# Patient Record
Sex: Male | Born: 1985
Health system: Southern US, Community
[De-identification: ages and names within clinical notes are randomized; demographics above are authoritative.]

## PROBLEM LIST (undated history)

## (undated) DIAGNOSIS — R7303 Prediabetes: Secondary | ICD-10-CM

## (undated) DIAGNOSIS — I10 Essential (primary) hypertension: Secondary | ICD-10-CM

## (undated) DIAGNOSIS — F17200 Nicotine dependence, unspecified, uncomplicated: Secondary | ICD-10-CM

## (undated) DIAGNOSIS — G4730 Sleep apnea, unspecified: Secondary | ICD-10-CM

## (undated) DIAGNOSIS — E119 Type 2 diabetes mellitus without complications: Secondary | ICD-10-CM

## (undated) DIAGNOSIS — E785 Hyperlipidemia, unspecified: Secondary | ICD-10-CM

## (undated) DIAGNOSIS — I4891 Unspecified atrial fibrillation: Secondary | ICD-10-CM

## (undated) DIAGNOSIS — J301 Allergic rhinitis due to pollen: Secondary | ICD-10-CM

## (undated) DIAGNOSIS — R7301 Impaired fasting glucose: Secondary | ICD-10-CM

## (undated) DIAGNOSIS — G4733 Obstructive sleep apnea (adult) (pediatric): Secondary | ICD-10-CM

## (undated) DIAGNOSIS — E669 Obesity, unspecified: Secondary | ICD-10-CM

## (undated) DIAGNOSIS — J329 Chronic sinusitis, unspecified: Secondary | ICD-10-CM

## (undated) DIAGNOSIS — F32A Depression, unspecified: Secondary | ICD-10-CM

## (undated) DIAGNOSIS — F329 Major depressive disorder, single episode, unspecified: Secondary | ICD-10-CM

## (undated) DIAGNOSIS — S022XXA Fracture of nasal bones, initial encounter for closed fracture: Secondary | ICD-10-CM

## (undated) DIAGNOSIS — B1920 Unspecified viral hepatitis C without hepatic coma: Secondary | ICD-10-CM

## (undated) DIAGNOSIS — J309 Allergic rhinitis, unspecified: Secondary | ICD-10-CM

## (undated) DIAGNOSIS — R040 Epistaxis: Secondary | ICD-10-CM

## (undated) DIAGNOSIS — R519 Headache, unspecified: Secondary | ICD-10-CM

## (undated) HISTORY — DX: Hyperlipidemia, unspecified: E78.5

## (undated) HISTORY — DX: Type 2 diabetes mellitus without complications: E11.9

## (undated) HISTORY — DX: Obesity, unspecified: E66.9

## (undated) HISTORY — DX: Unspecified atrial fibrillation: I48.91

## (undated) HISTORY — DX: Allergic rhinitis due to pollen: J30.1

## (undated) HISTORY — PX: BREAST SURGERY: SHX581

## (undated) HISTORY — DX: Nicotine dependence, unspecified, uncomplicated: F17.200

## (undated) HISTORY — DX: Essential (primary) hypertension: I10

## (undated) HISTORY — DX: Impaired fasting glucose: R73.01

## (undated) HISTORY — DX: Sleep apnea, unspecified: G47.30

## (undated) HISTORY — DX: Obstructive sleep apnea (adult) (pediatric): G47.33

## (undated) HISTORY — DX: Depression, unspecified: F32.A

## (undated) HISTORY — DX: Chronic sinusitis, unspecified: J32.9

## (undated) HISTORY — PX: HX HAND SURGERY: 2100001299

## (undated) HISTORY — DX: Headache, unspecified: R51.9

## (undated) HISTORY — DX: Allergic rhinitis, unspecified: J30.9

## (undated) HISTORY — DX: Fracture of nasal bones, initial encounter for closed fracture: S02.2XXA

## (undated) HISTORY — DX: Epistaxis: R04.0

---
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
## 2007-05-25 ENCOUNTER — Emergency Department (HOSPITAL_COMMUNITY): Admission: EM | Admit: 2007-05-25 | Discharge: 2007-05-25 | Payer: Self-pay | Admitting: Emergency Medicine

## 2007-08-04 ENCOUNTER — Ambulatory Visit: Payer: Self-pay | Admitting: Family Medicine

## 2007-10-17 ENCOUNTER — Ambulatory Visit: Payer: Self-pay | Admitting: Family Medicine

## 2007-11-03 ENCOUNTER — Ambulatory Visit: Payer: Self-pay | Admitting: Family Medicine

## 2007-12-05 ENCOUNTER — Ambulatory Visit: Payer: Self-pay | Admitting: Family Medicine

## 2008-02-21 ENCOUNTER — Ambulatory Visit: Payer: Self-pay | Admitting: Family Medicine

## 2008-03-19 ENCOUNTER — Ambulatory Visit: Payer: Self-pay | Admitting: Family Medicine

## 2008-05-17 ENCOUNTER — Ambulatory Visit: Payer: Self-pay | Admitting: Family Medicine

## 2008-07-10 ENCOUNTER — Ambulatory Visit: Payer: Self-pay | Admitting: Family Medicine

## 2008-07-15 ENCOUNTER — Ambulatory Visit: Payer: Self-pay | Admitting: Family Medicine

## 2008-08-28 ENCOUNTER — Ambulatory Visit: Payer: Self-pay | Admitting: Family Medicine

## 2008-12-06 ENCOUNTER — Ambulatory Visit: Payer: Self-pay | Admitting: Family Medicine

## 2009-01-30 ENCOUNTER — Ambulatory Visit: Payer: Self-pay | Admitting: Family Medicine

## 2009-04-18 ENCOUNTER — Ambulatory Visit: Payer: Self-pay | Admitting: Family Medicine

## 2009-07-03 ENCOUNTER — Ambulatory Visit: Payer: Self-pay | Admitting: Family Medicine

## 2009-07-09 ENCOUNTER — Ambulatory Visit: Payer: Self-pay | Admitting: Family Medicine

## 2009-11-06 ENCOUNTER — Ambulatory Visit: Payer: Self-pay | Admitting: Family Medicine

## 2009-12-19 ENCOUNTER — Ambulatory Visit: Payer: Self-pay | Admitting: Family Medicine

## 2010-01-23 ENCOUNTER — Ambulatory Visit
Admission: RE | Admit: 2010-01-23 | Discharge: 2010-01-23 | Payer: Self-pay | Source: Home / Self Care | Attending: Family Medicine | Admitting: Family Medicine

## 2010-02-24 ENCOUNTER — Ambulatory Visit (INDEPENDENT_AMBULATORY_CARE_PROVIDER_SITE_OTHER): Payer: 59 | Admitting: Family Medicine

## 2010-02-24 DIAGNOSIS — I10 Essential (primary) hypertension: Secondary | ICD-10-CM

## 2010-02-24 DIAGNOSIS — Z79899 Other long term (current) drug therapy: Secondary | ICD-10-CM

## 2010-03-25 ENCOUNTER — Ambulatory Visit: Payer: 59 | Admitting: Family Medicine

## 2010-03-26 ENCOUNTER — Ambulatory Visit (INDEPENDENT_AMBULATORY_CARE_PROVIDER_SITE_OTHER): Payer: 59 | Admitting: Family Medicine

## 2010-03-26 DIAGNOSIS — E669 Obesity, unspecified: Secondary | ICD-10-CM

## 2010-03-26 DIAGNOSIS — I10 Essential (primary) hypertension: Secondary | ICD-10-CM

## 2010-03-26 DIAGNOSIS — Z79899 Other long term (current) drug therapy: Secondary | ICD-10-CM

## 2010-05-01 ENCOUNTER — Encounter: Payer: Self-pay | Admitting: Family Medicine

## 2010-05-19 ENCOUNTER — Other Ambulatory Visit: Payer: Self-pay

## 2010-05-19 NOTE — Telephone Encounter (Signed)
Called [facility] 20/12.5 #30 with 5 refills

## 2010-05-25 ENCOUNTER — Ambulatory Visit (INDEPENDENT_AMBULATORY_CARE_PROVIDER_SITE_OTHER): Payer: 59 | Admitting: Family Medicine

## 2010-05-25 ENCOUNTER — Encounter: Payer: Self-pay | Admitting: Family Medicine

## 2010-05-25 DIAGNOSIS — E785 Hyperlipidemia, unspecified: Secondary | ICD-10-CM

## 2010-05-25 DIAGNOSIS — E6609 Other obesity due to excess calories: Secondary | ICD-10-CM

## 2010-05-25 DIAGNOSIS — I10 Essential (primary) hypertension: Secondary | ICD-10-CM

## 2010-05-25 DIAGNOSIS — E669 Obesity, unspecified: Secondary | ICD-10-CM

## 2010-05-25 DIAGNOSIS — I152 Hypertension secondary to endocrine disorders: Secondary | ICD-10-CM | POA: Insufficient documentation

## 2010-05-25 HISTORY — DX: Hyperlipidemia, unspecified: E78.5

## 2010-05-25 MED ORDER — FLUTICASONE PROPIONATE 50 MCG/ACT NA SUSP: 2.0000 | Freq: Every day | NASAL | Status: DC

## 2010-05-25 MED ORDER — NEBIVOLOL HCL 20 MG PO TABS: 2.0000 | ORAL_TABLET | ORAL | Status: DC

## 2010-05-25 NOTE — Progress Notes (Signed)
  Subjective:    Patient ID: Jesse Duncan, male    DOB: 1985/07/24, 25 y.o.   MRN: [id number]  HPI he is here for a blood pressure check. He also complains of a one-month history of nasal congestion and runny nose. No sneezing, itchy watery eyes or runny nose. No earache, sore throat, fever or chills. He has been using an over-the-counter nasal spray. He does get this every year but usually has other symptoms with it.    Review of Systems     Objective:   Physical Exam  Constitutional: He appears well-developed and well-nourished.  HENT:  Head: Normocephalic and atraumatic.  Right Ear: External ear normal.  Left Ear: External ear normal.  Nose: Nose normal.  Eyes: Conjunctivae and EOM are normal. Pupils are equal, round, and reactive to light. Left eye exhibits no discharge. No scleral icterus.  Neck: Neck supple. No thyromegaly present.  Cardiovascular: Normal rate, regular rhythm and normal heart sounds.  Exam reveals no gallop and no friction rub.   No murmur heard. Pulmonary/Chest: Effort normal and breath sounds normal.  Lymphadenopathy:    He has no cervical adenopathy.          Assessment & Plan:  Hypertension. Allergic rhinitis. Increase Bystolic to 40 mg. He is to stop using the over-the-counter nasal decongestant and switch to Flonase. Recheck blood pressure here in one month.

## 2010-05-25 NOTE — Patient Instructions (Addendum)
Take 2 of the Bystolic per day and recheck here in one month. Stop using the over-the-counter nasal spray and switch to Flonase.

## 2010-07-06 ENCOUNTER — Ambulatory Visit (INDEPENDENT_AMBULATORY_CARE_PROVIDER_SITE_OTHER): Payer: 59 | Admitting: Family Medicine

## 2010-07-06 ENCOUNTER — Encounter: Payer: Self-pay | Admitting: Family Medicine

## 2010-07-06 VITALS — BP 128/86 | HR 72 | Wt 300.0 lb

## 2010-07-06 DIAGNOSIS — J301 Allergic rhinitis due to pollen: Secondary | ICD-10-CM

## 2010-07-06 DIAGNOSIS — Z209 Contact with and (suspected) exposure to unspecified communicable disease: Secondary | ICD-10-CM

## 2010-07-06 DIAGNOSIS — I10 Essential (primary) hypertension: Secondary | ICD-10-CM

## 2010-07-06 DIAGNOSIS — E785 Hyperlipidemia, unspecified: Secondary | ICD-10-CM

## 2010-07-06 HISTORY — DX: Allergic rhinitis due to pollen: J30.1

## 2010-07-06 MED ORDER — LISINOPRIL-HYDROCHLOROTHIAZIDE 20-12.5 MG PO TABS: 1.0000 | ORAL_TABLET | Freq: Every day | ORAL | Status: DC

## 2010-07-06 MED ORDER — AMLODIPINE BESYLATE 10 MG PO TABS: 10.0000 mg | ORAL_TABLET | Freq: Every day | ORAL | Status: DC

## 2010-07-06 MED ORDER — FLUTICASONE PROPIONATE 50 MCG/ACT NA SUSP: 2.0000 | Freq: Every day | NASAL | Status: DC

## 2010-07-06 MED ORDER — PRAVASTATIN SODIUM 40 MG PO TABS: 40.0000 mg | ORAL_TABLET | Freq: Every day | ORAL | Status: DC

## 2010-07-06 NOTE — Progress Notes (Signed)
  Subjective:    Patient ID: Jesse Duncan, male    DOB: [birth year], 25 y.o.   MRN: [id number]  HPI He is here for a recheck. He continues on medications listed in the chart and does need several of them renewed. He continues to work on his weight but has been relatively unsuccessful with this. He also has concern over STD and states that on one occasion he did not use a condom. He continues on his allergy meds but does not use this regularly.  Review of Systems     Objective:   Physical Exam    alert and in no distress. Blood pressure is recorded.    Assessment & Plan:  Hypertension. Obesity. Allergic rhinitis. Concern for STD. I will check an HIV at his request. He will continue on his present medications. Several of them were called in. He will followup with me in roughly 6 months.

## 2010-07-06 NOTE — Patient Instructions (Signed)
Stay on your medications and continue to work on weight reduction

## 2010-07-07 ENCOUNTER — Telehealth: Payer: Self-pay

## 2010-07-07 LAB — HIV ANTIBODY (ROUTINE TESTING W REFLEX): HIV: NONREACTIVE

## 2010-07-07 NOTE — Telephone Encounter (Signed)
Pt informed labs were neg.

## 2010-07-07 NOTE — Telephone Encounter (Signed)
Left message for pt to call me back 

## 2010-12-07 ENCOUNTER — Encounter: Payer: Self-pay | Admitting: Family Medicine

## 2010-12-07 ENCOUNTER — Ambulatory Visit (INDEPENDENT_AMBULATORY_CARE_PROVIDER_SITE_OTHER): Payer: 59 | Admitting: Family Medicine

## 2010-12-07 DIAGNOSIS — I10 Essential (primary) hypertension: Secondary | ICD-10-CM

## 2010-12-07 DIAGNOSIS — E669 Obesity, unspecified: Secondary | ICD-10-CM

## 2010-12-07 DIAGNOSIS — E6609 Other obesity due to excess calories: Secondary | ICD-10-CM

## 2010-12-07 MED ORDER — NEBIVOLOL HCL 20 MG PO TABS: 2.0000 | ORAL_TABLET | ORAL | Status: DC

## 2010-12-07 NOTE — Progress Notes (Signed)
  Subjective:    Patient ID: Jesse Duncan, male    DOB: 12-20-1985, 26 y.o.   MRN: [id number]  HPI He is here for a recheck. He continues to work full-time and part-time job. He has not made any dietary changes. Continues on medications listed in the chart.   Review of Systems     Objective:   Physical Exam Alert and in no distress. Blood pressure is recorded.       Assessment & Plan:   1. Exogenous obesity   2. Hypertension    renew his medications.Discussed the need for him to make dietary changes as well as increase his physical activity

## 2010-12-07 NOTE — Patient Instructions (Signed)
Keep working on [employer] and exercise. Make adjustments in the white foods

## 2011-08-20 ENCOUNTER — Other Ambulatory Visit: Payer: Self-pay | Admitting: Family Medicine

## 2011-08-20 NOTE — Telephone Encounter (Signed)
PATIENT NEEDS TO SCHEDULE AN APPOINTMENT/ NO MORE REFILLS UNTIL THE OFFICE VISIT.

## 2011-09-07 ENCOUNTER — Telehealth: Payer: Self-pay | Admitting: Internal Medicine

## 2011-09-07 MED ORDER — NEBIVOLOL HCL 20 MG PO TABS: 2.0000 | ORAL_TABLET | ORAL | Status: DC

## 2011-09-07 NOTE — Telephone Encounter (Signed)
SENT MED IN BYSTOLIC

## 2011-10-27 ENCOUNTER — Ambulatory Visit (INDEPENDENT_AMBULATORY_CARE_PROVIDER_SITE_OTHER): Payer: 59 | Admitting: Family Medicine

## 2011-10-27 ENCOUNTER — Other Ambulatory Visit: Payer: Self-pay | Admitting: Family Medicine

## 2011-10-27 ENCOUNTER — Encounter: Payer: Self-pay | Admitting: Family Medicine

## 2011-10-27 VITALS — BP 124/82 | HR 60 | Ht 73.0 in | Wt 315.0 lb

## 2011-10-27 DIAGNOSIS — Z0000 Encounter for general adult medical examination without abnormal findings: Secondary | ICD-10-CM

## 2011-10-27 DIAGNOSIS — E785 Hyperlipidemia, unspecified: Secondary | ICD-10-CM

## 2011-10-27 DIAGNOSIS — J301 Allergic rhinitis due to pollen: Secondary | ICD-10-CM

## 2011-10-27 DIAGNOSIS — E6609 Other obesity due to excess calories: Secondary | ICD-10-CM

## 2011-10-27 DIAGNOSIS — E669 Obesity, unspecified: Secondary | ICD-10-CM

## 2011-10-27 DIAGNOSIS — R5383 Other fatigue: Secondary | ICD-10-CM

## 2011-10-27 DIAGNOSIS — R5381 Other malaise: Secondary | ICD-10-CM

## 2011-10-27 DIAGNOSIS — Z23 Encounter for immunization: Secondary | ICD-10-CM

## 2011-10-27 DIAGNOSIS — R6882 Decreased libido: Secondary | ICD-10-CM

## 2011-10-27 DIAGNOSIS — I10 Essential (primary) hypertension: Secondary | ICD-10-CM

## 2011-10-27 LAB — CBC WITH DIFFERENTIAL/PLATELET
Eosinophils Absolute: 0.2 10*3/uL (ref 0.0–0.7)
Hemoglobin: 14.1 g/dL (ref 13.0–17.0)
Lymphs Abs: 2 10*3/uL (ref 0.7–4.0)
MCH: 26.6 pg (ref 26.0–34.0)
MCV: 80.8 fL (ref 78.0–100.0)
Monocytes Relative: 8 % (ref 3–12)
Neutrophils Relative %: 33 % — ABNORMAL LOW (ref 43–77)
RBC: 5.31 MIL/uL (ref 4.22–5.81)

## 2011-10-27 NOTE — Progress Notes (Signed)
Subjective:    Patient ID: Jesse Duncan, male    DOB: 1985/05/17, 26 y.o.   MRN: [id number]  HPI He is here for complete examination. He continues on medications listed in the chart. He has no difficulty with these medications. His main complaint today is dealing with decreased libido as well as some fatigue. He is interested in getting an appetite suppressant. He is quite physically active but has made no changes in his eating habits. His allergies seem to be under good control and bother him mainly spring and fall. 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 He does have a 82-year-old daughter. This the family and social history was reviewed.   Review of Systems  Constitutional: Positive for fatigue.  HENT: Negative.   Eyes: Negative.   Respiratory: Negative.   Cardiovascular: Negative.   Gastrointestinal: Negative.   Genitourinary: Negative.   Neurological: Negative.   Hematological: Negative.        Objective:   Physical Exam BP 124/82  Pulse 60  Ht 6\' 1"  (1.854 m)  Wt 315 lb (142.883 kg)  BMI 41.56 kg/m2  SpO2 98%  General Appearance:    Alert, cooperative, no distress, appears stated age  Head:    Normocephalic, without obvious abnormality, atraumatic  Eyes:    PERRL, conjunctiva/corneas clear, EOM's intact, fundi    benign  Ears:    Normal TM's and external ear canals  Nose:   Nares normal, mucosa normal, no drainage or sinus   tenderness  Throat:   Lips, mucosa, and tongue normal; teeth and gums normal  Neck:   Supple, no lymphadenopathy;  thyroid:  no   enlargement/tenderness/nodules; no carotid   bruit or JVD  Back:    Spine nontender, no curvature, ROM normal, no CVA     tenderness  Lungs:     Clear to auscultation bilaterally without wheezes, rales or     ronchi; respirations unlabored  Chest Wall:    No tenderness or deformity   Heart:    Regular rate and rhythm, S1 and S2 normal, no murmur, rub   or gallop  Breast  Exam:    No chest wall tenderness, masses or gynecomastia  Abdomen:     Soft, non-tender, nondistended, normoactive bowel sounds,    no masses, no hepatosplenomegaly  Genitalia:    Normal male external genitalia without lesions.  Testicles without masses.  No inguinal hernias.  Rectal:   Deferred due to age <40 and lack of symptoms  Extremities:   No clubbing, cyanosis or edema  Pulses:   2+ and symmetric all extremities  Skin:   Skin color, texture, turgor normal, no rashes or lesions  Lymph nodes:   Cervical, supraclavicular, and axillary nodes normal  Neurologic:   CNII-XII intact, normal strength, sensation and gait; reflexes 2+ and symmetric throughout          Psych:   Normal mood, affect, hygiene and grooming.           Assessment & Plan:   1. Routine general medical examination at a health care facility  Flu vaccine greater than or equal to 3yo preservative free IM  2. Hypertension  CBC with Differential, Comprehensive metabolic panel  3. Hyperlipidemia  Lipid panel  4. Exogenous obesity  Lipid panel  5. Allergic rhinitis due to pollen    6. Fatigue  TSH  7. Libido, decreased  Testosterone  8. Need for prophylactic vaccination and inoculation against  influenza     continue present medication regimen. I discussed lifestyle modification in regard to diet in particular since his activity level seems to be fairly high. Discussed cutting back on carbohydrates specifically white foods. We'll consider giving him an appetite suppressant at a later date but would really like and work heavily on making dietary changes. He did express understanding of this. Also followup on his fatigue and decreased libido.  Flu shot given with discussion of risks and benefits

## 2011-10-27 NOTE — Patient Instructions (Addendum)
Cut back on "white food". Come back down the road if you still need help after you've made some changes

## 2011-10-28 LAB — COMPREHENSIVE METABOLIC PANEL
Albumin: 4.5 g/dL (ref 3.5–5.2)
CO2: 27 mEq/L (ref 19–32)
Calcium: 9.6 mg/dL (ref 8.4–10.5)
Chloride: 104 mEq/L (ref 96–112)
Glucose, Bld: 88 mg/dL (ref 70–99)
Sodium: 139 mEq/L (ref 135–145)
Total Bilirubin: 0.7 mg/dL (ref 0.3–1.2)
Total Protein: 7.7 g/dL (ref 6.0–8.3)

## 2011-10-28 LAB — LIPID PANEL
Cholesterol: 251 mg/dL — ABNORMAL HIGH (ref 0–200)
VLDL: 39 mg/dL (ref 0–40)

## 2011-10-28 LAB — TSH: TSH: 1.514 u[IU]/mL (ref 0.350–4.500)

## 2011-10-28 LAB — FSH/LH
FSH: 3.4 m[IU]/mL (ref 1.4–18.1)
LH: 3.9 m[IU]/mL (ref 1.5–9.3)

## 2011-10-28 NOTE — Progress Notes (Signed)
Quick Note:  Have him set up an appointment so we can discuss the results of his blood work ______

## 2011-11-01 ENCOUNTER — Ambulatory Visit (INDEPENDENT_AMBULATORY_CARE_PROVIDER_SITE_OTHER): Payer: 59 | Admitting: Family Medicine

## 2011-11-01 ENCOUNTER — Other Ambulatory Visit: Payer: Self-pay

## 2011-11-01 ENCOUNTER — Encounter: Payer: Self-pay | Admitting: Family Medicine

## 2011-11-01 DIAGNOSIS — E291 Testicular hypofunction: Secondary | ICD-10-CM

## 2011-11-01 DIAGNOSIS — R7989 Other specified abnormal findings of blood chemistry: Secondary | ICD-10-CM

## 2011-11-01 MED ORDER — LISINOPRIL-HYDROCHLOROTHIAZIDE 20-12.5 MG PO TABS: 1.0000 | ORAL_TABLET | Freq: Every day | ORAL | Status: DC

## 2011-11-01 MED ORDER — AMLODIPINE BESYLATE 10 MG PO TABS: 10.0000 mg | ORAL_TABLET | Freq: Every day | ORAL | Status: DC

## 2011-11-01 MED ORDER — NEBIVOLOL HCL 20 MG PO TABS: 2.0000 | ORAL_TABLET | ORAL | Status: DC

## 2011-11-01 MED ORDER — PRAVASTATIN SODIUM 40 MG PO TABS: 40.0000 mg | ORAL_TABLET | Freq: Every day | ORAL | Status: DC

## 2011-11-01 NOTE — Telephone Encounter (Signed)
Ordered pt med

## 2011-11-01 NOTE — Progress Notes (Signed)
  Subjective:    Patient ID: Jesse Duncan, male    DOB: 1985-11-01, 26 y.o.   MRN: [id number]  HPI He is here for consultation. Recent blood work did show low testosterone however pituitary evaluation was negative. He does have a previous history of low libido as well as energy and stamina.   Review of Systems     Objective:   Physical Exam Alert and in no distress otherwise not examined.       Assessment & Plan:   1. Low testosterone  Testosterone   I discussed the low testosterone in regard to pituitary function as well as his weight. Explained that this easily could all be related to weight and I strongly encouraged him to make diet and exercise changes. I will also check his testosterone level again.

## 2011-11-02 NOTE — Progress Notes (Signed)
Quick Note:  Let him know that his testosterone is again low. Have him return here to discuss testosterone supplementation. ______

## 2011-11-04 NOTE — Progress Notes (Signed)
Quick Note:  Called pt left message word for word on pt # to make appt to discuss labs ______

## 2011-11-09 ENCOUNTER — Ambulatory Visit (INDEPENDENT_AMBULATORY_CARE_PROVIDER_SITE_OTHER): Payer: 59 | Admitting: Family Medicine

## 2011-11-09 DIAGNOSIS — E669 Obesity, unspecified: Secondary | ICD-10-CM

## 2011-11-09 DIAGNOSIS — E291 Testicular hypofunction: Secondary | ICD-10-CM

## 2011-11-09 DIAGNOSIS — E6609 Other obesity due to excess calories: Secondary | ICD-10-CM

## 2011-11-09 MED ORDER — TESTOSTERONE 50 MG/5GM (1%) TD GEL: 5.0000 g | Freq: Every day | TRANSDERMAL | Status: DC

## 2011-11-09 NOTE — Patient Instructions (Signed)
After you have the appointment with the dietitian call me and I will give you a diet pill

## 2011-11-09 NOTE — Progress Notes (Signed)
  Subjective:    Patient ID: Jesse Duncan, male    DOB: Jan 20, 1985, 26 y.o.   MRN: [id number]  HPI He is here for consultation concerning recent testosterone levels. 2 were drawn as well as evaluation of his pituitary status. All this was negative. His main risk factor is his weight. I also discussed this with urology and they agreed that no further workup was necessary.   Review of Systems     Objective:   Physical Exam  Alert and in no distress otherwise not examined      Assessment & Plan:   1. Hypogonadism male  Amb ref to Medical Nutrition Therapy-MNT  2. Exogenous obesity  Amb ref to Medical Nutrition Therapy-MNT   I will place him on Testim. Discussed how to use this. Also discussed the need for him to lose weight. Will refer to dietitian. He would like a diet pill. I said that I would give him one after he has been to the dietitian clearly stating that this is a care and that long-term use of this would not be appropriate. Recheck here one month to

## 2011-11-11 ENCOUNTER — Telehealth: Payer: Self-pay | Admitting: Internal Medicine

## 2011-11-11 MED ORDER — LISINOPRIL-HYDROCHLOROTHIAZIDE 20-12.5 MG PO TABS: 1.0000 | ORAL_TABLET | Freq: Every day | ORAL | Status: DC

## 2011-11-11 MED ORDER — AMLODIPINE BESYLATE 10 MG PO TABS: 10.0000 mg | ORAL_TABLET | Freq: Every day | ORAL | Status: DC

## 2011-11-11 MED ORDER — PRAVASTATIN SODIUM 40 MG PO TABS: 40.0000 mg | ORAL_TABLET | Freq: Every day | ORAL | Status: DC

## 2011-11-11 MED ORDER — TESTOSTERONE 50 MG/5GM (1%) TD GEL: 5.0000 g | Freq: Every day | TRANSDERMAL | Status: DC

## 2011-11-11 MED ORDER — NEBIVOLOL HCL 20 MG PO TABS: 2.0000 | ORAL_TABLET | ORAL | Status: DC

## 2011-11-11 NOTE — Telephone Encounter (Signed)
[facility]

## 2011-11-15 ENCOUNTER — Telehealth: Payer: Self-pay | Admitting: Family Medicine

## 2011-11-18 ENCOUNTER — Telehealth: Payer: Self-pay | Admitting: Family Medicine

## 2011-11-18 MED ORDER — TESTOSTERONE 50 MG/5GM (1%) TD GEL: 5.0000 g | Freq: Every day | TRANSDERMAL | Status: DC

## 2011-11-18 NOTE — Telephone Encounter (Signed)
Call in the Testim. Have him set up an appointment to see me in about one month to get this checked

## 2011-11-18 NOTE — Telephone Encounter (Signed)
There is really only one test them. If the 1% use one tube per day

## 2011-11-18 NOTE — Telephone Encounter (Signed)
Not sure what testim you want me to call out for this pt. Is it testim 50mg /5gm? How does pt use it?

## 2011-11-18 NOTE — Telephone Encounter (Signed)
lm

## 2011-11-18 NOTE — Telephone Encounter (Signed)
[facility]im 150g [facility] and notified pt and pt is coming in December 5th,2013

## 2011-11-26 ENCOUNTER — Encounter: Payer: Self-pay | Admitting: Internal Medicine

## 2011-12-09 ENCOUNTER — Ambulatory Visit: Payer: 59 | Admitting: Family Medicine

## 2011-12-17 ENCOUNTER — Ambulatory Visit (INDEPENDENT_AMBULATORY_CARE_PROVIDER_SITE_OTHER): Payer: 59 | Admitting: Family Medicine

## 2011-12-17 ENCOUNTER — Encounter: Payer: Self-pay | Admitting: Family Medicine

## 2011-12-17 VITALS — BP 136/90 | HR 72 | Wt 313.0 lb

## 2011-12-17 DIAGNOSIS — E291 Testicular hypofunction: Secondary | ICD-10-CM

## 2011-12-17 NOTE — Progress Notes (Signed)
  Subjective:    Patient ID: Jesse Duncan, male    DOB: 1985/01/31, 26 y.o.   MRN: [id number]  HPI He is here for recheck. He does note an improvement in his energy and stamina but has not noted any libido changes. He has noted no problems using the cream.  Review of Systems     Objective:   Physical Exam Alert and in no distress otherwise not examined       Assessment & Plan:   1. Hypogonadism male  Testosterone

## 2011-12-18 LAB — TESTOSTERONE: Testosterone: 389.68 ng/dL (ref 300–890)

## 2011-12-19 ENCOUNTER — Emergency Department
Admission: EM | Admit: 2011-12-19 | Discharge: 2011-12-19 | Disposition: A | Discharge status: Home / Self Care | Payer: Self-pay | Attending: Emergency Medicine | Admitting: Emergency Medicine

## 2011-12-19 ENCOUNTER — Encounter (HOSPITAL_COMMUNITY): Payer: Self-pay

## 2011-12-19 ENCOUNTER — Emergency Department (EMERGENCY_DEPARTMENT_HOSPITAL): Payer: Self-pay

## 2011-12-19 DIAGNOSIS — F17200 Nicotine dependence, unspecified, uncomplicated: Secondary | ICD-10-CM | POA: Insufficient documentation

## 2011-12-19 MED ORDER — TRAMADOL 50 MG TABLET
100.00 mg | ORAL_TABLET | ORAL | Status: DC
Start: 2011-12-19 — End: 2011-12-19

## 2011-12-19 NOTE — Progress Notes (Signed)
Quick Note:  Testosterone is much better . Continue on present dosing ______
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
## 2011-12-19 NOTE — ED Nurses Note (Signed)
Patient discharged.  Information given.  Crutches and script given.  No further questions.

## 2011-12-19 NOTE — Discharge Instructions (Signed)
Tramadol tablets  What is this medicine?  TRAMADOL (TRA ma dole) is a pain reliever. It is used to treat moderate to severe pain in adults.  This medicine may be used for other purposes; ask your health care provider or pharmacist if you have questions.  What should I tell my health care provider before I take this medicine?  They need to know if you have any of these conditions:  -brain tumor  -depression  -drug abuse or addiction  -head injury  -if you frequently drink alcohol containing drinks  -kidney disease or trouble passing urine  -liver disease  -lung disease, asthma, or breathing problems  -seizures or epilepsy  -suicidal thoughts, plans, or attempt; a previous suicide attempt by you or a family member  -an unusual or allergic reaction to tramadol, codeine, other medicines, foods, dyes, or preservatives  -pregnant or trying to get pregnant  -breast-feeding  How should I use this medicine?  Take this medicine by mouth with a full glass of water. Follow the directions on the prescription label. If the medicine upsets your stomach, take it with food or milk. Do not take more medicine than you are told to take.  Talk to your pediatrician regarding the use of this medicine in children. Special care may be needed.  Overdosage: If you think you have taken too much of this medicine contact a poison control center or emergency room at once.  NOTE: This medicine is only for you. Do not share this medicine with others.  What if I miss a dose?  If you miss a dose, take it as soon as you can. If it is almost time for your next dose, take only that dose. Do not take double or extra doses.  What may interact with this medicine?  Do not take this medicine with any of the following medications:  -MAOIs like Carbex, Eldepryl, Marplan, Nardil, and Parnate  This medicine may also interact with the following medications:  -alcohol or medicines that contain alcohol  -antihistamines  -benzodiazepines  -bupropion  -carbamazepine  or oxcarbazepine  -clozapine  -cyclobenzaprine  -digoxin  -furazolidone  -linezolid  -medicines for depression, anxiety, or psychotic disturbances  -medicines for migraine headache like almotriptan, eletriptan, frovatriptan, naratriptan, rizatriptan, sumatriptan, zolmitriptan  -medicines for pain like pentazocine, buprenorphine, butorphanol, meperidine, nalbuphine, and propoxyphene  -medicines for sleep  -muscle relaxants  -naltrexone  -phenobarbital  -phenothiazines like perphenazine, thioridazine, chlorpromazine, mesoridazine, fluphenazine, prochlorperazine, promazine, and trifluoperazine  -procarbazine  -warfarin  This list may not describe all possible interactions. Give your health care provider a list of all the medicines, herbs, non-prescription drugs, or dietary supplements you use. Also tell them if you smoke, drink alcohol, or use illegal drugs. Some items may interact with your medicine.  What should I watch for while using this medicine?  Tell your doctor or health care professional if your pain does not go away, if it gets worse, or if you have new or a different type of pain. You may develop tolerance to the medicine. Tolerance means that you will need a higher dose of the medicine for pain relief. Tolerance is normal and is expected if you take this medicine for a long time.  Do not suddenly stop taking your medicine because you may develop a severe reaction. Your body becomes used to the medicine. This does NOT mean you are addicted. Addiction is a behavior related to getting and using a drug for a non-medical reason. If you have pain, you have  a medical reason to take pain medicine. Your doctor will tell you how much medicine to take. If your doctor wants you to stop the medicine, the dose will be slowly lowered over time to avoid any side effects.  You may get drowsy or dizzy. Do not drive, use machinery, or do anything that needs mental alertness until you know how this medicine affects you. Do  not stand or sit up quickly, especially if you are an older patient. This reduces the risk of dizzy or fainting spells. Alcohol can increase or decrease the effects of this medicine. Avoid alcoholic drinks.  You may have constipation. Try to have a bowel movement at least every 2 to 3 days. If you do not have a bowel movement for 3 days, call your doctor or health care professional.  Your mouth may get dry. Chewing sugarless gum or sucking hard candy, and drinking plenty of water may help. Contact your doctor if the problem does not go away or is severe.  What side effects may I notice from receiving this medicine?  Side effects that you should report to your doctor or health care professional as soon as possible:  -allergic reactions like skin rash, itching or hives, swelling of the face, lips, or tongue  -breathing difficulties, wheezing  -confusion  -itching  -light headedness or fainting spells  -redness, blistering, peeling or loosening of the skin, including inside the mouth  -seizures  Side effects that usually do not require medical attention (report to your doctor or health care professional if they continue or are bothersome):  -constipation  -dizziness  -drowsiness  -headache  -nausea, vomiting  This list may not describe all possible side effects. Call your doctor for medical advice about side effects. You may report side effects to FDA at 1-800-FDA-1088.  Where should I keep my medicine?  Keep out of the reach of children.  Store at room temperature between 15 and 30 degrees C (59 and 86 degrees F). Keep container tightly closed. Throw away any unused medicine after the expiration date.  NOTE: This sheet is a summary. It may not cover all possible information. If you have questions about this medicine, talk to your doctor, pharmacist, or health care provider.   2013, [employer]. (09/03/2009 11:55:44 AM)  Knee Sprain  A knee sprain is a tear in one of the strong, fibrous tissues that connect  the bones (ligaments) in your knee. The severity of the sprain depends on how much of the ligament is torn. The tear can be either partial or complete.  CAUSES   Often, sprains are a result of a fall or injury. The force of the impact causes the fibers of your ligament to stretch too much. This excess tension causes the fibers of your ligament to tear.  SYMPTOMS   You may have some loss of motion in your knee. Other symptoms include:   Bruising.   Tenderness.   Swelling.  DIAGNOSIS   In order to diagnose knee sprain, your caregiver will physically examine your knee to determine how torn the ligament is. Your caregiver may also suggest an X-ray exam of your knee to make sure no bones are broken.  TREATMENT   If your ligament is only partially torn, treatment usually involves keeping the knee in a fixed position (immobilization) or bracing your knee for activities that require movement for several weeks. To do this, your caregiver will apply a bandage, cast, or splint to keep your knee from moving or  support your knee during movement until it heals. For a partially torn ligament, the healing process usually takes 4 to 6 weeks.  If your ligament is completely torn, depending on which ligament it is, you may need surgery to reconnect the ligament to the bone or reconstruct it. After surgery, a cast or splint may be applied and will need to stay on your knee for 4 to 6 weeks while your ligament heals.  HOME CARE INSTRUCTIONS   Keep your injured knee elevated to decrease swelling.   To ease pain and swelling, apply ice to your knee twice a day, for 2 to 3 days:   Put ice in a plastic bag.   Place a towel between your skin and the bag.   Leave the ice on for 15 minutes.   Only take over-the-counter or prescription medicine for pain as directed by your caregiver.   Do not leave your knee unprotected until pain and stiffness go away (usually 4 to 6 weeks).   Do not allow your cast or splint to get wet. If you have  been instructed not to remove it, cover your cast or splint with a plastic bag when you shower or bathe. Do not swim.   Your caregiver may suggest exercises for you to do during your recovery to prevent or limit permanent weakness and stiffness.  SEEK IMMEDIATE MEDICAL CARE IF:   Your cast or splint becomes damaged.   Your pain becomes worse.  MAKE SURE YOU:   Understand these instructions.   Will watch your condition.   Will get help right away if you are not doing well or get worse.  Document Released: 12/21/2004 Document Revised: 03/15/2011 Document Reviewed: 12/05/2010  [facility] Patient Information 2013 [place], [place].

## 2011-12-20 NOTE — Progress Notes (Signed)
Quick Note:  CALLED PT LEFT MESSAGE TESTOSTERONE IS MUCH BETTER ______

## 2011-12-24 ENCOUNTER — Ambulatory Visit (INDEPENDENT_AMBULATORY_CARE_PROVIDER_SITE_OTHER): Payer: Self-pay | Admitting: Emergency Medicine

## 2011-12-24 NOTE — Progress Notes (Signed)
 The patient did not appear for their appointment/or scheduled appointment was cancelled.  This office visit opened in error.

## 2012-01-18 ENCOUNTER — Other Ambulatory Visit: Payer: Self-pay | Admitting: Family Medicine

## 2012-02-09 ENCOUNTER — Encounter: Payer: 59 | Admitting: *Deleted

## 2012-04-10 ENCOUNTER — Emergency Department
Admission: EM | Admit: 2012-04-10 | Discharge: 2012-04-10 | Discharge status: Against Medical Advice | Payer: Self-pay | Attending: Emergency Medicine | Admitting: Emergency Medicine

## 2012-04-10 DIAGNOSIS — Z5320 Procedure and treatment not carried out because of patient's decision for unspecified reasons: Secondary | ICD-10-CM | POA: Insufficient documentation

## 2012-04-10 DIAGNOSIS — H9209 Otalgia, unspecified ear: Secondary | ICD-10-CM | POA: Insufficient documentation

## 2012-05-18 ENCOUNTER — Ambulatory Visit (INDEPENDENT_AMBULATORY_CARE_PROVIDER_SITE_OTHER): Payer: 59 | Admitting: Family Medicine

## 2012-05-18 ENCOUNTER — Encounter: Payer: Self-pay | Admitting: Family Medicine

## 2012-05-18 VITALS — BP 150/102 | HR 72 | Ht 73.0 in | Wt 319.0 lb

## 2012-05-18 DIAGNOSIS — J309 Allergic rhinitis, unspecified: Secondary | ICD-10-CM

## 2012-05-18 DIAGNOSIS — Z2089 Contact with and (suspected) exposure to other communicable diseases: Secondary | ICD-10-CM

## 2012-05-18 DIAGNOSIS — Z202 Contact with and (suspected) exposure to infections with a predominantly sexual mode of transmission: Secondary | ICD-10-CM

## 2012-05-18 MED ORDER — FLUTICASONE PROPIONATE 50 MCG/ACT NA SUSP: 2.0000 | Freq: Every day | NASAL | Status: DC

## 2012-05-18 MED ORDER — AZITHROMYCIN 500 MG PO TABS: 1000.0000 mg | ORAL_TABLET | Freq: Once | ORAL | Status: DC

## 2012-05-18 NOTE — Progress Notes (Signed)
Chief Complaint  Patient presents with  . Advice Only    male partner positive chlamydia, would like to be treated/tested.  Requesting refill on flonase and testim.   Patient had exposure to someone with chlamydia 2 weeks ago.  She apparently was asymptomatic, found on routine screening. He has many questions about this. He currently has 2 partners. He denies any dysuria, penile discharge, or other symptoms or complaints.  Uses condoms intermittently.  No h/o STD's in past, reports having had HIV test about a year ago.  Allergies are flaring, needs refill on Flonase.  Has been using it regularly, having recently started it back.  Also needing to use Allegra some.  HTN--hasn't taken his meds yet today.  Review of chart shows that he is due in June for med check with Dr. Susann Givens, none is scheduled.  Past Medical History  Diagnosis Date  . Hypertension   . Obesity   . Dyslipidemia    No past surgical history on file.  History   Social History  . Marital Status: Single    Spouse Name: N/A    Number of Children: N/A  . Years of Education: N/A   Occupational History  . Not on file.   Social History Main Topics  . Smoking status: Former Smoker -- 1 years    Types: Cigars    Quit date: 01/04/2010  . Smokeless tobacco: Never Used  . Alcohol Use: No  . Drug Use: No  . Sexually Active: Not Currently   Other Topics Concern  . Not on file   Social History Narrative  . No narrative on file   Current Outpatient Prescriptions on File Prior to Visit  Medication Sig Dispense Refill  . amLODipine (NORVASC) 10 MG tablet Take 1 tablet (10 mg total) by mouth daily.  30 tablet  5  . lisinopril-hydrochlorothiazide (PRINZIDE,ZESTORETIC) 20-12.5 MG per tablet Take 1 tablet by mouth daily.  30 tablet  5  . Nebivolol HCl (BYSTOLIC) 20 MG TABS Take 2 tablets (40 mg total) by mouth 1 day or 1 dose.  60 tablet  5  . pravastatin (PRAVACHOL) 40 MG tablet Take 1 tablet (40 mg total) by mouth daily.   30 tablet  5  . TESTIM 50 MG/5GM GEL PLACE DAILY ON SKIN  150 g  0   No current facility-administered medications on file prior to visit.   No Known Allergies  ROS:  Denies headache, chest pain, hematuria, abdominal pain, dysuria, penile lesions, discharge.  +congestion/allergies, stable.  Denies cough, shortness of breath, rashes.  PHYSICAL EXAM: BP 150/102  Pulse 72  Ht 6\' 1"  (1.854 m)  Wt 319 lb (144.697 kg)  BMI 42.1 kg/m2 Obese pleasant male in no distress HEENT: Nasal mucosa--moderately edematous, pale, clear mucus.  Sinuses nontender.  OP clear Neck: no lymphadenopathy Heart: regular rate and rhythm without murmur Lungs: clear GU--deferred/declined by pt.  Denies any sores, lesions, discharge Neuro: alert and oriented.  Normal gait, strength, cranial nerves grossly intact  ASSESSMENT/PLAN:  Exposure to chlamydia - Plan: azithromycin (ZITHROMAX) 500 MG tablet  Exposure to STD - Plan: HIV antibody, GC/Chlamydia Probe Amp, RPR  Allergic rhinitis, cause unspecified - Plan: fluticasone (FLONASE) 50 MCG/ACT nasal spray  Chlamydia exposure--treat presumptively with azithro 1 gram and do STD testing.  All questions were answered.  Regular condom use.  HTN--avoid decongestants.  Check BP periodically elsewhere.  Continue current medications, and make sure you take them the day you see Dr. Susann Givens, and bring list of  BP's checked at pharmacy.  Follow a low sodium diet.  Schedule med check with Dr. Toney Sang f/u HTN, hypogonadism, labs and med refills

## 2012-05-18 NOTE — Patient Instructions (Addendum)
  HTN--avoid decongestants.  Check BP periodically elsewhere.  Continue current medications, and make sure you take them the day you see Dr. Susann Givens, and bring list of BP's checked at pharmacy.  Follow a low sodium diet.  Your are due for a med check with Dr. Susann Givens to follow up on blood pressure, testosterone, etc.  Please schedule  Use condoms regularly to prevent sexually transmitted infections.

## 2012-05-19 LAB — RPR

## 2012-05-22 ENCOUNTER — Encounter: Payer: Self-pay | Admitting: Family Medicine

## 2012-05-25 ENCOUNTER — Ambulatory Visit: Payer: Self-pay | Admitting: Family Medicine

## 2012-07-03 ENCOUNTER — Encounter: Payer: 59 | Admitting: Family Medicine

## 2012-07-14 ENCOUNTER — Ambulatory Visit (INDEPENDENT_AMBULATORY_CARE_PROVIDER_SITE_OTHER): Payer: 59 | Admitting: Family Medicine

## 2012-07-14 VITALS — BP 170/100 | HR 75 | Wt 330.0 lb

## 2012-07-14 DIAGNOSIS — I10 Essential (primary) hypertension: Secondary | ICD-10-CM

## 2012-07-14 DIAGNOSIS — Z79899 Other long term (current) drug therapy: Secondary | ICD-10-CM

## 2012-07-14 DIAGNOSIS — E6609 Other obesity due to excess calories: Secondary | ICD-10-CM

## 2012-07-14 DIAGNOSIS — E785 Hyperlipidemia, unspecified: Secondary | ICD-10-CM

## 2012-07-14 DIAGNOSIS — J309 Allergic rhinitis, unspecified: Secondary | ICD-10-CM

## 2012-07-14 DIAGNOSIS — Z9119 Patient's noncompliance with other medical treatment and regimen: Secondary | ICD-10-CM

## 2012-07-14 MED ORDER — PRAVASTATIN SODIUM 40 MG PO TABS: 40.0000 mg | ORAL_TABLET | Freq: Every day | ORAL | Status: DC

## 2012-07-14 MED ORDER — LISINOPRIL-HYDROCHLOROTHIAZIDE 20-12.5 MG PO TABS: 1.0000 | ORAL_TABLET | Freq: Every day | ORAL | Status: DC

## 2012-07-14 MED ORDER — FLUTICASONE PROPIONATE 50 MCG/ACT NA SUSP: 2.0000 | Freq: Every day | NASAL | Status: DC

## 2012-07-14 MED ORDER — NEBIVOLOL HCL 20 MG PO TABS: 2.0000 | ORAL_TABLET | ORAL | Status: DC

## 2012-07-14 MED ORDER — AMLODIPINE BESYLATE 10 MG PO TABS: 10.0000 mg | ORAL_TABLET | Freq: Every day | ORAL | Status: DC

## 2012-07-14 NOTE — Progress Notes (Signed)
  Subjective:    Patient ID: Jesse Duncan, male    DOB: [birth year], 27 y.o.   MRN: [id number]  HPI He is here for recheck. He admits to not taking medications on any kind of a regular basis. His sleep habits are quite poor. He is working multiple jobs. He does not exercise regularly and usually eats fast foods. He admits to taking care of everyone else instead of himself. He is not taking the testosterone on a regular basis.   Review of Systems     Objective:   Physical Exam alert and in no distress. Tympanic membranes and canals are normal. Throat is clear. Tonsils are normal. Neck is supple without adenopathy or thyromegaly. Cardiac exam shows a regular sinus rhythm without murmurs or gallops. Lungs are clear to auscultation.        Assessment & Plan:  Personal history of noncompliance with medical treatment, presenting hazards to health  Exogenous obesity  Hyperlipidemia - Plan: pravastatin (PRAVACHOL) 40 MG tablet  Hypertension - Plan: amLODipine (NORVASC) 10 MG tablet, lisinopril-hydrochlorothiazide (PRINZIDE,ZESTORETIC) 20-12.5 MG per tablet, Nebivolol HCl (BYSTOLIC) 20 MG TABS  Encounter for long-term (current) use of other medications  Allergic rhinitis, cause unspecified - Plan: fluticasone (FLONASE) 50 MCG/ACT nasal spray  All of his medications were renewed. I had a extensive discussion with him concerning his present lifestyle and the fact that continuing to do this will shorten his life and increased risk of him dying young of stroke, heart failure etc. Strongly encouraged him to take better care of himself in regard to taking his medications on a regular basis. Recommend he return here at he stayed on his medications regularly for one month for followup and blood work.

## 2012-07-14 NOTE — Patient Instructions (Addendum)
You need to take time out for yourself. Take your medications regularly for a one-month solid then come back for a recheck your blood pressure

## 2012-08-24 ENCOUNTER — Ambulatory Visit (INDEPENDENT_AMBULATORY_CARE_PROVIDER_SITE_OTHER): Payer: 59 | Admitting: Family Medicine

## 2012-08-24 ENCOUNTER — Encounter: Payer: Self-pay | Admitting: Family Medicine

## 2012-08-24 VITALS — BP 138/90 | HR 60 | Wt 322.0 lb

## 2012-08-24 DIAGNOSIS — E291 Testicular hypofunction: Secondary | ICD-10-CM

## 2012-08-24 DIAGNOSIS — I10 Essential (primary) hypertension: Secondary | ICD-10-CM

## 2012-08-24 MED ORDER — AMLODIPINE BESYLATE 10 MG PO TABS: 10.0000 mg | ORAL_TABLET | Freq: Every day | ORAL | Status: DC

## 2012-08-24 MED ORDER — TESTOSTERONE 50 MG/5GM (1%) TD GEL: TRANSDERMAL | Status: DC

## 2012-08-24 MED ORDER — LISINOPRIL-HYDROCHLOROTHIAZIDE 20-12.5 MG PO TABS: 1.0000 | ORAL_TABLET | Freq: Every day | ORAL | Status: DC

## 2012-08-24 MED ORDER — NEBIVOLOL HCL 20 MG PO TABS: 2.0000 | ORAL_TABLET | ORAL | Status: DC

## 2012-08-24 NOTE — Progress Notes (Signed)
  Subjective:    Patient ID: Jesse Duncan, male    DOB: January 07, 1985, 27 y.o.   MRN: [id number]  HPI He is here for recheck. He has been taking his medications regularly. He has run out of testosterone and did note an improvement while he was on it. His work schedule will change which will allow him more time to get sleep.   Review of Systems     Objective:   Physical Exam Alert and in no distress. Pressure is recorded      Assessment & Plan:  Hypertension - Plan: amLODipine (NORVASC) 10 MG tablet, lisinopril-hydrochlorothiazide (PRINZIDE,ZESTORETIC) 20-12.5 MG per tablet, Nebivolol HCl (BYSTOLIC) 20 MG TABS  Hypogonadism male - Plan: testosterone (TESTIM) 50 MG/5GM GEL  all his medications were renewed. I will do blood work on him with his next visit in one month to include testosterone lipids CBC and Cmet.

## 2012-10-03 ENCOUNTER — Ambulatory Visit: Payer: 59 | Admitting: Family Medicine

## 2012-10-18 ENCOUNTER — Encounter: Payer: Self-pay | Admitting: Family Medicine

## 2012-10-18 ENCOUNTER — Ambulatory Visit (INDEPENDENT_AMBULATORY_CARE_PROVIDER_SITE_OTHER): Payer: 59 | Admitting: Family Medicine

## 2012-10-18 VITALS — BP 140/90 | HR 72 | Wt 319.0 lb

## 2012-10-18 DIAGNOSIS — E291 Testicular hypofunction: Secondary | ICD-10-CM

## 2012-10-18 DIAGNOSIS — E6609 Other obesity due to excess calories: Secondary | ICD-10-CM

## 2012-10-18 DIAGNOSIS — E669 Obesity, unspecified: Secondary | ICD-10-CM

## 2012-10-18 DIAGNOSIS — I10 Essential (primary) hypertension: Secondary | ICD-10-CM

## 2012-10-18 DIAGNOSIS — Z23 Encounter for immunization: Secondary | ICD-10-CM

## 2012-10-18 DIAGNOSIS — Z125 Encounter for screening for malignant neoplasm of prostate: Secondary | ICD-10-CM

## 2012-10-18 LAB — CBC WITH DIFFERENTIAL/PLATELET
Basophils Absolute: 0 10*3/uL (ref 0.0–0.1)
Basophils Relative: 0 % (ref 0–1)
Hemoglobin: 13.1 g/dL (ref 13.0–17.0)
Lymphocytes Relative: 45 % (ref 12–46)
Lymphs Abs: 2.6 10*3/uL (ref 0.7–4.0)
MCHC: 33.1 g/dL (ref 30.0–36.0)
Monocytes Absolute: 0.7 10*3/uL (ref 0.1–1.0)
Monocytes Relative: 12 % (ref 3–12)
Neutro Abs: 2.2 10*3/uL (ref 1.7–7.7)
Neutrophils Relative %: 39 % — ABNORMAL LOW (ref 43–77)
Platelets: 375 10*3/uL (ref 150–400)
RDW: 13.6 % (ref 11.5–15.5)
WBC: 5.8 10*3/uL (ref 4.0–10.5)

## 2012-10-18 LAB — COMPREHENSIVE METABOLIC PANEL
ALT: 32 U/L (ref 0–53)
AST: 23 U/L (ref 0–37)
Albumin: 4.4 g/dL (ref 3.5–5.2)
Alkaline Phosphatase: 72 U/L (ref 39–117)
BUN: 13 mg/dL (ref 6–23)
Creat: 0.96 mg/dL (ref 0.50–1.35)
Potassium: 4 mEq/L (ref 3.5–5.3)
Sodium: 135 mEq/L (ref 135–145)

## 2012-10-18 LAB — LIPID PANEL
Cholesterol: 284 mg/dL — ABNORMAL HIGH (ref 0–200)
HDL: 40 mg/dL (ref >39)
Total CHOL/HDL Ratio: 7.1 Ratio
Triglycerides: 477 mg/dL — ABNORMAL HIGH (ref <150)

## 2012-10-18 NOTE — Progress Notes (Signed)
  Subjective:    Patient ID: Jesse Duncan, male    DOB: 01-17-1985, 27 y.o.   MRN: [id number]  HPI He is here for a followup. He continues on medications listed in the chart. I reviewed them with him and he does state that he is taking them. He states that he has not noted any improvement in his energy, stamina or libido.   Review of Systems     Objective:   Physical Exam Alert and in no distress otherwise not examined       Assessment & Plan:  Need for prophylactic vaccination and inoculation against influenza - Plan: Flu Vaccine QUAD 36+ mos PF IM (Fluarix)  Hypertension - Plan: CBC with Differential, Comprehensive metabolic panel  Hypogonadism male - Plan: CBC with Differential, Comprehensive metabolic panel, Testosterone  Exogenous obesity - Plan: CBC with Differential, Comprehensive metabolic panel, Lipid panel  Special screening for malignant neoplasm of prostate - Plan: PSA  discussed the relationship between low testosterone and his weight and again encouraged him to make lifestyle changes.

## 2012-10-19 LAB — PSA: PSA: 1.57 ng/mL (ref <=4.00)

## 2012-10-19 LAB — TESTOSTERONE: Testosterone: 278 ng/dL — ABNORMAL LOW (ref 300–890)

## 2012-10-19 NOTE — Progress Notes (Signed)
Quick Note:  CALLED PT HOME / CELL # PT HAS APPOINTMENT AT 10/28 AT 8:15 ______

## 2012-10-31 ENCOUNTER — Ambulatory Visit (INDEPENDENT_AMBULATORY_CARE_PROVIDER_SITE_OTHER): Payer: 59 | Admitting: Family Medicine

## 2012-10-31 DIAGNOSIS — E6609 Other obesity due to excess calories: Secondary | ICD-10-CM

## 2012-10-31 DIAGNOSIS — E785 Hyperlipidemia, unspecified: Secondary | ICD-10-CM

## 2012-10-31 DIAGNOSIS — E669 Obesity, unspecified: Secondary | ICD-10-CM

## 2012-10-31 DIAGNOSIS — I10 Essential (primary) hypertension: Secondary | ICD-10-CM

## 2012-10-31 MED ORDER — ROSUVASTATIN CALCIUM 20 MG PO TABS: 20.0000 mg | ORAL_TABLET | Freq: Every day | ORAL | Status: DC

## 2012-10-31 NOTE — Progress Notes (Signed)
  Subjective:    Patient ID: Jesse Duncan, male    DOB: [birth year], 27 y.o.   MRN: [id number]  HPI He is here for consult concerning recent blood work which did show worsening of his lipid panel. He states that he has been taking Pravachol regularly.   Review of Systems     Objective:   Physical Exam Joneen Boers and in no distress otherwise not examined      Assessment & Plan:  Hypertension  Hyperlipidemia - Plan: rosuvastatin (CRESTOR) 20 MG tablet, Amb ref to Medical Nutrition Therapy-MNT  Exogenous obesity  I discussed his elevated lipid panel in regard to dietary modification and switching to a new medication. Strongly encouraged him to do both. Also discussed the risk of elevated triglycerides as well as elevated cholesterol regard to pancreatitis, cardiovascular disease, stroke etc. area and I will send him to nutritionist. I will give him samples of Crestor and discussed possible side effects. Return here in 2 months.

## 2012-11-08 ENCOUNTER — Other Ambulatory Visit: Payer: Self-pay

## 2012-11-08 ENCOUNTER — Telehealth: Payer: Self-pay | Admitting: Family Medicine

## 2012-11-08 DIAGNOSIS — J309 Allergic rhinitis, unspecified: Secondary | ICD-10-CM

## 2012-11-08 DIAGNOSIS — E785 Hyperlipidemia, unspecified: Secondary | ICD-10-CM

## 2012-11-08 DIAGNOSIS — I10 Essential (primary) hypertension: Secondary | ICD-10-CM

## 2012-11-08 MED ORDER — FLUTICASONE PROPIONATE 50 MCG/ACT NA SUSP: 2.0000 | Freq: Every day | NASAL | Status: DC

## 2012-11-08 MED ORDER — AMLODIPINE BESYLATE 10 MG PO TABS: 10.0000 mg | ORAL_TABLET | Freq: Every day | ORAL | Status: DC

## 2012-11-08 MED ORDER — LISINOPRIL-HYDROCHLOROTHIAZIDE 20-12.5 MG PO TABS: 1.0000 | ORAL_TABLET | Freq: Every day | ORAL | Status: DC

## 2012-11-08 MED ORDER — PRAVASTATIN SODIUM 40 MG PO TABS: 40.0000 mg | ORAL_TABLET | Freq: Every day | ORAL | Status: DC

## 2012-11-08 MED ORDER — NEBIVOLOL HCL 20 MG PO TABS: 2.0000 | ORAL_TABLET | ORAL | Status: DC

## 2012-11-08 NOTE — Telephone Encounter (Signed)
done

## 2012-11-08 NOTE — Telephone Encounter (Signed)
Pt states he needs all medications filled. He was seen on 10/28 and stated all meds where to be refilled but the only one that was is crestor. Please refill pt medications to pharmacy.

## 2012-11-08 NOTE — Telephone Encounter (Signed)
Sent all meds in.

## 2013-01-02 ENCOUNTER — Ambulatory Visit: Payer: 59 | Admitting: Family Medicine

## 2013-01-11 ENCOUNTER — Ambulatory Visit (INDEPENDENT_AMBULATORY_CARE_PROVIDER_SITE_OTHER): Payer: 59 | Admitting: Family Medicine

## 2013-01-11 VITALS — BP 118/84 | HR 107 | Wt 317.0 lb

## 2013-01-11 DIAGNOSIS — E785 Hyperlipidemia, unspecified: Secondary | ICD-10-CM

## 2013-01-11 DIAGNOSIS — E669 Obesity, unspecified: Secondary | ICD-10-CM

## 2013-01-11 DIAGNOSIS — Z79899 Other long term (current) drug therapy: Secondary | ICD-10-CM

## 2013-01-11 DIAGNOSIS — I10 Essential (primary) hypertension: Secondary | ICD-10-CM

## 2013-01-11 DIAGNOSIS — E6609 Other obesity due to excess calories: Secondary | ICD-10-CM

## 2013-01-11 LAB — COMPREHENSIVE METABOLIC PANEL
ALBUMIN: 4.6 g/dL (ref 3.5–5.2)
ALT: 40 U/L (ref 0–53)
AST: 36 U/L (ref 0–37)
Alkaline Phosphatase: 52 U/L (ref 39–117)
BUN: 16 mg/dL (ref 6–23)
CALCIUM: 10.1 mg/dL (ref 8.4–10.5)
CO2: 28 mEq/L (ref 19–32)
Chloride: 99 mEq/L (ref 96–112)
Creat: 1.05 mg/dL (ref 0.50–1.35)
GLUCOSE: 103 mg/dL — AB (ref 70–99)
Potassium: 4.3 mEq/L (ref 3.5–5.3)
Sodium: 135 mEq/L (ref 135–145)
Total Bilirubin: 0.7 mg/dL (ref 0.3–1.2)
Total Protein: 7.9 g/dL (ref 6.0–8.3)

## 2013-01-11 LAB — LIPID PANEL
CHOL/HDL RATIO: 6.7 ratio
CHOLESTEROL: 273 mg/dL — AB (ref 0–200)
HDL: 41 mg/dL (ref >39)
LDL Cholesterol: 184 mg/dL — ABNORMAL HIGH (ref 0–99)
Triglycerides: 241 mg/dL — ABNORMAL HIGH (ref <150)
VLDL: 48 mg/dL — ABNORMAL HIGH (ref 0–40)

## 2013-01-11 NOTE — Progress Notes (Signed)
   Subjective:    Patient ID: Byrd HesselbachChristopher Man, male    DOB: 06/29/1985, 28 y.o.   MRN: [id number]  HPI Is here for recheck. He continues on medications listed in the chart. He has made some dietary changes and is now getting more rest.   Review of Systems     Objective:   Physical Exam Alert and in no distress. Blood pressure and weight are recorded.       Assessment & Plan:  Hyperlipidemia - Plan: Lipid panel  Hypertension - Plan: Comprehensive metabolic panel  Encounter for long-term (current) use of other medications - Plan: Lipid panel, Comprehensive metabolic panel  Exogenous obesity  encouraged him to continue with making dietary changes. Continue present medications.

## 2013-01-12 NOTE — Progress Notes (Signed)
Pt number disconnected

## 2013-01-12 NOTE — Progress Notes (Signed)
Number disconnected.

## 2013-04-15 ENCOUNTER — Encounter (HOSPITAL_COMMUNITY): Payer: Self-pay

## 2013-04-16 ENCOUNTER — Emergency Department
Admission: EM | Admit: 2013-04-16 | Discharge: 2013-04-16 | Discharge status: Against Medical Advice | Payer: Medicaid Other | Attending: Emergency Medicine | Admitting: Emergency Medicine

## 2013-04-16 DIAGNOSIS — M79609 Pain in unspecified limb: Secondary | ICD-10-CM | POA: Insufficient documentation

## 2013-04-16 DIAGNOSIS — Z5320 Procedure and treatment not carried out because of patient's decision for unspecified reasons: Secondary | ICD-10-CM | POA: Insufficient documentation

## 2013-04-16 DIAGNOSIS — Z5321 Procedure and treatment not carried out due to patient leaving prior to being seen by health care provider: Secondary | ICD-10-CM

## 2013-07-12 ENCOUNTER — Encounter: Payer: Self-pay | Admitting: Family Medicine

## 2013-07-12 ENCOUNTER — Ambulatory Visit (INDEPENDENT_AMBULATORY_CARE_PROVIDER_SITE_OTHER): Payer: 59 | Admitting: Family Medicine

## 2013-07-12 VITALS — BP 130/80 | HR 77 | Wt 328.0 lb

## 2013-07-12 DIAGNOSIS — E6609 Other obesity due to excess calories: Secondary | ICD-10-CM

## 2013-07-12 DIAGNOSIS — E785 Hyperlipidemia, unspecified: Secondary | ICD-10-CM

## 2013-07-12 DIAGNOSIS — E291 Testicular hypofunction: Secondary | ICD-10-CM

## 2013-07-12 DIAGNOSIS — I10 Essential (primary) hypertension: Secondary | ICD-10-CM

## 2013-07-12 DIAGNOSIS — E669 Obesity, unspecified: Secondary | ICD-10-CM

## 2013-07-12 DIAGNOSIS — Z91199 Patient's noncompliance with other medical treatment and regimen due to unspecified reason: Secondary | ICD-10-CM

## 2013-07-12 DIAGNOSIS — Z9119 Patient's noncompliance with other medical treatment and regimen: Secondary | ICD-10-CM

## 2013-07-12 MED ORDER — LISINOPRIL-HYDROCHLOROTHIAZIDE 20-12.5 MG PO TABS: 1.0000 | ORAL_TABLET | Freq: Every day | ORAL | Status: DC

## 2013-07-12 MED ORDER — ROSUVASTATIN CALCIUM 20 MG PO TABS: 20.0000 mg | ORAL_TABLET | Freq: Every day | ORAL | Status: DC

## 2013-07-12 MED ORDER — TESTOSTERONE 50 MG/5GM (1%) TD GEL: TRANSDERMAL | Status: DC

## 2013-07-12 MED ORDER — NEBIVOLOL HCL 20 MG PO TABS: 2.0000 | ORAL_TABLET | ORAL | Status: DC

## 2013-07-12 MED ORDER — AMLODIPINE BESYLATE 10 MG PO TABS: 10.0000 mg | ORAL_TABLET | Freq: Every day | ORAL | Status: DC

## 2013-07-12 NOTE — Progress Notes (Signed)
   Subjective:    Patient ID: Jesse Duncan, male    DOB: May 08, 1985, 28 y.o.   MRN: [id number]  HPI He is here for recheck. He admits to missing doses of both his blood pressure and his cholesterol medication. He states he has not taken his cholesterol meds in over 2 weeks. I did not discuss testosterone therapy with him and at this time. He states his eating habits are such that he has only one meal per day.   Review of Systems     Objective:   Physical Exam Alert and in no distress. Blood pressure and weight are recorded.       Assessment & Plan:  Essential hypertension  Hyperlipidemia  Exogenous obesity  Personal history of noncompliance with medical treatment, presenting hazards to health  Since he is not taking his statin regularly have recommended that he take it regularly and return here in 2 months for a recheck. I will also discuss testosterone at that time. I discussed his weight with him in regard to lifestyle. Discussed the need for him to make eating habit changes as he states he is fairly physically active. I will refer him to a nutritionist. I did inform him that if he does not use this information there is no need to send him there. Again stressed the need to do this in regard to his overall health and well-being. I tried to get him to recognize that his health needs to be at least on the same level is the things that interfering with him taking his medications regularly.

## 2013-09-09 ENCOUNTER — Emergency Department (EMERGENCY_DEPARTMENT_HOSPITAL): Payer: Self-pay

## 2013-09-09 ENCOUNTER — Encounter (HOSPITAL_COMMUNITY): Payer: Self-pay | Admitting: Student in an Organized Health Care Education/Training Program

## 2013-09-09 ENCOUNTER — Other Ambulatory Visit (HOSPITAL_COMMUNITY): Payer: Self-pay

## 2013-09-09 ENCOUNTER — Emergency Department
Admission: EM | Admit: 2013-09-09 | Discharge: 2013-09-10 | Disposition: A | Discharge status: Home / Self Care | Payer: Self-pay | Attending: Emergency Medicine | Admitting: Emergency Medicine

## 2013-09-09 DIAGNOSIS — S298XXA Other specified injuries of thorax, initial encounter: Secondary | ICD-10-CM

## 2013-09-09 DIAGNOSIS — S199XXA Unspecified injury of neck, initial encounter: Secondary | ICD-10-CM

## 2013-09-09 DIAGNOSIS — M79609 Pain in unspecified limb: Secondary | ICD-10-CM | POA: Insufficient documentation

## 2013-09-09 DIAGNOSIS — IMO0002 Reserved for concepts with insufficient information to code with codable children: Secondary | ICD-10-CM

## 2013-09-09 DIAGNOSIS — S3981XA Other specified injuries of abdomen, initial encounter: Secondary | ICD-10-CM

## 2013-09-09 DIAGNOSIS — S79929A Unspecified injury of unspecified thigh, initial encounter: Secondary | ICD-10-CM

## 2013-09-09 DIAGNOSIS — R404 Transient alteration of awareness: Secondary | ICD-10-CM | POA: Insufficient documentation

## 2013-09-09 DIAGNOSIS — S59919A Unspecified injury of unspecified forearm, initial encounter: Secondary | ICD-10-CM

## 2013-09-09 DIAGNOSIS — M542 Cervicalgia: Secondary | ICD-10-CM | POA: Insufficient documentation

## 2013-09-09 DIAGNOSIS — S79919A Unspecified injury of unspecified hip, initial encounter: Secondary | ICD-10-CM

## 2013-09-09 DIAGNOSIS — S46909A Unspecified injury of unspecified muscle, fascia and tendon at shoulder and upper arm level, unspecified arm, initial encounter: Secondary | ICD-10-CM

## 2013-09-09 DIAGNOSIS — S0993XA Unspecified injury of face, initial encounter: Secondary | ICD-10-CM

## 2013-09-09 DIAGNOSIS — M25511 Pain in right shoulder: Secondary | ICD-10-CM

## 2013-09-09 DIAGNOSIS — M25519 Pain in unspecified shoulder: Secondary | ICD-10-CM | POA: Insufficient documentation

## 2013-09-09 DIAGNOSIS — S99929A Unspecified injury of unspecified foot, initial encounter: Secondary | ICD-10-CM

## 2013-09-09 DIAGNOSIS — F17200 Nicotine dependence, unspecified, uncomplicated: Secondary | ICD-10-CM | POA: Insufficient documentation

## 2013-09-09 DIAGNOSIS — S6990XA Unspecified injury of unspecified wrist, hand and finger(s), initial encounter: Secondary | ICD-10-CM

## 2013-09-09 DIAGNOSIS — S0990XA Unspecified injury of head, initial encounter: Secondary | ICD-10-CM

## 2013-09-09 DIAGNOSIS — S4980XA Other specified injuries of shoulder and upper arm, unspecified arm, initial encounter: Secondary | ICD-10-CM

## 2013-09-09 DIAGNOSIS — S8990XA Unspecified injury of unspecified lower leg, initial encounter: Secondary | ICD-10-CM

## 2013-09-09 DIAGNOSIS — S59909A Unspecified injury of unspecified elbow, initial encounter: Secondary | ICD-10-CM

## 2013-09-09 DIAGNOSIS — Z043 Encounter for examination and observation following other accident: Secondary | ICD-10-CM | POA: Insufficient documentation

## 2013-09-09 DIAGNOSIS — S99919A Unspecified injury of unspecified ankle, initial encounter: Secondary | ICD-10-CM

## 2013-09-09 LAB — CBC/DIFF
BASOPHILS: 1 %
BASOS ABS: 0.078 THOU/uL (ref 0.000–0.200)
EOS ABS: 0.142 THOU/uL (ref 0.000–0.500)
EOSINOPHIL: 1 %
HCT: 43 % (ref 36.7–47.0)
HGB: 14.8 g/dL (ref 12.5–16.3)
LYMPHOCYTES: 35 %
LYMPHS ABS: 4.043 THOU/uL (ref 1.000–4.800)
MCH: 29.3 pg (ref 27.4–33.0)
MCHC: 34.3 g/dL (ref 32.5–35.8)
MCV: 85.5 fL (ref 78–100)
MONOCYTES: 7 %
MONOS ABS: 0.791 10*3/uL (ref 0.300–1.000)
MPV: 8.1 fL (ref 7.5–11.5)
PLATELET COUNT: 202 THOU/uL (ref 140–450)
PMN ABS: 6.383 THOU/uL (ref 1.500–7.700)
PMN'S: 56 %
PMN'S: 56 %
RBC: 5.04 MIL/uL (ref 4.06–5.63)
RDW: 12.8 % (ref 12.0–15.0)
WBC: 11.4 10*3/uL — ABNORMAL HIGH (ref 3.5–11.0)

## 2013-09-09 LAB — VENOUS BLOOD GAS/LACTATE/CO-OX/LYTES (NA/K/CA/CL/GLUC)
%FIO2: 21 %
BICARBONATE: 25.4 mmol/L (ref 22.0–26.0)
HEMOGLOBIN: 15 g/dL (ref 13.5–18.0)
O2HB: 84.2 % (ref 40.0–70.0)
PCO2: 31 mm Hg — ABNORMAL LOW (ref 41.0–51.0)
SODIUM: 142 mmol/L (ref 136–145)
WHOLE BLOOD K+: 3.6 mmol/L (ref 3.0–5.0)

## 2013-09-09 LAB — PT/INR
INR: 0.96 (ref 0.80–1.20)
PROTHROMBIN TIME: 10.4 s (ref 8.7–13.2)

## 2013-09-09 LAB — PTT (PARTIAL THROMBOPLASTIN TIME): APTT: 28.4 s (ref 25.1–36.5)

## 2013-09-09 LAB — TYPE AND SCREEN
ABO/RH(D): O POS
ANTIBODY SCREEN: NEGATIVE

## 2013-09-09 LAB — VENOUS BLOOD GAS/LACTATE/CO-OX/LYTES (NA/K/CA/CL/GLUC) - ORS ONLY
BASE EXCESS: 1.1 mmol/L (ref 0.0–2.0)
CARBOHYHEMOGLOBIN: 7.6 % (ref 0.0–1.5)
CHLORIDE: 109 mmol/L (ref 96–111)
GLUCOSE: 82 mg/dL (ref 70–105)
IONIZED CALCIUM: 1.08 mmol/L — ABNORMAL LOW (ref 1.30–1.46)
LACTATE: 1.9 mmol/L — ABNORMAL HIGH (ref 0.9–1.7)
MET-HEMOGLOBIN: 1.6 % (ref 0.0–3.0)
O2CT: 17.7 — ABNORMAL HIGH (ref 7.5–17.5)
PH: 7.49 — ABNORMAL HIGH (ref 7.310–7.410)
PO2: 56 mmHg — ABNORMAL HIGH (ref 35–50)

## 2013-09-09 LAB — BUN
BUN/CREAT RATIO: 14 (ref 6–22)
BUN: 13 mg/dL (ref 8–25)

## 2013-09-09 LAB — ETHANOL, SERUM/PLASMA: ETHANOL, SERUM: 174 mg/dL — ABNORMAL HIGH (ref <10)

## 2013-09-09 LAB — CREATININE WITH EGFR: ESTIMATED GLOMERULAR FILTRATION RATE: >59 ml/min/1.73m2 (ref >59)

## 2013-09-09 LAB — CREATININE
CREATININE: 0.92 mg/dL (ref 0.62–1.27)
ESTIMATED GLOMERULAR FILTRATION RATE: >59 mL/min/{1.73_m2} (ref >59)

## 2013-09-09 MED ORDER — MAGNESIUM HYDROXIDE 400 MG/5 ML ORAL SUSPENSION
15.0000 mL | ORAL | Status: DC
Start: 2013-09-10 — End: 2013-09-10

## 2013-09-09 MED ORDER — FENTANYL (PF) 50 MCG/ML INJECTION SOLUTION
INTRAMUSCULAR | Status: DC
Start: 2013-09-09 — End: 2013-09-10
  Filled 2013-09-09: qty 2

## 2013-09-09 MED ORDER — FENTANYL (PF) 50 MCG/ML INJECTION SOLUTION
INTRAMUSCULAR | Status: DC
Start: 2013-09-09 — End: 2013-09-10
  Filled 2013-09-09: qty 4

## 2013-09-09 MED ORDER — FAMOTIDINE 20 MG TABLET
20.0000 mg | ORAL_TABLET | Freq: Two times a day (BID) | ORAL | Status: DC
Start: 2013-09-10 — End: 2013-09-10

## 2013-09-09 MED ORDER — DOCUSATE SODIUM 100 MG CAPSULE
100.0000 mg | ORAL_CAPSULE | Freq: Two times a day (BID) | ORAL | Status: DC
Start: 2013-09-10 — End: 2013-09-10

## 2013-09-09 MED ORDER — SODIUM CHLORIDE 0.9 % INTRAVENOUS SOLUTION
INTRAVENOUS | Status: DC
Start: 2013-09-10 — End: 2013-09-10

## 2013-09-09 MED ORDER — SENNOSIDES 8.6 MG-DOCUSATE SODIUM 50 MG TABLET
1.0000 | ORAL_TABLET | Freq: Two times a day (BID) | ORAL | Status: DC
Start: 2013-09-10 — End: 2013-09-10

## 2013-09-09 MED ORDER — IOPAMIDOL 300 MG IODINE/ML (61 %) INTRAVENOUS SOLUTION
100.00 mL | INTRAVENOUS | Status: AC
Start: 2013-09-09 — End: 2013-09-09
  Administered 2013-09-09: 100 mL via INTRAVENOUS

## 2013-09-09 MED ORDER — LACTATED RINGERS INTRAVENOUS SOLUTION
INTRAVENOUS | Status: DC
Start: 2013-09-10 — End: 2013-09-10

## 2013-09-09 MED ADMIN — herbal drugs lotion: INTRAVENOUS | @ 23:00:00
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
## 2013-09-10 ENCOUNTER — Encounter (HOSPITAL_COMMUNITY): Payer: Self-pay

## 2013-09-10 MED ORDER — HYDROMORPHONE 1 MG/ML INJECTION WRAPPER
0.20 mg | INJECTION | INTRAMUSCULAR | Status: AC
Start: 2013-09-10 — End: 2013-09-10
  Administered 2013-09-10: 0.2 mg via INTRAVENOUS
  Filled 2013-09-10: qty 1

## 2013-09-10 MED ORDER — CYCLOBENZAPRINE 10 MG TABLET
10.00 mg | ORAL_TABLET | Freq: Three times a day (TID) | ORAL | Status: DC
Start: 2013-09-10 — End: 2014-07-29

## 2013-09-10 MED ORDER — NAPROXEN 500 MG TABLET
500.00 mg | ORAL_TABLET | Freq: Two times a day (BID) | ORAL | Status: DC
Start: 2013-09-10 — End: 2014-07-29

## 2013-09-10 MED ADMIN — sodium chloride 0.9 % (flush) injection syringe: @ 01:00:00

## 2013-09-18 ENCOUNTER — Encounter (INDEPENDENT_AMBULATORY_CARE_PROVIDER_SITE_OTHER): Payer: Self-pay

## 2013-09-25 MED FILL — fentaNYL (PF) 50 mcg/mL injection solution: INTRAMUSCULAR | Qty: 2 | Status: AC

## 2013-10-01 ENCOUNTER — Ambulatory Visit: Payer: 59 | Admitting: Family Medicine

## 2013-10-24 ENCOUNTER — Emergency Department
Admission: EM | Admit: 2013-10-24 | Discharge: 2013-10-24 | Discharge status: Against Medical Advice | Payer: Self-pay | Attending: Emergency Medicine | Admitting: Emergency Medicine

## 2013-10-24 DIAGNOSIS — K088 Other specified disorders of teeth and supporting structures: Secondary | ICD-10-CM | POA: Insufficient documentation

## 2013-10-24 DIAGNOSIS — Z5329 Procedure and treatment not carried out because of patient's decision for other reasons: Secondary | ICD-10-CM | POA: Insufficient documentation

## 2013-11-13 ENCOUNTER — Encounter: Payer: Self-pay | Admitting: Medical

## 2013-11-13 ENCOUNTER — Ambulatory Visit (INDEPENDENT_AMBULATORY_CARE_PROVIDER_SITE_OTHER): Payer: Self-pay | Admitting: Medical

## 2013-11-13 VITALS — BP 160/100 | HR 90 | Temp 97.6°F | Resp 16 | Ht 72.0 in | Wt 337.0 lb

## 2013-11-13 DIAGNOSIS — Z008 Encounter for other general examination: Secondary | ICD-10-CM

## 2013-11-13 DIAGNOSIS — E669 Obesity, unspecified: Secondary | ICD-10-CM

## 2013-11-13 DIAGNOSIS — F17200 Nicotine dependence, unspecified, uncomplicated: Secondary | ICD-10-CM

## 2013-11-13 DIAGNOSIS — I10 Essential (primary) hypertension: Secondary | ICD-10-CM

## 2013-11-13 DIAGNOSIS — Z0289 Encounter for other administrative examinations: Secondary | ICD-10-CM

## 2013-11-13 DIAGNOSIS — Z720 Tobacco use: Secondary | ICD-10-CM

## 2013-11-13 DIAGNOSIS — E785 Hyperlipidemia, unspecified: Secondary | ICD-10-CM

## 2013-11-13 NOTE — Progress Notes (Signed)
Commercial Driver Medical Examination   Jesse HesselbachChristopher Gruenberg is a 28 y.o. 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  Medical care team includes:  Dr. Susann GivensLalonde here - primary care  The patient reports no problems.  Review of Systems A comprehensive review of systems was reviewed and noted as below:  Eye: - corrective lenses, -lasik surgery or other eye surgery, -glaucoma, -cataracts, -macular degeneration, -monocular vision, -medication for eye condition, -blurred vision,   Ears: -hearing problems, - hearing aids, -ear pain, -ear drainage, -ear fullness, -tinnitus, -recurrent ear infection, -previous ear surgery, - vertigo, -meniere's disease  Endocrine: -polydipsia, -polyuria, -weight loss, -fainting, -dizziness, - altered or loss of consciousness, -hypoglycemia  Cardiovascular: -heart disease, -CHF, -heart attack, -cardiac stents, -bypass surgery, -other heart surgery, +hypertension, -blood clots, -pacemaker, -medications for heart condition, -chest pain, -SOB, -palpitations, -fainting, -dizziness, -dyspnea  Respiratory: -asthma, -COPD, other lung disease, -smoker, -chest tightness, - wheezing, +snoring, -daytime sleepiness, -sleep apnea or uses CPAP, -narcolepsy, -sleeping disorder  Allergy: -uncontrollable sneezing or allergy symptoms  Musculoskeletal: -missing body parts, -muscle disease, -bone disease, -spine injury, -low back pain, -medication for joints, bones, muscles or pain, -physical limitations, -joint pain, -neck pain, -limitations of neck ROM, -back surgery, orthopedic surgery, -rheumatologic condition, -gout  Neurologic: -neurologic disease, -dementia, -seizures, -parkinsons, -tremor, -memory problems, -weakness, -numbness, -tingling, -medication for neurologic condition, -medications for sleep condition  Gastric: -abdominal pain, -chronic diarrhea or IBS, -uncontrollable  nausea  Kidney/Renal: -hematuria, -dialysis, kidney disease, polycystic kidney disease  Psychiatric: -homicidal thoughts, -suicidal thoughts, -prior suicide attempts, -get into fights/hurting others, -memory or concentration problems, -delusions, -hallucinations, -hospitalization for mental health problem, -depression, -anxiety, -bipolar  Drug use: - none  Past Medical History  Diagnosis Date  . Hypertension   . Obesity   . Dyslipidemia   . Smoker     History reviewed. No pertinent past surgical history.  History   Social History  . Marital Status: Single    Spouse Name: N/A    Number of Children: N/A  . Years of Education: N/A   Occupational History  . Not on file.   Social History Main Topics  . Smoking status: Current Some Day Smoker -- 6 years    Types: Cigars    Last Attempt to Quit: 01/04/2010  . Smokeless tobacco: Never Used  . Alcohol Use: No  . Drug Use: No  . Sexual Activity: Not Currently   Other Topics Concern  . 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       Family History  Problem Relation Age of Onset  . Hypertension Mother   . Stroke Maternal Grandmother   . Cancer Maternal Grandfather   . Heart disease Neg Hx     Current outpatient prescriptions: amLODipine (NORVASC) 10 MG tablet, Take 1 tablet (10 mg total) by mouth daily., Disp: 30 tablet, Rfl: 3;  lisinopril-hydrochlorothiazide (PRINZIDE,ZESTORETIC) 20-12.5 MG per tablet, Take 1 tablet by mouth daily., Disp: 30 tablet, Rfl: 3;  Nebivolol HCl (BYSTOLIC) 20 MG TABS, Take 2 tablets (40 mg total) by mouth 1 day or 1 dose., Disp: 60 tablet, Rfl: 11 Nebivolol HCl (BYSTOLIC) 20 MG TABS, Take 1 tablet (20 mg total) by mouth daily., Disp: 30 tablet, Rfl: 3;  rosuvastatin (CRESTOR) 20 MG tablet, Take 1 tablet (20 mg total) by mouth daily., Disp: 30 tablet, Rfl: 3;  testosterone (TESTIM) 50 MG/5GM (  1%) GEL, PLACE DAILY ON SKIN, Disp: 150 g, Rfl:  5  No Known Allergies   Reviewed their medical, surgical, family, social, medication, and allergy history and updated chart as appropriate.      Objective:   Physical Exam  BP 160/100 mmHg  Pulse 90  Temp(Src) 97.6 F (36.4 C) (Oral)  Resp 16  Ht 6' (1.829 m)  Wt 337 lb (152.862 kg)  BMI 45.70 kg/m2   General appearance: alert, no distress, WD/WN,obese AA male Skin: no worrisome lesions HEENT: normocephalic, conjunctiva/corneas normal, sclerae anicteric, PERRLA, EOMi, nares patent, no discharge or erythema, pharynx normal Oral cavity: MMM, tongue normal, teeth normal Neck: supple, no lymphadenopathy, no thyromegaly, no masses, normal ROM, no bruits Chest: non tender, normal shape and expansion Heart: RRR, normal S1, S2, no murmurs Lungs: CTA bilaterally, no wheezes, rhonchi, or rales Abdomen: +bs, soft, non tender, non distended, no masses, no hepatomegaly, no splenomegaly, no bruits Back: non tender, normal ROM, no scoliosis Musculoskeletal: upper extremities non tender, no obvious deformity, normal ROM throughout, lower extremities non tender, no obvious deformity, normal ROM throughout Extremities: no edema, no cyanosis, no clubbing Pulses: 2+ symmetric, upper and lower extremities, normal cap refill Neurological: alert, oriented x 3, CN2-12 intact, strength normal upper extremities and lower extremities, sensation normal throughout, DTRs 2+ throughout, no cerebellar signs, gait normal Psychiatric: normal affect, behavior normal, pleasant  GU: normal male external genitalia, circumcised, nontender, no masses, no hernia, no lymphadenopathy Rectal: deferred  Vision:  Uncorrected Corrected Horizontal Field of Vision  Right Eye 20/25 n/a 90 degrees  Left Eye  20/25 n/a 90 degrees  Both Eyes  20/25 n/a    Applicant can recognize and distinguish among traffic control signals and devices showing standard red, green, and amber colors.  Monocular Vision?:  No   Hearing: Passed forced whisper test at 10 feet   Assessment:   Encounter Diagnoses  Name Primary?  . Health examination of defined subpopulation Yes  . Essential hypertension   . Obesity   . Hyperlipidemia   . Tobacco use disorder      Plan:   Discussed purpose of DOT examination, identified concerns.    He is givne a conditional 3 month certificate.   He was advise to make significant changes with diet, exercise, weight loss efforts, better compliance at medication, and advised to stop tobacco .  Advised if BP not at goal in February on recheck , will be unable to issue certificate.  If BP at goal at that time, will qualify for 1 year certificate.   Advised to f/u soon with Dr[facility] labs, EKG, and compliance with mediations.  Medical examiners certificate completed and printed. Need follow-up in 3 months for recheck of hypertension.

## 2013-11-14 ENCOUNTER — Encounter: Payer: Self-pay | Admitting: Medical

## 2013-11-14 DIAGNOSIS — Z789 Other specified health status: Secondary | ICD-10-CM | POA: Insufficient documentation

## 2013-11-14 DIAGNOSIS — F17200 Nicotine dependence, unspecified, uncomplicated: Secondary | ICD-10-CM

## 2013-11-14 DIAGNOSIS — I10 Essential (primary) hypertension: Secondary | ICD-10-CM | POA: Insufficient documentation

## 2013-11-14 HISTORY — DX: Nicotine dependence, unspecified, uncomplicated: F17.200

## 2013-11-14 MED ORDER — ROSUVASTATIN CALCIUM 20 MG PO TABS: 20.0000 mg | ORAL_TABLET | Freq: Every day | ORAL | Status: DC

## 2013-11-14 MED ORDER — NEBIVOLOL HCL 20 MG PO TABS: 1.0000 | ORAL_TABLET | Freq: Every day | ORAL | Status: DC

## 2013-11-14 MED ORDER — AMLODIPINE BESYLATE 10 MG PO TABS: 10.0000 mg | ORAL_TABLET | Freq: Every day | ORAL | Status: DC

## 2013-11-14 MED ORDER — LISINOPRIL-HYDROCHLOROTHIAZIDE 20-12.5 MG PO TABS: 1.0000 | ORAL_TABLET | Freq: Every day | ORAL | Status: DC

## 2014-01-28 ENCOUNTER — Encounter: Payer: Self-pay | Admitting: Family Medicine

## 2014-01-28 ENCOUNTER — Ambulatory Visit (INDEPENDENT_AMBULATORY_CARE_PROVIDER_SITE_OTHER): Payer: 59 | Admitting: Family Medicine

## 2014-01-28 VITALS — BP 140/90 | HR 80 | Wt 343.0 lb

## 2014-01-28 DIAGNOSIS — I10 Essential (primary) hypertension: Secondary | ICD-10-CM

## 2014-01-28 DIAGNOSIS — G479 Sleep disorder, unspecified: Secondary | ICD-10-CM

## 2014-01-28 DIAGNOSIS — E291 Testicular hypofunction: Secondary | ICD-10-CM

## 2014-01-28 LAB — TESTOSTERONE: Testosterone: 211 ng/dL — ABNORMAL LOW (ref 300–890)

## 2014-01-28 NOTE — Progress Notes (Signed)
   Subjective:    Patient ID: Jesse Duncan, male    DOB: Mar 12, 1985, 29 y.o.   MRN: [id number]  HPI He is here for a blood pressure recheck. He continues on his blood pressure medications. He has not been on his Testim stating he did not like the smell. He has not let me know about this in the past. He did discuss the possibility of getting injections. He also states that he notes that he will occasionally wake up gasping for breath. He was told that he also has apneic episodes while he sleeps. He works 2 jobs and gets very erratic sleep because of this. He can fall asleep while in the car but does not have trouble in other situations. His neck size is 21   Review of Systems     Objective:   Physical Exam Alert and in no distress otherwise not examined       Assessment & Plan:  Sleep disturbance - Plan: Nocturnal polysomnography  Essential hypertension  Morbid obesity - Plan: Amb ref to Medical Nutrition Therapy-MNT  Hypogonadism male - Plan: Testosterone  I had a long discussion with him concerning his present lifestyle and weight continuing to his hypogonadism, possible sleep apnea, hypertension. Explained that all these issues can be positively affected with weight reduction. He states he is going to start into an exercise program. I will also refer him to nutritionist and set up for sleep study. Recheck testosterone and switch to a different topical.

## 2014-02-11 ENCOUNTER — Telehealth: Payer: Self-pay | Admitting: Family Medicine

## 2014-02-11 NOTE — Telephone Encounter (Signed)
Patient called and said that he had a DOT physical with you and at that time he only got a 3 month card and was suppose to follow up in 3 months. He followed up with Dr. Susann GivensLalonde here 01/28/14. Patient is calling to see about getting additional months on his DOT card. Please, advise what we need to do next.

## 2014-02-11 NOTE — Telephone Encounter (Signed)
We have a dilemma.  He was referred for sleep study and nutritionist for help with weight loss.  His BP was right at the cut off with his recent visit with Dr. Susann GivensLalonde.    If his sleep study shows sleep apnea, this will cause an automatic waiting period until he gets treated.   So we have 2 options, I can either wait until the sleep study results are in to make a determination, or I can give clearance now.  However, if I give clearance now and sleep study results show apnea, then he will have to start CPAP. 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   If he fails to cooperate with these recommendations, then he could lose his ability to maintain DOT in general.    That is our 2 choices.  See what he wants to do.

## 2014-02-12 NOTE — Telephone Encounter (Signed)
Ok, print the last DOT forms and get me new ones to amend

## 2014-02-12 NOTE — Telephone Encounter (Signed)
LMOM TO CB. CLS 

## 2014-02-12 NOTE — Telephone Encounter (Signed)
Patient called and went over [employer]inger PA-c message in detail. Patient states that he would like to get his DOT forms and certification now and he will get set up for his sleep study test because he states he doesn't think he has sleep apnea. If it does turn out that he hasn't then he will just have to take a month off but he feels like he doesn't. He needs his forms by Friday.

## 2014-02-13 NOTE — Telephone Encounter (Signed)
PATIENT IS AWARE OF Jesse HewsSHANE TYSINGER PA MESSAGE IN FULL DETAIL AND UNDERSTANDS. PATIENT WILL COME BY THE OFFICE TOMORROW TO FILL OUT HIS PART ON THE FORMS. WE WILL NEED TO COPY AFTERWARDS AND THEN GIVE HIM HIS COPY

## 2014-02-13 NOTE — Telephone Encounter (Signed)
Forms are on your desk.

## 2014-02-13 NOTE — Telephone Encounter (Signed)
This is to document DOT physical follow-up  Initial date of DOT physical was 11/13/13.   Follow-up was recommended due to hypertension  I reviewed the chart notes from recent visit with Dr. Susann GivensLalonde, where patient was seen in follow-up for hypertension.  I have completed my portions of the updated DOT certificate and forms.  Please have the patient return and complete their portions of the forms, make copies, make sure he gets the originals.  They will need to follow-up on 11/14/14 for next DOT physical.

## 2014-03-04 ENCOUNTER — Ambulatory Visit: Payer: Self-pay | Admitting: Skilled Nursing Facility1

## 2014-07-23 ENCOUNTER — Ambulatory Visit (INDEPENDENT_AMBULATORY_CARE_PROVIDER_SITE_OTHER): Payer: BC Managed Care – PPO | Admitting: Psychiatry

## 2014-07-23 ENCOUNTER — Encounter (FREE_STANDING_LABORATORY_FACILITY)
Admit: 2014-07-23 | Discharge: 2014-07-23 | Disposition: A | Discharge status: Home / Self Care | Payer: Self-pay | Attending: Psychiatry | Admitting: Psychiatry

## 2014-07-23 ENCOUNTER — Encounter (FREE_STANDING_LABORATORY_FACILITY): Payer: BC Managed Care – PPO | Attending: Psychiatry | Admitting: Psychiatry

## 2014-07-23 VITALS — BP 128/82 | HR 92 | Ht 68.43 in | Wt 175.5 lb

## 2014-07-23 DIAGNOSIS — F329 Major depressive disorder, single episode, unspecified: Secondary | ICD-10-CM

## 2014-07-23 DIAGNOSIS — Z5181 Encounter for therapeutic drug level monitoring: Secondary | ICD-10-CM

## 2014-07-23 DIAGNOSIS — F1221 Cannabis dependence, in remission: Secondary | ICD-10-CM

## 2014-07-23 DIAGNOSIS — F3181 Bipolar II disorder: Secondary | ICD-10-CM

## 2014-07-23 DIAGNOSIS — F17200 Nicotine dependence, unspecified, uncomplicated: Secondary | ICD-10-CM

## 2014-07-23 DIAGNOSIS — R454 Irritability and anger: Secondary | ICD-10-CM

## 2014-07-23 DIAGNOSIS — Z720 Tobacco use: Secondary | ICD-10-CM

## 2014-07-23 DIAGNOSIS — F32A Depression, unspecified: Secondary | ICD-10-CM | POA: Insufficient documentation

## 2014-07-23 LAB — CBC WITH DIFF
BASOPHIL #: 0.04 x10ˆ3/uL (ref 0.00–0.20)
BASOPHIL %: 1 %
EOSINOPHIL #: 0.09 x10ˆ3/uL (ref 0.00–0.50)
EOSINOPHIL %: 1 %
HCT: 48.4 % — ABNORMAL HIGH (ref 36.7–47.0)
HGB: 16 g/dL (ref 12.5–16.3)
LYMPHOCYTE #: 3.49 x10ˆ3/uL (ref 1.00–4.80)
LYMPHOCYTE %: 41 %
MCH: 29.2 pg (ref 27.4–33.0)
MCHC: 33.1 g/dL (ref 32.5–35.8)
MCV: 88 fL (ref 78.0–100.0)
MONOCYTE #: 0.8 x10ˆ3/uL (ref 0.30–1.00)
MONOCYTE %: 9 %
MPV: 9.1 fL (ref 7.5–11.5)
NEUTROPHIL #: 4.11 x10ˆ3/uL (ref 1.50–7.70)
NEUTROPHIL %: 48 %
PLATELETS: 217 x10ˆ3/uL (ref 140–450)
RBC: 5.5 x10?6/uL (ref 4.06–5.63)
RBC: 5.5 x10ˆ6/uL (ref 4.06–5.63)
RDW: 13.2 % (ref 12.0–15.0)
WBC: 8.5 10*3/uL (ref 3.5–11.0)
WBC: 8.5 x10ˆ3/uL (ref 3.5–11.0)

## 2014-07-23 LAB — CBC/DIFF - CLIENT CONSOLIDATED
BASOPHIL #: 0.04 x10ˆ3/uL (ref 0.00–0.20)
BASOPHIL %: 1 %
EOSINOPHIL #: 0.09 x10ˆ3/uL (ref 0.00–0.50)
EOSINOPHIL %: 1 %
HCT: 48.4 % — ABNORMAL HIGH (ref 36.7–47.0)
HGB: 16 g/dL (ref 12.5–16.3)
LYMPHOCYTE #: 3.49 10*3/uL (ref 1.00–4.80)
LYMPHOCYTE #: 3.49 x10ˆ3/uL (ref 1.00–4.80)
LYMPHOCYTE %: 41 %
MCH: 29.2 pg (ref 27.4–33.0)
MCHC: 33.1 g/dL (ref 32.5–35.8)
MCV: 88 fL (ref 78.0–100.0)
MONOCYTE #: 0.8 x10ˆ3/uL (ref 0.30–1.00)
MONOCYTE %: 9 %
MPV: 9.1 fL (ref 7.5–11.5)
NEUTROPHIL #: 4.11 x10ˆ3/uL (ref 1.50–7.70)
NEUTROPHIL %: 48 %
PLATELETS: 217 x10?3/uL (ref 140–450)
PLATELETS: 217 x10ˆ3/uL (ref 140–450)
RBC: 5.5 x10ˆ6/uL (ref 4.06–5.63)
RDW: 13.2 % (ref 12.0–15.0)
WBC: 8.5 x10ˆ3/uL
WBC: 8.5 x10ˆ3/uL (ref 3.5–11.0)

## 2014-07-23 LAB — HEPATIC FUNCTION PANEL
ALBUMIN: 5 g/dL (ref 3.5–5.0)
ALKALINE PHOSPHATASE: 87 U/L (ref <=150)
ALT (SGPT): 98 U/L — ABNORMAL HIGH (ref <=55)
AST (SGOT): 65 U/L — ABNORMAL HIGH (ref 8–48)
BILIRUBIN DIRECT: 0.3 mg/dL (ref <=0.3)
BILIRUBIN DIRECT: 0.3 mg/dL (ref <=0.3)
BILIRUBIN TOTAL: 0.7 mg/dL (ref 0.3–1.3)
PROTEIN TOTAL: 8.5 g/dL — ABNORMAL HIGH (ref 6.4–8.3)

## 2014-07-23 LAB — THYROID STIMULATING HORMONE (SENSITIVE TSH): TSH: 1.126 u[IU]/mL (ref 0.350–5.000)

## 2014-07-23 LAB — THYROXINE, FREE (FREE T4): THYROXINE (T4), FREE: 1.07 ng/dL (ref 0.70–1.25)

## 2014-07-23 MED ORDER — DIVALPROEX ER 500 MG TABLET,EXTENDED RELEASE 24 HR
500.0000 mg | ORAL_TABLET | Freq: Every day | ORAL | Status: DC
Start: 2014-07-23 — End: 2017-05-20

## 2014-07-23 NOTE — Progress Notes (Signed)
07/23/14 1400   Drug Screen Results   Amphetamine (AMP) Negative   Barbiturates (BAR) Negative   Bupenorphine (BUP) Negative   Benzodiazepine (BZO) Negative   Cocaine (COC) Negative   Methamphetamine (mAMP) Negative   Methadone (MTD) Negative   Opiates (OPI) Negative   Oxycodone (OXY) Negative   Marijuana (THC) Negative   Temperature within range? yes   Observed no   Tester sutton   Physician estaphan   Lot # [id number]   Expiration Date 05-17   Internal Control Valid yes   Initials cls
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
## 2014-07-24 ENCOUNTER — Telehealth (HOSPITAL_COMMUNITY): Payer: Self-pay | Admitting: Psychiatry

## 2014-07-24 NOTE — Telephone Encounter (Addendum)
-----   Message from Sol PasserAshley Jonas sent at 07/24/2014 12:06 PM EDT -----  >> ASHLEY JONAS 07/24/2014 12:06 PM  Hi Dr Allyn KennerEstaphan   Pt returned your missed call and asked if yo can give him a call back.  Thank You   Dava Najjarshley J     >> Alveta HeimlichVICKIE ELIZABETH STEWART 07/24/2014 11:00 AM  Hello,  Pt is calling you back.  Please Advise.  Thanks!  Vickie S        Called him and left a message    Ardeen JourdainNevine Assunta Pupo, MD  07/24/2014, 16:30

## 2014-07-24 NOTE — Telephone Encounter (Addendum)
-----   Message from [facility] Stewart sent at 07/24/2014 11:00 AM EDT -----  >> Alveta HeimlichVICKIE ELIZABETH STEWART 07/24/2014 11:00 AM  Hello,  Pt is calling you back.  Please Advise.  Thanks!  Vickie S      Called him back and left a message    Ardeen JourdainNevine Seara Hinesley, MD  07/24/2014, 11:07

## 2014-07-24 NOTE — Telephone Encounter (Signed)
Called him, line was disconnected . Tried to call back, and left a message with the phone number to call back  He was informed about his LFT, but the medication discussion was interrupted .    Shaun JourdainNevine Omaya Nieland, MD  07/24/2014, 10:20

## 2014-07-25 ENCOUNTER — Emergency Department (EMERGENCY_DEPARTMENT_HOSPITAL): Payer: BC Managed Care – PPO

## 2014-07-25 ENCOUNTER — Emergency Department
Admission: EM | Admit: 2014-07-25 | Discharge: 2014-07-25 | Disposition: A | Discharge status: Home / Self Care | Payer: BC Managed Care – PPO | Attending: Emergency Medicine | Admitting: Emergency Medicine

## 2014-07-25 ENCOUNTER — Other Ambulatory Visit (HOSPITAL_COMMUNITY): Payer: Self-pay

## 2014-07-25 DIAGNOSIS — R1011 Right upper quadrant pain: Secondary | ICD-10-CM | POA: Insufficient documentation

## 2014-07-25 DIAGNOSIS — F1010 Alcohol abuse, uncomplicated: Secondary | ICD-10-CM | POA: Insufficient documentation

## 2014-07-25 DIAGNOSIS — K759 Inflammatory liver disease, unspecified: Secondary | ICD-10-CM | POA: Insufficient documentation

## 2014-07-25 DIAGNOSIS — F1099 Alcohol use, unspecified with unspecified alcohol-induced disorder: Secondary | ICD-10-CM

## 2014-07-25 DIAGNOSIS — R945 Abnormal results of liver function studies: Secondary | ICD-10-CM

## 2014-07-25 DIAGNOSIS — Z7289 Other problems related to lifestyle: Secondary | ICD-10-CM

## 2014-07-25 DIAGNOSIS — F17210 Nicotine dependence, cigarettes, uncomplicated: Secondary | ICD-10-CM | POA: Insufficient documentation

## 2014-07-25 DIAGNOSIS — R7989 Other specified abnormal findings of blood chemistry: Secondary | ICD-10-CM

## 2014-07-25 DIAGNOSIS — Z789 Other specified health status: Secondary | ICD-10-CM

## 2014-07-25 LAB — BASIC METABOLIC PANEL
ANION GAP: 5 mmol/L (ref 4–13)
BUN/CREA RATIO: 16 (ref 6–22)
BUN: 15 mg/dL (ref 8–25)
CALCIUM: 9.5 mg/dL (ref 8.5–10.4)
CHLORIDE: 104 mmol/L (ref 96–111)
CO2 TOTAL: 27 mmol/L (ref 22–32)
CREATININE: 0.95 mg/dL (ref 0.62–1.27)
ESTIMATED GFR: >59 mL/min/{1.73_m2} (ref >59)
GLUCOSE: 124 mg/dL (ref 65–139)
POTASSIUM: 3.5 mmol/L (ref 3.5–5.1)
SODIUM: 136 mmol/L (ref 136–145)

## 2014-07-25 LAB — CBC WITH DIFF
BASOPHIL #: 0.02 x10ˆ3/uL (ref 0.00–0.20)
BASOPHIL %: 0 %
EOSINOPHIL #: 0.09 x10ˆ3/uL (ref 0.00–0.50)
EOSINOPHIL %: 2 %
HCT: 45.5 % (ref 36.7–47.0)
HGB: 15.3 g/dL (ref 12.5–16.3)
LYMPHOCYTE #: 1.6 x10ˆ3/uL (ref 1.00–4.80)
LYMPHOCYTE %: 33 %
MCH: 29.3 pg (ref 27.4–33.0)
MCHC: 33.6 g/dL (ref 32.5–35.8)
MCV: 87.2 fL (ref 78.0–100.0)
MONOCYTE #: 0.35 x10ˆ3/uL (ref 0.30–1.00)
MONOCYTE %: 7 %
MPV: 8.5 fL (ref 7.5–11.5)
NEUTROPHIL #: 2.74 x10ˆ3/uL (ref 1.50–7.70)
NEUTROPHIL %: 57 %
PLATELETS: 184 x10ˆ3/uL (ref 140–450)
RBC: 5.21 x10ˆ6/uL (ref 4.06–5.63)
RDW: 13 % (ref 12.0–15.0)
WBC: 4.8 x10ˆ3/uL (ref 3.5–11.0)

## 2014-07-25 LAB — HEPATIC FUNCTION PANEL
ALBUMIN: 4 g/dL (ref 3.5–5.0)
ALKALINE PHOSPHATASE: 79 U/L (ref <=150)
ALT (SGPT): 82 U/L — ABNORMAL HIGH (ref <=55)
AST (SGOT): 51 U/L — ABNORMAL HIGH (ref 8–48)
BILIRUBIN DIRECT: 0.3 mg/dL (ref <=0.3)
BILIRUBIN TOTAL: 0.7 mg/dL (ref 0.3–1.3)

## 2014-07-25 LAB — GAMMA GT: GGT: 76 U/L — ABNORMAL HIGH (ref 7–50)

## 2014-07-25 LAB — HEPATITIS B SURFACE ANTIGEN: HBV SURFACE ANTIGEN QUALITATIVE: NEGATIVE

## 2014-07-25 LAB — LIPASE: LIPASE: 23 U/L (ref 10–80)

## 2014-07-25 NOTE — ED Nurses Note (Signed)
Bedside ultrasound completed. SBAR report given to [place], Charity fundraiser. Transfer of patient care at this time.

## 2014-07-25 NOTE — ED Nurses Note (Signed)
Patient given dc papers, iv removed. No further needs at this time.

## 2014-07-25 NOTE — Discharge Instructions (Signed)
Follow up with the [facility] in 2 weeks to recheck liver function tests  Avoid alcohol  If any signs or symptoms of alcohol withdrawal, return to ER (sweating, shaking, vomiting)  If symptoms worsen return to ED

## 2014-07-25 NOTE — Procedures (Addendum)
Encounter Date: 07/25/2014     Emergency Department Procedure:    Gallbladder Ultrasound:    Indication:: RUQ Pain  Negative findings:: No Stones Visualized, No GB Wall Thickening Seen, No Pericholecystic Fluid Seen, No Sonographic Murphy's Sign Noted, CBD Not Dilated, Max CBD Diameter  Max CBD Diameter: 0.3 cm  Summary:: Negative within limits & scope of exam  Attending Physician: Everett Graff        I was physically present for the key portions of obtaining the [place] images, have personally reviewed and interpreted the images and agree with the findings as documented.    Everett Graff, MD

## 2014-07-25 NOTE — ED Provider Notes (Addendum)
[facility] Emergency Department    CC:  Abdominal Pain  HPI:  [name] is a 29 y.o. male with history of IVDU, daily alcohol consumption and daily tylenol use who presents with RUQ pain.  Symptoms started 1 month ago and have worsened in the last 2 days.   Patient called off work yesterday because the pain was too bad.  He decided to come into the ED today because the pain is making it difficult to work.  Associated symptoms include pain that radiates into the back.  No associated nausea, vomiting, fever, change in bowel habits.  No association with food or alcohol.  Patient said the pain is constant then sometimes a sharp cramp on his right side will occur.  He typically drinks 5-6 beers a night and has for at least a year.  He takes 2-3 tylenol per night.  Used to use IV drugs, last time was 2011 (heroin use).  No change in bowel habits.  Last BM was this morning, denies any blood or dark stools.  Denies any history of gastric ulcers.      ROS:  Constitutional: No fever, chills, or weakness   Skin: No rash or diaphoresis  HENT: No headaches or congestion  Eyes: No vision changes   Cardio: No chest pain, palpitations or leg swelling   Respiratory: No cough, wheezing or SOB  GI:  No nausea, vomiting, diarrhea, constipation.  Positive RUQ pain and back pain.  GU:  No dysuria, hematuria, polyuria  MSK: No joint or back pain  Neuro: No loss of sensation, confusion, focal deficits, numbness, tingling  Psychiatric: No mood changes  All other systems reviewed and are negative.    Medications:  Medications Prior to Admission     Prescriptions    cyclobenzaprine (FLEXERIL) 10 mg Oral Tablet    Take 1 Tab (10 mg total) by mouth Three times a day    Patient not taking:  Reported on 07/23/2014    divalproex (DEPAKOTE ER) 500 mg Oral Tablet Sustained Release 24 hr    Take 1 Tab (500 mg total) by mouth Once a day    naproxen (NAPROSYN) 500 mg Oral Tablet    Take 1 Tab (500 mg total) by mouth Twice daily with food    Patient not taking:  Reported on 07/23/2014          Allergies:  Allergies   Allergen Reactions    Penicillins      Allergies reviewed with patient      Past Medical History was reviewed and is negative for gastritis, cholycystecomy, hepatitis    Hx likely alcoholism      History reviewed with patient    Past Surgical History   Procedure Laterality Date    Hx hand surgery             History     Social History    Marital Status: Single     Spouse Name: N/A    Number of Children: N/A    Years of Education: N/A     Occupational History    Not on file.     Social History Main Topics    Smoking status: Current Every Day Smoker -- 1.00 packs/day     Types: Cigarettes    Smokeless tobacco: Never Used    Alcohol Use: Yes    Drug Use: No    Sexual Activity: Not on file     Other Topics Concern    Not on file  Social History Narrative       Family History   Problem Relation Age of Onset    Anesth Problems Neg Hx            Physical Exam:  All nurse's notes reviewed.  ED Triage Vitals   Enc Vitals Group      BP (Non-Invasive) 07/25/14 0953 160/78 mmHg      Heart Rate 07/25/14 0953 97      Respiratory Rate 07/25/14 0953 16      Temperature 07/25/14 0953 37.2 C (99 F)      Temp src --       SpO2-1 07/25/14 0953 98 %      Weight 07/25/14 0953 81.4 kg (179 lb 7.3 oz)      Height 07/25/14 0953 1.753 m (5' 9.02")      Head Cir --       Peak Flow --       Pain Score --       Pain Loc --       Pain Edu? --       Excl. in GC? --      Constitutional: NAD. Sitting comfortably on bed on initial encounter  HENT:   Head: Normocephalic and atraumatic.   Mouth/Throat: Oropharynx is clear and moist.   Eyes: PERRL, EOMI, conjunctivae without discharge bilaterally.  No icterus  Neck: Trachea midline.   Cardiovascular: RRR, No murmurs, rubs or gallops.   Pulmonary/Chest: BS equal bilaterally, good air movement. No respiratory distress. No wheezes, rales or chest tenderness.   Abdominal: BS +. Abdomen soft, no rebound or  guarding.  Minimal pain to palpation of the right upper quadrant.  No CVA tenderness.               Musculoskeletal: No edema, tenderness or deformity.  Skin: warm and dry. No rash, erythema, pallor or cyanosis  Psychiatric: normal mood and affect. Behavior is normal.   Neurological: Alert&Ox3. Grossly intact.     Labs:  Results for orders placed or performed during the hospital encounter of 07/25/14 (from the past 24 hour(s))   CBC/DIFF    Narrative    The following orders were created for panel order CBC/DIFF.  Procedure                               Abnormality         Status                     ---------                               -----------         ------                     CBC WITH ZOXW[[id number]]                                    Final result                 Please view results for these tests on the individual orders.   BASIC METABOLIC PANEL   Result Value Ref Range    SODIUM 136 136-145 mmol/L    POTASSIUM 3.5 3.5-5.1 mmol/L    CHLORIDE 104  96-111 mmol/L    CO2 TOTAL 27 22-32 mmol/L    ANION GAP 5 4-13 mmol/L    CALCIUM 9.5 8.5-10.4 mg/dL    GLUCOSE 161 09-604 mg/dL    BUN 15 5-40 mg/dL    CREATININE 9.81 1.91-4.78 mg/dL    BUN/CREA RATIO 16 2-95    ESTIMATED GFR >59 >59 mL/min/1.60m2   GAMMA GT   Result Value Ref Range    GGT 76 (H) 7-50 U/L   LIPASE   Result Value Ref Range    LIPASE 23 10-80 U/L   HEPATIC FUNCTION PANEL   Result Value Ref Range    ALBUMIN 4.0 3.5-5.0 g/dL    ALKALINE PHOSPHATASE 79 <=150 U/L    ALT (SGPT) 82 (H) <=55 U/L    AST (SGOT) 51 (H) 8-48 U/L    BILIRUBIN TOTAL 0.7 0.3-1.3 mg/dL    BILIRUBIN DIRECT 0.3 <=0.3 mg/dL    PROTEIN TOTAL 7.3 6.2-1.3 g/dL   CBC WITH DIFF   Result Value Ref Range    WBC 4.8 3.5-11.0 x103/uL    RBC 5.21 4.06-5.63 x106/uL    HGB 15.3 12.5-16.3 g/dL    HCT 08.6 57.8-46.9 %    MCV 87.2 78.0-100.0 fL    MCH 29.3 27.4-33.0 pg    MCHC 33.6 32.5-35.8 g/dL    RDW 62.9 52.8-41.3 %    PLATELETS 184 140-450 x103/uL    MPV 8.5 7.5-11.5 fL    NEUTROPHIL % 57  %    LYMPHOCYTE % 33 %    MONOCYTE % 7 %    EOSINOPHIL % 2 %    BASOPHIL % 0 %    NEUTROPHIL # 2.74 1.50-7.70 x103/uL    LYMPHOCYTE # 1.60 1.00-4.80 x103/uL    MONOCYTE # 0.35 0.30-1.00 x103/uL    EOSINOPHIL # 0.09 0.00-0.50 x103/uL    BASOPHIL # 0.02 0.00-0.20 x103/uL   HEPATITIS B CORE ANTIBODY   Result Value Ref Range    HBV CORE TOTAL ANTIBODIES Negative Negative   HEPATITIS B SURFACE ANTIGEN   Result Value Ref Range    HBV SURFACE ANTIGEN QUALITATAIVE Negative Negative       Imaging:    Results for orders placed or performed during the hospital encounter of 07/25/14 (from the past 72 hour(s))   ED [place] LIMITED ABDOMEN     Status: None    Narrative    *Exam not read by Radiology.  *Refer to ED note for result.       The differential diagnosis for this patient includes but is not limited to Cholecystitis, Alcoholic hepatitis, hepatitis b or c (H/O IVDU), acetaminophen toxicity (unlikely). The following orders have been placed.    Orders Placed This Encounter    BEDSIDE  MISC PROCEDURE    ED [place] LIMITED ABDOMEN    CBC/DIFF    BASIC METABOLIC PANEL, NON-FASTING    GAMMA GT    LIPASE    HEPATIC FUNCTION PANEL    CBC WITH DIFF    HEPATITIS C ANTIBODY SCREEN WITH REFLEX TO HCV PCR    HEPATITIS BE ANTIBODY, SERUM    HEPATITIS BE ANTIGEN, SERUM    HEPATITIS B CORE ANTIBODY    HEPATITIS B SURFACE ANTIBODY    HEPATITIS B SURFACE ANTIGEN       [place] Myers, DO 07/25/2014, 12:46    Abnormal Lab results:  Labs Reviewed   GAMMA GT - Abnormal; Notable for the following:     GGT 76 (*)     All other components  within normal limits   HEPATIC FUNCTION PANEL - Abnormal; Notable for the following:     ALT (SGPT) 82 (*)     AST (SGOT) 51 (*)     All other components within normal limits   BASIC METABOLIC PANEL - Normal   LIPASE - Normal   HEPATITIS B CORE ANTIBODY - Normal   HEPATITIS B SURFACE ANTIGEN - Normal   CBC/DIFF    Narrative:     The following orders were created for panel order CBC/DIFF.  Procedure                                Abnormality         Status                     ---------                               -----------         ------                     CBC WITH ZOXW[[id number]]                                    Final result                 Please view results for these tests on the individual orders.   CBC WITH DIFF   HEPATITIS C ANTIBODY SCREEN WITH REFLEX TO HCV PCR   HEPATITIS BE ANTIBODY, SERUM   HEPATITIS BE ANTIGEN, SERUM   HEPATITIS B SURFACE ANTIBODY           Plan: Appropriate labs and imaging ordered. Medical Records reviewed.  RUQ ultrasound.  No stones in the gallbladder or GB wall thickening.  Mild fatty infiltration of the liver seen on exam.    Labs:  Mildly elevated GGT, AST, ALT  Patient will be referred to the family medicine clinic and will follow up in 2 weeks outpatient.  He wants to stop drinking and was told the signs and symptoms of withdrawal and knows to come back to the ER if he experiences any symptoms of withdrawal.  He was advised to try to decrease his tylenol intake as well.      Therapy/Procedures/Course/MDM: Patient was vitally stable throughout visit.  Required no medical therapy for pain control.  Required no medical therapy for nausea control.  Patient had no change in symptoms over course of ED stay.  Laboratory results remarkable for elevated GGT, AST, ALT.  Results were discussed with patient. They were given an opportunity to ask questions.  Consults: none  Impression: Elevated LFTs secondary to alcohol  Disposition:      Following the above history, physical exam, and studies, the patient was deemed stable and suitable for discharge and he will follow up in 2 weeks with family medicine. Prescriptions were written for none.  Medication instructions were discussed with the patient/patient's family. Patient was advised to return to the ED with any new, concerning or worsening symptoms and follow up as directed. The patient verbalized understanding of all instructions and  had no further questions or concerns.       [place] Myers, DO 07/25/2014, 12:46  PGY I    Department  of Anesthesiology

## 2014-07-25 NOTE — ED Nurses Note (Signed)
Patient came to the ED today for chief complaint of RUQ pain. He reports he had labs drawn at [facility] two days ago and was told his liver enzymes were elevated. Denies nausea, vomiting, or any changes in bowel/bladder. PIV access established and labs sent per MD orders.

## 2014-07-26 LAB — HEPATITIS C VIRUS (HCV) RNA DETECTION AND QUANTIFICATION, PCR, PLASMA
HCV QUANTITATIVE PCR: 4529937 [IU]/mL — ABNORMAL HIGH
HCV QUANTITATIVE RNA LOG: 6.66 {Log} — ABNORMAL HIGH

## 2014-07-26 LAB — HEPATITIS BE ANTIBODY, SERUM: HEPATITIS BE ANTIBODY, SERUM: NEGATIVE

## 2014-07-26 LAB — HEPATITIS BE ANTIGEN, SERUM: HEPATITIS BE ANTIGEN: NEGATIVE

## 2014-07-29 ENCOUNTER — Emergency Department
Admission: EM | Admit: 2014-07-29 | Discharge: 2014-07-29 | Disposition: A | Discharge status: Home / Self Care | Payer: BC Managed Care – PPO | Attending: Physician Assistant | Admitting: Physician Assistant

## 2014-07-29 ENCOUNTER — Emergency Department (HOSPITAL_COMMUNITY): Admission: RE | Admit: 2014-07-29 | Payer: BC Managed Care – PPO | Source: Ambulatory Visit

## 2014-07-29 ENCOUNTER — Emergency Department (HOSPITAL_COMMUNITY): Payer: BC Managed Care – PPO

## 2014-07-29 ENCOUNTER — Emergency Department (EMERGENCY_DEPARTMENT_HOSPITAL)
Admission: RE | Admit: 2014-07-29 | Discharge: 2014-07-29 | Disposition: A | Discharge status: Home / Self Care | Payer: BC Managed Care – PPO | Source: Ambulatory Visit

## 2014-07-29 ENCOUNTER — Encounter (HOSPITAL_COMMUNITY): Payer: Self-pay

## 2014-07-29 DIAGNOSIS — M25511 Pain in right shoulder: Secondary | ICD-10-CM

## 2014-07-29 DIAGNOSIS — S42121A Displaced fracture of acromial process, right shoulder, initial encounter for closed fracture: Secondary | ICD-10-CM | POA: Insufficient documentation

## 2014-07-29 DIAGNOSIS — S43101A Unspecified dislocation of right acromioclavicular joint, initial encounter: Secondary | ICD-10-CM

## 2014-07-29 DIAGNOSIS — F17210 Nicotine dependence, cigarettes, uncomplicated: Secondary | ICD-10-CM | POA: Insufficient documentation

## 2014-07-29 DIAGNOSIS — X58XXXA Exposure to other specified factors, initial encounter: Secondary | ICD-10-CM | POA: Insufficient documentation

## 2014-07-29 MED ORDER — HYDROCODONE 5 MG-ACETAMINOPHEN 325 MG TABLET
1.00 | ORAL_TABLET | ORAL | Status: DC | PRN
Start: 2014-07-29 — End: 2017-05-20

## 2014-07-29 NOTE — ED Provider Notes (Addendum)
Department of Emergency Medicine    Attending: Dr. Collene Mares  CC: R shoulder injury  History provided by Patient    HPI:  [name] is a 29 y.o. male presenting to the ED via POV with a R shoulder injury. Pt was throwing 5 gallon buckets of drywall mud into a dumptruck at home yesterday and injured his R shoulder. He states he had immediate pain with associated "cracking". No other injuries, specifically no head injury, no LOC, no neck or back pain. Pt denies hx of surgical intervention on that shoulderNo significant PMHx.     ROS:  Constitutional: No fever or chills  Cardio: No chest pain  Respiratory: No cough or SOB  GI:  No nausea, vomiting  MSK: No back pain + R shoulder pain   Neuro: No numbness or tingling  All other systems reviewed and are negative.    History:  PMH:  History reviewed. No pertinent past medical history.    Previous Medications    DIVALPROEX (DEPAKOTE ER) 500 MG ORAL TABLET SUSTAINED RELEASE 24 HR    Take 1 Tab (500 mg total) by mouth Once a day     PSH:    Past Surgical History   Procedure Laterality Date    Hx hand surgery           Social Hx:    History     Social History    Marital Status: Single     Spouse Name: N/A    Number of Children: N/A    Years of Education: N/A     Occupational History    Not on file.     Social History Main Topics    Smoking status: Current Every Day Smoker -- 1.00 packs/day     Types: Cigarettes    Smokeless tobacco: Never Used    Alcohol Use: No      Comment: denies    Drug Use: No    Sexual Activity: Not on file     Other Topics Concern    Not on file     Social History Narrative     Family Hx:   Family History   Problem Relation Age of Onset    Anesth Problems Neg Hx      Allergies:   Allergies   Allergen Reactions    Penicillins        Above history reviewed with patient, changes are as documented.      PE:  Nursing note and vitals reviewed.    Filed Vitals:    07/29/14 1607   BP: 120/75   Pulse: 95   Temp: 37.1 C (98.8 F)   Resp: 18    Height: 1.753 m (5' 9.02")   Weight: 83.915 kg (185 lb)   SpO2: 99%       Constitutional: Pt is alert and oriented and appears well-developed and well-nourished. No acute distress.   HENT:   Mouth/Throat: Oropharynx is clear and moist.   Eyes: Conjunctivae and extraocular motions are normal.  Neck: Normal range of motion. Neck supple.    Cardiovascular: Normal rate, regular rhythm and intact distal pulses.    Pulmonary/Chest: Effort normal and breath sounds normal. No respiratory distress.   Abdominal: Abdomen is soft, nontender, nondistended.    Musculoskeletal: Limited ROM in R shoulder, most notably in flexion. TTP over superior joint line and AC joint w/o obvious deformity.   Neurological: Pt is alert and oriented. Able to make ok sign, thumbs up, spread fingers and cross  fingers. SILT  Skin: Skin is warm and dry without rash.  Color normal.  No wound or ecchymosis.       Course  MDM      Impression/Plan: 29 y.o. male presenting with R shoulder injury concerning for, but not limited to, rotator cuff tear vs fracture vs AC joint separation. Medical Records reviewed.     Will obtain the following labs/imaging and give pt the following medications to alleviate symptoms:   Orders Placed This Encounter    CANCELED: XR SHOULDER RIGHT    CANCELED: XR CLAVICLE RIGHT    XR SHOULDER RIGHT W AXIAL CLAVICLE    SCHEDULE FOLLOW-UP ORTHOP[facility]EDICS - [facility]    HYDROcodone-acetaminophen (NORCO) 5-325 mg Oral Tablet       Labs Reviewed - No data to display  All labs were reviewed.    Radiographical Imaging:  Results for orders placed or performed during the hospital encounter of 07/29/14 (from the past 72 hour(s))   XR SHOULDER RIGHT W AXIAL CLAVICLE     Status: None (Preliminary result)    Narrative    CLINICAL HISTORY: 29 y.o. male with lifting injury.    TECHNIQUE: XR SHOULDER RIGHT W AXIAL CLAVICLE 5 views performed on Jul 29, 2014  4:35 PM.    COMPARISON: Shoulder radiographs from September 09, 2013.    FINDINGS: There is widening of the acromioclavicular distance compatible   with a grade 2 A.C. joint separation.  A bony fragment is also noted   adjacent to the distal right clavicle which is likely related to an   avulsion fracture.  Glenohumeral joint is preserved.  No additional acute   bony abnormality is seen.      Impression     Grade 2 right A.C. joint separation with avulsion fracture of   the distal right clavicle.           Therapy/Procedures/Course/MDM:    Patient was vitally stable throughout visit.     Imaging showed Grade 2 right A.C joint separation with avulsion fracture of the distal right clavicle.    Results were discussed with patient. They were given an opportunity to ask questions.   Plan for pain control, f/u to orthopedics and sling.   Consults: None    Disposition: Discharged  Following the above history, physical exam, and studies, the patient was deemed stable and suitable for discharge.  Medication instructions were discussed with the patient/patient's family. Patient was advised to return to the ED with any new, concerning or worsening symptoms and follow up as directed. The patient verbalized understanding of all instructions and had no further questions or concerns.     Clinical Impression:   Encounter Diagnosis   Name Primary?    AC separation, type 2, right, initial encounter Yes       Follow Up:   [facility], [facility]    [address]  [place] [address]  [id number]          Prescriptions:   New Prescriptions    HYDROCODONE-ACETAMINOPHEN (NORCO) 5-325 MG ORAL TABLET    Take 1 Tab by mouth Every 4 hours as needed for Pain       I am scribing for, and in the presence of, Huston Foley, [place], for services provided on 07/29/2014.  Teena Dunk, SCRIBE     I personally performed the services described in this documentation, as scribed in my presence, and it is both accurate and complete.  Huston Foley, PA-C  The co-signing  faculty was physically present in the emergency department and available for consultation and did not particpate in the care of this patient.  Huston Foley, PA-C  07/31/2014, 17:40        [facility]            I was physically present and available for consultation from the [facility] that evaluated the patient in the vertical care process, however, I was not consulted to personally evaluate the patient during their evaluation in the Emergency Department during today's visit.     Montey Hora, MD

## 2014-07-29 NOTE — Discharge Instructions (Signed)
Acromioclavicular Injuries  The AC (acromioclavicular) joint is the joint in the shoulder where the collarbone (clavicle) meets the shoulder blade (scapula). The part of the shoulder blade connected to the collarbone is called the acromion. Common problems with and treatments for the AC joint are detailed below.  ARTHRITIS  Arthritis occurs when the joint has been injured and the smooth padding between the joints (cartilage) is lost. This is the wear and tear seen in most joints of the body if they have been overused. This causes the joint to produce pain and swelling which is worse with activity.   AC JOINT SEPARATION  AC joint separation means that the ligaments connecting the acromion of the shoulder blade and collarbone have been damaged, and the two bones no longer line up. AC separations can be anywhere from mild to severe, and are "graded" depending upon which ligaments are torn and how badly they are torn.   Grade I Injury: the least damage is done, and the AC joint still lines up.   Grade II Injury: damage to the ligaments which reinforce the AC joint. In a Grade II injury, these ligaments are stretched but not entirely torn. When stressed, the AC joint becomes painful and unstable.   Grade III Injury: AC and secondary ligaments are completely torn, and the collarbone is no longer attached to the shoulder blade. This results in deformity; a prominence of the end of the clavicle.  AC JOINT FRACTURE  AC joint fracture means that there has been a break in the bones of the AC joint, usually the end of the clavicle.  TREATMENT  TREATMENT OF AC ARTHRITIS   There is currently no way to replace the cartilage damaged by arthritis. The best way to improve the condition is to decrease the activities which aggravate the problem. Application of ice to the joint helps decrease pain and soreness (inflammation). The use of non-steroidal anti-inflammatory medication is helpful.   If less conservative measures do not  work, then cortisone shots (injections) may be used. These are anti-inflammatories; they decrease the soreness in the joint and swelling.   If non-surgical measures fail, surgery may be recommended. The procedure is generally removal of a portion of the end of the clavicle. This is the part of the collarbone closest to your acromion which is stabilized with ligaments to the acromion of the shoulder blade. This surgery may be performed using a tube-like instrument with a light (arthroscope) for looking into a joint. It may also be performed as an open surgery through a small incision by the surgeon. Most patients will have good range of motion within 6 weeks and may return to all activity including sports by 8-12 weeks, barring complications.  TREATMENT OF AN AC SEPARATION   The initial treatment is to decrease pain. This is best accomplished by immobilizing the arm in a sling and placing an ice pack to the shoulder for 20 to 30 minutes every 2 hours as needed. As the pain starts to subside, it is important to begin moving the fingers, wrist, elbow and eventually the shoulder in order to prevent a stiff or "frozen" shoulder. Instruction on when and how much to move the shoulder will be provided by your caregiver. The length of time needed to regain full motion and function depends on the amount or grade of the injury. Recovery from a Grade I AC separation usually takes 10 to 14 days, whereas a Grade III may take 6 to 8 weeks.   Grade   I and II separations usually do not require surgery. Even Grade III injuries usually allow return to full activity with few restrictions. Treatment is also based on the activity demands of the injured shoulder. For example, a high level quarterback with an injured throwing arm will receive more aggressive treatment than someone with a desk job who rarely uses his/her arm for strenuous activities. In some cases, a painful lump may persist which could require a later surgery. Surgery  can be very successful, but the benefits must be weighed against the potential risks.  TREATMENT OF AN AC JOINT FRACTURE  Fracture treatment depends on the type of fracture. Sometimes a splint or sling may be all that is required. Other times surgery may be required for repair. This is more frequently the case when the ligaments supporting the clavicle are completely torn. Your caregiver will help you with these decisions and together you can decide what will be the best treatment.  HOME CARE INSTRUCTIONS    Apply ice to the injury for 15-20 minutes each hour while awake for 2 days. Put the ice in a plastic bag and place a towel between the bag of ice and skin.   If a sling has been applied, wear it constantly for as long as directed by your caregiver, even at night. The sling or splint can be removed for bathing or showering or as directed. Be sure to keep the shoulder in the same place as when the sling is on. Do not lift the arm.   If a figure-of-eight splint has been applied it should be tightened gently by another person every day. Tighten it enough to keep the shoulders held back. Allow enough room to place the index finger between the body and strap. Loosen the splint immediately if there is numbness or tingling in the hands.   Take over-the-counter or prescription medicines for pain, discomfort or fever as directed by your caregiver.   If you or your child has received a follow up appointment, it is very important to keep that appointment in order to avoid long term complications, chronic pain or disability.  SEEK MEDICAL CARE IF:    The pain is not relieved with medications.   There is increased swelling or discoloration that continues to get worse rather than better.   You or your child has been unable to follow up as instructed.   There is progressive numbness and tingling in the arm, forearm or hand.  SEEK IMMEDIATE MEDICAL CARE IF:    The arm is numb, cold or pale.   There is increasing pain  in the hand, forearm or fingers.  MAKE SURE YOU:    Understand these instructions.   Will watch your condition.   Will get help right away if you are not doing well or get worse.     This information is not intended to replace advice given to you by your health care provider. Make sure you discuss any questions you have with your health care provider.     Document Released: 09/30/2004 Document Revised: 03/15/2011 Document Reviewed: 03/25/2008  Elsevier Interactive Patient Education 2016 [employer]

## 2014-07-29 NOTE — ED Nurses Note (Signed)
Pt ambulatory to xray without difficulty from waiting room

## 2014-07-29 NOTE — ED Nurses Note (Signed)
Discharge instructions reviewed and arm sling applied. Patient verbalized discharge instructions and ambulated to the exit.

## 2014-07-30 NOTE — ED Attending Note (Signed)
I saw and examined the patient.  I directly supervised the patient's care.  I discussed the patient's care with the resident/midlevel and I agree with the findings, diagnosis, and plan as documented in the primary note.  Any exceptions, additions, or corrections are noted below.    MDM - re-evaluated pt, reviewed old records, reviewed labs, reviewed imaging, IV therapy was given, reviewed and interpreted ECGs    Hepatitis, likely alcoholic  Encouraged drinking help  Offered admission, does not want admission  Give outpatient referral info    Procedures: bedside US

## 2014-08-01 ENCOUNTER — Encounter (HOSPITAL_COMMUNITY): Payer: Self-pay

## 2014-08-01 ENCOUNTER — Emergency Department (HOSPITAL_COMMUNITY): Payer: BC Managed Care – PPO

## 2014-08-01 ENCOUNTER — Emergency Department
Admission: EM | Admit: 2014-08-01 | Discharge: 2014-08-01 | Disposition: A | Discharge status: Home / Self Care | Payer: BC Managed Care – PPO | Attending: Obstetrics & Gynecology | Admitting: Obstetrics & Gynecology

## 2014-08-01 DIAGNOSIS — X58XXXD Exposure to other specified factors, subsequent encounter: Secondary | ICD-10-CM | POA: Insufficient documentation

## 2014-08-01 DIAGNOSIS — S43101D Unspecified dislocation of right acromioclavicular joint, subsequent encounter: Secondary | ICD-10-CM | POA: Insufficient documentation

## 2014-08-01 DIAGNOSIS — S43102D Unspecified dislocation of left acromioclavicular joint, subsequent encounter: Secondary | ICD-10-CM

## 2014-08-01 DIAGNOSIS — T733XXA Exhaustion due to excessive exertion, initial encounter: Secondary | ICD-10-CM

## 2014-08-01 MED ORDER — HYDROCODONE 5 MG-ACETAMINOPHEN 325 MG TABLET
ORAL_TABLET | ORAL | Status: DC
Start: 2014-08-01 — End: 2014-08-01
  Filled 2014-08-01: qty 1

## 2014-08-01 MED ORDER — HYDROCODONE 5 MG-ACETAMINOPHEN 325 MG TABLET
1.0000 | ORAL_TABLET | ORAL | Status: AC
Start: 2014-08-01 — End: 2014-08-01
  Administered 2014-08-01: 1 via ORAL

## 2014-08-01 MED ORDER — TRAMADOL 50 MG TABLET
50.00 mg | ORAL_TABLET | Freq: Four times a day (QID) | ORAL | Status: DC | PRN
Start: 2014-08-01 — End: 2017-05-20

## 2014-08-01 NOTE — Discharge Instructions (Signed)
Date Time Provider Department Center   08/05/2014 10:30 AM Ernestine Conrad, MD [facility]            Shoulder Separation  A shoulder separation (acromioclavicular separation) is an injury to the connecting tissue (ligament) between the top of your shoulder blade (acromion) and your collarbone (clavicle).  The ligament may be stretched, partially torn, or completely torn.   A stretched ligament may not cause very much pain, and it does not move the collarbone out of place. A stretched ligament looks normal on an X-ray.   An injury that is a bit worse may partially tear a ligament and move the collarbone slightly out of place.   A serious injury completely tears both shoulder ligaments. This moves the collarbone severely out of position and changes the way that the shoulder looks (deformity).  CAUSES  The most common cause of a shoulder separation is falling on or receiving a blow to the top of the shoulder. Falling with an outstretched arm may also cause this injury.  RISK FACTORS  You may be at greater risk of a shoulder separation if:   You are male.   You are younger than age 72.   You play a contact sport, such as football or hockey.  SIGNS AND SYMPTOMS  The most common symptom of a shoulder separation is pain on the top of the shoulder after falling on it or receiving a blow to it. Other signs and symptoms include:   Shoulder deformity.   Swelling of the shoulder.   Decreased ability to move the shoulder.   Bruising on top of the shoulder.  DIAGNOSIS  Your health care provider may suspect a shoulder separation based on your symptoms and the details of a recent injury. A physical exam will be done. During this exam, the health care provider may:   Press on your shoulder.   Test the movement of your shoulder.   Ask you to hold a weight in your hand to see if the separation increases.   Do an X-ray.  TREATMENT   A stretch injury may require only a sling, pain medicine, and cold packs. This  treatment may last for 2-12 weeks. You may also have physical therapy. A physical therapist will teach you to do daily exercises to strengthen your shoulder muscles and prevent stiffness.   A complete tear may require surgery to repair the torn ligament. After surgery, you will also require a sling, pain medicine, and cold packs. Recovery may take longer. You may also need more physical therapy.  HOME CARE INSTRUCTIONS   Take medicines only as directed by your health care provider.   Apply ice to the top of your shoulder:   Put ice in a plastic bag.   Place a towel between your skin and the bag.   Leave the ice on for 20 minutes, 2-3 times a day.   Wear your sling or splint as directed by your health care provider.   You may be able to remove your sling to do your physical therapy exercises.   Ask your health care provider when you can stop wearing the sling.   Do not do any activities that make your pain worse.   Do not lift anything that is heavier than 10 lb (4.5 kg) on the injured side of your body.   Ask your health care provider when you can return to athletic activities.  SEEK MEDICAL CARE IF:   Your pain medicine is  not relieving your pain.   Your pain and stiffness are not improving after 2 weeks.   You are unable to do your physical therapy exercises because of pain or stiffness.     This information is not intended to replace advice given to you by your health care provider. Make sure you discuss any questions you have with your health care provider.     Document Released: 09/30/2004 Document Revised: 01/11/2014 Document Reviewed: 05/23/2013  Elsevier Interactive Patient Education [employer]

## 2014-08-01 NOTE — ED Nurses Note (Signed)
Pt was working outside, pt was moving buckets and watching his daughter, pt was moving a bucket and he felt a pop in the right shoulder.  Pt came here Saturday for evaluation.  Pt is back in today for increase in shoulder pain and pt took his last pain medication last evening.

## 2014-08-01 NOTE — ED Provider Notes (Addendum)
Department of Emergency Medicine    Attending Physician: Dr. Ralene Cork  Resident Physician/Mid-level provider: Rob Hickman, APRN    CC:   Chief Complaint   Patient presents with    Shoulder Injury     recheck of right shoulder inj , ortho has not gotten in touch with him for follow up , needs pain meds        HPI  [name] is a 29 y.o. male who presents to the ED with c/o shoulder pain.  He was seen in the ED 4 days ago with Grade 2 AC separation and right clavicle avulsion fracture from throwing heavy buckets.  Today he reports he ran out of his pain medication and he attempted to return to work.  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  He reports he was unable to schedule appt with Ortho. He denies any new injury.  No other complaints today.      Review of Systems  No fever, chills, n/v/d. +Shoulder pain      History:   PMH:  History reviewed. No pertinent past medical history.  PSH:    Past Surgical History   Procedure Laterality Date    Hx hand surgery       Social Hx:    History   Substance Use Topics    Smoking status: Current Every Day Smoker -- 1.00 packs/day     Types: Cigarettes    Smokeless tobacco: Never Used    Alcohol Use: No      Comment: denies     Family Hx:   Family History   Problem Relation Age of Onset    Anesth Problems Neg Hx      Allergies:   Allergies   Allergen Reactions    Penicillins        Above history reviewed with patient, changes are as documented.    Physical Exam   Nursing notes reviewed.    ED Triage Vitals   Enc Vitals Group      BP (Non-Invasive) 08/01/14 0804 130/94 mmHg      Heart Rate 08/01/14 0804 98      Respiratory Rate 08/01/14 0804 16      Temperature 08/01/14 0804 36.2 C (97.2 F)      Temp src --       SpO2-1 08/01/14 0804 99 %      Weight 08/01/14 0804 81 kg (178 lb 9.2 oz)      Height 08/01/14 0804 1.753 m (5' 9.02")     Constitutional: NAD.   HENT:   Head: Normocephalic and atraumatic.   Mouth/Throat: Oropharynx is clear and moist.    Eyes: EOMI, PERRL   Neck: Trachea midline. Neck supple.  Musculoskeletal: Pain with ROM of right shoulder.  TTP to clavicle and AC joint.  No bruising or swelling noted.    Skin: warm and dry. No rash  Psychiatric: normal mood and affect. Behavior is normal.   Neurological: Alert and oriented. Grossly intact.      Course  MDM      Impression: 29 y.o. male presenting with shoulder pain with known AC joint dislocation and clavicle fracture.     Plan: Medical Records reviewed. Will obtain the following labs/imaging and administer the following medications to alleviate symptoms:   Orders Placed This Encounter    HYDROcodone-acetaminophen (NORCO) 5-325 mg per tablet    HYDROcodone-acetaminophen (NORCO) tablet ---Cabinet Override    traMADol (ULTRAM) 50 mg Oral Tablet  Labs Reviewed - No data to display  All labs were reviewed.    Radiographical Imaging:       Course:   Ibuprofen 800 mg every 8 hours for pain.  Ultram as needed for pain.  Continue to wear sling.  Work slip given until evaluation by Ortho.  He has an appt with Dr Mercy Riding on Monday.    Following the above history, physical exam, and studies, the patient was deemed stable and suitable for discharge. Pt given Ultram. Medication instructions were discussed with the pt. The patient verbalized understanding of all instructions and had no further questions or concerns. Pt advised to follow up with Ortho next week. Pt also advised to return to the ED with any new or worsening symptoms.       Disposition: Discharged    Clinical Impression:   Encounter Diagnosis   Name Primary?    AC separation, type 2, left, subsequent encounter Yes     Follow Up:   [facility]  [address]  [place] [address]  [phone]    As needed, If symptoms worsen    Ernestine Conrad, MD  [address] [place] [address]  [phone]    In 4 days      Prescriptions:   Discharge Medication List as of 08/01/2014  8:26 AM      START  taking these medications    Details   traMADol (ULTRAM) 50 mg Oral Tablet Take 1 Tab (50 mg total) by mouth Every 6 hours as needed, Disp-20 Tab, R-0, Print             Future Appointments  Date Time Provider Department Center   08/05/2014 10:30 AM Ernestine Conrad, MD [facility] [place] [facility]   08/08/2014 2:30 PM Ardeen Jourdain, MD [facility]         The co-signing faculty was physically present in ED and available for consultation and did not participate in the care of this patient.      Rob Hickman, APRN  Rob Hickman, APRN  08/01/2014, 08:16                HPI  Review of Systems  Physical Exam  Course      I was physically present in the Emergency Department and available for consultation.

## 2014-08-01 NOTE — ED Nurses Note (Signed)
Patient discharged home with family.  AVS reviewed with patient/care giver.  A written copy of the AVS and discharge instructions was given to the patient/care giver.  Questions sufficiently answered as needed.  Patient/care giver encouraged to follow up with PCP as indicated.  In the event of an emergency, patient/care giver instructed to call 911 or go to the nearest emergency room. PIV was removed, discharge instructions and prescriptions given to pt.  Pt walked out of ED  Shepard General, RN  08/01/2014, 08:29

## 2014-08-01 NOTE — ED Attending Note (Signed)
I was physically present in the Emergency Department and available for consultation. However, I did not see or examine this patient.

## 2014-08-05 ENCOUNTER — Encounter (HOSPITAL_BASED_OUTPATIENT_CLINIC_OR_DEPARTMENT_OTHER): Payer: Self-pay | Admitting: ORTHOPEDIC, SPORTS MEDICINE

## 2014-08-05 ENCOUNTER — Ambulatory Visit: Payer: BC Managed Care – PPO | Attending: ORTHOPEDIC, SPORTS MEDICINE | Admitting: ORTHOPEDIC, SPORTS MEDICINE

## 2014-08-05 VITALS — BP 148/94 | HR 95 | Temp 98.4°F

## 2014-08-05 DIAGNOSIS — Z880 Allergy status to penicillin: Secondary | ICD-10-CM | POA: Insufficient documentation

## 2014-08-05 DIAGNOSIS — S43109A Unspecified dislocation of unspecified acromioclavicular joint, initial encounter: Secondary | ICD-10-CM | POA: Insufficient documentation

## 2014-08-05 DIAGNOSIS — F17200 Nicotine dependence, unspecified, uncomplicated: Secondary | ICD-10-CM | POA: Insufficient documentation

## 2014-08-05 DIAGNOSIS — S43101A Unspecified dislocation of right acromioclavicular joint, initial encounter: Secondary | ICD-10-CM

## 2014-08-05 MED ORDER — NAPROXEN 500 MG TABLET
500.00 mg | ORAL_TABLET | Freq: Two times a day (BID) | ORAL | Status: DC
Start: 2014-08-05 — End: 2017-05-20

## 2014-08-05 MED ORDER — ACETAMINOPHEN 300 MG-CODEINE 30 MG TABLET
1.00 | ORAL_TABLET | ORAL | Status: DC | PRN
Start: 2014-08-05 — End: 2017-05-20

## 2014-08-05 NOTE — ED Attending Note (Signed)
I have seen and evaluated the patient with the resident/MLP and have directly supervised the patient's care.  We have discussed the patient's care and I agree with the evaluation, plan of care, and disposition planning as documented in the note.  Any clarifications, exceptions, or additional information, if indicated, will be included below.     My findings are as below.     [name] Shaun Novah Nessel, MD

## 2014-08-06 NOTE — H&P (Addendum)
[facility]                              DEPARTMENT OF [place], [place] [address]                                PATIENT NAME: [name], [name]  HOSPITAL UEAVWU:[id number]  DATE OF SERVICE:08/05/2014  DATE OF BIRTH: Mar 24, 1985    HISTORY AND PHYSICAL    CHIEF COMPLAINT:  Right shoulder pain.    HISTORY OF PRESENT ILLNESS:  [name] is a 28 year old male who states that on July 28, 2014, he was throwing some heavy buckets around with his father-in-law when he heard a pop in the midst of throwing a very heavy bucket.  He had immediate pain to his right shoulder and was unable to really use it to any type of extent over his head at that time.  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  The patient did attempt to go back to work the other day; however, he was unable to just use his left arm.  The patient describes it as a sharp, achy-type of pain.  He is really unable to sleep on it at this time.  He denies any numbness and tingling associated with it.  He went to the Emergency Department where they evaluated him and told him to have a grade II AC separation.  He was placed in a sling and scheduled to follow up with Dr. Mercy Riding.  The patient has not undergone any physical therapy and does take over-the-counter pain medication for his symptoms.  No recent fever, chills, night sweats, nausea or vomiting.    PAST MEDICAL HISTORY:  None.    PAST SURGICAL HISTORY:  Hand surgery in the past.    SOCIAL HISTORY:  The patient does smoke a pack a day, does not chew tobacco, does not use any alcohol.    ALLERGIES:  The patient does have an allergy to PENICILLIN, but he does not know the reaction.    FAMILY HISTORY:  None.    MEDICATIONS:  None.    REVIEW OF SYSTEMS:  Again, the patient denies any recent  fevers or chills, night sweats, nausea and vomiting.  All other systems were reviewed and were negative.    PHYSICAL EXAMINATION:    Vitals:  Blood pressure 148/94, pulse 95, temperature 36.9 degrees Celsius.    General:  He is calm, cooperative and alert during physical exam.    HEENT:  Grossly normal, no drainage from nasopharynx or oropharynx.  He has unlabored breathing.  Abdomen:  Nondistended.    Extremities:  Further examination of the right shoulder reveals no obvious deformity on visual examination.  He does have some tenderness to palpation over the [facility] joint.  The patient really is limited as far as his range of motion is concerned and really can only get passively to about 90 degrees of forward flexion, maybe 85 degrees of abduction before pain is elicited and hinders his progression.  The patient is neurovascularly intact to the right  upper extremity and is able to form an okay sign, thumbs-up and abduct his fingers with no issues.  He has 2+ radial and ulnar pulses.    IMAGING:  Reviewed on PACS and does reveal a right shoulder AC separation with a small avulsion off the distal aspect of the clavicle.  This does appear possibly chronic versus subacute.    ASSESSMENT:  Grade II AC separation.    PLAN:  At this point, we are going to go ahead and get the patient set up with some physical therapy to work on range of motion.  We also gave him a work excuse to stay off work regarding his injury until further evaluation.  The patient was also given some naproxen for pain control as well as Tylenol with codeine which he can take as needed.  We would like to see the patient back in approximately 6 weeks to see how he is doing and possibly hopefully release him to full duty at that time if he is doing well.  All questions were answered at this time.      Burley Saver, MD  Resident  [facility]opa[facility]    Maggie Font, MD  Assistant Professor  [facility] Department of Orthopaedics    YQ/[id number]; D:  08/05/2014 16:48:07; T: 08/06/2014 20:05:44           I saw and examined the patient.  I reviewed the resident's note.  I agree with the findings and plan of care as documented in the resident's note.  Any exceptions/additions are edited/noted.    Ernestine Conrad, MD 08/08/2014, 10:18

## 2014-08-08 ENCOUNTER — Encounter (HOSPITAL_COMMUNITY): Payer: BC Managed Care – PPO | Admitting: Psychiatry

## 2014-08-22 ENCOUNTER — Encounter (HOSPITAL_BASED_OUTPATIENT_CLINIC_OR_DEPARTMENT_OTHER): Payer: BC Managed Care – PPO | Admitting: ORTHOPEDIC, SPORTS MEDICINE

## 2014-11-26 ENCOUNTER — Encounter: Payer: 59 | Admitting: Medical

## 2014-12-23 ENCOUNTER — Ambulatory Visit (INDEPENDENT_AMBULATORY_CARE_PROVIDER_SITE_OTHER): Payer: 59 | Admitting: Medical

## 2014-12-23 ENCOUNTER — Encounter: Payer: Self-pay | Admitting: Medical

## 2014-12-23 VITALS — BP 178/130 | HR 75 | Ht 73.5 in | Wt 346.0 lb

## 2014-12-23 DIAGNOSIS — Z87891 Personal history of nicotine dependence: Secondary | ICD-10-CM | POA: Diagnosis not present

## 2014-12-23 DIAGNOSIS — R0681 Apnea, not elsewhere classified: Secondary | ICD-10-CM | POA: Diagnosis not present

## 2014-12-23 DIAGNOSIS — I10 Essential (primary) hypertension: Secondary | ICD-10-CM | POA: Diagnosis not present

## 2014-12-23 DIAGNOSIS — R0683 Snoring: Secondary | ICD-10-CM | POA: Diagnosis not present

## 2014-12-23 DIAGNOSIS — E785 Hyperlipidemia, unspecified: Secondary | ICD-10-CM

## 2014-12-23 DIAGNOSIS — G479 Sleep disorder, unspecified: Secondary | ICD-10-CM

## 2014-12-23 LAB — POCT URINALYSIS DIPSTICK
Bilirubin, UA: NEGATIVE
Blood, UA: NEGATIVE
GLUCOSE UA: NEGATIVE
KETONES UA: NEGATIVE
Leukocytes, UA: NEGATIVE
Nitrite, UA: NEGATIVE
Protein, UA: NEGATIVE
Urobilinogen, UA: NEGATIVE
pH, UA: 6

## 2014-12-23 LAB — COMPREHENSIVE METABOLIC PANEL
ALT: 30 U/L (ref 9–46)
AST: 21 U/L (ref 10–40)
Albumin: 4.2 g/dL (ref 3.6–5.1)
Alkaline Phosphatase: 59 U/L (ref 40–115)
BILIRUBIN TOTAL: 0.5 mg/dL (ref 0.2–1.2)
BUN: 15 mg/dL (ref 7–25)
CALCIUM: 9.3 mg/dL (ref 8.6–10.3)
CHLORIDE: 99 mmol/L (ref 98–110)
CO2: 26 mmol/L (ref 20–31)
Creat: 1.07 mg/dL (ref 0.60–1.35)
Glucose, Bld: 100 mg/dL — ABNORMAL HIGH (ref 65–99)
Potassium: 4.4 mmol/L (ref 3.5–5.3)
Sodium: 136 mmol/L (ref 135–146)
Total Protein: 7.7 g/dL (ref 6.1–8.1)

## 2014-12-23 LAB — CBC
HCT: 42.4 % (ref 39.0–52.0)
Hemoglobin: 13.6 g/dL (ref 13.0–17.0)
MCH: 26.2 pg (ref 26.0–34.0)
MCHC: 32.1 g/dL (ref 30.0–36.0)
MCV: 81.7 fL (ref 78.0–100.0)
MPV: 10 fL (ref 8.6–12.4)
PLATELETS: 371 10*3/uL (ref 150–400)
RBC: 5.19 MIL/uL (ref 4.22–5.81)
RDW: 13 % (ref 11.5–15.5)
WBC: 6.5 10*3/uL (ref 4.0–10.5)

## 2014-12-23 LAB — LIPID PANEL
CHOLESTEROL: 254 mg/dL — AB (ref 125–200)
HDL: 43 mg/dL (ref >=40)
LDL Cholesterol: 157 mg/dL — ABNORMAL HIGH (ref <130)
TRIGLYCERIDES: 268 mg/dL — AB (ref <150)
Total CHOL/HDL Ratio: 5.9 Ratio — ABNORMAL HIGH (ref <=5.0)
VLDL: 54 mg/dL — AB (ref <30)

## 2014-12-23 LAB — HEMOGLOBIN A1C
Hgb A1c MFr Bld: 5.8 % — ABNORMAL HIGH (ref <5.7)
Mean Plasma Glucose: 120 mg/dL — ABNORMAL HIGH (ref <117)

## 2014-12-23 MED ORDER — AMLODIPINE BESYLATE 10 MG PO TABS: 10.0000 mg | ORAL_TABLET | Freq: Every day | ORAL | Status: DC

## 2014-12-23 MED ORDER — LISINOPRIL-HYDROCHLOROTHIAZIDE 20-12.5 MG PO TABS: 1.0000 | ORAL_TABLET | Freq: Every day | ORAL | Status: DC

## 2014-12-23 NOTE — Progress Notes (Signed)
Subjective:   Jesse Duncan is a 29 y.o. male presenting on 12/23/2014 with Hypertension  Here today to f/u.   Needs updated DOT soon though.   He notes due to cost, ran out of BP medication 4 months ago. He quit tobacco 6 mo ago.   He never has been able to get sleep study from last visit.   He feels fine other than not getting much sleep.   Works 2 jobs. Is a Designer, industrial/product from 7am - 5am.   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  No other aggravating or relieving factors.  No other complaint.  Review of Systems ROS as in subjective     Objective: BP 178/130 mmHg  Pulse 75  Ht 6' 1.5" (1.867 m)  Wt 346 lb (156.945 kg)  BMI 45.03 kg/m2  General appearance: alert, no distress, WD/WN, morbidly obese AA male Nek: supple, no lymphadenopathy, no thyromegaly, no masses, no bruits Heart: RRR, normal S1, S2, no murmurs Lungs: CTA bilaterally, no wheezes, rhonchi, or rales Abdomen: +bs, soft, non tender, non distended, no masses, no hepatomegaly, no splenomegaly Pulses: 2+ symmetric, upper and lower extremities, normal cap refill Ext: no edema Neuro: nonfocal, no papilledema      Assessment: Encounter Diagnoses  Name Primary?  . Essential hypertension Yes  . Morbid obesity, unspecified obesity type (HCC)   . Sleep disturbance   . Snoring   . Witnessed apneic spells   . Hyperlipidemia   . Former smoker      Plan: Discussed need for compliance, restarting BP medication, labs today, referral for sleep study.  discussed need to lose weight, to make health a priority and get BP and weight under control.   F/u pending labs and sleep study.  restart medication, advised he use a different pharmacy for cheaper pricing on generics.   Jesse Duncan was seen today for  hypertension.  Diagnoses and all orders for this visit:  Essential hypertension -     lisinopril-hydrochlorothiazide (PRINZIDE,ZESTORETIC) 20-12.5 MG tablet; Take 1 tablet by mouth daily. -     amLODipine (NORVASC) 10 MG tablet; Take 1 tablet (10 mg total) by mouth daily. -     Comprehensive metabolic panel -     TSH -     Lipid panel -     CBC -     Hemoglobin A1c -     POCT urinalysis dipstick -     Nocturnal polysomnography (NPSG); Future  Morbid obesity, unspecified obesity type (HCC) -     Comprehensive metabolic panel -     TSH -     Lipid panel -     CBC -     Hemoglobin A1c -     POCT urinalysis dipstick -     Nocturnal polysomnography (NPSG); Future  Sleep disturbance -     Comprehensive metabolic panel -     TSH -     Lipid panel -     CBC -     Hemoglobin A1c -     POCT urinalysis dipstick -     Nocturnal polysomnography (NPSG); Future  Snoring -     Comprehensive metabolic panel -     TSH -     Lipid panel -     CBC -  Hemoglobin A1c -     POCT urinalysis dipstick -     Nocturnal polysomnography (NPSG); Future  Witnessed apneic spells -     Comprehensive metabolic panel -     TSH -     Lipid panel -     CBC -     Hemoglobin A1c -     POCT urinalysis dipstick -     Nocturnal polysomnography (NPSG); Future  Hyperlipidemia -     Comprehensive metabolic panel -     TSH -     Lipid panel -     CBC -     Hemoglobin A1c -     POCT urinalysis dipstick -     Nocturnal polysomnography (NPSG); Future  Former smoker -     Comprehensive metabolic panel -     TSH -     Lipid panel -     CBC -     Hemoglobin A1c -     POCT urinalysis dipstick -     Nocturnal polysomnography (NPSG); Future    Return pending labs.

## 2014-12-23 NOTE — Addendum Note (Signed)
Addended by: Kieth BrightlyLAWSON, Curlee Bogan M on: 12/23/2014 12:19 PM   Modules accepted: Kipp BroodSmartSet

## 2014-12-24 ENCOUNTER — Other Ambulatory Visit: Payer: Self-pay | Admitting: Medical

## 2014-12-24 LAB — TSH: TSH: 3.417 u[IU]/mL (ref 0.350–4.500)

## 2014-12-24 MED ORDER — PRAVASTATIN SODIUM 20 MG PO TABS
20.0000 mg | ORAL_TABLET | Freq: Every day | ORAL | Status: DC
Start: 2014-12-24 — End: 2015-07-23

## 2014-12-25 ENCOUNTER — Telehealth: Payer: Self-pay

## 2014-12-25 NOTE — Telephone Encounter (Signed)
Called and wanted me to go back over his labs with him he forgot what i said. I went back over with him and said he understands

## 2015-02-12 ENCOUNTER — Telehealth (HOSPITAL_COMMUNITY): Payer: Self-pay | Admitting: LICENSED CLINICAL SOCIAL WORKER

## 2015-07-22 ENCOUNTER — Encounter (HOSPITAL_COMMUNITY): Payer: Self-pay | Admitting: Emergency Medicine

## 2015-07-22 ENCOUNTER — Emergency Department (HOSPITAL_COMMUNITY)
Admission: EM | Admit: 2015-07-22 | Discharge: 2015-07-22 | Disposition: A | Discharge status: Home / Self Care | Payer: 59 | Attending: Emergency Medicine | Admitting: Emergency Medicine

## 2015-07-22 DIAGNOSIS — Z79899 Other long term (current) drug therapy: Secondary | ICD-10-CM | POA: Insufficient documentation

## 2015-07-22 DIAGNOSIS — I4891 Unspecified atrial fibrillation: Secondary | ICD-10-CM | POA: Insufficient documentation

## 2015-07-22 DIAGNOSIS — Z87891 Personal history of nicotine dependence: Secondary | ICD-10-CM | POA: Insufficient documentation

## 2015-07-22 DIAGNOSIS — I10 Essential (primary) hypertension: Secondary | ICD-10-CM | POA: Insufficient documentation

## 2015-07-22 DIAGNOSIS — R002 Palpitations: Secondary | ICD-10-CM

## 2015-07-22 LAB — BASIC METABOLIC PANEL
Anion gap: 6 (ref 5–15)
BUN: 9 mg/dL (ref 6–20)
CALCIUM: 9.5 mg/dL (ref 8.9–10.3)
CO2: 27 mmol/L (ref 22–32)
CREATININE: 1.14 mg/dL (ref 0.61–1.24)
Chloride: 105 mmol/L (ref 101–111)
GFR calc non Af Amer: >60 mL/min (ref >60)
GLUCOSE: 115 mg/dL — AB (ref 65–99)
Potassium: 3.9 mmol/L (ref 3.5–5.1)
Sodium: 138 mmol/L (ref 135–145)

## 2015-07-22 LAB — CBC
HCT: 43.1 % (ref 39.0–52.0)
Hemoglobin: 14.1 g/dL (ref 13.0–17.0)
MCH: 27.3 pg (ref 26.0–34.0)
MCHC: 32.7 g/dL (ref 30.0–36.0)
MCV: 83.5 fL (ref 78.0–100.0)
PLATELETS: 305 10*3/uL (ref 150–400)
RBC: 5.16 MIL/uL (ref 4.22–5.81)
RDW: 12.7 % (ref 11.5–15.5)
WBC: 6.3 10*3/uL (ref 4.0–10.5)

## 2015-07-22 LAB — I-STAT TROPONIN, ED: TROPONIN I, POC: 0.03 ng/mL (ref 0.00–0.08)

## 2015-07-22 MED ORDER — DILTIAZEM HCL 25 MG/5ML IV SOLN: 20.0000 mg | Freq: Once | INTRAVENOUS | Status: DC

## 2015-07-22 MED ORDER — PROPOFOL 10 MG/ML IV BOLUS
INTRAVENOUS | Status: AC
  Filled 2015-07-22: qty 20

## 2015-07-22 MED ORDER — PROPOFOL 10 MG/ML IV BOLUS
0.5000 mg/kg | Freq: Once | INTRAVENOUS | Status: AC
  Administered 2015-07-22: 100 mg via INTRAVENOUS

## 2015-07-22 MED ORDER — FENTANYL CITRATE (PF) 100 MCG/2ML IJ SOLN
INTRAMUSCULAR | Status: AC
  Filled 2015-07-22: qty 2

## 2015-07-22 MED ORDER — FENTANYL CITRATE (PF) 100 MCG/2ML IJ SOLN
50.0000 ug | INTRAMUSCULAR | Status: DC | PRN
  Administered 2015-07-22: 50 ug via INTRAVENOUS

## 2015-07-22 NOTE — ED Notes (Signed)
Pt here from work with c/o afib , pt had some chest pain when his  Rate was high pt received 20 ivp Cardizem pt has no chest pain afib on arrival

## 2015-07-22 NOTE — ED Notes (Signed)
Wasted 50mcg fently in sick Jesse Blancmelissa S RN witnessed

## 2015-07-22 NOTE — ED Provider Notes (Addendum)
CSN: [id number]     Arrival date & time 07/22/15  1017 History   First MD Initiated Contact with Patient 07/22/15 1045     Chief Complaint  Patient presents with  . Atrial Fibrillation     (Consider location/radiation/quality/duration/timing/severity/associated sxs/prior Treatment) HPI Comments: 30 year-old male history of high blood pressure obesity lipids tobacco abuse presents with palpitations that started at 9:00 this morning. No history of similar. Patient has had difficulty with sleeping for some time now sometimes only sleeping 2 hours a night. Patient worked 2 jobs. Patient denies illegal drugs. No family history of sudden death. No known cardiac history.  Patient is a 30 y.o. male presenting with atrial fibrillation. The history is provided by the patient.  Atrial Fibrillation Pertinent negatives include no chest pain, no abdominal pain, no headaches and no shortness of breath.    Past Medical History  Diagnosis Date  . Hypertension   . Obesity   . Dyslipidemia   . Smoker    History reviewed. No pertinent past surgical history. Family History  Problem Relation Age of Onset  . Hypertension Mother   . Stroke Maternal Grandmother   . Cancer Maternal Grandfather   . Heart disease Neg Hx    Social History  Substance Use Topics  . Smoking status: Former Smoker -- 6 years    Types: Cigars    Quit date: 07/05/2014  . Smokeless tobacco: Never Used  . Alcohol Use: No    Review of Systems  Constitutional: Negative for fever and chills.  HENT: Negative for congestion.   Eyes: Negative for visual disturbance.  Respiratory: Negative for shortness of breath.   Cardiovascular: Positive for palpitations. Negative for chest pain.  Gastrointestinal: Negative for vomiting and abdominal pain.  Genitourinary: Negative for dysuria and flank pain.  Musculoskeletal: Negative for back pain, neck pain and neck stiffness.  Skin: Negative for rash.  Neurological: Negative for  light-headedness and headaches.      Allergies  Review of patient's allergies indicates no known allergies.  Home Medications   Prior to Admission medications   Medication Sig Start Date End Date Taking? Authorizing Provider  amLODipine (NORVASC) 10 MG tablet Take 1 tablet (10 mg total) by mouth daily. Patient not taking: Reported on 07/22/2015 12/23/14   Kermit Balo Tysinger, PA-C  lisinopril-hydrochlorothiazide (PRINZIDE,ZESTORETIC) 20-12.5 MG tablet Take 1 tablet by mouth daily. Patient not taking: Reported on 07/22/2015 12/23/14   Kermit Balo Tysinger, PA-C  pravastatin (PRAVACHOL) 20 MG tablet Take 1 tablet (20 mg total) by mouth daily. Patient not taking: Reported on 07/22/2015 12/24/14   Kermit Balo Tysinger, PA-C  testosterone (TESTIM) 50 MG/5GM (1%) GEL PLACE DAILY ON SKIN Patient not taking: Reported on 12/23/2014 07/12/13   Ronnald Nian, MD   BP 186/129 mmHg  Pulse 87  Temp(Src) 97.7 F (36.5 C) (Oral)  Resp 17  Ht 6\' 1"  (1.854 m)  Wt 345 lb (156.491 kg)  BMI 45.53 kg/m2  SpO2 100% Physical Exam  Constitutional: He is oriented to person, place, and time. He appears well-developed and well-nourished.  HENT:  Head: Normocephalic and atraumatic.  Eyes: Conjunctivae are normal. Right eye exhibits no discharge. Left eye exhibits no discharge.  Neck: Normal range of motion. Neck supple. No tracheal deviation present.  Cardiovascular: An irregularly irregular rhythm present. Tachycardia present.   Pulmonary/Chest: Effort normal and breath sounds normal.  Abdominal: Soft. He exhibits no distension. There is no tenderness. There is no guarding.  Musculoskeletal: He exhibits no edema.  Neurological: He is alert and oriented to person, place, and time.  Skin: Skin is warm. No rash noted.  Psychiatric: He has a normal mood and affect.  Nursing note and vitals reviewed.   ED Course  .Cardioversion Date/Time: 07/22/2015 11:59 AM Performed by: Blane Ohara Authorized by: Blane Ohara Consent: Verbal consent obtained. Risks and benefits: risks, benefits and alternatives were discussed Consent given by: patient Patient understanding: patient states understanding of the procedure being performed Patient consent: the patient's understanding of the procedure matches consent given Patient identity confirmed: verbally with patient and arm band Time out: Immediately prior to procedure a "time out" was called to verify the correct patient, procedure, equipment, support staff and site/side marked as required. Patient sedated: yes Sedatives: propofol Analgesia: fentanyl Cardioversion basis: emergent Pre-procedure rhythm: ventricular tachycardia Electrodes: pads Electrodes placed: anterior-posterior Number of attempts: 1 Attempt 1 mode: synchronous Attempt 1 waveform: biphasic Attempt 1 shock (in Joules): 200 Attempt 1 outcome: conversion to normal sinus rhythm Post-procedure rhythm: normal sinus rhythm Complications: no complications Patient tolerance: Patient tolerated the procedure well with no immediate complications   (including critical care time) Procedural sedation Performed by: Enid Skeens Consent: Verbal consent obtained. Risks and benefits: risks, benefits and alternatives were discussed Required items: required blood products, implants, devices, and special equipment available Patient identity confirmed: arm band and provided demographic data Time out: Immediately prior to procedure a "time out" was called to verify the correct patient, procedure, equipment, support staff and site  Sedation type: moderate (conscious) sedation NPO time confirmed, risks discussed  Sedatives: propofol  Physician Time at Bedside: 15 min    Vitals: Vital signs were monitored during sedation. Cardiac Monitor, pulse oximeter Patient tolerance: Patient tolerated the procedure well with no immediate complications. Comments: Pt with uneventful recovered. Returned to  pre-procedural sedation baseline    Labs Review Labs Reviewed  BASIC METABOLIC PANEL - Abnormal; Notable for the following:    Glucose, Bld 115 (*)    All other components within normal limits  CBC  I-STAT TROPOININ, ED    Imaging Review No results found. I have personally reviewed and evaluated these images and lab results as part of my medical decision-making.   EKG Interpretation   Date/Time:  Tuesday July 22 2015 10:26:17 EDT Ventricular Rate:  94 PR Interval:    QRS Duration: 93 QT Interval:  356 QTC Calculation: 446 R Axis:   40 Text Interpretation:  Atrial fibrillation Confirmed by Wynn Alldredge MD, Ivin Booty  [phone]) on 07/22/2015 10:48:24 AM Also confirmed by Jodi Mourning MD, Innocence Schlotzhauer  [phone]), editor Whitney Post, Cala Bradford [phone])  on 07/22/2015 11:17:30 AM     Repeat EKG sinus rhythm heart rate 81, no acute ST changes, normal QT MDM   Final diagnoses:  Atrial fibrillation, unspecified type (HCC)  Heart palpitations  Essential hypertension   Patient presents with new onset atrial fibrillation. Mild palpitations. Currently no chest pain or short of breath. Patient does have a history of uncontrolled blood pressure. Patient received small boluses of Cardizem on route. Pt non compliant with bp meds.    CHADSVASC 1.  Patient will require aspirin and follow-up with cardiology.  Discuss risks and benefits of cardioversion and sedation. Patient understands risks and benefits and wished to proceed with cardioversion. Patient is within 48 hour onset time. Patient is low risk for any complications.  Results and differential diagnosis were discussed with the patient/parent/guardian. Xrays were independently reviewed by myself.  Close follow up outpatient was discussed, comfortable with the plan.  Medications  fentaNYL (SUBLIMAZE) injection 50 mcg (50 mcg Intravenous Given 07/22/15 1238)  diltiazem (CARDIZEM) injection 20 mg (not administered)  propofol (DIPRIVAN) 10 mg/mL bolus/IV push (not  administered)  fentaNYL (SUBLIMAZE) 100 MCG/2ML injection (not administered)  propofol (DIPRIVAN) 10 mg/mL bolus/IV push 78.3 mg (0 mg/kg  156.5 kg Intravenous Stopped 07/22/15 1250)    Filed Vitals:   07/22/15 1130 07/22/15 1151 07/22/15 1230 07/22/15 1245  BP: 182/86 195/128 153/131 186/129  Pulse: 32 92 45 87  Temp:      TempSrc:      Resp: 34 27 23 17   Height:      Weight:      SpO2: 99% 98% 100% 100%    Final diagnoses:  Atrial fibrillation, unspecified type (HCC)  Heart palpitations  Essential hypertension         Blane OharaJoshua Bahar Shelden, MD 07/22/15 1321  Blane OharaJoshua Tiaja Hagan, MD 07/22/15 1322

## 2015-07-22 NOTE — ED Notes (Signed)
Attempted to draw labs. Unsuccessful attempt.

## 2015-07-22 NOTE — ED Notes (Signed)
 100mg  diprivan  Wasted down sink witnessed by Corliss SkainsJessica  E RN

## 2015-07-22 NOTE — Discharge Instructions (Signed)
Take aspirin 325 mg daily and discuss further with heart doctor.  If you develop heart racing, pass out, persistent chest pain or other concerns please return to the ER.  If you were given medicines take as directed.  If you are on coumadin or contraceptives realize their levels and effectiveness is altered by many different medicines.  If you have any reaction (rash, tongues swelling, other) to the medicines stop taking and see a physician.    If your blood pressure was elevated in the ER make sure you follow up for management with a primary doctor or return for chest pain, shortness of breath or stroke symptoms.  Please follow up as directed and return to the ER or see a physician for new or worsening symptoms.  Thank you. Filed Vitals:   07/22/15 1100 07/22/15 1115 07/22/15 1151 07/22/15 1245  BP: 144/87 137/106 195/128 186/129  Pulse: 55 35 92 87  Temp:      TempSrc:      Resp: Height:      Weight:      SpO2: 98% 96% 98% 100%    Atrial Fibrillation Atrial fibrillation is a type of heartbeat that is irregular or fast (rapid). If you have this condition, your heart keeps quivering in a weird (chaotic) way. This condition can make it so your heart cannot pump blood normally. Having this condition gives a person more risk for stroke, heart failure, and other heart problems. There are different types of atrial fibrillation. Talk with your doctor to learn about the type that you have. HOME CARE  Take over-the-counter and prescription medicines only as told by your doctor.  If your doctor prescribed a blood-thinning medicine, take it exactly as told. Taking too much of it can cause bleeding. If you do not take enough of it, you will not have the protection that you need against stroke and other problems.  Do not use any tobacco products. These include cigarettes, chewing tobacco, and e-cigarettes. If you need help quitting, ask your doctor.  If you have apnea (obstructive  sleep apnea), manage it as told by your doctor.  Do not drink alcohol.  Do not drink beverages that have caffeine. These include coffee, soda, and tea.  Maintain a healthy weight. Do not use diet pills unless your doctor says they are safe for you. Diet pills may make heart problems worse.  Follow diet instructions as told by your doctor.  Exercise regularly as told by your doctor.  Keep all follow-up visits as told by your doctor. This is important. GET HELP IF:  You notice a change in the speed, rhythm, or strength of your heartbeat.  You are taking a blood-thinning medicine and you notice more bruising.  You get tired more easily when you move or exercise. GET HELP RIGHT AWAY IF:  You have pain in your chest or your belly (abdomen).  You have sweating or weakness.  You feel sick to your stomach (nauseous).  You notice blood in your throw up (vomit), poop (stool), or pee (urine).  You are short of breath.  You suddenly have swollen feet and ankles.  You feel dizzy.  Your suddenly get weak or numb in your face, arms, or legs, especially if it happens on one side of your body.  You have trouble talking, trouble understanding, or both.  Your face or your eyelid droops on one side. These symptoms may be an emergency. Do not wait to see if the  symptoms will go away. Get medical help right away. Call your local emergency services (911 in the [place] Do not drive yourself to the hospital.   This information is not intended to replace advice given to you by your health care provider. Make sure you discuss any questions you have with your health care provider.   Document Released: 09/30/2007 Document Revised: 09/11/2014 Document Reviewed: 04/17/2014 Elsevier Interactive Patient Education [employer]2016 [employer] Inc.

## 2015-07-23 ENCOUNTER — Encounter: Payer: Self-pay | Admitting: Medical

## 2015-07-23 ENCOUNTER — Ambulatory Visit (INDEPENDENT_AMBULATORY_CARE_PROVIDER_SITE_OTHER): Payer: 59 | Admitting: Medical

## 2015-07-23 VITALS — BP 186/132 | HR 86 | Wt 354.0 lb

## 2015-07-23 DIAGNOSIS — I160 Hypertensive urgency: Secondary | ICD-10-CM

## 2015-07-23 DIAGNOSIS — Z563 Stressful work schedule: Secondary | ICD-10-CM

## 2015-07-23 DIAGNOSIS — R0681 Apnea, not elsewhere classified: Secondary | ICD-10-CM | POA: Diagnosis not present

## 2015-07-23 DIAGNOSIS — I4891 Unspecified atrial fibrillation: Secondary | ICD-10-CM

## 2015-07-23 DIAGNOSIS — E785 Hyperlipidemia, unspecified: Secondary | ICD-10-CM | POA: Diagnosis not present

## 2015-07-23 DIAGNOSIS — Z91199 Patient's noncompliance with other medical treatment and regimen due to unspecified reason: Secondary | ICD-10-CM

## 2015-07-23 DIAGNOSIS — Z9119 Patient's noncompliance with other medical treatment and regimen: Secondary | ICD-10-CM

## 2015-07-23 DIAGNOSIS — R0683 Snoring: Secondary | ICD-10-CM | POA: Diagnosis not present

## 2015-07-23 DIAGNOSIS — Z720 Tobacco use: Secondary | ICD-10-CM

## 2015-07-23 DIAGNOSIS — E291 Testicular hypofunction: Secondary | ICD-10-CM

## 2015-07-23 DIAGNOSIS — F17200 Nicotine dependence, unspecified, uncomplicated: Secondary | ICD-10-CM

## 2015-07-23 MED ORDER — ASPIRIN EC 325 MG PO TBEC: 325.0000 mg | DELAYED_RELEASE_TABLET | Freq: Every day | ORAL | Status: DC

## 2015-07-23 MED ORDER — LOSARTAN POTASSIUM-HCTZ 100-12.5 MG PO TABS: 1.0000 | ORAL_TABLET | Freq: Every day | ORAL | Status: DC

## 2015-07-23 NOTE — Patient Instructions (Signed)
Encounter Diagnoses  Name Primary?  . Hypertensive urgency Yes  . Atrial fibrillation, unspecified type (HCC)   . Smoker   . Witnessed apneic spells   . Snoring   . Morbid obesity, unspecified obesity type (HCC)   . Noncompliance   . Hypogonadism in male   . Stressful work schedule   . Hyperlipidemia    Recommendations:  Today's visit was hopefully a chance to help you realize that some things need to change to get you to a healthier place  Given your current blood pressure, tobacco use, work schedule, and health factors, you are at high risk of stroke and heart attack!  Today is the day to change this!  Begin Losartan HCT blood pressure medication once daily  Begin Aspirin 325mg  daily at bedtime  I sent these to the pharmacy.  Call back if too expensive  Follow up with the Afib clinic as scheduled  STOP SMOKING TODAY!!!  We will refer you for a sleep study  I hope you will take our advice and take the medications, finding a way to adjust your schedule to get exercise, to work with your family to live within a budget, to consider less work hours, making sleep a priority, making health eating plan a priority  We are here to help you, but only you can make the changes that need to be made  I want to see you back in 3-4 weeks

## 2015-07-23 NOTE — Progress Notes (Signed)
Subjective: Chief Complaint  Patient presents with  . Follow-up    hospital f/up. chest pain, heart beating fast, and left arm went numb. was told he has A-Fib.    Here with wife. His history is significant for uncontrolled hypertension, noncompliance with medication or treatment in general, obesity, hyperlipidemia, tobacco use.    This is an ED f/u visit.  Last night while at work started getting palpitations, SOB, had elevated BP, and EMS was called.  Went to the ED, diagnosed with Afib, and since symptom onset was <48 hours, cardioversion was done in the ED getting him back to normal rhythm.   He was advised to f/u with PCP within 2 days, Afib clinic on Monday in 6 days, and has cariology appt as well.  He has no hx/o afib.  He is a smoker, sometimes drinks alcohol but denies street drug use.      His wife notes that he works up to 20 hours daily, sleeps very little, is a workaholic, and doesn't take care of himself. He reports always needing more money to afford thinks, fun things, nice things, travel.  He works 2 jobs, enjoys his work. 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  Wife notes this is not new, and has been working this type of schedule (3rd shift) for a while.   He is not exercising.   She notes witnessed apneic events, snoring.    While sedated for the cardioversion, she notes that the doctors in that short period of time commented on sleep apnea, hypoxia in the 80% range.    He notes that he never followed up or started back on BP medication in 12/2014 as he wanted to get his BP under control on his own.    Today he denies chest pain, SOB, dyspnea, fatigue.     Past Medical History  Diagnosis Date  . Hypertension   . Obesity   . Dyslipidemia   . Smoker    Family History  Problem Relation Age of Onset  . Hypertension Mother   . Stroke Maternal Grandmother   . Cancer Maternal Grandfather   . Heart disease Neg Hx     ROS as in subjective   Objective; BP 186/132 mmHg  Pulse 86   Wt 354 lb (160.573 kg)  General appearance: alert, no distress, WD/WN, obese AA male Oral cavity: MMM, no lesions Neck: supple, no lymphadenopathy, no thyromegaly, no masses, no bruits Heart: RRR, normal S1, S2, no murmurs Lungs: CTA bilaterally, no wheezes, rhonchi, or rales Abdomen: +bs, soft, non tender, non distended, no masses, no hepatomegaly, no splenomegaly Pulses: 1+ symmetric, upper and lower extremities, normal cap refill No obvious edema Neuro: CN2-12 intact, nonfocal exam, DTRs 2+, no papilledema    Assessment: Encounter Diagnoses  Name Primary?  . Hypertensive urgency Yes  . Atrial fibrillation, unspecified type (HCC)   . Smoker   . Witnessed apneic spells   . Snoring   . Morbid obesity, unspecified obesity type (HCC)   . Noncompliance   . Hypogonadism in male   . Stressful work schedule   . Hyperlipidemia      Plan: I reviewed the labs and ED report from yesterday's ED visit.   I had an honest and frank talk with him about his current health, that he is high risk for stroke/MI, that he has to decide if he is going to do something about his present state of health. i encouraged him to work on this for his child's sake,  and to be around to enjoyr the things he wants to do.  I called Rudi Coco, NP at the Afib clinic to discuss his case.   He has f/u there in the next few days.  He is not in Afib today.   He is agreeable to starting back on BP medication, Aspirin, and I strongly advised he decide to quit tobacco today!  Advised he and wife take the next 2 days to discuss their budge, his work schedule, their priorities, and helping him come up with a better plan for exercise, balance in life in terms of work, home, personal responsibility.     referral for sleep study.  Recommendations:  Today's visit was hopefully a chance to help you realize that some things need to change to get you to a healthier place  Given your current blood pressure, tobacco use,  work schedule, and health factors, you are at high risk of stroke and heart attack!  Today is the day to change this!  Begin Losartan HCT blood pressure medication once daily  Begin Aspirin  daily at bedtime  I sent these to the pharmacy.  Call back if too expensive  Follow up with the Afib clinic as scheduled  STOP SMOKING TODAY!!!  We will refer you for a sleep study  I hope you will take our advice and take the medications, finding a way to adjust your schedule to get exercise, to work with your family to live within a budget, to consider less work hours, making sleep a priority, making health eating plan a priority  We are here to help you, but only you can make the changes that need to be made  I want to see you back in 3-4 weeks  Council was seen today for follow-up.  Diagnoses and all orders for this visit:  Hypertensive urgency  Atrial fibrillation, unspecified type (HCC)  Smoker  Witnessed apneic spells -     Split night study  Snoring -     Split night study  Morbid obesity, unspecified obesity type (HCC) -     Split night study  Noncompliance  Hypogonadism in male  Stressful work schedule  Hyperlipidemia  Other orders -     losartan-hydrochlorothiazide (HYZAAR) 100-12.5 MG tablet; Take 1 tablet by mouth daily. -     aspirin EC 325 MG tablet; Take 1 tablet (325 mg total) by mouth daily.   F/u 3-4 wk here, f/u with Afib clinic Monday as scheduled.

## 2015-07-28 ENCOUNTER — Telehealth (HOSPITAL_COMMUNITY): Payer: Self-pay | Admitting: Nurse Practitioner

## 2015-07-28 ENCOUNTER — Encounter (HOSPITAL_COMMUNITY): Payer: Self-pay | Admitting: Nurse Practitioner

## 2015-07-28 ENCOUNTER — Ambulatory Visit (HOSPITAL_COMMUNITY)
Admit: 2015-07-28 | Discharge: 2015-07-28 | Disposition: A | Discharge status: Home / Self Care | Payer: 59 | Source: Ambulatory Visit | Attending: Nurse Practitioner | Admitting: Nurse Practitioner

## 2015-07-28 VITALS — BP 182/118 | HR 90 | Ht 73.0 in | Wt 359.2 lb

## 2015-07-28 DIAGNOSIS — Z7901 Long term (current) use of anticoagulants: Secondary | ICD-10-CM | POA: Insufficient documentation

## 2015-07-28 DIAGNOSIS — Z79899 Other long term (current) drug therapy: Secondary | ICD-10-CM | POA: Insufficient documentation

## 2015-07-28 DIAGNOSIS — I10 Essential (primary) hypertension: Secondary | ICD-10-CM | POA: Diagnosis not present

## 2015-07-28 DIAGNOSIS — Z6841 Body Mass Index (BMI) 40.0 and over, adult: Secondary | ICD-10-CM | POA: Diagnosis not present

## 2015-07-28 DIAGNOSIS — I480 Paroxysmal atrial fibrillation: Secondary | ICD-10-CM | POA: Diagnosis present

## 2015-07-28 DIAGNOSIS — Z87891 Personal history of nicotine dependence: Secondary | ICD-10-CM | POA: Diagnosis not present

## 2015-07-28 DIAGNOSIS — E785 Hyperlipidemia, unspecified: Secondary | ICD-10-CM | POA: Diagnosis not present

## 2015-07-28 DIAGNOSIS — R0683 Snoring: Secondary | ICD-10-CM | POA: Insufficient documentation

## 2015-07-28 DIAGNOSIS — Z7982 Long term (current) use of aspirin: Secondary | ICD-10-CM | POA: Diagnosis not present

## 2015-07-28 DIAGNOSIS — Z8249 Family history of ischemic heart disease and other diseases of the circulatory system: Secondary | ICD-10-CM | POA: Diagnosis not present

## 2015-07-28 MED ORDER — RIVAROXABAN 20 MG PO TABS: 20.0000 mg | ORAL_TABLET | Freq: Every day | ORAL | 0 refills | Status: DC

## 2015-07-28 MED ORDER — DILTIAZEM HCL ER COATED BEADS 120 MG PO CP24: 120.0000 mg | ORAL_CAPSULE | Freq: Every day | ORAL | 6 refills | Status: DC

## 2015-07-28 NOTE — Progress Notes (Signed)
Patient ID: Linux Eicher, male   DOB: 06-30-1985, 30 y.o.   MRN: [id number]     Primary Care Physician: Carollee Herter, MD Referring Physician: ER F/U   Malcomb Roelfs is a 30 y.o. male with a h/o morbid obesity, poorly controlled hypertension,  h/o of non compliance, probable sleep apnea, tobacco use, f/u from ER with new onset of afib. Pt was cardioverted with a chadsvasc score of 1 for HTN, d/c on ASA. Mention in the ER notes that he snored and desaturated with anesthesia with DCCV.  He did f/u with PCP last week and was started on antihypertensives, however BP still not well controlled but he just took BP pill hour ago.  He does work third shift and a first shift job. He states that he has had this schedule for years. He sleeps poorly and his wife confirms that he does snore.l His weight at graduation from high school was around 240 lbs. He does drink energy drinks, positive tobacco abuse. He has a chadsvasc score of 1 and should be on blood thinner x 30 days post cardioversion with a chadsvasc score of 1. No reoccurrence of afib.  Today, he denies symptoms of palpitations, chest pain, shortness of breath, orthopnea, PND, lower extremity edema, dizziness, presyncope, syncope, or neurologic sequela. The patient is tolerating medications without difficulties and is otherwise without complaint today.   Past Medical History:  Diagnosis Date  . Dyslipidemia   . Hypertension   . Obesity   . Smoker    No past surgical history on file.  Current Outpatient Prescriptions  Medication Sig Dispense Refill  . aspirin EC 325 MG tablet Take 1 tablet (325 mg total) by mouth daily. 90 tablet 3  . losartan-hydrochlorothiazide (HYZAAR) 100-12.5 MG tablet Take 1 tablet by mouth daily. 90 tablet 1  . diltiazem (CARDIZEM CD) 120 MG 24 hr capsule Take 1 capsule (120 mg total) by mouth daily. 30 capsule 6  . rivaroxaban (XARELTO) 20 MG TABS tablet Take 1 tablet (20 mg total) by mouth daily with  supper. 30 tablet 0   No current facility-administered medications for this encounter.     No Known Allergies  Social History   Social History  . Marital status: Single    Spouse name: N/A  . Number of children: N/A  . Years of education: N/A   Occupational History  . Not on file.   Social History Main Topics  . Smoking status: Former Smoker    Years: 6.00    Types: Cigars    Quit date: 07/14/2015  . Smokeless tobacco: Never Used  . Alcohol use No  . Drug use: No  . Sexual activity: Not Currently   Other Topics Concern  . 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       Family History  Problem Relation Age of Onset  . Hypertension Mother   . Stroke Maternal Grandmother   . Cancer Maternal Grandfather   . Heart disease Neg Hx     ROS- All systems are reviewed and negative except as per the HPI above  Physical Exam: Vitals:   07/28/15 0918  BP: (!) 182/118  Pulse: 90  Weight: (!) 359 lb 3.2 oz (162.9 kg)  Height: 6\' 1"  (1.854 m)    GEN- The patient is well appearing, alert and oriented x 3 today.   Head- normocephalic, atraumatic Eyes-  Sclera clear, conjunctiva pink Ears- hearing intact  Oropharynx- clear Neck- supple, no JVP Lymph- no cervical lymphadenopathy Lungs- Clear to ausculation bilaterally, normal work of breathing Heart- Regular rate and rhythm, no murmurs, rubs or gallops, PMI not laterally displaced GI- soft, NT, ND, + BS Extremities- no clubbing, cyanosis, or edema MS- no significant deformity or atrophy Skin- no rash or lesion Psych- euthymic mood, full affect Neuro- strength and sensation are intact  EKG-NSR at 90, pr int 134 ms, qrs int 96 ms, qtc 464 ms Epic records reviewed including labs, ER notes  Assessment and Plan: 1. PAF Will start on Cardizem 120 mg qd for additional BP management and to help maintain SR Will need to take 30 days of anticoagulant s/p cardioversion per  protocol Will stop asa and will start Xarelto 20 mg a day Explained to pt there are not any studies with use of xarelto with his weight that it will be effective, but he may not even get therapeutic with coumadin in just 30 days time and because of this and the difficulty with him working two jobs and getting INR's drawn, he will start xarelto. Free 30 day coupon given. Bleeding precautions discussed Denies bleeding tendency Restart asa after xarelto finished. Echo ordered No energy drinks  2. Risk factor modification Weight management discussed with pt and wife and weight class info  given to pt and strongly encouraged to attend Sleep study pending  Encouraged pt to sleep on side for now to diminish snoring Smoking cessation encouraged  Regular exercise encouraged  3. HTN Continue losartan /hctz 100/12.5 I have added cardizem 120 mg a day Decrease salt in the diet  F/u in the afib clinic in 2 weeks

## 2015-07-28 NOTE — Patient Instructions (Signed)
Your physician has recommended you make the following change in your medication:  1)Cardizem 120mg once a day . 

## 2015-08-01 NOTE — Telephone Encounter (Signed)
error 

## 2015-08-04 ENCOUNTER — Ambulatory Visit (HOSPITAL_COMMUNITY)
Admission: RE | Admit: 2015-08-04 | Discharge: 2015-08-04 | Disposition: A | Discharge status: Home / Self Care | Payer: 59 | Source: Ambulatory Visit | Attending: Nurse Practitioner | Admitting: Nurse Practitioner

## 2015-08-04 DIAGNOSIS — E669 Obesity, unspecified: Secondary | ICD-10-CM | POA: Diagnosis not present

## 2015-08-04 DIAGNOSIS — I480 Paroxysmal atrial fibrillation: Secondary | ICD-10-CM | POA: Diagnosis not present

## 2015-08-04 DIAGNOSIS — E785 Hyperlipidemia, unspecified: Secondary | ICD-10-CM | POA: Insufficient documentation

## 2015-08-04 DIAGNOSIS — Z720 Tobacco use: Secondary | ICD-10-CM | POA: Insufficient documentation

## 2015-08-04 DIAGNOSIS — I119 Hypertensive heart disease without heart failure: Secondary | ICD-10-CM | POA: Diagnosis not present

## 2015-08-04 DIAGNOSIS — Z6841 Body Mass Index (BMI) 40.0 and over, adult: Secondary | ICD-10-CM | POA: Diagnosis not present

## 2015-08-04 DIAGNOSIS — I4891 Unspecified atrial fibrillation: Secondary | ICD-10-CM | POA: Diagnosis present

## 2015-08-04 NOTE — Progress Notes (Signed)
  Echocardiogram 2D Echocardiogram has been performed.  Cathie Beams 08/04/2015, 4:18 PM

## 2015-08-15 ENCOUNTER — Ambulatory Visit (INDEPENDENT_AMBULATORY_CARE_PROVIDER_SITE_OTHER): Payer: 59 | Admitting: Medical

## 2015-08-15 ENCOUNTER — Encounter (HOSPITAL_COMMUNITY): Payer: Self-pay | Admitting: Nurse Practitioner

## 2015-08-15 ENCOUNTER — Encounter: Payer: Self-pay | Admitting: Medical

## 2015-08-15 ENCOUNTER — Ambulatory Visit (HOSPITAL_COMMUNITY)
Admission: RE | Admit: 2015-08-15 | Discharge: 2015-08-15 | Disposition: A | Discharge status: Home / Self Care | Payer: 59 | Source: Ambulatory Visit | Attending: Nurse Practitioner | Admitting: Nurse Practitioner

## 2015-08-15 VITALS — BP 198/128 | HR 77 | Ht 73.0 in | Wt 357.6 lb

## 2015-08-15 VITALS — BP 182/110 | HR 93 | Wt 365.0 lb

## 2015-08-15 DIAGNOSIS — I4891 Unspecified atrial fibrillation: Secondary | ICD-10-CM

## 2015-08-15 DIAGNOSIS — E785 Hyperlipidemia, unspecified: Secondary | ICD-10-CM | POA: Insufficient documentation

## 2015-08-15 DIAGNOSIS — R0683 Snoring: Secondary | ICD-10-CM | POA: Diagnosis not present

## 2015-08-15 DIAGNOSIS — Z87891 Personal history of nicotine dependence: Secondary | ICD-10-CM | POA: Diagnosis not present

## 2015-08-15 DIAGNOSIS — Z6841 Body Mass Index (BMI) 40.0 and over, adult: Secondary | ICD-10-CM | POA: Insufficient documentation

## 2015-08-15 DIAGNOSIS — I10 Essential (primary) hypertension: Secondary | ICD-10-CM

## 2015-08-15 DIAGNOSIS — I480 Paroxysmal atrial fibrillation: Secondary | ICD-10-CM

## 2015-08-15 DIAGNOSIS — R400 Somnolence: Secondary | ICD-10-CM

## 2015-08-15 DIAGNOSIS — Z79899 Other long term (current) drug therapy: Secondary | ICD-10-CM | POA: Insufficient documentation

## 2015-08-15 DIAGNOSIS — Z8249 Family history of ischemic heart disease and other diseases of the circulatory system: Secondary | ICD-10-CM | POA: Insufficient documentation

## 2015-08-15 DIAGNOSIS — R7301 Impaired fasting glucose: Secondary | ICD-10-CM | POA: Diagnosis not present

## 2015-08-15 DIAGNOSIS — G4710 Hypersomnia, unspecified: Secondary | ICD-10-CM | POA: Diagnosis not present

## 2015-08-15 DIAGNOSIS — E291 Testicular hypofunction: Secondary | ICD-10-CM | POA: Diagnosis not present

## 2015-08-15 HISTORY — DX: Unspecified atrial fibrillation: I48.91

## 2015-08-15 HISTORY — DX: Impaired fasting glucose: R73.01

## 2015-08-15 MED ORDER — LOSARTAN POTASSIUM-HCTZ 100-25 MG PO TABS: 1.0000 | ORAL_TABLET | Freq: Every day | ORAL | 3 refills | Status: DC

## 2015-08-15 MED ORDER — DILTIAZEM HCL ER COATED BEADS 240 MG PO CP24: 240.0000 mg | ORAL_CAPSULE | Freq: Every day | ORAL | 6 refills | Status: DC

## 2015-08-15 MED ORDER — LIRAGLUTIDE -WEIGHT MANAGEMENT 18 MG/3ML ~~LOC~~ SOPN: 3.0000 mg | PEN_INJECTOR | Freq: Every day | SUBCUTANEOUS | 3 refills | Status: DC

## 2015-08-15 NOTE — Progress Notes (Signed)
Subjective: Chief Complaint  Patient presents with  . Follow-up    3 week follow up bp is still running high. no other problems or concerns   Here with wife. His history is significant for uncontrolled hypertension, noncompliance with medication or treatment in general, obesity, hyperlipidemia, tobacco use.  At his recent visit we started him on BP medication and he has f/u with Afib clinic then and today.  His Cardizem dose was increased today by Afib clinic.     Since last visit here for uncontrolled hypertension, he has cut back on work hours, is eating healthier, exercising some.   He is compliant with medication.  He requests lab check for low testosterone since it has been low prior and he has symptoms including fatigue, decreased sex drive, decreased energy.  He was on testim prior but didn't like the odor or method of treatment.   He is here for BP f/u.  He has sleep study scheduled.     He requests something to help with weight loss.  His wife takes phentermine currently.    From last visit, he was here for emergency dept f/u.   Last visit from ED he had started getting palpitations, SOB, had elevated BP, and EMS was called.  Went to the ED, diagnosed with Afib, and since symptom onset was <48 hours, cardioversion was done in the ED getting him back to normal rhythm.   he has no hx/o afib.  He is a smoker, sometimes drinks alcohol but denies street drug use.    Last visit his wife noted that he was working up to 20 hours daily, sleeps very little, is a workaholic, and doesn't take care of himself. He reports always needing more money to afford thinks, fun things, nice things, travel.  He works 2 jobs, enjoys his work. 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  Wife notes this is not new, and has been working this type of schedule (3rd shift) for a while.   She notes witnessed apneic events, snoring.    While sedated for the cardioversion, she notes that the doctors in that short period of time commented on  sleep apnea, hypoxia in the 80% range.     Past Medical History:  Diagnosis Date  . Dyslipidemia   . Hypertension   . Obesity   . Smoker    Family History  Problem Relation Age of Onset  . Hypertension Mother   . Stroke Maternal Grandmother   . Cancer Maternal Grandfather   . Heart disease Neg Hx     ROS as in subjective   Objective; BP (!) 182/110   Pulse 93   Wt (!) 365 lb (165.6 kg)   BMI 48.16 kg/m    BP Readings from Last 3 Encounters:  08/15/15 (!) 182/110  08/15/15 (!) 198/128  07/28/15 (!) 182/118    General appearance: alert, no distress, WD/WN, obese AA male Oral cavity: MMM, no lesions Neck: supple, no lymphadenopathy, no thyromegaly, no masses, no bruits Heart: RRR, normal S1, S2, no murmurs Lungs: CTA bilaterally, no wheezes, rhonchi, or rales Abdomen: +bs, soft, non tender, non distended, no masses, no hepatomegaly, no splenomegaly Pulses: 1+ symmetric, upper and lower extremities, normal cap refill No obvious edema Neuro: CN2-12 intact, nonfocal exam, DTRs 2+, no papilledema     Assessment: Encounter Diagnoses  Name Primary?  . Essential hypertension Yes  . Hypogonadism male   . Morbid obesity, unspecified obesity type (HCC)   . Atrial fibrillation, unspecified type (HCC)   .  Impaired fasting blood sugar   . Daytime somnolence      Plan: I reviewed today's Afib clinic notes.  His Cardizem was increased to 240mg  today.  I increased his Losartan HCT to 100/25mg  daily.  Will likely need to add beta blocker going forward. He was on bystolic in the past.   I reviewed recent labs in chart record.  Begin trial of saxenda for weigh loss, c/t exercise, healthy diet, limiting salt.   Go for sleep study as planned.   C/t efforts to stop tobacco and lose weight.    Jesse DeerChristopher was seen today for follow-up.  Diagnoses and all orders for this visit:  Essential hypertension -     Testosterone  Hypogonadism male -     Testosterone  Morbid  obesity, unspecified obesity type (HCC) -     Testosterone  Atrial fibrillation, unspecified type (HCC)  Impaired fasting blood sugar  Daytime somnolence  Other orders -     losartan-hydrochlorothiazide (HYZAAR) 100-25 MG tablet; Take 1 tablet by mouth daily. -     Liraglutide -Weight Management (SAXENDA) 18 MG/3ML SOPN; Inject 3 mg into the skin daily.   F/u 4-6 wk

## 2015-08-15 NOTE — Progress Notes (Signed)
Patient ID: Jesse Duncan, male   DOB: May 12, 1985, 30 y.o.   MRN: [id number]     Primary Care Physician: Carollee HerterLALONDE,JOHN CHARLES, MD Referring Physician: ER F/U   Jesse HesselbachChristopher Nie is a 30 y.o. male with a h/o morbid obesity, poorly controlled hypertension,  h/o of non compliance, probable sleep apnea, tobacco use, f/u from ER with new onset of afib. Pt was cardioverted with a chadsvasc score of 1 for HTN, d/c on ASA. Mention in the ER notes that he snored and desaturated with anesthesia with DCCV.  He did f/u with PCP last week and was started on antihypertensives, however BP still not well controlled but he just took BP pill hour ago.  He does work third shift and a first shift job. He states that he has had this schedule for years. He sleeps poorly and his wife confirms that he does snore.l His weight at graduation from high school was around 240 lbs. He does drink energy drinks, positive tobacco abuse. He has a chadsvasc score of 1 and should be on blood thinner x 30 days post cardioversion with a chadsvasc score of 1. No reoccurrence of afib.  F/u 8/11- he states no awareness of afib, doing well on 30 day required blood thinner s/p cardioversion. BP still poorly controlled and does f/u with PCP this afternoon. BP rechecked 180/120.  Today, he denies symptoms of palpitations, chest pain, shortness of breath, orthopnea, PND, lower extremity edema, dizziness, presyncope, syncope, or neurologic sequela. The patient is tolerating medications without difficulties and is otherwise without complaint today.   Past Medical History:  Diagnosis Date  . Dyslipidemia   . Hypertension   . Obesity   . Smoker    No past surgical history on file.  Current Outpatient Prescriptions  Medication Sig Dispense Refill  . diltiazem (CARDIZEM CD) 240 MG 24 hr capsule Take 1 capsule (240 mg total) by mouth daily. 30 capsule 6  . losartan-hydrochlorothiazide (HYZAAR) 100-12.5 MG tablet Take 1 tablet by mouth  daily. 90 tablet 1  . rivaroxaban (XARELTO) 20 MG TABS tablet Take 1 tablet (20 mg total) by mouth daily with supper. 30 tablet 0  . aspirin EC 325 MG tablet Take 1 tablet (325 mg total) by mouth daily. (Patient not taking: Reported on 08/15/2015) 90 tablet 3   No current facility-administered medications for this encounter.     No Known Allergies  Social History   Social History  . Marital status: Single    Spouse name: N/A  . Number of children: N/A  . Years of education: N/A   Occupational History  . Not on file.   Social History Main Topics  . Smoking status: Former Smoker    Years: 6.00    Types: Cigars    Quit date: 07/14/2015  . Smokeless tobacco: Never Used  . Alcohol use No  . Drug use: No  . Sexual activity: Not Currently   Other Topics Concern  . 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       Family History  Problem Relation Age of Onset  . Hypertension Mother   . Stroke Maternal Grandmother   . Cancer Maternal Grandfather   . Heart disease Neg Hx     ROS- All systems are reviewed and negative except as per the HPI above  Physical Exam: Vitals:   08/15/15 1113  BP: (!) 198/128  Pulse: 77  Weight: (!) 357 lb  9.6 oz (162.2 kg)  Height:  (1.854 m)  BP rechecked 180/120  GEN- The patient is well appearing, alert and oriented x 3 today.   Head- normocephalic, atraumatic Eyes-  Sclera clear, conjunctiva pink Ears- hearing intact Oropharynx- clear Neck- supple, no JVP Lymph- no cervical lymphadenopathy Lungs- Clear to ausculation bilaterally, normal work of breathing Heart- Regular rate and rhythm, no murmurs, rubs or gallops, PMI not laterally displaced GI- soft, NT, ND, + BS Extremities- no clubbing, cyanosis, or edema MS- no significant deformity or atrophy Skin- no rash or lesion Psych- euthymic mood, full affect Neuro- strength and sensation are intact  EKG-NSR at 77, pr int 146  ms, qrs int 100 ms, qtc 454 ms Epic records reviewed including labs, ER notes Echo-Mildly dilated LV with mild LV hypertrophy. EF 40%, diffuse   hypokinesis. Prominent apical trabeculation in some views raise   concern for LV noncompaction. Normal RV size and systolic   function.   Assessment and Plan: 1. PAF Maintaining SR Continue  Cardizem  Will need to take 30 days of anticoagulant s/p cardioversion per protocol Will stop asa and will start Xarelto 20 mg a day  Explained to pt there are not any studies with use of xarelto with his weight that it will be effective, but he may not even get therapeutic with coumadin in just 30 days time and because of this and the difficulty with him working two jobs and getting INR's drawn, he will start xarelto. Free 30 day coupon given. No Bleeding issues Denies bleeding tendency Restart asa after xarelto finished. Echo reviewed  No energy drinks  2. Risk factor modification Weight management discussed with pt and wife and weight class info  given to pt and strongly encouraged to attend Sleep study pending  Encouraged pt to sleep on side for now to diminish snoring Smoking cessation encouraged  Regular exercise encouraged  3. HTN Continue losartan /hctz 100/12.5, may benefit from increase to 25 mg a day, sees PCP this pm for further management Increase cardizem to 240 mg a day Decrease salt in the diet  Have requested appointment to establish with cardiology afib clinic as needed

## 2015-08-15 NOTE — Patient Instructions (Signed)
Your physician has recommended you make the following change in your medication:  1)Increase cardizem to  once a day (take 2 -  tablets until finished with current prescription)   Scheduler will be in touch with you regarding general cardiology appointment

## 2015-08-16 LAB — TESTOSTERONE: Testosterone: 169 ng/dL — ABNORMAL LOW (ref 250–827)

## 2015-08-18 ENCOUNTER — Telehealth: Payer: Self-pay

## 2015-08-18 DIAGNOSIS — R0681 Apnea, not elsewhere classified: Secondary | ICD-10-CM

## 2015-08-18 DIAGNOSIS — R0683 Snoring: Secondary | ICD-10-CM

## 2015-08-18 NOTE — Telephone Encounter (Signed)
Referral for home sleep study since in office was not covered

## 2015-08-20 ENCOUNTER — Telehealth: Payer: Self-pay | Admitting: Medical

## 2015-08-27 NOTE — Telephone Encounter (Signed)
Saxenda denied due to plan exclusion.  Called and there are no covered alternatives.  Do you want to switch to lowest cost out of pocket weight loss medicine Phentermine or we can try appeal but unlikely will be approved due to plan exclusion?

## 2015-08-31 NOTE — Telephone Encounter (Signed)
No phentermine is not an option due to safety concerns.   It may be helpful to begin Wellbutrin which is used for cravings if he wants to try another option but this would be off label use.  Unfortunately his insurer doesn't cover weight loss medications.

## 2015-09-01 NOTE — Telephone Encounter (Signed)
Left message for pt

## 2015-09-01 NOTE — Telephone Encounter (Signed)
See msg

## 2015-09-15 ENCOUNTER — Other Ambulatory Visit: Payer: Self-pay | Admitting: Medical

## 2015-09-15 MED ORDER — BUPROPION HCL ER (XL) 150 MG PO TB24: 150.0000 mg | ORAL_TABLET | Freq: Every day | ORAL | 1 refills | Status: DC

## 2015-09-15 NOTE — Telephone Encounter (Signed)
Pt notified of med changes. appt scheduled for 10/20/15 @ 9:00AM.   Pt has sleep study appt on 09/23/15./RLB

## 2015-09-15 NOTE — Telephone Encounter (Signed)
I sent Wellbutrin.  F/u in 1 mo on medication, Wellbutrin.  Make sure we have set him up for home sleep study.

## 2015-09-15 NOTE — Progress Notes (Signed)
rx sent

## 2015-09-15 NOTE — Telephone Encounter (Signed)
Called pt & informed ins doesn't pay for weight loss meds and he does want to start the Wellbutrin.  Please send to pharmacy.  Also he has a weight loss follow up appt in a week, do you want him to still come in then or wait til he's been on the medication for a couple weeks?

## 2015-09-19 ENCOUNTER — Encounter: Payer: Self-pay | Admitting: Interventional Cardiology

## 2015-09-22 ENCOUNTER — Ambulatory Visit: Payer: 59 | Admitting: Medical

## 2015-09-23 ENCOUNTER — Ambulatory Visit (HOSPITAL_BASED_OUTPATIENT_CLINIC_OR_DEPARTMENT_OTHER): Payer: 59 | Attending: Medical | Admitting: Internal Medicine

## 2015-09-23 ENCOUNTER — Ambulatory Visit (HOSPITAL_BASED_OUTPATIENT_CLINIC_OR_DEPARTMENT_OTHER): Payer: 59

## 2015-09-23 DIAGNOSIS — G4733 Obstructive sleep apnea (adult) (pediatric): Secondary | ICD-10-CM | POA: Insufficient documentation

## 2015-09-23 DIAGNOSIS — R0902 Hypoxemia: Secondary | ICD-10-CM | POA: Insufficient documentation

## 2015-09-23 DIAGNOSIS — R0681 Apnea, not elsewhere classified: Secondary | ICD-10-CM

## 2015-09-23 DIAGNOSIS — R0683 Snoring: Secondary | ICD-10-CM | POA: Diagnosis present

## 2015-09-23 DIAGNOSIS — Z6841 Body Mass Index (BMI) 40.0 and over, adult: Secondary | ICD-10-CM | POA: Insufficient documentation

## 2015-09-24 ENCOUNTER — Other Ambulatory Visit: Payer: Self-pay

## 2015-09-24 DIAGNOSIS — G4733 Obstructive sleep apnea (adult) (pediatric): Secondary | ICD-10-CM

## 2015-09-24 NOTE — Progress Notes (Signed)
Needs order tomorrow for CPAP tytration. [facility] will schedule.

## 2015-09-25 ENCOUNTER — Ambulatory Visit (HOSPITAL_BASED_OUTPATIENT_CLINIC_OR_DEPARTMENT_OTHER): Payer: 59 | Attending: Medical | Admitting: Internal Medicine

## 2015-09-25 DIAGNOSIS — G4730 Sleep apnea, unspecified: Secondary | ICD-10-CM | POA: Diagnosis present

## 2015-09-25 DIAGNOSIS — R0683 Snoring: Secondary | ICD-10-CM | POA: Insufficient documentation

## 2015-09-25 DIAGNOSIS — G4733 Obstructive sleep apnea (adult) (pediatric): Secondary | ICD-10-CM | POA: Diagnosis not present

## 2015-09-27 DIAGNOSIS — R0681 Apnea, not elsewhere classified: Secondary | ICD-10-CM | POA: Diagnosis not present

## 2015-09-27 DIAGNOSIS — R0683 Snoring: Secondary | ICD-10-CM

## 2015-09-27 NOTE — Procedures (Signed)
  Patient Name: Jesse Duncan, Jesse Duncan Study Date: 09/25/2015 Gender: Male D.O.B: [birth year] Age (years): 30 Referring Provider: Crosby Oysteravid Duncan Height (inches): 72 Interpreting Physician: Jesse Duncan, Jesse Duncan Weight (lbs): 350 RPSGT: Jesse Duncan, Jesse Duncan BMI: 47 MRN: [id number] Neck Size: 22.00 CLINICAL INFORMATION The patient is referred for a CPAP titration to treat sleep apnea.   Date of NPSG, Split Night or HST: 09/23/15   unattended Home Sleep Test 09/23/15  AHI 90.7/ hr, desaturation to 54% SLEEP STUDY TECHNIQUE As per the AASM Manual for the Scoring of Sleep and Associated Events v2.3 (April 2016) with a hypopnea requiring 4% desaturations. The channels recorded and monitored were frontal, central and occipital EEG, electrooculogram (EOG), submentalis EMG (chin), nasal and oral airflow, thoracic and abdominal wall motion, anterior tibialis EMG, snore microphone, electrocardiogram, and pulse oximetry. Continuous positive airway pressure (CPAP) was initiated at the beginning of the study and titrated to treat sleep-disordered breathing.  MEDICATIONS Medications taken by the patient : charted for review Medications administered by patient during sleep study : No sleep medicine administered.  TECHNICIAN COMMENTS Comments added by technician: Patient had difficulty initiating sleep.  Comments added by scorer: N/A  RESPIRATORY PARAMETERS Optimal PAP Pressure (cm): 16 AHI at Optimal Pressure (/hr): 0.7 Overall Minimal O2 (%): 71.00 Supine % at Optimal Pressure (%): 100 Minimal O2 at Optimal Pressure (%): 90.0    SLEEP ARCHITECTURE The study was initiated at 10:08:25 PM and ended at 4:50:48 AM. Sleep onset time was 26.9 minutes and the sleep efficiency was 88.0%. The total sleep time was 354.0 minutes. The patient spent 5.51% of the night in stage N1 sleep, 64.27% in stage N2 sleep, 0.00% in stage N3 and 30.23% in REM.Stage REM latency was 41.5 minutes Wake after sleep onset was 21.5.  Alpha intrusion was absent. Supine sleep was 72.88%.  CARDIAC DATA The 2 lead EKG demonstrated sinus rhythm. The mean heart rate was 71.97 beats per minute. Other EKG findings include: None.  LEG MOVEMENT DATA The total Periodic Limb Movements of Sleep (PLMS) were 0. The PLMS index was 0.00. A PLMS index of <15 is considered normal in adults.  IMPRESSIONS - The optimal PAP pressure was 16 cm of water. - Central sleep apnea was not noted during this titration (CAI = 1.0/h). - Severe oxygen desaturations were observed during this titration (min O2 = 71.00%). - The patient snored with Moderate snoring volume during this titration study. - No cardiac abnormalities were observed during this study. - Clinically significant periodic limb movements were not noted during this study. Arousals associated with PLMs were rare.  DIAGNOSIS - Obstructive Sleep Apnea (327.23 [G47.33 ICD-10])  RECOMMENDATIONS - Trial of CPAP therapy on 16 cm H2O with a Large size Fisher&Paykel Full Face Mask Simplus mask and heated humidification. - Avoid alcohol, sedatives and other CNS depressants that may worsen sleep apnea and disrupt normal sleep architecture. - Sleep hygiene should be reviewed to assess factors that may improve sleep quality. - Weight management and regular exercise should be initiated or continued.  [Electronically signed] 09/27/2015 04:03 PM  Jesse Duhamellinton Maisey Deandrade Duncan, Jesse Duncan Diplomate, American Board of Sleep Medicine   NPI: [id number]  Waymon BudgeYOUNG,Quinley Nesler D Diplomate, American Board of Sleep Medicine  ELECTRONICALLY SIGNED ON:  09/27/2015, 4:00 PM [facility] PH: [phone]   FX: [phone] ACCREDITED BY THE AMERICAN ACADEMY OF SLEEP MEDICINE

## 2015-09-27 NOTE — Procedures (Signed)
    Patient Name: Jesse Duncan, Senan Study Date: 09/23/2015 Gender: Male D.O.B: 1985/06/08 Age (years): 30 Referring Provider: Crosby Oysteravid Tysinger Height (inches): 72 Interpreting Physician: Jetty Duhamellinton Young MD, ABSM Weight (lbs): 350 RPSGT: [place], [place] BMI: 47 MRN: [id number] Neck Size: 22.00 CLINICAL INFORMATION Sleep Study Type: unattended HST   Indication for sleep study: OSA, Snoring   Epworth Sleepiness Score: 20 SLEEP STUDY TECHNIQUE A multi-channel overnight portable sleep study was performed. The channels recorded were: nasal airflow, thoracic respiratory movement, and oxygen saturation with a pulse oximetry. Snoring was also monitored.  MEDICATIONS Patient self administered medications include: none reported during sleep study  SLEEP ARCHITECTURE Patient was studied for 468.3 minutes. The sleep efficiency was 93.3 % and the patient was supine for 65.2%. The arousal index was 0.0 per hour.  RESPIRATORY PARAMETERS The overall AHI was 90.7 per hour, with a central apnea index of 0.0 per hour. The oxygen nadir was 54% during sleep.  CARDIAC DATA Mean heart rate during sleep was 67.2 bpm.  IMPRESSIONS - Severe obstructive sleep apnea occurred during this study (AHI = 90.7/h). - No significant central sleep apnea occurred during this study (CAI = 0.0/h). - Severe oxygen desaturation was noted during this study (Min O2 = 54%). - Patient snored .  DIAGNOSIS - Obstructive Sleep Apnea (327.23 [G47.33 ICD-10]) - Nocturnal Hypoxemia (327.26 [G47.36 ICD-10])  RECOMMENDATIONS - CPAP titration - Positional therapy avoiding supine position during sleep. - Avoid alcohol, sedatives and other CNS depressants that may worsen sleep apnea and disrupt normal sleep architecture. - Sleep hygiene should be reviewed to assess factors that may improve sleep quality. - Weight management and regular exercise should be initiated or continued.  [Electronically signed] 09/27/2015  03:52 PM  Jetty Duhamellinton Young MD, ABSM Diplomate, American Board of Sleep Medicine   NPI: [id number]  Waymon BudgeYOUNG,CLINTON D Diplomate, American Board of Sleep Medicine  ELECTRONICALLY SIGNED ON:  09/27/2015, 3:53 PM [facility] PH: [phone]   FX: [phone] ACCREDITED BY THE AMERICAN ACADEMY OF SLEEP MEDICINE

## 2015-09-28 ENCOUNTER — Telehealth: Payer: Self-pay | Admitting: Medical

## 2015-09-28 NOTE — Telephone Encounter (Signed)
Set up home health for CPAP supplies ASAP.  Get him in to discuss sleep study results.

## 2015-09-29 NOTE — Telephone Encounter (Signed)
Pt has appointment 10/17 /17 and I have faxed order for cpap and supplies

## 2015-09-29 NOTE — Progress Notes (Signed)
Jesse HewsShane sees this pt he has an appointment to come in and discuss

## 2015-09-29 NOTE — Progress Notes (Signed)
Get him back in about a month.

## 2015-10-02 ENCOUNTER — Encounter (INDEPENDENT_AMBULATORY_CARE_PROVIDER_SITE_OTHER): Payer: Self-pay

## 2015-10-02 ENCOUNTER — Ambulatory Visit (INDEPENDENT_AMBULATORY_CARE_PROVIDER_SITE_OTHER): Payer: 59 | Admitting: Interventional Cardiology

## 2015-10-02 ENCOUNTER — Encounter: Payer: Self-pay | Admitting: Interventional Cardiology

## 2015-10-02 VITALS — BP 155/98 | HR 92 | Ht 73.0 in | Wt 362.8 lb

## 2015-10-02 DIAGNOSIS — I10 Essential (primary) hypertension: Secondary | ICD-10-CM

## 2015-10-02 DIAGNOSIS — I480 Paroxysmal atrial fibrillation: Secondary | ICD-10-CM | POA: Diagnosis not present

## 2015-10-02 DIAGNOSIS — I5042 Chronic combined systolic (congestive) and diastolic (congestive) heart failure: Secondary | ICD-10-CM

## 2015-10-02 NOTE — Patient Instructions (Signed)
Medication Instructions:  Please call our office back and let us know what medications and dosages you are on.   Labwork: None  Testing/Procedures: None  Follow-Up: Your physician wants you to follow-up in: 6 months with Dr. Katrinka BlazingSmith. You will receive a reminder letter in the mail two months in advance. If you don't receive a letter, please call our office to schedule the follow-up appointment.   Any Other Special Instructions Will Be Listed Below (If Applicable).     If you need a refill on your cardiac medications before your next appointment, please call your pharmacy.

## 2015-10-02 NOTE — Progress Notes (Signed)
Cardiology Office Note    Date:  10/02/2015   ID:  Jesse Duncan, DOB 09-Jun-1985, MRN [id number]  PCP:  Carollee Herter, MD  Cardiologist: Lesleigh Noe, MD   Chief Complaint  Patient presents with  . Atrial Fibrillation    History of Present Illness:  Jesse Duncan is a 30 y.o. male obstructive sleep apnea, paroxysmal atrial fibrillation requiring electrical cardioversion, hypertension, obesity, chronic combined systolic and diastolic heart failure.  Perhaps one brief episode of atrial fibrillation after cardioversion. He doesn't know what medications he takes. His medication list states that he is on Xarelto, losartan HCT, and diltiazem. He cannot verify this. He denies chest pain. No orthopnea PND. Recently diagnosed with sleep apnea. No therapy is been started yet.  When he developed atrial fibrillation he states that he had been awake for greater than 24 hours, he works 2 jobs, and had been using large volumes of caffeinated beverage.  Past Medical History:  Diagnosis Date  . Dyslipidemia   . Hypertension   . Obesity   . Smoker     No past surgical history on file.  Current Medications: Outpatient Medications Prior to Visit  Medication Sig Dispense Refill  . buPROPion (WELLBUTRIN XL) 150 MG 24 hr tablet Take 1 tablet (150 mg total) by mouth daily. 30 tablet 1  . diltiazem (CARDIZEM CD) 240 MG 24 hr capsule Take 1 capsule (240 mg total) by mouth daily. 30 capsule 6  . losartan-hydrochlorothiazide (HYZAAR) 100-25 MG tablet Take 1 tablet by mouth daily. 90 tablet 3  . rivaroxaban (XARELTO) 20 MG TABS tablet Take 1 tablet (20 mg total) by mouth daily with supper. 30 tablet 0  . aspirin EC 325 MG tablet Take 1 tablet (325 mg total) by mouth daily. (Patient not taking: Reported on 10/02/2015) 90 tablet 3  . Liraglutide -Weight Management (SAXENDA) 18 MG/3ML SOPN Inject 3 mg into the skin daily. (Patient not taking: Reported on 10/02/2015) 3 mL 3   No  facility-administered medications prior to visit.      Allergies:   Review of patient's allergies indicates no known allergies.   Social History   Social History  . Marital status: Single    Spouse name: N/A  . Number of children: N/A  . Years of education: N/A   Social History Main Topics  . Smoking status: Former Smoker    Years: 6.00    Types: Cigars    Quit date: 07/14/2015  . Smokeless tobacco: Never Used  . Alcohol use No  . Drug use: No  . Sexual activity: Not Currently   Other Topics Concern  . 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        Family History:  The patient's family history includes Cancer in his maternal grandfather; Healthy in his father; Hypertension in his mother; Stroke in his maternal grandmother.   ROS:   Please see the history of present illness.    Snoring, waking up at night short of breath.  All other systems reviewed and are negative.   PHYSICAL EXAM:   VS:  BP (!) 155/98   Pulse 92   Ht 6\' 1"  (1.854 m)   Wt (!) 362 lb 12.8 oz (164.6 kg)   BMI 47.87 kg/m    GEN: Well nourished, well developed, in no acute distress . Morbidly obese HEENT: normal  Neck: no JVD, carotid bruits, or masses Cardiac: RRR; no murmurs, rubs,  or gallops,no edema  Respiratory:  clear to auscultation bilaterally, normal work of breathing GI: soft, nontender, nondistended, + BS MS: no deformity or atrophy  Skin: warm and dry, no rash Neuro:  Alert and Oriented x 3, Strength and sensation are intact Psych: euthymic mood, full affect  Wt Readings from Last 3 Encounters:  10/02/15 (!) 362 lb 12.8 oz (164.6 kg)  09/25/15 (!) 350 lb (158.8 kg)  09/23/15 (!) 350 lb (158.8 kg)      Studies/Labs Reviewed:   EKG:  EKG  Not performed. Normal appearing after cardioversion in July revealing normal sinus rhythm, left axis deviation, and probable left anterior hemiblock. Suspicion of left ventricular hypertrophy  noted.  Recent Labs: 12/23/2014: ALT 30; TSH 3.417 07/22/2015: BUN 9; Creatinine, Ser 1.14; Hemoglobin 14.1; Platelets 305; Potassium 3.9; Sodium 138   Lipid Panel    Component Value Date/Time   CHOL 254 (H) 12/23/2014 0001   TRIG 268 (H) 12/23/2014 0001   HDL 43 12/23/2014 0001   CHOLHDL 5.9 (H) 12/23/2014 0001   VLDL 54 (H) 12/23/2014 0001   LDLCALC 157 (H) 12/23/2014 0001    Additional studies/ records that were reviewed today include:  Echocardiogram  08/04/15 Study Conclusions   - Left ventricle: The cavity size was mildly dilated. Wall   thickness was increased in a pattern of mild LVH. The estimated   ejection fraction was 40%. Diffuse hypokinesis. Prominent apical   trabeculations in some views raise concern for LV noncompaction.   Doppler parameters are consistent with abnormal left ventricular   relaxation (grade 1 diastolic dysfunction). - Aortic valve: There was no stenosis. - Mitral valve: There was no significant regurgitation. - Right ventricle: Poorly visualized. The cavity size was normal.   Systolic function was normal. - Pulmonary arteries: No complete TR doppler jet so unable to   estimate PA systolic pressure. - Inferior vena cava: The vessel was normal in size. The   respirophasic diameter changes were in the normal range (>= 50%),   consistent with normal central venous pressure.   Impressions:   - Mildly dilated LV with mild LV hypertrophy. EF 40%, diffuse   hypokinesis. Prominent apical trabeculation in some views raise   concern for LV noncompaction. Normal RV size and systolic   function.      ASSESSMENT:    1. Paroxysmal atrial fibrillation (HCC)   2. Essential hypertension   3. Morbid obesity due to excess calories (HCC)   4. Chronic combined systolic and diastolic heart failure (HCC)      PLAN:  In order of problems listed above:  1. No significant recurrences since cardioversion. He doesn't know what medication he is taking. See  the med list above. 2. On a moderate control. Plan to optimize therapy after we know what medications he is taking. I will like to add a beta blocker and perhaps get rid of diltiazem and the fact that LVEF is 40%. 3. Encourage decreased caloric intake and weight loss. 4. Needs optimize therapy for LVEF of 40%. We'll stay with losartan and perhaps switch out diltiazem 2 car vague low or metoprolol.    Medication Adjustments/Labs and Tests Ordered: Current medicines are reviewed at length with the patient today.  Concerns regarding medicines are outlined above.  Medication changes, Labs and Tests ordered today are listed in the Patient Instructions below. There are no Patient Instructions on file for this visit.   Signed, Lesleigh NoeHenry W Uniqua Kihn III, MD  10/02/2015 4:02 PM    [facility] [place], [place], [place]  [address] Phone: [phone]; Fax: [phone]

## 2015-10-20 ENCOUNTER — Telehealth: Payer: Self-pay | Admitting: Medical

## 2015-10-20 ENCOUNTER — Ambulatory Visit (INDEPENDENT_AMBULATORY_CARE_PROVIDER_SITE_OTHER): Payer: 59 | Admitting: Medical

## 2015-10-20 ENCOUNTER — Encounter: Payer: Self-pay | Admitting: Medical

## 2015-10-20 VITALS — BP 146/94 | HR 85 | Temp 98.2°F | Ht 73.0 in | Wt 366.8 lb

## 2015-10-20 DIAGNOSIS — I10 Essential (primary) hypertension: Secondary | ICD-10-CM

## 2015-10-20 DIAGNOSIS — I480 Paroxysmal atrial fibrillation: Secondary | ICD-10-CM | POA: Diagnosis not present

## 2015-10-20 DIAGNOSIS — G4733 Obstructive sleep apnea (adult) (pediatric): Secondary | ICD-10-CM | POA: Diagnosis not present

## 2015-10-20 HISTORY — DX: Obstructive sleep apnea (adult) (pediatric): G47.33

## 2015-10-20 MED ORDER — NALTREXONE-BUPROPION HCL ER 8-90 MG PO TB12: 2.0000 | ORAL_TABLET | Freq: Two times a day (BID) | ORAL | 1 refills | Status: DC

## 2015-10-20 MED ORDER — RIVAROXABAN 20 MG PO TABS: 20.0000 mg | ORAL_TABLET | Freq: Every day | ORAL | 5 refills | Status: DC

## 2015-10-20 NOTE — Telephone Encounter (Signed)
From yesterday's visit:  1) if Contrave is covered by insurance and affordable, then begin this for weight loss and stop plain Wellbutrin since Contrave contains generic Wellbutrin already 2) if he hasn't heard back from Home Health regarding CPAP today, let us know 3) continue Xarelto for the next 6 months unless cardiology decides otherwise in the meantime 4) try and start CPAP as soon as possible 5) continue efforts at weight loss, healthy diet.

## 2015-10-20 NOTE — Progress Notes (Signed)
Subjective: Chief Complaint  Patient presents with  . Weight Loss    follow up   Here for f/u from last visit where we referred for sleep study, we prescribed trial of Saxenda, and advised on weight loss recommendations.  Since last visit he wasn't able to get Saxenda as it wasn't covered by insurance and was very expensive.  He hasn't heard from home health about a CPAP.     He is working on eating healthy, trying to make changes to lose weight.  He has seen cardiology since last visit but not sure if he is suppose to continue on Xarelto.   He is not taking it currently.   Past Medical History:  Diagnosis Date  . Dyslipidemia   . Hypertension   . Obesity   . Smoker    Family History  Problem Relation Age of Onset  . Hypertension Mother   . Healthy Father   . Stroke Maternal Grandmother   . Cancer Maternal Grandfather   . Heart disease Neg Hx    Current Outpatient Prescriptions on File Prior to Visit  Medication Sig Dispense Refill  . buPROPion (WELLBUTRIN XL) 150 MG 24 hr tablet Take 1 tablet (150 mg total) by mouth daily. 30 tablet 1  . diltiazem (CARDIZEM CD) 240 MG 24 hr capsule Take 1 capsule (240 mg total) by mouth daily. 30 capsule 6  . losartan-hydrochlorothiazide (HYZAAR) 100-25 MG tablet Take 1 tablet by mouth daily. 90 tablet 3  . rivaroxaban (XARELTO) 20 MG TABS tablet Take 1 tablet (20 mg total) by mouth daily with supper. (Patient not taking: Reported on 10/20/2015) 30 tablet 0   No current facility-administered medications on file prior to visit.     ROS as in subjective   Objective: BP (!) 146/94 (BP Location: Right Arm, Patient Position: Sitting, Cuff Size: Large)   Pulse 85   Temp 98.2 F (36.8 C) (Oral)   Ht 6\' 1"  (1.854 m)   Wt (!) 366 lb 12.8 oz (166.4 kg)   SpO2 97%   BMI 48.39 kg/m    Wt Readings from Last 3 Encounters:  10/20/15 (!) 366 lb 12.8 oz (166.4 kg)  10/02/15 (!) 362 lb 12.8 oz (164.6 kg)  09/25/15 (!) 350 lb (158.8 kg)   BP  Readings from Last 3 Encounters:  10/20/15 (!) 146/94  10/02/15 (!) 155/98  08/15/15 (!) 198/128   General appearance: alert, no distress, WD/WN, obese AA male Oral cavity: MMM, no lesions, no small airway Neck: supple, no lymphadenopathy, no thyromegaly, no masses, no bruits Heart: RRR, normal S1, S2, no murmurs Lungs: CTA bilaterally, no wheezes, rhonchi, or rales Pulses: 1+ symmetric, upper and lower extremities, normal cap refill No obvious edema Neuro: CN2-12 intact, non focal exam, DTRs 2+, no papilledema     Assessment: Encounter Diagnoses  Name Primary?  . OSA (obstructive sleep apnea) Yes  . Essential hypertension   . Paroxysmal atrial fibrillation (HCC)   . Morbid obesity (HCC)      Plan: OSA - discussed his recent sleep study findings, severe OSA with oxygen nadir as low as 57%.  We will call home health and ascertain why he hasn't gotten a call about CPAP.   He needs to start CPAP ASAP.  HTN - c/t current medications for now, start CPAP ASAP, work on lifestyle changes  Afib - controlled on medication, recent cardioversion, reviewed recent cardiology notes  Obesity - will look into Contrave coverage on insurance vs seeing if there was a  prior [place] for Saxenda. C/t efforts at weight loss.   Cristal DeerChristopher was seen today for weight loss.  Diagnoses and all orders for this visit:  OSA (obstructive sleep apnea)  Essential hypertension  Paroxysmal atrial fibrillation (HCC)  Morbid obesity (HCC)   F/u pending call back

## 2015-10-21 ENCOUNTER — Other Ambulatory Visit: Payer: Self-pay

## 2015-10-21 NOTE — Telephone Encounter (Signed)
Spoke with pt- he states home health called. He was made aware of recommendations. He verbalized understanding. Trixie Rude/RLB

## 2015-11-30 ENCOUNTER — Other Ambulatory Visit: Payer: Self-pay | Admitting: Medical

## 2015-12-01 ENCOUNTER — Other Ambulatory Visit: Payer: Self-pay | Admitting: Medical

## 2015-12-01 ENCOUNTER — Telehealth: Payer: Self-pay | Admitting: Medical

## 2015-12-01 MED ORDER — BUPROPION HCL ER (XL) 150 MG PO TB24: 150.0000 mg | ORAL_TABLET | Freq: Every day | ORAL | 1 refills | Status: DC

## 2015-12-01 NOTE — Telephone Encounter (Signed)
After last visit he was suppose to let me know if contrave wasn't covered.  In this case I'll refill Wellbutrin.    He should be using CPAP, should be taking Xarelto and Cardizem!  Lets have him recheck office visit to discuss further.  When is his next cardiology consult

## 2015-12-01 NOTE — Telephone Encounter (Signed)
Spoke with pt- he reports that his insurance would not cover the Contrave. He is only taking Losartan-HCTZ, Wellbutrin. He has 12 pills of Wellbutrin left. He is not taking the Contrave.  He is also not taking the Cardizem or Xarelto listed on med list.  Advised pt that I would send this to you and then we would call him back with further advise. Trixie Rude/RLB

## 2015-12-01 NOTE — Telephone Encounter (Signed)
Last visit I wrote script for Contrave.  I assume he is taking this and started CPAP.  Please have him f/u in the near future assuming he has been doing both contrave and CPAP for at least 4-5 weeks.     I received a Wellbutrin refill request but am declining this as [facility]

## 2015-12-01 NOTE — Telephone Encounter (Signed)
Can this  Patient have this

## 2015-12-02 NOTE — Telephone Encounter (Signed)
Pt called and  To let know that he should be using his c-pap , explain to him about the meds. He said that  Needed to follow up with you about  New sleep study to see if you need to change anything , and he doesn't have an appt with cardo. Made him an appt for next mon 12/08/15.

## 2015-12-04 ENCOUNTER — Telehealth: Payer: Self-pay | Admitting: Medical

## 2015-12-04 NOTE — Telephone Encounter (Signed)
I received a compliance report for CPAP.   It says he is using the PAP 77% of the days in the month, but only about 43% of the time is he using for most of the night. Call and see how he is doing?  Is he taking his BP medication?  Does he have any recent pressure readings?  Is he sleeping better or able to see impairment on CPAP?  If having trouble with mask or not able to use the machine all night during sleep, he needs to contact the home health company to get it adjusted or work with the issue he is having.  Lets plan f/u visit in 3-4 wk.

## 2015-12-04 NOTE — Telephone Encounter (Signed)
Pt has no B/P readings he states he is taking his meds he does see a difference using the cpap and informed any issues he has with the machine to contact home health he verbalized understanding and has appointment this Monday

## 2015-12-08 ENCOUNTER — Encounter: Payer: Self-pay | Admitting: Medical

## 2015-12-08 ENCOUNTER — Telehealth: Payer: Self-pay | Admitting: Medical

## 2015-12-08 ENCOUNTER — Ambulatory Visit (INDEPENDENT_AMBULATORY_CARE_PROVIDER_SITE_OTHER): Payer: 59 | Admitting: Medical

## 2015-12-08 VITALS — BP 150/92 | HR 74 | Wt 364.8 lb

## 2015-12-08 DIAGNOSIS — I480 Paroxysmal atrial fibrillation: Secondary | ICD-10-CM | POA: Diagnosis not present

## 2015-12-08 DIAGNOSIS — G4733 Obstructive sleep apnea (adult) (pediatric): Secondary | ICD-10-CM | POA: Diagnosis not present

## 2015-12-08 DIAGNOSIS — I10 Essential (primary) hypertension: Secondary | ICD-10-CM

## 2015-12-08 MED ORDER — LOSARTAN POTASSIUM-HCTZ 100-25 MG PO TABS: 1.0000 | ORAL_TABLET | Freq: Every day | ORAL | 3 refills | Status: DC

## 2015-12-08 MED ORDER — BUPROPION HCL ER (XL) 150 MG PO TB24: 150.0000 mg | ORAL_TABLET | Freq: Every day | ORAL | 1 refills | Status: DC

## 2015-12-08 MED ORDER — RIVAROXABAN 20 MG PO TABS: 20.0000 mg | ORAL_TABLET | Freq: Every day | ORAL | 3 refills | Status: DC

## 2015-12-08 MED ORDER — DILTIAZEM HCL ER COATED BEADS 240 MG PO CP24: 240.0000 mg | ORAL_CAPSULE | Freq: Every day | ORAL | 3 refills | Status: DC

## 2015-12-08 NOTE — Telephone Encounter (Signed)
Please call pharmacy to verify he got his scripts filled today for Cardizem, Xarelto, Wellbutrin and Losartan HCT.   He came into saying the only refills he had on file was Losartan HCT and Wellbutrin.  My records showed active refills on all of them.  There was also a discrepancy I am concerned about.  I had written for Losartan HCT on 08/15/15 for #90 and 3 refills.  His pill bottle showed #30 and 1 refill.  What is there remark on this?

## 2015-12-08 NOTE — Progress Notes (Signed)
Subjective: Chief Complaint  Patient presents with  . med check    med check    Here for f/u on medications.   Of note, he reports only taking Losartan HCT and Wellbutrin.  He notes no other medications were available at pharmacy.   He has a hx/o HTN, paroxysmal afib, chronic combined systolic and diastolic heart failure, obesity, OSA, recently started CPAP.  Compliance report from last week reviewed.   His last cardiology visit was 10/02/15.   He does report feeling less fatigued using CPAP.  He does recognized he is taking the CPAP mask off during the night at times.  otherwise no new c/o.   Is exercising, trying to eat healthier than the last month.  No other aggravating or relieving factors. No other complaint.  Past Medical History:  Diagnosis Date  . Dyslipidemia   . Hypertension   . Obesity   . Smoker    No current outpatient prescriptions on file prior to visit.   No current facility-administered medications on file prior to visit.    ROS as in subjective  Objective BP (!) 150/92   Pulse 74   Wt (!) 364 lb 12.8 oz (165.5 kg)   SpO2 98%   BMI 48.13 kg/m   Wt Readings from Last 3 Encounters:  12/08/15 (!) 364 lb 12.8 oz (165.5 kg)  10/20/15 (!) 366 lb 12.8 oz (166.4 kg)  10/02/15 (!) 362 lb 12.8 oz (164.6 kg)   BP Readings from Last 3 Encounters:  12/08/15 (!) 150/92  10/20/15 (!) 146/94  10/02/15 (!) 155/98   General appearance: alert, no distress, WD/WN,  Neck: supple, no lymphadenopathy, no thyromegaly, no masses Heart: RRR, normal S1, S2, no murmurs Lungs: CTA bilaterally, no wheezes, rhonchi, or rales Ext: no edema Pulses: 2+ symmetric, upper and lower extremities, normal cap refill  Assessment: Encounter Diagnoses  Name Primary?  . Essential hypertension Yes  . Paroxysmal atrial fibrillation (HCC)   . OSA (obstructive sleep apnea)   . Morbid obesity (HCC)     Plan: After reviewing his medications, I prescribed Losartan HCT 08/15/15 for #90 and 3  refills.  However his pill bottle shows #30 and 1 refill.   His other medications show refills but he notes no refills were available, and states this is why he is not taking the Xarelto or Cardizem.     I reiterated the need to get back on Cardizem, xarelto, discussed diagnosis of Afrib and heart disease and possible complications.    C/t CPAP, restart Xarelto and Cardizem.  discussed risks/benefits of medication.  Asked him to check BPs at pharmacy and call report on BP in 62mo.    Cristal DeerChristopher was seen today for med check.  Diagnoses and all orders for this visit:  Essential hypertension  Paroxysmal atrial fibrillation (HCC)  OSA (obstructive sleep apnea)  Morbid obesity (HCC)  Other orders -     losartan-hydrochlorothiazide (HYZAAR) 100-25 MG tablet; Take 1 tablet by mouth daily. -     diltiazem (CARDIZEM CD) 240 MG 24 hr capsule; Take 1 capsule (240 mg total) by mouth daily. -     rivaroxaban (XARELTO) 20 MG TABS tablet; Take 1 tablet (20 mg total) by mouth daily with supper. -     buPROPion (WELLBUTRIN XL) 150 MG 24 hr tablet; Take 1 tablet (150 mg total) by mouth daily.

## 2015-12-08 NOTE — Telephone Encounter (Signed)
Called pharmacy & verified all 4 meds were filled & ready for pt to pick up and called pt and let him know all 4 were ready for pick up

## 2016-02-10 ENCOUNTER — Ambulatory Visit: Payer: 59 | Admitting: Medical

## 2016-02-12 ENCOUNTER — Encounter: Payer: Self-pay | Admitting: Medical

## 2016-05-25 ENCOUNTER — Encounter: Payer: Self-pay | Admitting: Family Medicine

## 2016-05-25 ENCOUNTER — Ambulatory Visit (INDEPENDENT_AMBULATORY_CARE_PROVIDER_SITE_OTHER): Payer: 59 | Admitting: Family Medicine

## 2016-05-25 VITALS — BP 140/90 | HR 68 | Temp 98.5°F | Wt 352.0 lb

## 2016-05-25 DIAGNOSIS — K529 Noninfective gastroenteritis and colitis, unspecified: Secondary | ICD-10-CM

## 2016-05-25 NOTE — Progress Notes (Signed)
   Subjective:    Patient ID: Jesse Duncan, male    DOB: Apr 25, 1985, 31 y.o.   MRN: [id number]  HPI He complains of a one-day history of nausea, vomiting, diarrhea and some abdominal pain but no fever, chills. He did eat seafood prior to this but no one else has been sick.   Review of Systems     Objective:   Physical Exam Alert and in no distress. Tympanic membranes and canals are normal. Pharyngeal area is normal. Neck is supple without adenopathy or thyromegaly. Cardiac exam shows a regular sinus rhythm without murmurs or gallops. Lungs are clear to auscultation. Abdominal exam shows normal bowel sounds without masses or tenderness        Assessment & Plan:  Acute gastroenteritis Recommend supportive care including liquids which she seems be able to keep down. Recommend Imodium if the diarrhea becomes an issue. Return here if further trouble.

## 2016-05-25 NOTE — Patient Instructions (Signed)
Use Imodium for the diarrhea to keep it under control and keep yourself hydrated. Start adding foods back when you feel up to it

## 2016-08-26 ENCOUNTER — Ambulatory Visit (INDEPENDENT_AMBULATORY_CARE_PROVIDER_SITE_OTHER): Payer: 59 | Admitting: Family Medicine

## 2016-08-26 ENCOUNTER — Encounter: Payer: Self-pay | Admitting: Family Medicine

## 2016-08-26 VITALS — BP 178/110 | HR 90 | Wt 325.2 lb

## 2016-08-26 DIAGNOSIS — G4733 Obstructive sleep apnea (adult) (pediatric): Secondary | ICD-10-CM

## 2016-08-26 DIAGNOSIS — E119 Type 2 diabetes mellitus without complications: Secondary | ICD-10-CM | POA: Diagnosis not present

## 2016-08-26 DIAGNOSIS — I10 Essential (primary) hypertension: Secondary | ICD-10-CM

## 2016-08-26 LAB — CBC WITH DIFFERENTIAL/PLATELET
Basophils Absolute: 43 cells/uL (ref 0–200)
Basophils Relative: 1 %
EOS PCT: 3 %
Eosinophils Absolute: 129 cells/uL (ref 15–500)
HCT: 43.6 % (ref 38.5–50.0)
HEMOGLOBIN: 14.4 g/dL (ref 13.2–17.1)
LYMPHS ABS: 2107 {cells}/uL (ref 850–3900)
Lymphocytes Relative: 49 %
MCH: 26.7 pg — ABNORMAL LOW (ref 27.0–33.0)
MCHC: 33 g/dL (ref 32.0–36.0)
MCV: 80.7 fL (ref 80.0–100.0)
MONO ABS: 344 {cells}/uL (ref 200–950)
MPV: 10 fL (ref 7.5–12.5)
Monocytes Relative: 8 %
NEUTROS ABS: 1677 {cells}/uL (ref 1500–7800)
NEUTROS PCT: 39 %
Platelets: 370 10*3/uL (ref 140–400)
RBC: 5.4 MIL/uL (ref 4.20–5.80)
RDW: 12.7 % (ref 11.0–15.0)
WBC: 4.3 10*3/uL (ref 4.0–10.5)

## 2016-08-26 LAB — GLUCOSE, POCT (MANUAL RESULT ENTRY): POC Glucose: 308 mg/dl — AB (ref 70–99)

## 2016-08-26 LAB — POCT GLYCOSYLATED HEMOGLOBIN (HGB A1C): Hemoglobin A1C: 11.8

## 2016-08-26 MED ORDER — DILTIAZEM HCL ER COATED BEADS 240 MG PO CP24: 240.0000 mg | ORAL_CAPSULE | Freq: Every day | ORAL | 3 refills | Status: DC

## 2016-08-26 MED ORDER — ONETOUCH DELICA LANCETS FINE MISC: 2 refills | Status: DC

## 2016-08-26 MED ORDER — LOSARTAN POTASSIUM-HCTZ 100-25 MG PO TABS: 1.0000 | ORAL_TABLET | Freq: Every day | ORAL | 3 refills | Status: DC

## 2016-08-26 MED ORDER — GLUCOSE BLOOD VI STRP: ORAL_STRIP | 2 refills | Status: DC

## 2016-08-26 MED ORDER — METFORMIN HCL 1000 MG PO TABS: 1000.0000 mg | ORAL_TABLET | Freq: Two times a day (BID) | ORAL | 3 refills | Status: DC

## 2016-08-26 NOTE — Addendum Note (Signed)
Addended by: Ronnald Nian on: 08/26/2016 04:44 PM   Modules accepted: Orders

## 2016-08-26 NOTE — Progress Notes (Signed)
   Subjective:    Patient ID: Jesse Duncan, male    DOB: 03/03/1985, 31 y.o.   MRN: [id number]  HPI He is here initially because he ran out of his blood pressure medications and needs a refill on them. He then mentioned a several week history of blurred vision, polyuria, polydipsia but no real weight change. He also has an underlying history of OSA and does state that he can definitely tell a difference between using the CPAP versus not but has not had a readout on this.   Review of Systems     Objective:   Physical Exam Alert and in no distress. Blood pressure is recorded. Blood sugar was then done which showed slightly over 300 with an A1c of 11.8.       Assessment & Plan:  New onset type 2 diabetes mellitus (HCC) - Plan: losartan-hydrochlorothiazide (HYZAAR) 100-25 MG tablet, diltiazem (CARDIZEM CD) 240 MG 24 hr capsule, Amb Referral to Nutrition and Diabetic E, metFORMIN (GLUCOPHAGE) 1000 MG tablet  Essential hypertension - Plan: losartan-hydrochlorothiazide (HYZAAR) 100-25 MG tablet, diltiazem (CARDIZEM CD) 240 MG 24 hr capsule  OSA (obstructive sleep apnea)  Morbid obesity (HCC) Blood pressure medications will be renewed. He is to get a CPAP readout concerning his OSA per documentation as we do know that it is helping. The remainder of the encounter was spent discussing his new onset diabetes. I will start him on insulin to get his blood sugars under control, give him metformin and see him in one week. We'll consider adding another medication to his regimen depending upon how he does on this. Discussed diabetes in regard to diet, exercise, foot care, eye care, medications, smoking. He was instructed on checking glucometer either before meal or 2 hours after meal. Is also instructed on how to give insulin injections. Over 45 minutes, greater than 50% spent in counseling and coordination of care.

## 2016-08-26 NOTE — Patient Instructions (Addendum)
Call [facility] and get a readout and make sure sent to me Go to the American diabetes Association website or [email or web address].org

## 2016-08-27 LAB — COMPREHENSIVE METABOLIC PANEL
ALBUMIN: 4.6 g/dL (ref 3.6–5.1)
ALT: 60 U/L — ABNORMAL HIGH (ref 9–46)
AST: 38 U/L (ref 10–40)
Alkaline Phosphatase: 79 U/L (ref 40–115)
BILIRUBIN TOTAL: 0.6 mg/dL (ref 0.2–1.2)
BUN: 13 mg/dL (ref 7–25)
CO2: 17 mmol/L — AB (ref 20–32)
CREATININE: 0.94 mg/dL (ref 0.60–1.35)
Calcium: 10.3 mg/dL (ref 8.6–10.3)
Chloride: 97 mmol/L — ABNORMAL LOW (ref 98–110)
GLUCOSE: 294 mg/dL — AB (ref 65–99)
Potassium: 4.7 mmol/L (ref 3.5–5.3)
SODIUM: 134 mmol/L — AB (ref 135–146)
Total Protein: 7.8 g/dL (ref 6.1–8.1)

## 2016-08-27 LAB — LIPID PANEL
Cholesterol: 354 mg/dL — ABNORMAL HIGH (ref <200)
HDL: 40 mg/dL — ABNORMAL LOW (ref >40)
Total CHOL/HDL Ratio: 8.9 Ratio — ABNORMAL HIGH (ref <5.0)
Triglycerides: 852 mg/dL — ABNORMAL HIGH (ref <150)

## 2016-09-02 ENCOUNTER — Ambulatory Visit (INDEPENDENT_AMBULATORY_CARE_PROVIDER_SITE_OTHER): Payer: 59 | Admitting: Family Medicine

## 2016-09-02 VITALS — BP 124/90 | HR 78 | Wt 322.0 lb

## 2016-09-02 DIAGNOSIS — I480 Paroxysmal atrial fibrillation: Secondary | ICD-10-CM | POA: Diagnosis not present

## 2016-09-02 DIAGNOSIS — E119 Type 2 diabetes mellitus without complications: Secondary | ICD-10-CM | POA: Diagnosis not present

## 2016-09-02 DIAGNOSIS — I10 Essential (primary) hypertension: Secondary | ICD-10-CM

## 2016-09-02 MED ORDER — RIVAROXABAN 20 MG PO TABS: 20.0000 mg | ORAL_TABLET | Freq: Every day | ORAL | 3 refills | Status: DC

## 2016-09-02 NOTE — Progress Notes (Signed)
   Subjective:    Patient ID: Jesse Duncan, male    DOB: [birth year], 31 y.o.   MRN: [id number]  HPI He is here for recheck. He has been giving himself insulin but only taking 1 metformin per day stating that he is not eating regular meals yet. He does state that he is less thirsty and urinating less often. He has had difficulty using the glucometer correctly. He would also like a refill on his Xarelto. He has a previous history of PAF but has not followed up with cardiologist. I reviewed that record and he should've been seen 6 months ago.   Review of Systems     Objective:   Physical Exam Alert and in no distress. Blood sugar today is 288.       Assessment & Plan:  Essential hypertension  Morbid obesity (HCC)  New onset type 2 diabetes mellitus (HCC)  Paroxysmal atrial fibrillation (HCC) - Plan: rivaroxaban (XARELTO) 20 MG TABS tablet, Ambulatory referral to Cardiology Discussed taking metformin twice per day and continuing on his Xarelto. He was instructed on proper use of the glucometer. He does continue to use his CPAP and seems be doing well with this. He will return here in 2 weeks for follow-up and answer further questions. Discussed the fact that after he is stable, we will see him 3 or 4 times per year.

## 2016-09-20 ENCOUNTER — Telehealth: Payer: Self-pay | Admitting: Internal Medicine

## 2016-09-20 ENCOUNTER — Ambulatory Visit: Payer: 59 | Admitting: Family Medicine

## 2016-09-20 MED ORDER — INSULIN GLARGINE 300 UNIT/ML ~~LOC~~ SOPN: 10.0000 [IU] | PEN_INJECTOR | Freq: Every day | SUBCUTANEOUS | 0 refills | Status: DC

## 2016-09-20 NOTE — Telephone Encounter (Signed)
Pt needs a refill on his toujeo insulin. He is doing 10 units. Pt was advised that he could come and pick up a sample

## 2016-09-22 ENCOUNTER — Ambulatory Visit: Payer: 59 | Admitting: Registered"

## 2016-11-02 ENCOUNTER — Ambulatory Visit: Payer: 59 | Admitting: Family Medicine

## 2016-11-03 ENCOUNTER — Ambulatory Visit: Payer: Self-pay | Admitting: Interventional Cardiology

## 2016-11-30 ENCOUNTER — Telehealth: Payer: Self-pay | Admitting: Family Medicine

## 2016-11-30 NOTE — Telephone Encounter (Signed)
  Patient wants to know if he can get another sample of insulin injection . Started new job and doesn't have insurance until 01/04/17 Had to reschedule diabetes follow up for 01/06/17 due to not having insurance until 2019

## 2016-12-02 ENCOUNTER — Ambulatory Visit: Payer: Self-pay | Admitting: Family Medicine

## 2017-01-06 ENCOUNTER — Ambulatory Visit: Payer: Self-pay | Admitting: Family Medicine

## 2017-01-14 ENCOUNTER — Encounter (INDEPENDENT_AMBULATORY_CARE_PROVIDER_SITE_OTHER): Payer: Self-pay

## 2017-01-14 ENCOUNTER — Encounter: Payer: Self-pay | Admitting: Interventional Cardiology

## 2017-01-14 ENCOUNTER — Ambulatory Visit (INDEPENDENT_AMBULATORY_CARE_PROVIDER_SITE_OTHER): Payer: 59 | Admitting: Interventional Cardiology

## 2017-01-14 VITALS — BP 176/104 | HR 80 | Ht 73.0 in | Wt 337.8 lb

## 2017-01-14 DIAGNOSIS — E119 Type 2 diabetes mellitus without complications: Secondary | ICD-10-CM

## 2017-01-14 DIAGNOSIS — E782 Mixed hyperlipidemia: Secondary | ICD-10-CM

## 2017-01-14 DIAGNOSIS — I10 Essential (primary) hypertension: Secondary | ICD-10-CM

## 2017-01-14 DIAGNOSIS — I480 Paroxysmal atrial fibrillation: Secondary | ICD-10-CM | POA: Diagnosis not present

## 2017-01-14 DIAGNOSIS — G4733 Obstructive sleep apnea (adult) (pediatric): Secondary | ICD-10-CM | POA: Diagnosis not present

## 2017-01-14 MED ORDER — ASPIRIN EC 81 MG PO TBEC: 81.0000 mg | DELAYED_RELEASE_TABLET | Freq: Every day | ORAL | 3 refills | Status: DC

## 2017-01-14 MED ORDER — LOSARTAN POTASSIUM-HCTZ 100-25 MG PO TABS: 1.0000 | ORAL_TABLET | Freq: Every day | ORAL | 3 refills | Status: DC

## 2017-01-14 MED ORDER — DILTIAZEM HCL ER COATED BEADS 240 MG PO CP24: 240.0000 mg | ORAL_CAPSULE | Freq: Every day | ORAL | 3 refills | Status: DC

## 2017-01-14 NOTE — Patient Instructions (Signed)
Medication Instructions:  1) DISCONTINUE Xarelto 2) START Aspirin 81mg  once daily  Labwork: None  Testing/Procedures: None  Follow-Up: Your physician recommends that you schedule a follow-up appointment in: 1 month with a PA or NP with an EKG.  Your physician wants you to follow-up in: 1 years with Dr. Katrinka BlazingSmith.  You will receive a reminder letter in the mail two months in advance. If you don't receive a letter, please call our office to schedule the follow-up appointment.    Any Other Special Instructions Will Be Listed Below (If Applicable).     If you need a refill on your cardiac medications before your next appointment, please call your pharmacy.

## 2017-01-14 NOTE — Progress Notes (Signed)
Cardiology Office Note    Date:  01/14/2017   ID:  Jesse Duncan, DOB [birth year], MRN [id number]  PCP:  Ronnald Nian, MD  Cardiologist: Lesleigh Noe, MD   Chief Complaint  Patient presents with  . Atrial Fibrillation  . Hypertension    History of Present Illness:  Jesse Duncan is a 32 y.o. male obstructive sleep apnea, paroxysmal atrial fibrillation requiring electrical cardioversion, hypertension, obesity, chronic combined systolic and diastolic heart failure.   Jesse Duncan has run out of his medications is here today for follow-up.  Last OV with me was September 2018.  Has a history of paroxysmal atrial fibrillation.  He is on diltiazem and Xarelto as A. fib therapies.  He has had no symptoms to suggest recurrent A. fib.  He is also run out of his Hyzaar.  The primary care physician is Dr. Sharlot Gowda.   Past Medical History:  Diagnosis Date  . Allergic rhinitis due to pollen 07/06/2010  . Atrial fibrillation (HCC) 08/15/2015  . Dyslipidemia   . Hyperlipidemia 05/25/2010  . Hypertension   . Impaired fasting blood sugar 08/15/2015  . Obesity   . OSA (obstructive sleep apnea) 10/20/2015  . Smoker   . Tobacco use disorder 11/14/2013    History reviewed. No pertinent surgical history.  Current Medications: Outpatient Medications Prior to Visit  Medication Sig Dispense Refill  . buPROPion (WELLBUTRIN XL) 150 MG 24 hr tablet Take 1 tablet (150 mg total) by mouth daily. 90 tablet 1  . glucose blood test strip Test twice a day. Pt uses one touch verio flex 100 each 2  . Insulin Glargine (TOUJEO SOLOSTAR) 300 UNIT/ML SOPN Inject 10 Units into the skin daily. 3 mL 0  . metFORMIN (GLUCOPHAGE) 1000 MG tablet Take 1 tablet (1,000 mg total) by mouth 2 (two) times daily with a meal. 180 tablet 3  . ONETOUCH DELICA LANCETS FINE MISC Test twice a day. Pt uses one touch verio flex 100 each 2  . diltiazem (CARDIZEM CD) 240 MG 24 hr capsule Take 1 capsule (240 mg total) by  mouth daily. 90 capsule 3  . losartan-hydrochlorothiazide (HYZAAR) 100-25 MG tablet Take 1 tablet by mouth daily. 90 tablet 3  . rivaroxaban (XARELTO) 20 MG TABS tablet Take 1 tablet (20 mg total) by mouth daily with supper. 90 tablet 3   No facility-administered medications prior to visit.      Allergies:   Patient has no known allergies.   Social History   Socioeconomic History  . Marital status: Single    Spouse name: None  . Number of children: None  . Years of education: None  . Highest education level: None  Social Needs  . Financial resource strain: None  . Food insecurity - worry: None  . Food insecurity - inability: None  . Transportation needs - medical: None  . Transportation needs - non-medical: None  Occupational History  . None  Tobacco Use  . Smoking status: Light Tobacco Smoker    Years: 6.00    Types: Cigars, Cigarettes    Last attempt to quit: 07/14/2015    Years since quitting: 1.5  . Smokeless tobacco: Never Used  Substance and Sexual Activity  . Alcohol use: Yes    Alcohol/week: 0.6 oz    Types: 1 Standard drinks or equivalent per week    Comment: social   . Drug use: No  . Sexual activity: Not Currently  Other Topics Concern  . 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        Family History:  The patient's family history includes Cancer in his maternal grandfather; Healthy in his father; Hypertension in his mother; Stroke in his maternal grandmother.   ROS:   Please see the history of present illness.    Has run out of most of his medications.  No blood pressure or A. fib medication. All other systems reviewed and are negative.   PHYSICAL EXAM:   VS:  BP (!) 176/104   Pulse 80   Ht 6\' 1"  (1.854 m)   Wt (!) 337 lb 12.8 oz (153.2 kg)   BMI 44.57 kg/m    GEN: Well nourished, well developed, in no acute distress.  Morbidly obese. HEENT: normal  Neck: no JVD, carotid bruits, or masses Cardiac:  RRR; no murmurs, rubs, or gallops,no edema  Respiratory:  clear to auscultation bilaterally, normal work of breathing GI: soft, nontender, nondistended, + BS MS: no deformity or atrophy  Skin: warm and dry, no rash Neuro:  Alert and Oriented x 3, Strength and sensation are intact Psych: euthymic mood relatively short PR, nonspecific T wave abnormality.  Full affect  Wt Readings from Last 3 Encounters:  01/14/17 (!) 337 lb 12.8 oz (153.2 kg)  09/02/16 (!) 322 lb (146.1 kg)  08/26/16 (!) 325 lb 3.2 oz (147.5 kg)      Studies/Labs Reviewed:   EKG:  EKG normal sinus rhythm  Recent Labs: 08/26/2016: ALT 60; BUN 13; Creat 0.94; Hemoglobin 14.4; Platelets 370; Potassium 4.7; Sodium 134   Lipid Panel    Component Value Date/Time   CHOL 354 (H) 08/26/2016 1546   TRIG 852 (H) 08/26/2016 1546   HDL 40 (L) 08/26/2016 1546   CHOLHDL 8.9 (H) 08/26/2016 1546   VLDL NOT CALC 08/26/2016 1546   LDLCALC NOT CALC 08/26/2016 1546    Additional studies/ records that were reviewed today include:  None    ASSESSMENT:    1. Paroxysmal atrial fibrillation (HCC)   2. Essential hypertension   3. New onset type 2 diabetes mellitus (HCC)   4. Obstructive sleep apnea   5. Morbid obesity (HCC)   6. Mixed hyperlipidemia      PLAN:  In order of problems listed above:  1. No clinical recurrence of atrial fibrillation.  Now on CPAP for sleep apnea.  Xarelto has been discontinued.  Aspirin 81 mg/day substituted. 2. Resume Hyzaar and diltiazem.  Return in 1 month for blood pressure recheck. 3. Not addressed 4. CPAP use is mandatory 5. Encouraged decrease caloric intake, exercise, to facilitate weight loss.   Clinical follow-up in 1 year.  Continue antihypertensive therapy.  Continue baby aspirin per day.  If recurrent atrial fibrillation would need lifelong anticoagulation therapy.    Medication Adjustments/Labs and Tests Ordered: Current medicines are reviewed at length with the patient  today.  Concerns regarding medicines are outlined above.  Medication changes, Labs and Tests ordered today are listed in the Patient Instructions below. Patient Instructions  Medication Instructions:  1) DISCONTINUE Xarelto 2) START Aspirin 81mg  once daily  Labwork: None  Testing/Procedures: None  Follow-Up: Your physician recommends that you schedule a follow-up appointment in: 1 month with a PA or NP with an EKG.  Your physician wants you to follow-up in: 1 years with Dr. [name].  You will receive a reminder letter in the mail two months in advance. If you don't receive a letter, please call our office to schedule  the follow-up appointment.    Any Other Special Instructions Will Be Listed Below (If Applicable).     If you need a refill on your cardiac medications before your next appointment, please call your pharmacy.      Signed, Lesleigh NoeHenry W Levis Nazir III, MD  01/14/2017 4:52 PM    [facility] [address], [place], [place]  [address] Phone: [phone]; Fax: [phone]

## 2017-01-19 ENCOUNTER — Other Ambulatory Visit: Payer: Self-pay | Admitting: *Deleted

## 2017-01-19 MED ORDER — INSULIN GLARGINE 300 UNIT/ML ~~LOC~~ SOPN: 10.0000 [IU] | PEN_INJECTOR | Freq: Every day | SUBCUTANEOUS | 0 refills | Status: DC

## 2017-01-20 ENCOUNTER — Ambulatory Visit (INDEPENDENT_AMBULATORY_CARE_PROVIDER_SITE_OTHER): Payer: 59 | Admitting: Family Medicine

## 2017-01-20 ENCOUNTER — Encounter: Payer: Self-pay | Admitting: Family Medicine

## 2017-01-20 VITALS — BP 164/110 | HR 83 | Wt 337.0 lb

## 2017-01-20 DIAGNOSIS — I10 Essential (primary) hypertension: Secondary | ICD-10-CM | POA: Diagnosis not present

## 2017-01-20 DIAGNOSIS — E119 Type 2 diabetes mellitus without complications: Secondary | ICD-10-CM

## 2017-01-20 MED ORDER — DILTIAZEM HCL ER COATED BEADS 240 MG PO CP24: 240.0000 mg | ORAL_CAPSULE | Freq: Every day | ORAL | 3 refills | Status: DC

## 2017-01-20 MED ORDER — LOSARTAN POTASSIUM-HCTZ 100-25 MG PO TABS: 1.0000 | ORAL_TABLET | Freq: Every day | ORAL | 3 refills | Status: DC

## 2017-01-20 NOTE — Progress Notes (Signed)
   Subjective:    Patient ID: Jesse Duncan, male    DOB: [birth year], 32 y.o.   MRN: [id number]  HPI He is here for consult concerning his blood pressure.  He ran out of his medications 2 weeks ago.  He also was supposed to go to the diabetes and education program but his insurance changed and he has not called them back.  Alert and in no distress.  Blood pressure is recorded.   Review of Systems     Objective:   Physical Exam Phone number given for diabetes and nutrition.      Assessment & Plan:  Essential hypertension - Plan: diltiazem (CARDIZEM CD) 240 MG 24 hr capsule, losartan-hydrochlorothiazide (HYZAAR) 100-25 MG tablet  Morbid obesity (HCC)  Type 2 diabetes mellitus without complication, without long-term current use of insulin (HCC) His medications were renewed.  He is to return here in 1 month for diabetes recheck as well as his blood pressure.

## 2017-02-03 ENCOUNTER — Encounter: Payer: Self-pay | Admitting: Dietician

## 2017-02-03 ENCOUNTER — Encounter: Payer: 59 | Attending: Family Medicine | Admitting: Dietician

## 2017-02-03 DIAGNOSIS — Z713 Dietary counseling and surveillance: Secondary | ICD-10-CM | POA: Insufficient documentation

## 2017-02-03 DIAGNOSIS — E119 Type 2 diabetes mellitus without complications: Secondary | ICD-10-CM | POA: Diagnosis not present

## 2017-02-03 NOTE — Progress Notes (Signed)
Patient was seen on 02/03/17 for the first of a series of three diabetes self-management courses at the [facility].  Patient Education Plan per assessed needs and concerns is to attend three course education program for Diabetes Self Management Education.  The following learning objectives were met by the patient during this class:  Describe diabetes  State some common risk factors for diabetes  Defines the role of glucose and insulin  Identifies type of diabetes and pathophysiology  Describe the relationship between diabetes and cardiovascular risk  State the members of the Healthcare Team  States the rationale for glucose monitoring  State when to test glucose  State their individual Target Range  State the importance of logging glucose readings  Describe how to interpret glucose readings  Identifies A1C target  Explain the correlation between A1c and eAG values  State symptoms and treatment of high blood glucose  State symptoms and treatment of low blood glucose  Explain proper technique for glucose testing  Identifies proper sharps disposal  Handouts given during class include:  Living Well with Diabetes book  Carb Counting and Meal Planning book  Meal Plan Card  Meal planning worksheet  Low Sodium Flavoring Tips  Types of Fats  The diabetes portion plate  K8M to eAG Conversion Chart  Diabetes Recommended Care Schedule  Support Group  Diabetes Success Plan  Core Class Satisfaction Survey   Follow-Up Plan:  Attend core 2

## 2017-02-10 ENCOUNTER — Encounter: Payer: 59 | Attending: Family Medicine | Admitting: Dietician

## 2017-02-10 DIAGNOSIS — E119 Type 2 diabetes mellitus without complications: Secondary | ICD-10-CM | POA: Diagnosis not present

## 2017-02-10 DIAGNOSIS — Z713 Dietary counseling and surveillance: Secondary | ICD-10-CM | POA: Insufficient documentation

## 2017-02-10 NOTE — Progress Notes (Signed)
Patient was seen on 02/10/17 for the second of a series of three diabetes self-management courses at the [facility]. The following learning objectives were met by the patient during this class:   Describe the role of different macronutrients on glucose  Explain how carbohydrates affect blood glucose  State what foods contain the most carbohydrates  Demonstrate carbohydrate counting  Demonstrate how to read Nutrition Facts food label  Describe effects of various fats on heart health  Describe the importance of good nutrition for health and healthy eating strategies  Describe techniques for managing your shopping, cooking and meal planning  List strategies to follow meal plan when dining out  Describe the effects of alcohol on glucose and how to use it safely  Goals:  Follow Diabetes Meal Plan as instructed  Aim to spread carbs evenly throughout the day  Aim for 3 meals per day and snacks as needed Include lean protein foods to meals/snacks  Monitor glucose levels as instructed by your doctor   Follow-Up Plan:  Attend Core 3  Work towards following your personal food plan.   

## 2017-02-17 ENCOUNTER — Encounter: Payer: 59 | Admitting: Dietician

## 2017-02-17 DIAGNOSIS — Z713 Dietary counseling and surveillance: Secondary | ICD-10-CM | POA: Diagnosis not present

## 2017-02-17 DIAGNOSIS — E119 Type 2 diabetes mellitus without complications: Secondary | ICD-10-CM

## 2017-02-17 NOTE — Progress Notes (Signed)
Patient was seen on 02/17/17 for the third of a series of three diabetes self-management courses at the [facility].   Jesse Duncan. State the amount of activity recommended for healthy living . Describe activities suitable for individual needs . Identify ways to regularly incorporate activity into daily life . Identify barriers to activity and ways to over come these barriers  Identify diabetes medications being personally used and their primary action for lowering glucose and possible side effects . Describe role of stress on blood glucose and develop strategies to address psychosocial issues . Identify diabetes complications and ways to prevent them  Explain how to manage diabetes during illness . Evaluate success in meeting personal goal . Establish 2-3 goals that they will plan to diligently work on  Goals:   I will count my carb choices at most meals and snacks  I will be active 30 minutes or more 4 times a week  I will take my diabetes medications as scheduled  I will eat less unhealthy fats by eating smaller portions and more veggies  I will look at patterns in my record book at least 3 days a month  To help manage stress I will  exercise at least 4 times a week  Your patient has identified these potential barriers to change:  Motivation  Your patient has identified their diabetes self-care support plan as  Family Support    Plan:  Attend Support Group as desired

## 2017-02-18 ENCOUNTER — Ambulatory Visit (INDEPENDENT_AMBULATORY_CARE_PROVIDER_SITE_OTHER): Payer: 59 | Admitting: Cardiology

## 2017-02-18 ENCOUNTER — Encounter: Payer: Self-pay | Admitting: Cardiology

## 2017-02-18 VITALS — BP 160/94 | HR 69 | Ht 73.0 in | Wt 348.0 lb

## 2017-02-18 DIAGNOSIS — G4733 Obstructive sleep apnea (adult) (pediatric): Secondary | ICD-10-CM | POA: Diagnosis not present

## 2017-02-18 DIAGNOSIS — E782 Mixed hyperlipidemia: Secondary | ICD-10-CM

## 2017-02-18 DIAGNOSIS — I480 Paroxysmal atrial fibrillation: Secondary | ICD-10-CM | POA: Diagnosis not present

## 2017-02-18 DIAGNOSIS — I10 Essential (primary) hypertension: Secondary | ICD-10-CM | POA: Diagnosis not present

## 2017-02-18 NOTE — Progress Notes (Signed)
Cardiology Office Note:    Date:  02/18/2017   ID:  Jesse Duncan, DOB Feb 27, 1985, MRN [id number]  PCP:  Ronnald Nian, MD  Cardiologist:  Lesleigh Noe, MD  Referring MD: Ronnald Nian, MD   Chief Complaint  Patient presents with  . Follow-up    Blood pressure    History of Present Illness:    Jesse Duncan is a 32 y.o. male with a past medical history significant for obstructive sleep apnea on CPAP, PAF requiring electrical cardioversion, HTN, DM on insulin, obesity and chronic combined systolic and diastolic heart failure.  He was last seen in the office by Dr. [name] on 01/14/17 at which time he had run out of his medications. His BP was elevated. Jesse Duncan has had no clinical recurrence of atrial fibrillation. He is treated with diltiazem. Anticoagulation with Xarelto has been stopped and ehis taking aspirin 81 mg.   He had follow up with his PCP for uncontrolled BP on 01/20/17 and had been out of his meds for about 2 weeks. His losartan HCTZ and diltiazem were renewed. His BP is elevated today. He has a follow up with his PCP next week.   Jesse Duncan is here today for follow up of blood pressure and check EKG. He says that he lost his medications on a plane 2 weeks ago and has not been taking his meds. He denies any chest discomfort, dyspnea on exertion, palpitations or fast heartbeats, lightheadedness, edema. He is not smoking cigarettes anymore but is smoking a Hookah pipe once or twice a week. He is no longer having frequent urination and thirst that he had when first diagnosed with diabetes. He has just completed a 3 week diabetes education course.    Past Medical History:  Diagnosis Date  . Allergic rhinitis due to pollen 07/06/2010  . Atrial fibrillation (HCC) 08/15/2015  . Diabetes mellitus without complication (HCC)   . Dyslipidemia   . Hyperlipidemia 05/25/2010  . Hypertension   . Impaired fasting blood sugar 08/15/2015  . Obesity   . OSA (obstructive  sleep apnea) 10/20/2015  . Sleep apnea   . Smoker   . Tobacco use disorder 11/14/2013    History reviewed. No pertinent surgical history.  Current Medications: Current Meds  Medication Sig  . aspirin EC 81 MG tablet Take 1 tablet (81 mg total) by mouth daily.  Marland Kitchen buPROPion (WELLBUTRIN XL) 150 MG 24 hr tablet Take 1 tablet (150 mg total) by mouth daily.  Marland Kitchen diltiazem (CARDIZEM CD) 240 MG 24 hr capsule Take 1 capsule (240 mg total) by mouth daily.  Marland Kitchen glucose blood test strip Test twice a day. Pt uses one touch verio flex  . Insulin Glargine (TOUJEO SOLOSTAR) 300 UNIT/ML SOPN Inject 10 Units into the skin daily.  Marland Kitchen losartan-hydrochlorothiazide (HYZAAR) 100-25 MG tablet Take 1 tablet by mouth daily.  . metFORMIN (GLUCOPHAGE) 1000 MG tablet Take 1 tablet (1,000 mg total) by mouth 2 (two) times daily with a meal.  . ONETOUCH DELICA LANCETS FINE MISC Test twice a day. Pt uses one touch verio flex     Allergies:   Patient has no known allergies.   Social History   Socioeconomic History  . Marital status: Single    Spouse name: None  . Number of children: None  . Years of education: None  . Highest education level: None  Social Needs  . Financial resource strain: None  . Food insecurity - worry: None  . Food insecurity -  inability: None  . Transportation needs - medical: None  . Transportation needs - non-medical: None  Occupational History  . None  Tobacco Use  . Smoking status: Light Tobacco Smoker    Years: 6.00    Types: Cigars, E-cigarettes    Last attempt to quit: 07/14/2015    Years since quitting: 1.6  . Smokeless tobacco: Never Used  . Tobacco comment: sometimes smokes from a hooka pipe  Substance and Sexual Activity  . Alcohol use: Yes    Alcohol/week: 0.6 oz    Types: 1 Standard drinks or equivalent per week    Comment: social   . Drug use: No  . Sexual activity: Not Currently  Other Topics Concern  . 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        Family History: The patient's family history includes Cancer in his maternal grandfather; Healthy in his father; Hypertension in his mother; Stroke in his maternal grandmother. There is no history of Heart disease. ROS:   Please see the history of present illness.     All other systems reviewed and are negative.  EKGs/Labs/Other Studies Reviewed:    The following studies were reviewed today:  Echocardiogram 08/04/2015 Study Conclusions - Left ventricle: The cavity size was mildly dilated. Wall   thickness was increased in a pattern of mild LVH. The estimated   ejection fraction was 40%. Diffuse hypokinesis. Prominent apical   trabeculations in some views raise concern for LV noncompaction.   Doppler parameters are consistent with abnormal left ventricular   relaxation (grade 1 diastolic dysfunction). - Aortic valve: There was no stenosis. - Mitral valve: There was no significant regurgitation. - Right ventricle: Poorly visualized. The cavity size was normal.   Systolic function was normal. - Pulmonary arteries: No complete TR doppler jet so unable to   estimate PA systolic pressure. - Inferior vena cava: The vessel was normal in size. The   respirophasic diameter changes were in the normal range (>= 50%),   consistent with normal central venous pressure.  Impressions: - Mildly dilated LV with mild LV hypertrophy. EF 40%, diffuse   hypokinesis. Prominent apical trabeculation in some views raise   concern for LV noncompaction. Normal RV size and systolic   function.  EKG:  EKG is ordered today.  The ekg ordered today demonstrates NSR at 69 bpm.   Recent Labs: 08/26/2016: ALT 60; BUN 13; Creat 0.94; Hemoglobin 14.4; Platelets 370; Potassium 4.7; Sodium 134   Recent Lipid Panel    Component Value Date/Time   CHOL 354 (H) 08/26/2016 1546   TRIG 852 (H) 08/26/2016 1546   HDL 40 (L) 08/26/2016 1546   CHOLHDL 8.9 (H) 08/26/2016 1546   VLDL  NOT CALC 08/26/2016 1546   LDLCALC NOT CALC 08/26/2016 1546    Physical Exam:    VS:  BP (!) 160/94   Pulse 69   Ht 6\' 1"  (1.854 m)   Wt (!) 348 lb (157.9 kg)   SpO2 95%   BMI 45.91 kg/m     Wt Readings from Last 3 Encounters:  02/18/17 (!) 348 lb (157.9 kg)  01/20/17 (!) 337 lb (152.9 kg)  01/14/17 (!) 337 lb 12.8 oz (153.2 kg)     Physical Exam  Constitutional: He is oriented to person, place, and time.  Obese male  HENT:  Head: Normocephalic and atraumatic.  Neck: Normal range of motion. Neck supple. No JVD present.  Cardiovascular:  Normal rate, regular rhythm, normal heart sounds and intact distal pulses. Exam reveals no gallop and no friction rub.  No murmur heard. Pulmonary/Chest: Effort normal and breath sounds normal. No respiratory distress. He has no wheezes. He has no rales.  Abdominal: Soft. Bowel sounds are normal.  Musculoskeletal: Normal range of motion. He exhibits no edema.  Neurological: He is alert and oriented to person, place, and time.  Skin: Skin is warm and dry.  Psychiatric: He has a normal mood and affect. His behavior is normal. Thought content normal.    ASSESSMENT:    1. Essential hypertension   2. Paroxysmal atrial fibrillation (HCC)   3. OSA (obstructive sleep apnea)   4. Morbid obesity (HCC)   5. Mixed hyperlipidemia    PLAN:    In order of problems listed above:  Hypertension: Managed with diltiazem and Hyzaar 100-25mg  daily. Pt was out of his meds for several weeks and BP at last appt was 176/104. Meds were resumed but he left them on a plane and has not been medicated for about 2 weeks. BP still elevated. I discussed the importance of controlling BP to reduce his cardiovascular risk with pt. I advised him to go to his pharmacy to see if they would replace his meds. He has a follow up with his PCP next week. We also discussed lifestyle changes including diet, exercise, and wt loss.   Hx of paroxysmal atrial fibrillation: No clinical  evidence of recurrence. Continues on diltiazem (when he has it). No longer on Xarelto, on aspirin 81 mg. EKG shows sinus rhythm. No clinical features of recurrent afib.   OSA: On CPAP. It is very important for pt to use CPAP to prevent recurrence of arrhythmia.   Obesity: Body mass index is 45.91 kg/m. The patient has gained 23 pounds since diagnosed with DM and starting insulin in August. We discussed diet, portion control and exercise. He is interested in Lap Band surgery. I advised him to discuss with his PCP next week and to try to work with a nutritionist first, try the above lifestyle changes and then consider surgery if unsuccessful at wt loss.   Type 2 DM on insulin: Hgb A1c was 11.8 on 08/26/16 when first diagnosed with diabetes. Management per PCP. Pt has just completed a 3 week diabetes education program.   Hyperlipidemia: Triglycerides 852 in 08/2016. Total chol 354. Pt is at risk for pancreatitis. I discussed this with the patient advised to start fenofibrate. The patient wants to recheck his lipid panel since his blood sugar has been treated. He states that he has been reducing his intake of sweets. He will consider starting medication if needed based on his labs.    Medication Adjustments/Labs and Tests Ordered: Current medicines are reviewed at length with the patient today.  Concerns regarding medicines are outlined above. Labs and tests ordered and medication changes are outlined in the patient instructions below:  Patient Instructions  Medication Instructions:  None ordered  Labwork: WITHIN 1 WEEK OR 2:  FASTING LIPID  Testing/Procedures: None ordered  Follow-Up: Your physician recommends that you schedule a follow-up appointment in: 2-3 MONTHS WITH DR. Katrinka BlazingSMITH   Any Other Special Instructions Will Be Listed Below (If Applicable).    If you need a refill on your cardiac medications before your next appointment, please call your pharmacy.      Signed, Berton BonJanine  Lajoya Dombek, NP  02/18/2017 4:33 PM    [facility]

## 2017-02-18 NOTE — Patient Instructions (Addendum)
Medication Instructions:  None ordered  Labwork: WITHIN 1 WEEK OR 2:  FASTING LIPID  Testing/Procedures: None ordered  Follow-Up: Your physician recommends that you schedule a follow-up appointment in: 2-3 MONTHS WITH DR. Katrinka BlazingSMITH   Any Other Special Instructions Will Be Listed Below (If Applicable).    If you need a refill on your cardiac medications before your next appointment, please call your pharmacy.

## 2017-02-21 NOTE — Addendum Note (Signed)
Addended by: Lajoyce CornersBOONE, Kamel Haven R on: 02/21/2017 11:48 AM   Modules accepted: Orders

## 2017-02-23 NOTE — Addendum Note (Signed)
Addended by: Daleen BoURRIE, Nayleah Gamel I on: 02/23/2017 01:11 PM   Modules accepted: Orders

## 2017-02-28 ENCOUNTER — Encounter: Payer: Self-pay | Admitting: Family Medicine

## 2017-02-28 ENCOUNTER — Ambulatory Visit (INDEPENDENT_AMBULATORY_CARE_PROVIDER_SITE_OTHER): Payer: 59 | Admitting: Family Medicine

## 2017-02-28 VITALS — BP 160/120 | HR 75 | Wt 347.0 lb

## 2017-02-28 DIAGNOSIS — I10 Essential (primary) hypertension: Secondary | ICD-10-CM | POA: Diagnosis not present

## 2017-02-28 DIAGNOSIS — Z789 Other specified health status: Secondary | ICD-10-CM

## 2017-02-28 DIAGNOSIS — E119 Type 2 diabetes mellitus without complications: Secondary | ICD-10-CM

## 2017-02-28 MED ORDER — LOSARTAN POTASSIUM-HCTZ 100-25 MG PO TABS: 1.0000 | ORAL_TABLET | Freq: Every day | ORAL | 3 refills | Status: DC

## 2017-02-28 MED ORDER — METFORMIN HCL 1000 MG PO TABS: 1000.0000 mg | ORAL_TABLET | Freq: Two times a day (BID) | ORAL | 1 refills | Status: DC

## 2017-02-28 MED ORDER — DILTIAZEM HCL ER COATED BEADS 240 MG PO CP24: 240.0000 mg | ORAL_CAPSULE | Freq: Every day | ORAL | 3 refills | Status: DC

## 2017-02-28 NOTE — Progress Notes (Signed)
   Subjective:    Patient ID: Jesse Duncan, male    DOB: Sep 27, 1985, 32 y.o.   MRN: [id number]  HPI He is here for a recheck.  He has been out of his blood pressure medications for the last several weeks.  He apparently left him on the plane.  He also has not been using insulin for at least the last 2 weeks and states that his blood sugars have been in the 120 260 range.  He has been to the diabetes education classes and he also states that he recently quit smoking.   Review of Systems     Objective:   Physical Exam  Alert and in no distress otherwise not examined        Assessment & Plan:  Current non-smoker  Essential hypertension - Plan: diltiazem (CARDIZEM CD) 240 MG 24 hr capsule, losartan-hydrochlorothiazide (HYZAAR) 100-25 MG tablet  New onset type 2 diabetes mellitus (HCC) - Plan: metFORMIN (GLUCOPHAGE) 1000 MG tablet After discussion with him, I will continue him on the Metformin alone since it seems to be doing a good job.  He has been to the education classes and does plan on making further diet and exercise changes.  Place him back on his blood pressure medications.  Plan to recheck all these in 3 months. Congratulated him on his recently quitting smoking.

## 2017-03-04 ENCOUNTER — Other Ambulatory Visit: Payer: 59 | Admitting: *Deleted

## 2017-03-04 DIAGNOSIS — G4733 Obstructive sleep apnea (adult) (pediatric): Secondary | ICD-10-CM

## 2017-03-04 DIAGNOSIS — I480 Paroxysmal atrial fibrillation: Secondary | ICD-10-CM

## 2017-03-04 DIAGNOSIS — E785 Hyperlipidemia, unspecified: Secondary | ICD-10-CM

## 2017-03-04 DIAGNOSIS — I10 Essential (primary) hypertension: Secondary | ICD-10-CM

## 2017-03-05 LAB — LIPID PANEL
CHOL/HDL RATIO: 6.2 ratio — AB (ref 0.0–5.0)
Cholesterol, Total: 265 mg/dL — ABNORMAL HIGH (ref 100–199)
HDL: 43 mg/dL (ref >39)
LDL Calculated: 190 mg/dL — ABNORMAL HIGH (ref 0–99)
Triglycerides: 158 mg/dL — ABNORMAL HIGH (ref 0–149)
VLDL CHOLESTEROL CAL: 32 mg/dL (ref 5–40)

## 2017-03-07 ENCOUNTER — Telehealth: Payer: Self-pay

## 2017-03-07 MED ORDER — ROSUVASTATIN CALCIUM 40 MG PO TABS: 40.0000 mg | ORAL_TABLET | Freq: Every day | ORAL | 3 refills | Status: DC

## 2017-03-07 NOTE — Progress Notes (Signed)
Spoke with patient about his lab results and gave him the recommendations for medication start (Crestor) and lab work repeat in 3 months.. Patient verbalized understanding.Jesse Duncan..Jesse Duncan

## 2017-03-07 NOTE — Addendum Note (Signed)
Addended by: Sigurd SosAPP, Skylynne Schlechter on: 03/07/2017 11:06 AM   Modules accepted: Orders

## 2017-03-07 NOTE — Telephone Encounter (Signed)
Spoke with patient and informed.

## 2017-03-20 ENCOUNTER — Telehealth: Payer: Self-pay | Admitting: Family Medicine

## 2017-03-20 DIAGNOSIS — I10 Essential (primary) hypertension: Secondary | ICD-10-CM

## 2017-03-20 NOTE — Telephone Encounter (Signed)
P.A. DILTIAZEM

## 2017-03-26 NOTE — Telephone Encounter (Signed)
P.A. Denied, states is plan exclusion.  Will call plan and see what is covered

## 2017-03-30 NOTE — Telephone Encounter (Signed)
Diltiazem is plan exclusion.  Called plan and asked if all strengths are plan exclusions and was informed yes.  And they were unable to tell me any covered alternatives.  What would you like to switch pt to instead of Diltiazem and will see if it is covered?

## 2017-03-31 MED ORDER — DILTIAZEM HCL ER COATED BEADS 240 MG PO CP24: 240.0000 mg | ORAL_CAPSULE | Freq: Every day | ORAL | 3 refills | Status: DC

## 2017-04-04 ENCOUNTER — Encounter: Payer: Self-pay | Admitting: Family Medicine

## 2017-04-04 ENCOUNTER — Telehealth: Payer: Self-pay | Admitting: Family Medicine

## 2017-04-04 NOTE — Telephone Encounter (Signed)
Called and brand Cartia XT not available at [facility] or [facility] & generic plan exclusion I called member services and was advised could do appeal so Appeal letter for Diltiazem typed and faxed to [phone] Provider Appeals dept

## 2017-04-08 NOTE — Telephone Encounter (Signed)
Recv'd denial for appeal for Diltiazem t#  [phone] called case # [id number] and discussed case and problem is pt has plan that only covers preventive medicines and visits.  No sick visits are covered and no medications besides preventative like birth control are covered.  Called pt and left a message

## 2017-04-13 ENCOUNTER — Telehealth: Payer: Self-pay | Admitting: Family Medicine

## 2017-04-13 NOTE — Telephone Encounter (Signed)
Called & informed of insurance issue and he will check with job and see about changing insurnace to policy that covers more than preventative.  [facility] & [facility] fill the Diltiazem XT for cash price of $23.55.  Pt informed

## 2017-05-18 ENCOUNTER — Ambulatory Visit: Payer: 59 | Admitting: Interventional Cardiology

## 2017-05-18 DIAGNOSIS — R0989 Other specified symptoms and signs involving the circulatory and respiratory systems: Secondary | ICD-10-CM

## 2017-05-18 NOTE — Progress Notes (Deleted)
Cardiology Office Note    Date:  05/18/2017   ID:  Jesse Duncan, DOB 08/04/1985, MRN [id number]  PCP:  Ronnald Nian, MD  Cardiologist: Lesleigh Noe, MD   No chief complaint on file.   History of Present Illness:  Jesse Duncan is a 32 y.o. male with obstructive sleep apnea, paroxysmal atrial fibrillation requiring electrical cardioversion, hypertension, obesity, chronic combined systolic and diastolic heart failure.  Has poor compliance with the medical regimen for treating hypertension, heart failure, and sleep apnea.     Past Medical History:  Diagnosis Date  . Allergic rhinitis due to pollen 07/06/2010  . Atrial fibrillation (HCC) 08/15/2015  . Diabetes mellitus without complication (HCC)   . Dyslipidemia   . Hyperlipidemia 05/25/2010  . Hypertension   . Impaired fasting blood sugar 08/15/2015  . Obesity   . OSA (obstructive sleep apnea) 10/20/2015  . Sleep apnea   . Smoker   . Tobacco use disorder 11/14/2013    No past surgical history on file.  Current Medications: Outpatient Medications Prior to Visit  Medication Sig Dispense Refill  . aspirin EC 81 MG tablet Take 1 tablet (81 mg total) by mouth daily. 90 tablet 3  . buPROPion (WELLBUTRIN XL) 150 MG 24 hr tablet Take 1 tablet (150 mg total) by mouth daily. 90 tablet 1  . diltiazem (CARTIA XT) 240 MG 24 hr capsule Take 1 capsule (240 mg total) by mouth daily. 90 capsule 3  . glucose blood test strip Test twice a day. Pt uses one touch verio flex 100 each 2  . losartan-hydrochlorothiazide (HYZAAR) 100-25 MG tablet Take 1 tablet by mouth daily. 90 tablet 3  . metFORMIN (GLUCOPHAGE) 1000 MG tablet Take 1 tablet (1,000 mg total) by mouth 2 (two) times daily with a meal. 180 tablet 1  . ONETOUCH DELICA LANCETS FINE MISC Test twice a day. Pt uses one touch verio flex 100 each 2  . rosuvastatin (CRESTOR) 40 MG tablet Take 1 tablet (40 mg total) by mouth daily. 90 tablet 3   No facility-administered  medications prior to visit.      Allergies:   Patient has no known allergies.   Social History   Socioeconomic History  . Marital status: Single    Spouse name: Not on file  . Number of children: Not on file  . Years of education: Not on file  . Highest education level: Not on file  Occupational History  . Not on file  Social Needs  . Financial resource strain: Not on file  . Food insecurity:    Worry: Not on file    Inability: Not on file  . Transportation needs:    Medical: Not on file    Non-medical: Not on file  Tobacco Use  . Smoking status: Light Tobacco Smoker    Years: 6.00    Types: Cigars, E-cigarettes    Last attempt to quit: 07/14/2015    Years since quitting: 1.8  . Smokeless tobacco: Never Used  . Tobacco comment: sometimes smokes from a hooka pipe  Substance and Sexual Activity  . Alcohol use: Yes    Alcohol/week: 0.6 oz    Types: 1 Standard drinks or equivalent per week    Comment: social   . Drug use: No  . Sexual activity: Not Currently  Lifestyle  . Physical activity:    Days per week: Not on file    Minutes per session: Not on file  . Stress: Not on file  Relationships  . Social connections:    Talks on phone: Not on file    Gets together: Not on file    Attends religious service: Not on file    Active member of club or organization: Not on file    Attends meetings of clubs or organizations: Not on file    Relationship status: Not on file  Other Topics Concern  . 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        Family History:  The patient's ***family history includes Cancer in his maternal grandfather; Healthy in his father; Hypertension in his mother; Stroke in his maternal grandmother.   ROS:   Please see the history of present illness.    ***  All other systems reviewed and are negative.   PHYSICAL EXAM:   VS:  There were no vitals taken for this visit.   GEN: Well nourished,  well developed, in no acute distress  HEENT: normal  Neck: no JVD, carotid bruits, or masses Cardiac: ***RRR; no murmurs, rubs, or gallops,no edema  Respiratory:  clear to auscultation bilaterally, normal work of breathing GI: soft, nontender, nondistended, + BS MS: no deformity or atrophy  Skin: warm and dry, no rash Neuro:  Alert and Oriented x 3, Strength and sensation are intact Psych: euthymic mood, full affect  Wt Readings from Last 3 Encounters:  02/28/17 (!) 347 lb (157.4 kg)  02/18/17 (!) 348 lb (157.9 kg)  01/20/17 (!) 337 lb (152.9 kg)      Studies/Labs Reviewed:   EKG:  EKG  ***  Recent Labs: 08/26/2016: ALT 60; BUN 13; Creat 0.94; Hemoglobin 14.4; Platelets 370; Potassium 4.7; Sodium 134   Lipid Panel    Component Value Date/Time   CHOL 265 (H) 03/04/2017 1548   TRIG 158 (H) 03/04/2017 1548   HDL 43 03/04/2017 1548   CHOLHDL 6.2 (H) 03/04/2017 1548   CHOLHDL 8.9 (H) 08/26/2016 1546   VLDL NOT CALC 08/26/2016 1546   LDLCALC 190 (H) 03/04/2017 1548    Additional studies/ records that were reviewed today include:  ***    ASSESSMENT:    1. Essential hypertension   2. Paroxysmal atrial fibrillation (HCC)   3. Mixed hyperlipidemia   4. OSA (obstructive sleep apnea)   5. Morbid obesity (HCC)      PLAN:  In order of problems listed above:  1. ***    Medication Adjustments/Labs and Tests Ordered: Current medicines are reviewed at length with the patient today.  Concerns regarding medicines are outlined above.  Medication changes, Labs and Tests ordered today are listed in the Patient Instructions below. There are no Patient Instructions on file for this visit.   Signed, Lesleigh Noe, MD  05/18/2017 1:05 PM    [facility] [address] [place], [place], [place]  [address] Phone: [phone]; Fax: [phone]

## 2017-05-19 ENCOUNTER — Encounter: Payer: Self-pay | Admitting: Interventional Cardiology

## 2017-05-20 ENCOUNTER — Emergency Department (HOSPITAL_COMMUNITY): Payer: Self-pay

## 2017-05-20 ENCOUNTER — Encounter (HOSPITAL_COMMUNITY): Payer: Self-pay

## 2017-05-20 ENCOUNTER — Emergency Department
Admission: EM | Admit: 2017-05-20 | Discharge: 2017-05-20 | Disposition: A | Discharge status: Home / Self Care | Payer: Self-pay | Attending: Emergency Medicine | Admitting: Emergency Medicine

## 2017-05-20 DIAGNOSIS — Z880 Allergy status to penicillin: Secondary | ICD-10-CM | POA: Insufficient documentation

## 2017-05-20 DIAGNOSIS — R000 Tachycardia, unspecified: Secondary | ICD-10-CM

## 2017-05-20 DIAGNOSIS — Z9189 Other specified personal risk factors, not elsewhere classified: Secondary | ICD-10-CM

## 2017-05-20 DIAGNOSIS — Z1159 Encounter for screening for other viral diseases: Secondary | ICD-10-CM

## 2017-05-20 DIAGNOSIS — R202 Paresthesia of skin: Secondary | ICD-10-CM

## 2017-05-20 DIAGNOSIS — R45851 Suicidal ideations: Secondary | ICD-10-CM | POA: Insufficient documentation

## 2017-05-20 DIAGNOSIS — F1510 Other stimulant abuse, uncomplicated: Secondary | ICD-10-CM | POA: Insufficient documentation

## 2017-05-20 DIAGNOSIS — F17210 Nicotine dependence, cigarettes, uncomplicated: Secondary | ICD-10-CM | POA: Insufficient documentation

## 2017-05-20 LAB — URINALYSIS, MACROSCOPIC
BILIRUBIN: NEGATIVE mg/dL
BILIRUBIN: NEGATIVE mg/dL
BLOOD: NEGATIVE mg/dL
COLOR: NORMAL
COLOR: NORMAL
GLUCOSE: NEGATIVE mg/dL
KETONES: 20 mg/dL — AB
LEUKOCYTES: NEGATIVE WBCs/uL
NITRITE: NEGATIVE
PH: 5 (ref 5.0–8.0)
PROTEIN: 30 mg/dL — AB
SPECIFIC GRAVITY: >1.03 — ABNORMAL HIGH (ref 1.005–1.030)
UROBILINOGEN: 2 mg/dL — AB

## 2017-05-20 LAB — DRUG SCREEN, NO CONFIRMATION, URINE
AMPHETAMINES URINE: POSITIVE — AB
BARBITURATES URINE: NEGATIVE
BENZODIAZEPINES URINE: NEGATIVE
BUPRENORPHINE URINE: NEGATIVE
CANNABINOIDS URINE: POSITIVE — AB
CANNABINOIDS URINE: POSITIVE — AB
COCAINE METABOLITES URINE: POSITIVE — AB
CREATININE RANDOM URINE: 327 mg/dL
ECSTASY/MDMA URINE: NEGATIVE
METHADONE URINE: NEGATIVE
OPIATES URINE (LOW CUTOFF): NEGATIVE
OXIDANT-ADULTERATION: NEGATIVE
OXYCODONE URINE: NEGATIVE
PH-ADULTERATION: 5.4 (ref 4.5–9.0)
SPECIFIC GRAVITY-ADULTERATION: 1.022 g/mL (ref 1.005–1.030)

## 2017-05-20 LAB — CBC WITH DIFF
BASOPHIL #: <0.1 x10ˆ3/uL (ref <=0.20)
BASOPHIL %: 0 %
EOSINOPHIL #: <0.1 x10ˆ3/uL (ref <=0.50)
EOSINOPHIL %: 1 %
HCT: 44.7 % (ref 38.9–52.0)
HGB: 15.2 g/dL (ref 13.4–17.5)
IMMATURE GRANULOCYTE #: <0.1 x10ˆ3/uL (ref <0.10)
IMMATURE GRANULOCYTE %: 0 % (ref 0–1)
LYMPHOCYTE #: 1.94 10*3/uL (ref 1.00–4.80)
LYMPHOCYTE #: 1.94 x10ˆ3/uL (ref 1.00–4.80)
LYMPHOCYTE %: 20 %
MCH: 28.7 pg (ref 26.0–32.0)
MCHC: 34 g/dL (ref 31.0–35.5)
MCHC: 34 g/dL (ref 31.0–35.5)
MCV: 84.3 fL (ref 78.0–100.0)
MONOCYTE #: 0.94 x10ˆ3/uL (ref 0.20–1.10)
MONOCYTE %: 10 %
MPV: 9.5 fL (ref 8.7–12.5)
NEUTROPHIL #: 6.92 x10ˆ3/uL (ref 1.50–7.70)
NEUTROPHIL %: 69 %
PLATELETS: 298 x10ˆ3/uL (ref 150–400)
RBC: 5.3 10*6/uL (ref 4.50–6.10)
RDW-CV: 12.9 % (ref 11.5–15.5)
WBC: 9.9 x10ˆ3/uL (ref 3.7–11.0)

## 2017-05-20 LAB — ECG 12-LEAD
Atrial Rate: 105 {beats}/min
Calculated P Axis: 73 degrees
Calculated R Axis: 60 degrees
Calculated T Axis: 61 degrees
PR Interval: 124 ms
QRS Duration: 86 ms
QT Interval: 352 ms
QTC Calculation: 465 ms
Ventricular rate: 105 {beats}/min

## 2017-05-20 LAB — URINALYSIS, MICROSCOPIC
HYALINE CASTS: 2 /lpf (ref <4.0)
RBCS: 1 /hpf (ref <6.0)
WBCS: 2 /hpf (ref <4.0)

## 2017-05-20 LAB — BASIC METABOLIC PANEL
ANION GAP: 13 mmol/L (ref 4–13)
BUN/CREA RATIO: 22 (ref 6–22)
BUN: 22 mg/dL (ref 8–25)
CALCIUM: 10.3 mg/dL — ABNORMAL HIGH (ref 8.5–10.2)
CHLORIDE: 103 mmol/L (ref 96–111)
CO2 TOTAL: 22 mmol/L (ref 22–32)
CREATININE: 0.98 mg/dL (ref 0.62–1.27)
ESTIMATED GFR: >59 mL/min/1.73mˆ2 (ref >59)
GLUCOSE: 95 mg/dL (ref 65–139)
POTASSIUM: 3.4 mmol/L — ABNORMAL LOW (ref 3.5–5.1)
SODIUM: 138 mmol/L (ref 136–145)

## 2017-05-20 LAB — TROPONIN-I (FOR ED ONLY): TROPONIN I: <7 ng/L (ref 0–30)

## 2017-05-20 LAB — ETHANOL, SERUM: ETHANOL: NOT DETECTED

## 2017-05-20 LAB — THYROID STIMULATING HORMONE WITH FREE T4 REFLEX: TSH: 0.647 u[IU]/mL (ref 0.350–5.000)

## 2017-05-20 LAB — ETHANOL, SERUM/PLASMA: ETHANOL: <10 mg/dL (ref <10)

## 2017-05-20 MED ORDER — SODIUM CHLORIDE 0.9 % IV BOLUS
1000.0000 mL | INJECTION | Status: AC
Start: 2017-05-20 — End: 2017-05-20
  Administered 2017-05-20: 1000 mL via INTRAVENOUS
  Administered 2017-05-20: 0 mL via INTRAVENOUS

## 2017-05-20 MED ORDER — HYDROXYZINE PAMOATE 25 MG CAPSULE
100.00 mg | ORAL_CAPSULE | ORAL | Status: AC
Start: 2017-05-20 — End: 2017-05-20
  Administered 2017-05-20: 100 mg via ORAL
  Filled 2017-05-20: qty 4

## 2017-05-20 MED ORDER — HYDROXYZINE HCL 50 MG/ML INTRAMUSCULAR SOLUTION
100.00 mg | INTRAMUSCULAR | Status: DC
Start: 2017-05-20 — End: 2017-05-20
  Filled 2017-05-20: qty 2

## 2017-05-20 NOTE — ED Attending Note (Signed)
I was physically present and directly supervised this patient's care. Patient was seen and examined. The midlevel's/resident's history and exam were reviewed. Key elements in addition to and/or correction of that documentation are as follows:    Triage:  Med G Evaluation (pt arrives with complaint "of worms crawling out of his ears for past 3 days"   pt denies SI /HI )    Filed Vitals:    05/20/17 0657   BP: (!) 160/100   Pulse: (!) 136   Resp: 20   Temp: 36.4 C (97.5 F)   SpO2: 97%         [name] is a 32 y.o. male p/w concern for lice.  Patient is concerned that he has a cutaneous infection of some type.  Did take over-the-counter lice treatment then shaved his head and beard.  Patient also noticed some swelling at the site of 1 of his EKG rings which he has had for 7 years.  Patient took out the gauge there was a small amount of drainage he then saw some abnormality cutaneous tissue versus worm like structures.  Is concerned about that as well.  Patient uses cocaine on the weekends.  Works as a Chief Operating Officer down a job has no auditory visual hallucinations previous to this no suicidal homicidal ideation.  No other complaints    Pertinent Exam  Agree with resident exam    ED Course  Labs Reviewed   CBC/DIFF    Narrative:     The following orders were created for panel order CBC/DIFF.  Procedure                               Abnormality         Status                     ---------                               -----------         ------                     CBC WITH HYQM[[id number]]                                    Final result                 Please view results for these tests on the individual orders.   CBC WITH DIFF   BASIC METABOLIC PANEL   ETHANOL, SERUM   THYROID STIMULATING HORMONE WITH FREE T4 REFLEX   TROPONIN-I (FOR ED ONLY)   DRUG SCREEN, NO CONFIRMATION, URINE   URINALYSIS, MACROSCOPIC AND MICROSCOPIC W/CULTURE REFLEX    Narrative:     The following orders were created for panel order  URINALYSIS, MACROSCOPIC AND MICROSCOPIC W/CULTURE REFLEX.  Procedure                               Abnormality         Status                     ---------                               -----------         ------  URINALYSIS, MACROSCOPIC[[id number]]                          In process                 URINALYSIS, MICROSCOPIC[[id number]]                          In process                   Please view results for these tests on the individual orders.   URINALYSIS, MACROSCOPIC   URINALYSIS, MICROSCOPIC     No orders to display       Results reviewed  Records Reviewed  Sinus tachycardia rate 105  Labs and imaging reviewed    Dispo:  Course Update:    Patient meth positive.  No signs of infection.  Discussed with patient.  Notes this was likely accidental ingestion.  Does not want detox      Disposition: Discharged    Follow up:   [facility] Emergency Department  [address]  [place] [address]  [phone]  Schedule an appointment as soon as possible for a visit   As needed    PCP    In 2 days  As needed      Clinical Impression:     Encounter Diagnosis   Name Primary?   . Suicidal ideation Yes       Future Appointments scheduled in [place]:   No future appointments.    Denton Lank, MD 05/20/2017, 10:26  Arcola Jansky, MD  Attending Physician  Emergency Medicine

## 2017-05-20 NOTE — ED Nurses Note (Signed)
PIV removed. Pt given d/c paperwork. No questions at this time

## 2017-05-20 NOTE — ED Provider Notes (Signed)
[facility] Emergency Department    CC:  Formication  HPI:  [name] is a 32 y.o. male who presents with concerns that he saw worms coming out of his ear lobes this morning.  Two days ago he was concerned he had lice and he shaved his entire head last night.  He removed his earrings and saw what appeared to be worms.  He is extremely concerned about this.  He denies any other physical symptoms besides itching of the head.    ROS:  Constitutional: No fever, chills, or weakness  Skin:  Per HPI  HENT: No complaints  Cardio: No chest pain, palpitations or leg swelling   Respiratory: No cough  SOB  GI:  No nausea, vomiting, diarrhea, constipation  GU:  No dysuria, hematuria, polyuria  MSK: No joint or back pain  Neuro: No loss of sensation, confusion, focal deficits, numbness, tingling  Psychiatric: No mood changes  All other systems reviewed and are negative.    Medications:  Medications Prior to Admission     None          Allergies:  Allergies   Allergen Reactions   . Penicillins      Allergies reviewed with patient    History reviewed. No pertinent past medical history.      History reviewed with patient    Past Surgical History:   Procedure Laterality Date   . HX HAND SURGERY             Social History     Socioeconomic History   . Marital status: Single     Spouse name: Not on file   . Number of children: Not on file   . Years of education: Not on file   . Highest education level: Not on file   Occupational History   . Not on file   Social Needs   . Financial resource strain: Not on file   . Food insecurity:     Worry: Not on file     Inability: Not on file   . Transportation needs:     Medical: Not on file     Non-medical: Not on file   Tobacco Use   . Smoking status: Current Every Day Smoker     Packs/day: 1.00     Types: Cigarettes   . Smokeless tobacco: Never Used   Substance and Sexual Activity   . Alcohol use: No     Comment: denies   . Drug use: Yes     Types: Cocaine   . Sexual activity: Not  on file   Lifestyle   . Physical activity:     Days per week: Not on file     Minutes per session: Not on file   . Stress: Not on file   Relationships   . Social connections:     Talks on phone: Not on file     Gets together: Not on file     Attends religious service: Not on file     Active member of club or organization: Not on file     Attends meetings of clubs or organizations: Not on file     Relationship status: Not on file   . Intimate partner violence:     Fear of current or ex partner: Not on file     Emotionally abused: Not on file     Physically abused: Not on file     Forced sexual activity: Not on file  Other Topics Concern   . Abuse/Domestic Violence Not Asked   . Caffeine Concern Not Asked   . Calcium intake adequate Not Asked   . Computer Use Not Asked   . Drives Not Asked   . Exercise Concern Not Asked   . Helmet Use Not Asked   . Seat Belt Not Asked   . Special Diet Not Asked   . Sunscreen used Not Asked   . Uses Cane Not Asked   . Uses walker Not Asked   . Uses wheelchair Not Asked   . Right hand dominant Not Asked   . Left hand dominant Not Asked   . Ambidextrous Not Asked   . Shift Work Not Asked   . Unusual Sleep-Wake Schedule Not Asked   Social History Narrative   . Not on file       Family Medical History:     None              Physical Exam:  All nurse's notes reviewed.  ED Triage Vitals [05/20/17 0657]   Enc Vitals Group      BP (Non-Invasive) (!) 160/100      Heart Rate (!) 136      Respiratory Rate 20      Temperature 36.4 C (97.5 F)      Temp src       SpO2 97 %      Weight 76.2 kg (167 lb 15.9 oz)      Height 1.753 m ( )      Head Circumference       Peak Flow       Pain Score       Pain Loc       Pain Edu?       Excl. in GC?        Constitutional: NAD.   HENT:   Head: Normocephalic and atraumatic.   Mouth/Throat: Oropharynx is clear and moist.   Eyes: PERRL, EOMI, conjunctivae without discharge bilaterally  Ears:  Recently remove large diameter earrings, no obvious bleeding or  erythema.  No visualized parasites.  Neck: Trachea midline.   Cardiovascular: RRR, No murmurs, rubs or gallops.   Pulmonary/Chest: BS equal bilaterally, good air movement. No respiratory distress. No wheezes, rales or chest tenderness.   Abdominal: BS +. Abdomen soft, no tenderness, rebound or guarding.               Musculoskeletal: No edema, tenderness or deformity.  Skin: warm and dry. No rash, erythema, pallor or cyanosis  Psychiatric: normal mood and affect. Behavior is normal.   Neurological: Alert&Ox3. Grossly intact.     Labs:  Labs Reviewed   BASIC METABOLIC PANEL - Abnormal; Notable for the following components:       Result Value    POTASSIUM 3.4 (*)     CALCIUM 10.3 (*)     All other components within normal limits   DRUG SCREEN, NO CONFIRMATION, URINE - Abnormal; Notable for the following components:    CANNABINOIDS URINE Positive (*)     COCAINE METABOLITES URINE Positive (*)     AMPHETAMINES URINE Positive (*)     All other components within normal limits   URINALYSIS, MACROSCOPIC - Abnormal; Notable for the following components:    SPECIFIC GRAVITY >1.030 (*)     PROTEIN 30  (*)     UROBILINOGEN 2.0  (*)     KETONES 20  (*)     All other components within  normal limits   URINALYSIS, MICROSCOPIC - Abnormal; Notable for the following components:    MUCOUS Heavy (*)     All other components within normal limits   THYROID STIMULATING HORMONE WITH FREE T4 REFLEX - Normal   TROPONIN-I (FOR ED ONLY) - Normal   CBC/DIFF    Narrative:     The following orders were created for panel order CBC/DIFF.  Procedure                               Abnormality         Status                     ---------                               -----------         ------                     CBC WITH GEXB[[id number]]                                    Final result                 Please view results for these tests on the individual orders.   ETHANOL, SERUM   URINALYSIS, MACROSCOPIC AND MICROSCOPIC W/CULTURE REFLEX    Narrative:      The following orders were created for panel order URINALYSIS, MACROSCOPIC AND MICROSCOPIC W/CULTURE REFLEX.  Procedure                               Abnormality         Status                     ---------                               -----------         ------                     URINALYSIS, MACROSCOPIC[[id number]]      Abnormal            Final result               URINALYSIS, MICROSCOPIC[[id number]]      Abnormal            Final result                 Please view results for these tests on the individual orders.   CBC WITH DIFF         Imaging:  None Indicated        Therapy/Procedures/Course/MDM:  Patient presented with concerns that he had lice and or worms coming out of his ear lobes.  Patient initially denied methamphetamine use but then acknowledge that he did not know if his cocaine came from its usual source.  I explained to him in great length that I felt his symptoms were likely formication and that he did not have any active parasitic infection.  Eventually he agreed.  He did not wish for any help  with substance abuse.  Consults:  None  Impression:  Formication and methamphetamine abuse  Disposition:      Following the above history, physical exam, and studies, the patient was deemed stable and suitable for discharge and he will follow up with PCP. Patient was advised to return to the ED with any new, concerning or worsening symptoms and follow up as directed. The patient verbalized understanding of all instructions and had no further questions or concerns.         This note was partially generated using MModal Fluency Direct system, and there may be some incorrect words, spellings, and punctuation that were not noted in checking the note before saving.  Edmonia Lynch, MD 05/23/2017, 05:08  [facility] Emergency [facility]        I saw and examined the patient.  I directly supervised the patient's care.  I discussed the patient's care with the resident/midlevel and I agree with the findings,  diagnosis, and plan as documented in the primary note.  Any exceptions, additions, or corrections are noted below.    Denton Lank, MD 05/26/2017, 12:53  Lorin Picket Delsa Bern, MD  Attending Physician  Emergency Medicine

## 2017-05-20 NOTE — Nurses Notes (Addendum)
Pt arrived to ED with complaints of worms crawling out of where his ear gauges were. Symptoms began a few days ago so he tried lice shampoo, shaved his head and beard. Pt denies drug use other than cocaine and some alcohol on the weekends. Pt denies SI/HI

## 2017-05-20 NOTE — Discharge Instructions (Signed)
Follow up with PCP, return with any additional symptoms.

## 2017-06-10 ENCOUNTER — Other Ambulatory Visit: Payer: 59

## 2017-06-24 ENCOUNTER — Ambulatory Visit: Payer: 59 | Admitting: Family Medicine

## 2017-06-24 ENCOUNTER — Telehealth: Payer: Self-pay | Admitting: Internal Medicine

## 2017-06-24 DIAGNOSIS — I10 Essential (primary) hypertension: Secondary | ICD-10-CM

## 2017-06-24 DIAGNOSIS — E119 Type 2 diabetes mellitus without complications: Secondary | ICD-10-CM

## 2017-06-24 MED ORDER — DILTIAZEM HCL ER COATED BEADS 240 MG PO CP24: 240.0000 mg | ORAL_CAPSULE | Freq: Every day | ORAL | 0 refills | Status: DC

## 2017-06-24 MED ORDER — LOSARTAN POTASSIUM-HCTZ 100-25 MG PO TABS: 1.0000 | ORAL_TABLET | Freq: Every day | ORAL | 0 refills | Status: DC

## 2017-06-24 MED ORDER — METFORMIN HCL 1000 MG PO TABS: 1000.0000 mg | ORAL_TABLET | Freq: Two times a day (BID) | ORAL | 0 refills | Status: DC

## 2017-06-24 NOTE — Telephone Encounter (Signed)
Pt called and states he is out of town and could not make his DM appt. He rescheduled for Friday July 26. He can only come in on fridays. He asked for a refill on meds until his appt

## 2017-07-29 ENCOUNTER — Ambulatory Visit: Payer: 59 | Admitting: Family Medicine

## 2017-08-04 ENCOUNTER — Encounter: Payer: Self-pay | Admitting: Family Medicine

## 2017-12-23 DIAGNOSIS — J111 Influenza due to unidentified influenza virus with other respiratory manifestations: Secondary | ICD-10-CM | POA: Diagnosis not present

## 2018-02-08 ENCOUNTER — Telehealth: Payer: Self-pay | Admitting: Family Medicine

## 2018-02-08 NOTE — Telephone Encounter (Signed)
Dismissal letter in guarantor snapshot  °

## 2018-03-27 ENCOUNTER — Inpatient Hospital Stay (HOSPITAL_COMMUNITY)
Admission: EM | Admit: 2018-03-27 | Discharge: 2018-03-27 | Disposition: A | Discharge status: Home / Self Care | Payer: Medicaid Other | Source: Other Acute Inpatient Hospital

## 2018-03-27 DIAGNOSIS — S60221A Contusion of right hand, initial encounter: Secondary | ICD-10-CM

## 2018-03-27 DIAGNOSIS — X58XXXA Exposure to other specified factors, initial encounter: Secondary | ICD-10-CM

## 2018-03-27 DIAGNOSIS — R600 Localized edema: Secondary | ICD-10-CM

## 2018-03-27 DIAGNOSIS — H9202 Otalgia, left ear: Secondary | ICD-10-CM

## 2018-05-18 ENCOUNTER — Inpatient Hospital Stay (HOSPITAL_COMMUNITY)
Admission: EM | Admit: 2018-05-18 | Discharge: 2018-05-18 | Disposition: A | Discharge status: Home / Self Care | Payer: Self-pay | Source: Other Acute Inpatient Hospital

## 2018-05-18 ENCOUNTER — Encounter (INDEPENDENT_AMBULATORY_CARE_PROVIDER_SITE_OTHER): Payer: Self-pay | Admitting: Family

## 2018-05-18 DIAGNOSIS — K047 Periapical abscess without sinus: Secondary | ICD-10-CM

## 2018-05-19 ENCOUNTER — Encounter (INDEPENDENT_AMBULATORY_CARE_PROVIDER_SITE_OTHER): Payer: Self-pay | Admitting: Physician Assistant

## 2018-05-19 ENCOUNTER — Other Ambulatory Visit: Payer: Self-pay

## 2018-05-19 ENCOUNTER — Ambulatory Visit (INDEPENDENT_AMBULATORY_CARE_PROVIDER_SITE_OTHER): Payer: Self-pay | Admitting: Physician Assistant

## 2018-05-19 VITALS — BP 138/80 | HR 101 | Temp 97.9°F | Resp 16 | Ht 69.0 in | Wt 187.0 lb

## 2018-05-19 DIAGNOSIS — K1379 Other lesions of oral mucosa: Secondary | ICD-10-CM

## 2018-05-19 DIAGNOSIS — K0381 Cracked tooth: Secondary | ICD-10-CM

## 2018-05-19 DIAGNOSIS — K122 Cellulitis and abscess of mouth: Secondary | ICD-10-CM

## 2018-05-19 DIAGNOSIS — K089 Disorder of teeth and supporting structures, unspecified: Secondary | ICD-10-CM

## 2018-05-19 MED ORDER — ACETAMINOPHEN 300 MG-CODEINE 30 MG TABLET
1.0000 | ORAL_TABLET | ORAL | 0 refills | Status: AC | PRN
Start: 2018-05-19 — End: 2018-05-26

## 2018-05-19 NOTE — Progress Notes (Signed)
[facility]  [address] [place] [address]       Name: [name] MRN:  [id number]   Date: 05/19/2018 Age: 33 y.o.      Chief Complaint   Patient presents with   . Toothache      Subjective:    [name] is an 32 y.o. male who presents for a new patient acute visit with c/o tooth pain.     Pt has not yet presented to our office to establish care. Pt was not previously following with a  PCP. Pt has Hx of depression for which he was seeing [facility]. Pt states he is not currentkly medicated at this time. No other medical history reported. Pt does have family Hx of lung cancer in his mother and unknown gynecological cancer in his sister. Pt reports father has HTN. No other know family Hx to report.     Pt notes he is here today for severe oral pain. Pt notes Sx started 3 days ago. Pt notes he had a broken right upper tooth for about 2 years that was not bothering him until a few days ago. Pt notes he couldn't take the pain yesterday so he went to [facility] ED. Pt was given ibuprofen 600mg  and clindamycin. Pt states he started the clindamyicn yesterday. Pt was prescribed 150mg  capsules to take 2 capsules q 6 hours for 10 days. Pt notes he is scheduled to see the dentist, Dr. Maxcine Ham, on Wed. Pt states pain is 10/10 in roof of mouth. Denies discharge or drainage from gums. Pt last took ibuprofen less tham 30 minutes before being seen which is not helping with his pain.  Patient states pain is worsening today and roof of mouth is more swollen.  Patient notes it is painful to eat but is not having any trouble breathing or swallowing. Denies fevers, NVDC.       Allergies   Allergen Reactions   . Penicillins      clindamycin (CLEOCIN) 150 mg Oral Capsule, Take 150 mg by mouth Four times a day  Ibuprofen (MOTRIN) 600 mg Oral Tablet, Take 600 mg by mouth Four times a day as needed for Pain    No facility-administered medications prior to visit.     Past Medical  History:   Diagnosis Date   . Depression         Past Surgical History:   Procedure Laterality Date   . HX HAND SURGERY         Social History     Tobacco Use   . Smoking status: Current Every Day Smoker     Packs/day: 1.00     Types: Cigarettes   . Smokeless tobacco: Never Used   Substance Use Topics   . Alcohol use: No     Comment: denies   . Drug use: Yes     Types: Cocaine        ROS:  See HPI above  Constitutional: Denies fevers, chills or fatigue.   Eyes: Denies vision changes.  ENT:  Admits oral pain, poor dentition, cracked tooth, swelling. Denies nasal congestion, sore throat, ear pain   Respiratory: Denies Cough, SOB, or wheezing.  Cardiovascular: Denies chest pain, palpitations or edema.   Gastrointestinal: Denies any abd pain, N/V/D/C  Genitourinary: Denies dysuria or hematuria  Musculoskeletal: Denies joint pain or weakness.  Neurologic: Denies numbness, tingling or gait changes.   Psych: Denies memory loss, confusion, sleep pattern changes.  Integumentary : Denies rashes         Objective:  Physical Exam:  BP 138/80 (Site: Left, Patient Position: Sitting, Cuff Size: Adult)   Pulse (!) 101   Temp 36.6 C (97.9 F) (Tympanic)   Resp 16   Ht 1.753 m (5\' 9" )   Wt 84.8 kg (187 lb)   SpO2 96%   BMI 27.62 kg/m       General: Patient is alert and oriented and in no distress.  Patient appears to be in acute pain.  Head: normocephalic, atraumatic  ENT: Oral musoca is moist. There is moderate swelling noted on right hard palate approximately dime sized in diameter. Area is extremely tender and patient will not tolerate palpation of area. Dentition is poor throughout. There is a right upper cracked tooth noted close to the swollen palate. There is no active discharge from the swollen region. The pharynx is free of erythema or swelling.  No cervical lymphadenopathy noted. Patient often hold his right face and grimaces in pain during exam.   Eye: EOM intact, normal conjunctiva, PERRLA  Abdomen: Soft and non  tender on palpation,  +BS in all four quadrants, no organomegaly  Respiratory: Lung fields CTAB, equal chest rise and expansion   Heart: Regular rate and rhythm, no edema noted  Neurologic: Alert and oriented.   Muscluoskeltal: Gait is grossly normal. Pt ambulates without the aid of assistive devices.   Psychiatric: Patient is cooperative and in no acute distress.      Assessment and Plan:  Orders Placed This Encounter   . acetaminophen-codeine (TYLENOL #3) 300-30 mg Oral Tablet       ICD-10-CM    1. Abscess of oral tissue K12.2      Patient has an abscess on right roof of mouth likely due to nearby cracked tooth. Pt educated if sx have worsened since yesterday and do not begin to improve today he needs to go back to ED for possible surgical drainage and IV antibiotics. Patinet is to continue with oral clindamycin as prescribed by ER.  Short script of tylenol #3 given for acute pain as PDMP was negative. Pt educated no OTC tylenol can be taken with this. Pt states understanding. Keep follow up with dentist as scheduled for tooth repair.    2. Acute oral pain K13.79    As above   3. Cracked tooth K03.81    As above   4. Poor dentition K08.9    As above       Tylenol with codiene sent to pharmacy for acute pain. Pt PDMP returned negative results. Pt educated no OTC tylenol while taking this medication. Pt isntructed to continue clindamycin as prescribed by ED. Pt educaetd if no Sx improvement he needs to return back to the ED tonight. Pt educated he may require surgical drainage and possible IV ATB for Tx if not improving or declining. Encourage rest and good fluid intake. Educated if Sx persist or worsen to call office or report to ER. Pt states understanding of discussed plan. Pt does not wish to return to ED at this time but states he will monitor Sx at home today and if no improvement or if Sx worsen pt agrees to proceed to the ER immediately tonight.  Pt to schedule appointment at his convenience to establish  chronic care with our office.      Patient seen by: Lorenda Ishiharahantel Lach, PA-C

## 2018-05-19 NOTE — Nursing Note (Signed)
Tooth broke off; years ago  Part of tooth embedded in roof of mouth  Swollen and painful roof of mouth     Seen at [facility] ER yesterday  TX:  Ibuprofen   cyclamycin (he believes it is 150mg )    Dentist appointment next Wednesday

## 2018-05-24 ENCOUNTER — Telehealth (INDEPENDENT_AMBULATORY_CARE_PROVIDER_SITE_OTHER): Payer: Self-pay | Admitting: Physician Assistant

## 2018-05-24 NOTE — Telephone Encounter (Signed)
Patient called in and reports he was in the office to see Chantel on 05/19/18 and during the visit her computer shut off. He states they were discussing his medications that he has been without from [facility]. He states that she was going to call [facility] to get these medication for him. Please advise.     Medications are Vistaril, Seroquel, and Cogentin.     Patient # [phone].

## 2018-05-24 NOTE — Telephone Encounter (Signed)
I believe patient misunderstood as I had only stepped out to send his controlled substance, we did not discuss any other medication to be received from [place]. Pt reported he used to see [facility] for depression but stated he is not currentlty medicated at this time. Patient is new to our office and was instructed to schedule and appointment to establish chronic care with [place] as he was just being seen for an acute issue. Please schedule him if he has not yet scheduled this appointment. Generally we would recommend he continues to follow with psych for medication management. Does pt need a referral to go back to see psych?

## 2018-05-24 NOTE — Telephone Encounter (Signed)
I spoke with patient's father, richard. He tells me he plans to drive to [facility] today to discuss these medications with them and handle the refills. He will follow up with Tanya in office on Friday as scheduled.

## 2018-05-25 ENCOUNTER — Emergency Department (HOSPITAL_COMMUNITY): Payer: Medicaid Other

## 2018-05-25 ENCOUNTER — Other Ambulatory Visit: Payer: Self-pay

## 2018-05-25 ENCOUNTER — Emergency Department
Admission: EM | Admit: 2018-05-25 | Discharge: 2018-05-25 | Disposition: A | Discharge status: Home / Self Care | Payer: 59 | Attending: Emergency Medicine | Admitting: Emergency Medicine

## 2018-05-25 DIAGNOSIS — F17210 Nicotine dependence, cigarettes, uncomplicated: Secondary | ICD-10-CM | POA: Insufficient documentation

## 2018-05-25 DIAGNOSIS — T50901A Poisoning by unspecified drugs, medicaments and biological substances, accidental (unintentional), initial encounter: Secondary | ICD-10-CM | POA: Insufficient documentation

## 2018-05-25 DIAGNOSIS — Z792 Long term (current) use of antibiotics: Secondary | ICD-10-CM | POA: Insufficient documentation

## 2018-05-25 DIAGNOSIS — Z880 Allergy status to penicillin: Secondary | ICD-10-CM | POA: Insufficient documentation

## 2018-05-25 MED ORDER — ONDANSETRON HCL (PF) 4 MG/2 ML INJECTION SOLUTION
4.00 mg | INTRAMUSCULAR | Status: AC
Start: 2018-05-25 — End: 2018-05-25

## 2018-05-25 MED ORDER — NALOXONE 4 MG/ACTUATION NASAL SPRAY
1.00 | NASAL | 0 refills | Status: AC | PRN
Start: 2018-05-25 — End: ?

## 2018-05-25 MED ORDER — ONDANSETRON HCL (PF) 4 MG/2 ML INJECTION SOLUTION
INTRAMUSCULAR | Status: AC
Start: 2018-05-25 — End: 2018-05-25
  Administered 2018-05-25: 4 mg via INTRAVENOUS
  Filled 2018-05-25: qty 2

## 2018-05-25 NOTE — ED Nurses Note (Signed)
Vomitingx1. 4mg  Zofran given.

## 2018-05-25 NOTE — ED Nurses Note (Signed)
Patient discharged home with self.  AVS reviewed with patient/care giver.  A written copy of the AVS and discharge instructions was given to the patient/care giver.  Questions sufficiently answered as needed.  Patient/care giver encouraged to follow up with PCP as indicated.  In the event of an emergency, patient/care giver instructed to call 911 or go to the nearest emergency room.     IV removed.

## 2018-05-25 NOTE — ED Attending Note (Signed)
ED ATTENDING NOTE:    I was physically present and directly supervised this patients care. Patient seen and examined with the resident/MLP, Dr. Andrey Campanile, and history and exam reviewed. Key elements in addition to and/or correction of that documentation are as follows:    HPI:  [name] is a 33 y.o. male who presents after overdose.  Patient took and unknown amount of unknown substance which he got from "a guy."  He does not remember much of the incident.  EMS was called and administered 6 mg of Narcan.  Patient responded appropriately.    On arrival patient feels jittery but otherwise denies any complaints.    Further historical details can be found in the resident/MLP note.    PERTINENT PHYSICAL EXAM:  ED Triage Vitals [05/25/18 0332]   BP (Non-Invasive) (!) 160/106   Heart Rate (!) 107   Respiratory Rate 20   Temperature 36.5 C (97.7 F)   SpO2 99 %   Weight 83.9 kg (185 lb)   Height          I have seen and physically examined the patient.  I agree with the physical exam as documented in Dr. [name] note.    LABS:  None    IMAGING:  None    ECG:  None      ED COURSE:  Patient remained stable while in the ED.      Patient presented after overdose.  Patient was awake alert on arrival.  He was observed in the ED.  Patient did not have any return of symptoms.  Substance abuse treatment was offered but patient declined.  Patient was discharged home.    DISPO: Discharged    CLINICAL IMPRESSION:   Encounter Diagnosis   Name Primary?   Marland Kitchen Accidental drug overdose, initial encounter Yes       Chad Cordial, MD  05/25/2018, 05:39

## 2018-05-25 NOTE — Care Management Notes (Signed)
[facility]  Care Management Note    Patient Name: [name]  Date of Birth: 07/02/1985  Sex: male  Date/Time of Admission: 05/25/2018  3:30 AM  Room/Bed: OTF/OTF  Payor: DEPT OF PUBLIC WELFARE ([place]) / Plan: [place] MEDICAID / Product Type: Medicaid Out of State /    LOS: 0 days   Primary Care Providers:  Pcp, No (General)    Admitting Diagnosis:  No admission diagnoses are documented for this encounter.    Assessment:   MSW was consulted by the ED concierge and informed that patient was discharged last night and has been unable to find transportation. Per concierge patient would like a bus pass to bring him downtown. MSW reviewed patient's chart and provided patient a bus pass. Will follow.       The patient will continue to be evaluated for developing discharge needs.     Case Manager: Lavonia Dana, [place]  Phone: [phone]

## 2018-05-25 NOTE — ED Provider Notes (Signed)
Emergency Department  Provider Note    Name: [name]  Age and Gender: 33 y.o. male  Date of Birth: Aug 17, 1985  Date of Service: 05/25/2018   MRN: [id number]  PCP: No Pcp  Attending: Dara Hoyer    CC:  Chief Complaint   Patient presents with   . Drug Overdose     Snorted unknown substance. Found unresponsive. EMS administered 6mg  Narcan IN. CAOx4 upon arrival,       Clinical Impression:     Encounter Diagnosis   Name Primary?   Marland Kitchen Accidental drug overdose, initial encounter Yes        MDM/Course:  [name] is a 33 y.o. male who presented with drug overdose.    ED Course:  -patient arrived with EMS was noted to be vitally stable awake and alert and answering questions appropriately.  -patient was monitored in the emergency department for a time had no significant respiratory depression.  On reassessment patient has not required any further Narcan and appears well  -discussed with patient and offered him substance abuse treatment/referral he declines at this time.  -discussed with patient warning signs for worsening respiratory depression advised that someone should monitor him tonight.  Advised him to seek help for his substance abuse treatment if he so desires  -patient was also given a prescription for Narcan and was instructed verbally on its use  -Patient was agreeable and comfortable with the plan of discharge, all questions were answered and the patient was discharged from the Emergency Department in stable condition     Medications given:  Medications   ondansetron (ZOFRAN) 2 mg/mL injection (4 mg Intravenous Given 05/25/18 0410)       Following the below history, physical exam, and studies, the patient was deemed stable and suitable for discharge. The patient was advised to return to the ED for any new or worsening symptoms. Discharge medications, and follow-up instructions were discussed with the patient in detail, who verbalizes understanding. The patient is in agreement and is comfortable  with the plan of care.       Disposition: Discharged    New Prescriptions for this Visit:  New Prescriptions    NALOXONE (NARCAN) 4 MG/ACTUATION NASAL SPRAY, NON-AEROSOL    1 Spray by INTRANASAL route Every 2 minutes as needed       Follow up:    [facility] - Emergency Department  [address] [facility] [address]  [place] [address]  [phone]  Go to   As needed, If symptoms worsen    --------------------------------------------------------------------------------------------------------------------------------    Arrival: The patient arrived by ambulance and is alone.  History Obtained by: provider and/or reviewed via medical record  History Limitations: none    HPI:  [name] is a 33 y.o. White male presenting with presumed overdose.  Patient snorted an unknown substance earlier today.  He was found unresponsive and EMS administered a total of 6 mg of Narcan intranasally.  EMS notes incremental improvement with the doses of Narcan.  Currently patient states he still feels a little odd but denies any pain any where he is having no chest pain shortness of breath abdominal pain or other symptoms.  Patient denies any other ingestions as far as he is aware.  Patient does have a history of previous drug use..        ROS:  Constitutional: No fever, chills or weakness , +feels odd  Skin: No rash or diaphoresis  HENT: No headaches  Cardio: No  chest pain  Respiratory: No cough, wheezing or SOB  GI:  No nausea, vomiting, abdominal pain   MSK: No muscle aches, joint or back pain  Neuro:  +LOC, no numbness, tingling, or focal weakness  Psychiatric:  +substance abuse  All other systems reviewed and are negative.      Below pertinent information reviewed with patient and/or via MEDICAL RECORD NUMBER Past Medical History:   Diagnosis Date   . Depression        Medications Prior to Admission     Prescriptions    acetaminophen-codeine (TYLENOL #3) 300-30 mg Oral Tablet    Take 1 Tab by mouth Every 4  hours as needed for Other (PRN moderate to severe oral pain) for up to 7 days    clindamycin (CLEOCIN) 150 mg Oral Capsule    Take 150 mg by mouth Four times a day    Ibuprofen (MOTRIN) 600 mg Oral Tablet    Take 600 mg by mouth Four times a day as needed for Pain          Allergies   Allergen Reactions   . Penicillins        Past Surgical History:   Procedure Laterality Date   . HX HAND SURGERY         Family Medical History:     Problem Relation (Age of Onset)    Cancer Mother, Sister    Hypertension (High Blood Pressure) Father          Social History     Socioeconomic History   . Marital status: Single     Spouse name: Not on file   . Number of children: Not on file   . Years of education: Not on file   . Highest education level: Not on file   Occupational History   . Not on file   Social Needs   . Financial resource strain: Not on file   . Food insecurity     Worry: Not on file     Inability: Not on file   . Transportation needs     Medical: Not on file     Non-medical: Not on file   Tobacco Use   . Smoking status: Current Every Day Smoker     Packs/day: 1.00     Types: Cigarettes   . Smokeless tobacco: Never Used   Substance and Sexual Activity   . Alcohol use: No     Comment: denies   . Drug use: Yes     Types: Cocaine   . Sexual activity: Not on file   Lifestyle   . Physical activity     Days per week: Not on file     Minutes per session: Not on file   . Stress: Not on file   Relationships   . Social Wellsite geologist on phone: Not on file     Gets together: Not on file     Attends religious service: Not on file     Active member of club or organization: Not on file     Attends meetings of clubs or organizations: Not on file     Relationship status: Not on file   . Intimate partner violence     Fear of current or ex partner: Not on file     Emotionally abused: Not on file     Physically abused: Not on file     Forced sexual activity: Not on file   Other  Topics Concern   . Abuse/Domestic Violence Not  Asked   . Caffeine Concern Not Asked   . Calcium intake adequate Not Asked   . Computer Use Not Asked   . Drives Not Asked   . Exercise Concern Not Asked   . Helmet Use Not Asked   . Seat Belt Not Asked   . Special Diet Not Asked   . Sunscreen used Not Asked   . Uses Cane Not Asked   . Uses walker Not Asked   . Uses wheelchair Not Asked   . Right hand dominant Not Asked   . Left hand dominant Not Asked   . Ambidextrous Not Asked   . Shift Work Not Asked   . Unusual Sleep-Wake Schedule Not Asked   Social History Narrative   . Not on file         Objective:  ED Triage Vitals [05/25/18 0332]   BP (Non-Invasive) (!) 160/106   Heart Rate (!) 107   Respiratory Rate 20   Temperature 36.5 C (97.7 F)   SpO2 99 %   Weight 83.9 kg (185 lb)   Height           Nursing notes and vital signs reviewed.    Constitutional: Pleasant 33 y.o. male appears his stated age in fair health, in no acute distress, normal color, no cyanosis.   HENT:   Head: Normocephalic and atraumatic.   Mouth/Throat: Oropharynx is clear and moist.   Eyes: EOMI, PERRL   Neck: Trachea midline. Neck supple.  Cardiovascular: RRR, No murmurs, rubs or gallops. Intact distal pulses.  Pulmonary/Chest: BS equal bilaterally. No respiratory distress. No wheezes, rales or chest tenderness.   Abdominal: BS +. Abdomen soft, no tenderness, rebound or guarding.      Musculoskeletal: No edema, tenderness or deformity.  Skin: warm and dry. No rash, erythema, pallor or cyanosis  Psychiatric: normal mood and affect. Behavior is normal.   Neurological: Patient keenly alert and responsive, CN II-XII grossly intact, moving all extremities equally and fully    Labs:   Labs Reviewed - No data to display    Imaging:  No orders to display       EKG: not indicated    Patient seen by and discussed with attending physician, Dr. Dara HoyerBalcik.    Parts of this patients chart were completed in a retrospective fashion due to simultaneous direct patient care activities in the Emergency Department.    This note was partially generated using MModal Fluency Direct system.      Ned GraceBrandon Chee Dimon, MD   05/25/2018 , 05:44  [facility]  [place]  PGY-3 Emergency Medicine

## 2018-05-25 NOTE — ED Nurses Note (Signed)
Patient presents to ED via EMS for "overdose". Patient reports he was given unknown type of medication from a friend and he then snorted them. Patient found unresponsive by girlfriend. EMS arrived and administered 6mg  Narcan. Upon arrival to ED pt CAOx4.

## 2018-05-26 ENCOUNTER — Ambulatory Visit (INDEPENDENT_AMBULATORY_CARE_PROVIDER_SITE_OTHER): Payer: Self-pay | Admitting: Family

## 2018-06-05 ENCOUNTER — Encounter (INDEPENDENT_AMBULATORY_CARE_PROVIDER_SITE_OTHER): Payer: Self-pay | Admitting: Family

## 2018-06-07 ENCOUNTER — Ambulatory Visit (INDEPENDENT_AMBULATORY_CARE_PROVIDER_SITE_OTHER): Payer: Self-pay | Admitting: Family

## 2018-06-08 ENCOUNTER — Encounter (INDEPENDENT_AMBULATORY_CARE_PROVIDER_SITE_OTHER): Payer: Self-pay | Admitting: Family Medicine

## 2018-08-07 ENCOUNTER — Emergency Department: Admission: EM | Admit: 2018-08-07 | Discharge: 2018-08-07 | Discharge status: Against Medical Advice | Payer: 59

## 2018-08-07 ENCOUNTER — Other Ambulatory Visit: Payer: Self-pay

## 2018-08-07 DIAGNOSIS — Z5321 Procedure and treatment not carried out due to patient leaving prior to being seen by health care provider: Secondary | ICD-10-CM | POA: Insufficient documentation

## 2018-08-10 ENCOUNTER — Other Ambulatory Visit: Payer: Self-pay

## 2018-08-10 DIAGNOSIS — Z20822 Contact with and (suspected) exposure to covid-19: Secondary | ICD-10-CM

## 2018-08-11 LAB — SPECIMEN STATUS REPORT

## 2018-08-11 LAB — NOVEL CORONAVIRUS, NAA: SARS-CoV-2, NAA: NOT DETECTED

## 2018-08-28 ENCOUNTER — Other Ambulatory Visit: Payer: Self-pay

## 2018-08-28 DIAGNOSIS — Z20822 Contact with and (suspected) exposure to covid-19: Secondary | ICD-10-CM

## 2018-08-29 LAB — NOVEL CORONAVIRUS, NAA: SARS-CoV-2, NAA: NOT DETECTED

## 2018-10-19 ENCOUNTER — Other Ambulatory Visit: Payer: Self-pay

## 2018-10-19 ENCOUNTER — Emergency Department (HOSPITAL_COMMUNITY): Payer: 59

## 2018-10-19 ENCOUNTER — Emergency Department
Admission: EM | Admit: 2018-10-19 | Discharge: 2018-10-20 | Disposition: A | Discharge status: Home / Self Care | Payer: 59 | Attending: Emergency Medicine | Admitting: Emergency Medicine

## 2018-10-19 ENCOUNTER — Emergency Department (EMERGENCY_DEPARTMENT_HOSPITAL): Payer: 59

## 2018-10-19 DIAGNOSIS — F17210 Nicotine dependence, cigarettes, uncomplicated: Secondary | ICD-10-CM | POA: Insufficient documentation

## 2018-10-19 DIAGNOSIS — Z800 Family history of malignant neoplasm of digestive organs: Secondary | ICD-10-CM | POA: Insufficient documentation

## 2018-10-19 DIAGNOSIS — R1031 Right lower quadrant pain: Secondary | ICD-10-CM

## 2018-10-19 DIAGNOSIS — Z1159 Encounter for screening for other viral diseases: Secondary | ICD-10-CM

## 2018-10-19 DIAGNOSIS — Z8659 Personal history of other mental and behavioral disorders: Secondary | ICD-10-CM

## 2018-10-19 DIAGNOSIS — Z881 Allergy status to other antibiotic agents status: Secondary | ICD-10-CM

## 2018-10-19 DIAGNOSIS — K625 Hemorrhage of anus and rectum: Secondary | ICD-10-CM | POA: Insufficient documentation

## 2018-10-19 DIAGNOSIS — Z9189 Other specified personal risk factors, not elsewhere classified: Secondary | ICD-10-CM

## 2018-10-19 LAB — CBC WITH DIFF
BASOPHIL #: <0.1 10*3/uL (ref <=0.20)
BASOPHIL %: 1 %
EOSINOPHIL #: 0.18 10*3/uL (ref <=0.50)
EOSINOPHIL %: 3 %
HCT: 46.7 % (ref 38.9–52.0)
HGB: 15.7 g/dL (ref 13.4–17.5)
IMMATURE GRANULOCYTE #: <0.1 10*3/uL (ref <0.10)
IMMATURE GRANULOCYTE %: 1 % (ref 0–1)
LYMPHOCYTE #: 2.28 10*3/uL (ref 1.00–4.80)
LYMPHOCYTE %: 32 %
MCH: 29 pg (ref 26.0–32.0)
MCHC: 33.6 g/dL (ref 31.0–35.5)
MCV: 86.3 fL (ref 78.0–100.0)
MONOCYTE #: 0.78 10*3/uL (ref 0.20–1.10)
MONOCYTE %: 11 %
MPV: 10.2 fL (ref 8.7–12.5)
NEUTROPHIL #: 3.74 10*3/uL (ref 1.50–7.70)
NEUTROPHIL %: 52 %
PLATELETS: 310 10*3/uL (ref 150–400)
RBC: 5.41 10*6/uL (ref 4.50–6.10)
RDW-CV: 12.7 % (ref 11.5–15.5)
WBC: 7.1 10*3/uL (ref 3.7–11.0)

## 2018-10-19 LAB — BASIC METABOLIC PANEL
ANION GAP: 14 mmol/L — ABNORMAL HIGH (ref 4–13)
BUN/CREA RATIO: 18 (ref 6–22)
BUN: 16 mg/dL (ref 8–25)
CALCIUM: 9.4 mg/dL (ref 8.5–10.2)
CHLORIDE: 104 mmol/L (ref 96–111)
CO2 TOTAL: 23 mmol/L (ref 22–32)
CREATININE: 0.88 mg/dL (ref 0.62–1.27)
ESTIMATED GFR: >60 mL/min/{1.73_m2} (ref >60)
GLUCOSE: 91 mg/dL (ref 65–139)
POTASSIUM: 4.1 mmol/L (ref 3.5–5.1)
SODIUM: 141 mmol/L (ref 136–145)

## 2018-10-19 LAB — TYPE AND SCREEN
ABO/RH(D): O POS
ANTIBODY SCREEN: NEGATIVE

## 2018-10-19 LAB — HIV1/HIV2 SCREEN, COMBINED ANTIGEN AND ANTIBODY: HIV SCREEN, COMBINED ANTIGEN & ANTIBODY: NEGATIVE

## 2018-10-19 LAB — HEPATIC FUNCTION PANEL
ALBUMIN: 4 g/dL (ref 3.5–5.0)
ALKALINE PHOSPHATASE: 118 U/L — ABNORMAL HIGH (ref 45–115)
ALT (SGPT): 54 U/L (ref <55)
AST (SGOT): 39 U/L (ref 8–48)
BILIRUBIN DIRECT: 0.1 mg/dL (ref <0.3)
BILIRUBIN TOTAL: 0.2 mg/dL — ABNORMAL LOW (ref 0.3–1.3)
PROTEIN TOTAL: 7.5 g/dL (ref 6.4–8.3)

## 2018-10-19 LAB — C-REACTIVE PROTEIN(CRP),INFLAMMATION: CRP INFLAMMATION: 2 mg/L (ref <=8.0)

## 2018-10-19 LAB — PT/INR
INR: 0.97 (ref 0.80–1.20)
PROTHROMBIN TIME: 11.1 seconds (ref 9.1–13.9)

## 2018-10-19 LAB — LIPASE: LIPASE: 61 U/L (ref 10–80)

## 2018-10-19 LAB — HEPATITIS C ANTIBODY SCREEN WITH REFLEX TO HCV PCR: HCV ANTIBODY QUALITATIVE: REACTIVE — AB

## 2018-10-19 MED ORDER — KETOROLAC 15 MG/ML INJECTION SOLUTION
15.00 mg | INTRAMUSCULAR | Status: AC
Start: 2018-10-20 — End: 2018-10-19
  Administered 2018-10-19: 15 mg via INTRAVENOUS
  Filled 2018-10-19: qty 1

## 2018-10-19 MED ORDER — SODIUM CHLORIDE 0.9 % IV BOLUS
1000.00 mL | INJECTION | Status: AC
Start: 2018-10-19 — End: 2018-10-19
  Administered 2018-10-19: 0 mL via INTRAVENOUS
  Administered 2018-10-19: 1000 mL via INTRAVENOUS

## 2018-10-19 MED ORDER — SODIUM CHLORIDE 0.9 % (FLUSH) INJECTION SYRINGE
2.00 mL | INJECTION | Freq: Three times a day (TID) | INTRAMUSCULAR | Status: DC
Start: 2018-10-19 — End: 2018-10-20
  Administered 2018-10-19: 22:00:00

## 2018-10-19 MED ORDER — IOPAMIDOL 300 MG IODINE/ML (61 %) INTRAVENOUS SOLUTION
100.0000 mL | INTRAVENOUS | Status: AC
Start: 2018-10-19 — End: 2018-10-19
  Administered 2018-10-19: 23:00:00 100 mL via INTRAVENOUS

## 2018-10-19 MED ORDER — SODIUM CHLORIDE 0.9 % (FLUSH) INJECTION SYRINGE
2.00 mL | INJECTION | INTRAMUSCULAR | Status: DC | PRN
Start: 2018-10-19 — End: 2018-10-20

## 2018-10-19 MED ORDER — ACETAMINOPHEN 325 MG TABLET
650.00 mg | ORAL_TABLET | ORAL | Status: AC
Start: 2018-10-19 — End: 2018-10-19
  Administered 2018-10-19: 650 mg via ORAL
  Filled 2018-10-19: qty 2

## 2018-10-19 NOTE — ED Nurses Note (Signed)
Pt to ED with c/o RLQ pain that radiates into his rt groin, nausea and bloody stool.  Pt says he has had this problem for approx a month and a half but the last 3 days it has gotten worse.  Say the past 3 days he has passed "straight blood" in his stool with clots.  Denies vomiting.  Simona Huh, RN  10/19/2018, 22:13

## 2018-10-19 NOTE — ED Attending Handoff Note (Signed)
ED  ATTENDING TRANSFER OF CARE NOTE:    10/19/2018    [name]  [id number]    Care of patient assumed from Dr. Kathe Becton.  Patient History and physical exam and plan of care were discussed with prior attending physician at time of checkout.    Briefly, [name] is a 33 y.o. male who presented with abdominal pain.    CT negative.    Awaiting UA.  If negative, d/c.    ED COURSE (under my care):  DISPO: Discharged    Shaun Him, MD 10/19/2018, 23:38
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
## 2018-10-19 NOTE — ED Attending Note (Signed)
I was physically present and directly supervised this patients care. Patient seen and examined with the resident, Dr. Art Buff, and history and exam reviewed. Key elements in addition to and/or correction of that documentation are as follows:    Patient is a 33 y.o. male  presenting to the ED with chief complaint of abdominal pain. Patient with 2 months of episodic RLQ pain. Patient with no difficulty eating. No N/V/D. Patient with acute worsening over the past 3 days with BRBPR. No fever or hx of surgery.      ROS: Otherwise negative, if not commented on in the HPI.       PMH, PSH, medications, allergies, SH, and FH per resident note. Important aspects of these fields pertaining to today's visit taken into consideration during history/physical and MDM.    Filed Vitals:    10/19/18 2044   BP: (!) 179/116   Pulse: (!) 110   Resp: 20   Temp: 36.5 C (97.7 F)   SpO2: 97%        Physical Exam: PER RESIDENT   Vitals reviewed.    Workup:   Orders Placed This Encounter   . CT ABDOMEN PELVIS W IV CONTRAST   . CBC/DIFF   . BASIC METABOLIC PANEL, NON-FASTING   . LIPASE   . HEPATIC FUNCTION PANEL   . URINALYSIS, MACROSCOPIC AND MICROSCOPIC W/CULTURE REFLEX   . HEPATITIS C ANTIBODY SCREEN WITH REFLEX TO HCV PCR   . HIV1/HIV2 SCREEN, COMBINED ANTIGEN AND ANTIBODY   . CBC WITH DIFF   . URINALYSIS, MACROSCOPIC   . URINALYSIS, MICROSCOPIC   . PT/INR   . C-REACTIVE PROTEIN(CRP),INFLAMMATION   . CANCELED: HEPATITIS C VIRUS (HCV) RNA DETECTION AND QUANTIFICATION, PCR, PLASMA   . Refer to [facility] Gastroenterology   . Refer to [facility] Infectious Disease   . TYPE AND SCREEN   . CANCELED: INSERT & MAINTAIN PERIPHERAL IV ACCESS   . NS bolus infusion 1,000 mL   . acetaminophen (TYLENOL) tablet   . iopamidol (ISOVUE-300) 61% infusion   . ketorolac (TORADOL) 15 mg/mL injection       Course: Patient evaluated for abdominal pain    EKG: none  Imaging: reviewed    Labs: reviewed    MDM:   During the patient's stay in the emergency department, the  above listed imaging and/or labs were performed to assist with medical decision making and were reviewed by myself when available for review. Upon review of the ancillary tests listed above, in conjunction with the clinical history and physical exam, it seems that the patient with abdominal pain. Patient with intermittent pain. No guarding or rebound on exam. Patient with H/H Stable despite BRBPR. Patient with CT imaging negative. Patient with UA pending at the time of transfer of care.     Impression: Abdominal pain    Chart completed after conclusion of patient care due to time constraints of direct patient care during shift.

## 2018-10-19 NOTE — ED Nurses Note (Signed)
Patient transported to CT by EDT via wheelchair. Patient unmonitored.

## 2018-10-20 LAB — URINALYSIS, MACROSCOPIC
BILIRUBIN: NEGATIVE mg/dL
BLOOD: NEGATIVE mg/dL
COLOR: NORMAL
GLUCOSE: NEGATIVE mg/dL
KETONES: NEGATIVE mg/dL
LEUKOCYTES: NEGATIVE WBCs/uL
NITRITE: NEGATIVE
PH: 6 (ref 5.0–8.0)
PROTEIN: NEGATIVE mg/dL
SPECIFIC GRAVITY: 1.046 — ABNORMAL HIGH (ref 1.005–1.030)
UROBILINOGEN: NEGATIVE mg/dL

## 2018-10-20 LAB — URINALYSIS, MICROSCOPIC
RBCS: <1 /hpf (ref <6.0)
WBCS: 0 /hpf (ref <4.0)

## 2018-10-20 NOTE — ED Nurses Note (Signed)
PIV removed by EDT. Patient discharged. Catheter intact. 2x2 gauze and tape applied to site.  Bleeding controlled.

## 2018-10-20 NOTE — ED Nurses Note (Signed)
DC instructions given to pt.  Pt walked to waiting room by nurse.  Simona Huh, RN  10/20/2018, 00:48

## 2018-10-23 LAB — HEPATITIS C VIRUS (HCV) RNA DETECTION AND QUANTIFICATION, PCR, PLASMA
HCV QUANTITATIVE PCR: 8250000 [IU]/mL — ABNORMAL HIGH
HCV QUANTITATIVE RNA LOG: 6.92 LOG10 — ABNORMAL HIGH

## 2018-11-14 ENCOUNTER — Encounter (INDEPENDENT_AMBULATORY_CARE_PROVIDER_SITE_OTHER): Payer: Self-pay | Admitting: Family

## 2018-11-15 ENCOUNTER — Ambulatory Visit (INDEPENDENT_AMBULATORY_CARE_PROVIDER_SITE_OTHER): Payer: Self-pay | Admitting: Gastroenterology

## 2018-11-20 ENCOUNTER — Other Ambulatory Visit: Payer: Self-pay

## 2018-11-20 DIAGNOSIS — Z20822 Contact with and (suspected) exposure to covid-19: Secondary | ICD-10-CM

## 2018-11-22 ENCOUNTER — Ambulatory Visit: Payer: 59 | Admitting: Family Medicine

## 2018-11-22 LAB — NOVEL CORONAVIRUS, NAA: SARS-CoV-2, NAA: NOT DETECTED

## 2018-11-23 ENCOUNTER — Ambulatory Visit (HOSPITAL_COMMUNITY): Payer: Self-pay

## 2018-11-23 ENCOUNTER — Telehealth (HOSPITAL_COMMUNITY): Payer: Self-pay

## 2018-11-23 NOTE — Telephone Encounter (Signed)
CM scheduled patient for psych appt and they were requesting a D&A intake as well, so message has been forwarded to CM, [employer].    [place]. Colletta Maryland, CASE MANAGER  11/23/2018, 14:52

## 2018-11-23 NOTE — Telephone Encounter (Addendum)
CM contacted Gerald Stabs back but there was no answer so CM left message with call back number.    [place]. Colletta Maryland, CASE MANAGER  11/23/2018, 14:02      ----- Message from Kristeen Mans sent at 11/23/2018  1:41 PM EST -----  Gerald Stabs, CM from [facility] in [place], is requesting a return call to get pt schedule for an outpatient psych appt. Last seen 2016. Pt will be discharging tomorrow, 11/20. Please advise. Thank you!     [phone] ext [phone] Gerald Stabs)

## 2018-11-23 NOTE — Telephone Encounter (Signed)
-----   Message from [place]. Colletta Maryland, CASE MANAGER sent at 11/23/2018  2:49 PM EST -----  Regarding: D&A [place]! This fellow is discharging from [facility] in [place], they wanted a psych appt and D&A Intake. I scheduled the psych appt but told them I would send them your way!    Contact is [phone] ext [phone] Gerald Stabs, [place])    Thank you! :)

## 2018-11-23 NOTE — Telephone Encounter (Signed)
Case manager called Margreta Journey at [phone] to schedule a D&A intake for pt but there was no answer. Case manager left message asking for a call in to schedule an appointment.    Trixie Rude, CASE MANAGER  11/23/2018, 16:38

## 2018-11-25 ENCOUNTER — Inpatient Hospital Stay (HOSPITAL_COMMUNITY)
Admission: EM | Admit: 2018-11-25 | Discharge: 2018-11-25 | Disposition: A | Discharge status: Home / Self Care | Payer: 59 | Source: Other Acute Inpatient Hospital

## 2018-11-25 DIAGNOSIS — T542X1A Toxic effect of corrosive acids and acid-like substances, accidental (unintentional), initial encounter: Secondary | ICD-10-CM

## 2018-11-25 DIAGNOSIS — Y990 Civilian activity done for income or pay: Secondary | ICD-10-CM

## 2018-11-25 DIAGNOSIS — T310 Burns involving less than 10% of body surface: Secondary | ICD-10-CM

## 2018-11-25 DIAGNOSIS — T23402A Corrosion of unspecified degree of left hand, unspecified site, initial encounter: Secondary | ICD-10-CM

## 2018-12-04 ENCOUNTER — Ambulatory Visit (INDEPENDENT_AMBULATORY_CARE_PROVIDER_SITE_OTHER): Payer: BC Managed Care – PPO | Admitting: Family Medicine

## 2018-12-04 ENCOUNTER — Other Ambulatory Visit: Payer: Self-pay

## 2018-12-04 ENCOUNTER — Encounter: Payer: Self-pay | Admitting: Family Medicine

## 2018-12-04 VITALS — BP 224/140 | HR 77 | Temp 97.6°F | Ht 73.0 in | Wt 311.2 lb

## 2018-12-04 DIAGNOSIS — E119 Type 2 diabetes mellitus without complications: Secondary | ICD-10-CM | POA: Insufficient documentation

## 2018-12-04 DIAGNOSIS — I10 Essential (primary) hypertension: Secondary | ICD-10-CM | POA: Diagnosis not present

## 2018-12-04 DIAGNOSIS — E1169 Type 2 diabetes mellitus with other specified complication: Secondary | ICD-10-CM | POA: Diagnosis not present

## 2018-12-04 DIAGNOSIS — G4733 Obstructive sleep apnea (adult) (pediatric): Secondary | ICD-10-CM

## 2018-12-04 DIAGNOSIS — E1159 Type 2 diabetes mellitus with other circulatory complications: Secondary | ICD-10-CM | POA: Diagnosis not present

## 2018-12-04 DIAGNOSIS — E785 Hyperlipidemia, unspecified: Secondary | ICD-10-CM

## 2018-12-04 DIAGNOSIS — N529 Male erectile dysfunction, unspecified: Secondary | ICD-10-CM

## 2018-12-04 MED ORDER — DILTIAZEM HCL ER COATED BEADS 240 MG PO CP24: 240.0000 mg | ORAL_CAPSULE | Freq: Every day | ORAL | 0 refills | Status: DC

## 2018-12-04 MED ORDER — ROSUVASTATIN CALCIUM 40 MG PO TABS: 40.0000 mg | ORAL_TABLET | Freq: Every day | ORAL | 3 refills | Status: DC

## 2018-12-04 MED ORDER — METFORMIN HCL 1000 MG PO TABS: 1000.0000 mg | ORAL_TABLET | Freq: Two times a day (BID) | ORAL | 0 refills | Status: DC

## 2018-12-04 MED ORDER — SILDENAFIL CITRATE 100 MG PO TABS: 100.0000 mg | ORAL_TABLET | Freq: Every day | ORAL | 5 refills | Status: DC | PRN

## 2018-12-04 MED ORDER — LOSARTAN POTASSIUM-HCTZ 100-25 MG PO TABS: 1.0000 | ORAL_TABLET | Freq: Every day | ORAL | 0 refills | Status: DC

## 2018-12-04 NOTE — Progress Notes (Addendum)
  Subjective:    Patient ID: Jesse Duncan, male    DOB: [birth year], 33 y.o.   MRN: [id number]  Donold Marotto is a 33 y.o. male who presents for follow-up of Type 2 diabetes mellitus.  He has been off all medications for well over a year.  At 1 point in between jobs he spent a lot of time and energy getting himself in good shape and did get his weight down to 280 and felt great.  Since then he is now working 2 jobs which interferes with his eating habits and has gained weight back.  He recognizes the need to take better care of himself and is therefore here for that purpose.  He has no chest pain, shortness of breath, nausea, vomiting, headaches. He also has not been using his CPAP. He also said difficulty with erectile dysfunction and would like Viagra called in which she has used in the past.    Objective:    Physical Exam Alert and in no distress cardiac exam shows regular sinus rhythm without murmurs or gallops.  DTRs are normal.   Lab Review Diabetic Labs Latest Ref Rng & Units 03/04/2017 08/26/2016 07/22/2015 12/23/2014 01/11/2013  HbA1c - - 11.8% - 5.8(H) -  Chol 100 - 199 mg/dL 265(H) 354(H) - 254(H) 273(H)  HDL >39 mg/dL 43 40(L) - 43 41  Calc LDL 0 - 99 mg/dL 190(H) NOT CALC - 157(H) 184(H)  Triglycerides 0 - 149 mg/dL 158(H) 852(H) - 268(H) 241(H)  Creatinine 0.60 - 1.35 mg/dL - 0.94 1.14 1.07 1.05   BP/Weight 12/04/2018 02/28/2017 02/18/2017 01/20/2017 4/69/6295  Systolic BP 284 132 440 102 725  Diastolic BP 366 440 94 347 104  Wt. (Lbs) 311.2 347 348 337 337.8  BMI 41.06 45.78 45.91 44.46 44.57   Hemoglobin A1c is 7.7  [place]  reports that he has been smoking cigars and e-cigarettes. He has smoked for the past 6.00 years. He has never used smokeless tobacco. He reports current alcohol use of about 1.0 standard drinks of alcohol per week. He reports that he does not use drugs.     Assessment & Plan:    Type 2 diabetes mellitus without complication, without  long-term current use of insulin ([place]) - Plan: metFORMIN (GLUCOPHAGE) 1000 MG tablet, CBC with Differential, Comprehensive metabolic panel, Lipid panel  Erectile dysfunction, unspecified erectile dysfunction type - Plan: sildenafil (VIAGRA) 100 MG tablet  Hypertension associated with diabetes ([place]) - Plan: losartan-hydrochlorothiazide (HYZAAR) 100-25 MG tablet, diltiazem (CARTIA XT) 240 MG 24 hr capsule, CBC with Differential, Comprehensive metabolic panel  Hyperlipidemia associated with type 2 diabetes mellitus ([place]) - Plan: rosuvastatin (CRESTOR) 40 MG tablet, Lipid panel  Morbid obesity ([place]) - Plan: CBC with Differential, Comprehensive metabolic panel, Lipid panel  Accelerated hypertension  OSA (obstructive sleep apnea)  I will place him back on all of his medications.  Discussed diet and exercise with him.  Also discussed the fact that his blood pressure is way out of control.  He is also to start using his CPAP again.  Explained that blood pressure and CPAP are interrelated as well.  Currently I see no evidence of malignant hypertension.  Recheck here in 1 week.

## 2018-12-05 LAB — COMPREHENSIVE METABOLIC PANEL
ALT: 57 IU/L — ABNORMAL HIGH (ref 0–44)
AST: 29 IU/L (ref 0–40)
Albumin/Globulin Ratio: 1.7 (ref 1.2–2.2)
Albumin: 4.3 g/dL (ref 4.0–5.0)
Alkaline Phosphatase: 82 IU/L (ref 39–117)
BUN/Creatinine Ratio: 9 (ref 9–20)
BUN: 8 mg/dL (ref 6–20)
Bilirubin Total: 0.4 mg/dL (ref 0.0–1.2)
CO2: 24 mmol/L (ref 20–29)
Calcium: 9.5 mg/dL (ref 8.7–10.2)
Chloride: 99 mmol/L (ref 96–106)
Creatinine, Ser: 0.89 mg/dL (ref 0.76–1.27)
GFR calc Af Amer: 131 mL/min/{1.73_m2} (ref >59)
GFR calc non Af Amer: 113 mL/min/{1.73_m2} (ref >59)
Globulin, Total: 2.6 g/dL (ref 1.5–4.5)
Glucose: 271 mg/dL — ABNORMAL HIGH (ref 65–99)
Potassium: 4.1 mmol/L (ref 3.5–5.2)
Sodium: 138 mmol/L (ref 134–144)
Total Protein: 6.9 g/dL (ref 6.0–8.5)

## 2018-12-05 LAB — CBC WITH DIFFERENTIAL/PLATELET
Basophils Absolute: 0 10*3/uL (ref 0.0–0.2)
Basos: 0 %
EOS (ABSOLUTE): 0.2 10*3/uL (ref 0.0–0.4)
Eos: 3 %
Hematocrit: 43.2 % (ref 37.5–51.0)
Hemoglobin: 14 g/dL (ref 13.0–17.7)
Immature Grans (Abs): 0 10*3/uL (ref 0.0–0.1)
Immature Granulocytes: 0 %
Lymphocytes Absolute: 2.9 10*3/uL (ref 0.7–3.1)
Lymphs: 48 %
MCH: 26.8 pg (ref 26.6–33.0)
MCHC: 32.4 g/dL (ref 31.5–35.7)
MCV: 83 fL (ref 79–97)
Monocytes Absolute: 0.4 10*3/uL (ref 0.1–0.9)
Monocytes: 7 %
Neutrophils Absolute: 2.6 10*3/uL (ref 1.4–7.0)
Neutrophils: 42 %
Platelets: 274 10*3/uL (ref 150–450)
RBC: 5.23 x10E6/uL (ref 4.14–5.80)
RDW: 11.7 % (ref 11.6–15.4)
WBC: 6.1 10*3/uL (ref 3.4–10.8)

## 2018-12-05 LAB — LIPID PANEL
Chol/HDL Ratio: 7.9 ratio — ABNORMAL HIGH (ref 0.0–5.0)
Cholesterol, Total: 330 mg/dL — ABNORMAL HIGH (ref 100–199)
HDL: 42 mg/dL (ref >39)
LDL Chol Calc (NIH): 202 mg/dL — ABNORMAL HIGH (ref 0–99)
Triglycerides: 416 mg/dL — ABNORMAL HIGH (ref 0–149)
VLDL Cholesterol Cal: 86 mg/dL — ABNORMAL HIGH (ref 5–40)

## 2018-12-11 ENCOUNTER — Other Ambulatory Visit: Payer: Self-pay

## 2018-12-11 ENCOUNTER — Encounter: Payer: Self-pay | Admitting: Family Medicine

## 2018-12-11 ENCOUNTER — Ambulatory Visit (INDEPENDENT_AMBULATORY_CARE_PROVIDER_SITE_OTHER): Payer: BC Managed Care – PPO | Admitting: Family Medicine

## 2018-12-11 VITALS — BP 192/130 | HR 82 | Temp 96.9°F | Wt 311.2 lb

## 2018-12-11 DIAGNOSIS — G4733 Obstructive sleep apnea (adult) (pediatric): Secondary | ICD-10-CM

## 2018-12-11 DIAGNOSIS — I10 Essential (primary) hypertension: Secondary | ICD-10-CM

## 2018-12-11 NOTE — Progress Notes (Signed)
   Subjective:    Patient ID: Jesse Duncan, male    DOB: 1985-04-27, 33 y.o.   MRN: [id number]  HPI He is here for recheck on his blood pressure.  He has been on his medication for about a week.  He was also started back on his CPAP.  He has not started checking his blood sugar stating he forgot.  He has had no difficulty with headache, blurred vision, chest pain, shortness of breath.   Review of Systems     Objective:   Physical Exam Alert and in no distress.  Blood pressure is recorded.      Assessment & Plan:  Accelerated hypertension  OSA (obstructive sleep apnea) His blood pressure did come down slightly however I was hoping for a better response.  Encouraged him to continue with his medication as well as his CPAP and start checking his blood sugars.  I will have him return here in 1 month.  If no improvement, I will add another medication to his regimen.

## 2019-01-11 ENCOUNTER — Ambulatory Visit: Payer: BC Managed Care – PPO | Admitting: Family Medicine

## 2019-01-17 ENCOUNTER — Ambulatory Visit (INDEPENDENT_AMBULATORY_CARE_PROVIDER_SITE_OTHER): Payer: BC Managed Care – PPO | Admitting: Family Medicine

## 2019-01-17 ENCOUNTER — Other Ambulatory Visit: Payer: Self-pay

## 2019-01-17 ENCOUNTER — Encounter: Payer: Self-pay | Admitting: Family Medicine

## 2019-01-17 VITALS — BP 174/120 | HR 87 | Temp 97.1°F | Wt 299.2 lb

## 2019-01-17 DIAGNOSIS — I10 Essential (primary) hypertension: Secondary | ICD-10-CM | POA: Diagnosis not present

## 2019-01-17 DIAGNOSIS — E119 Type 2 diabetes mellitus without complications: Secondary | ICD-10-CM | POA: Diagnosis not present

## 2019-01-17 LAB — POCT UA - MICROALBUMIN
Albumin/Creatinine Ratio, Urine, POC: 64.8
Creatinine, POC: 43.7 mg/dL
Microalbumin Ur, POC: 28.3 mg/L

## 2019-01-17 LAB — POCT GLYCOSYLATED HEMOGLOBIN (HGB A1C): Hemoglobin A1C: 12.8 % — AB (ref 4.0–5.6)

## 2019-01-17 MED ORDER — TRULICITY 0.75 MG/0.5ML ~~LOC~~ SOAJ: 0.7500 mg | SUBCUTANEOUS | 5 refills | Status: DC

## 2019-01-17 MED ORDER — JARDIANCE 25 MG PO TABS: 25.0000 mg | ORAL_TABLET | Freq: Every day | ORAL | 5 refills | Status: DC

## 2019-01-17 MED ORDER — AMLODIPINE BESYLATE 10 MG PO TABS: 10.0000 mg | ORAL_TABLET | Freq: Every day | ORAL | 3 refills | Status: DC

## 2019-01-17 NOTE — Addendum Note (Signed)
Addended by: Renelda Loma on: 01/17/2019 04:27 PM   Modules accepted: Orders

## 2019-01-17 NOTE — Progress Notes (Signed)
   Subjective:    Patient ID: Barret Esquivel, male    DOB: July 28, 1985, 34 y.o.   MRN: [id number]  HPI He is here for recheck.  He has been taking his medications as prescribed.  He has also been checking his blood sugars and they are in the 290 range.  He has had difficulty visually and plans to see ophthalmology.   Review of Systems     Objective:   Physical Exam  Alert and in no distress.  Blood pressure is recorded. Hemoglobin A1c is 12.8    Assessment & Plan:  Accelerated hypertension - Plan: amLODipine (NORVASC) 10 MG tablet  Type 2 diabetes mellitus without complication, without long-term current use of insulin (HCC) - Plan: empagliflozin (JARDIANCE) 25 MG TABS tablet, Dulaglutide (TRULICITY) 0.75 MG/0.5ML SOPN I discussed the fact that we need to get his diabetes under better control as well as his blood pressure.  Explained that these new medicines can help with both blood pressure weight and diabetes and will also add another BP med.  Discussed side effects of all these medications.  Also demonstrated the use of the Trulicity.  Recheck here in 1 month.

## 2019-01-19 ENCOUNTER — Ambulatory Visit (HOSPITAL_COMMUNITY): Payer: 59 | Admitting: Student in an Organized Health Care Education/Training Program

## 2019-01-19 NOTE — H&P (Deleted)
[facility] Medicine and Psychiatry      Patient name:  [name]   Chart number:  [id number]  Date of birth:  1985/12/29  Date of service:  01/19/2019  Attending Staff: Dr. Marland Kitchen    Information Obtained from: Patient and ***    IDENTIFICATION: [name] is a 34 y.o. male from [facility] [place] [address]  CC: "***"  History of Present Illness:   [name] is a 34 y.o. male who presents for outpatient psychiatric intake to establish for care.  Patient ***            DEPRESSION: (-) Poor Sleep, (-) Anhedonia, (-) Guilt, (-) Poor Energy, (-) Poor Concentration, (-) Appetite Change, (-) Psychomotor slowing, (-) SI/HI with/without plan.     ANXIETY: (-) social anxiety (-) agorophobia. (-) panic attacks (characterized by SOB, CP, numbness/tingling, palpitations, diaphoresis, sense of impending doom). No obsessive thoughts accompanied by compulsive behavior.    MANIA: Pt denies periods of time with decreased need for sleep, characterized by pressured speech, elevated mood, impulsivity, grandiosity, racing thoughts, and increased goal-directed activity.     PSYCHOSIS: (-) A/VH, delusions, paranoia. No tactile hallucinations.     TRAUMA: (-) History of abuse (sexual, physical, emotional). (-) flashbacks, (-) nightmares.      SUBSTANCE ABUSE  Alcohol : Denies  Benzodiazapine's: Denies  Tobacco : Cigarettes 0 pack per day  THC: Denies  Opiates : Denies  Stimulants: Denies  Other: Denies    Longest sober period:   AA/NA meetings:   Antabuse/campral:       Past Psychiatric History:    Prior Diagnoses:   -***  Current Outpatient:   -***  Past Outpatient:   -***  Inpatient:   -***  Current Medications:   -***  Past Medication Trials:   -***  Suicide Attempts:   -***    Past Medical History: ***  Past Medical History:   Diagnosis Date   . Depression            History of Seizures:  ***  History of Closed Head Injuries:  ***      Past Surgical History:  ***  Past Surgical History:   Procedure Laterality Date   . HX HAND SURGERY             Current Outpatient Medications: ***  Current Outpatient Medications   Medication Sig   . Ibuprofen (MOTRIN) 600 mg Oral Tablet Take 600 mg by mouth Four times a day as needed for Pain   . naloxone (NARCAN) 4 mg/actuation Nasal Spray, Non-Aerosol 1 Spray by INTRANASAL route Every 2 minutes as needed       Allergies: ***  Allergies   Allergen Reactions   . Penicillins        Social History:  Occupation:  ***  Highest degree of education:  Publishing rights manager: ***  Marital status:  ***  Sexual orientation: Heterosexual  Children:  ***  Living situation:  ***  Support System: ***  Religous Affiliation: ***  Current Legal Issues: ***  Past Legal Issues:   DUIs: Denies  Drug Charges: Denies  Childhood: ***  Physical/sexual abuse: ***   Other trauma: ***      Family History: ***  Suicides:   Mental illnesses:   Substance use:   Family Medical History:     Problem Relation (Age of Onset)    Cancer Mother, Sister    Hypertension (High Blood  Pressure) Father                ROS: Other than ROS in the HPI, all other systems were negative.      On PE:  Patient appears to be in healthy condition, in NAD. Normal gait without abnormal movements. No visible lesions on exposed skin surfaces. Chest expansion is equal and symmetric. Spontaneous movement of extremities throughout. No focal neurological deficits noted.       Mental Status Exam:   Orientation: {ORIENTATION BMED (AMB):[phone]}.     Appearance:  {APPEARANCE BMED (AMB):[phone]}.     Eye Contact:  {EYE CONTACT BMED (AMB):[phone]}.     Behavior:  {BEHAVIOR BMED (AMB):[phone]}.     Attention:  {AATTENTION BMED (AMB):[phone]}.     Speech:  {SPEECH BMED (AMB):[phone]}.     Motor:  {MOTOR BMED (AMB):[phone]}.     Mood:  {MOOD BMED (AMB):[phone]}.     Afffect:  {AFFECT BMED (AMB):[phone]}.     Thought Process:  {THOUGHT PROCESS BMED (AMB):[phone]}.     Thought  Content:  {THOUGHT CONTENT BMED (AMB):[phone]}.    Suicidal Ideation:  {SUICIDAL IDEATION BMED (AMB):[phone]}.     Homicidal Ideaton:  {HOMICIDAL IDEATION BMED (AMB):[phone]}.      Perception:  {PERCEPTION BMED (AMB):[phone]}   Cognition:  {COGNITION BMED (AMB):[phone]}.     Insight:  {INSIGHT BMED (AMB):[phone]}.     Judgement:  {JUDGEMENT BMED (AMB):[phone]}.          Assessment:      Psychiatric Diagnoses:   Chronic Medical Conditions:     Medication trials:     Plan:  Medications:  ***    Laboratory Studies:    Therapy:  ***    Follow Up:    -Advised patient to call or use MyChart with any questions or concerns. Given business card.  -Patient was advised to call 911 and/or go to the nearest emergency department should his condition worsen or thoughts of suicide or homicide occur.       Beaver Rochester, MD  01/19/2019, 11:40  PGY-1 Behavioral Medicine & Psychiatry  [address], [place] [address]  Phone: [phone] Fax: [phone]

## 2019-01-22 ENCOUNTER — Emergency Department (EMERGENCY_DEPARTMENT_HOSPITAL): Payer: 59

## 2019-01-22 ENCOUNTER — Other Ambulatory Visit: Payer: Self-pay

## 2019-01-22 ENCOUNTER — Emergency Department (HOSPITAL_COMMUNITY): Payer: 59

## 2019-01-22 ENCOUNTER — Emergency Department
Admission: EM | Admit: 2019-01-22 | Discharge: 2019-01-22 | Disposition: A | Discharge status: Home / Self Care | Payer: 59 | Attending: Emergency Medicine | Admitting: Emergency Medicine

## 2019-01-22 DIAGNOSIS — S069X3A Unspecified intracranial injury with loss of consciousness of 1 hour to 5 hours 59 minutes, initial encounter: Secondary | ICD-10-CM | POA: Insufficient documentation

## 2019-01-22 DIAGNOSIS — M549 Dorsalgia, unspecified: Secondary | ICD-10-CM

## 2019-01-22 DIAGNOSIS — S0990XA Unspecified injury of head, initial encounter: Secondary | ICD-10-CM

## 2019-01-22 DIAGNOSIS — F17210 Nicotine dependence, cigarettes, uncomplicated: Secondary | ICD-10-CM | POA: Insufficient documentation

## 2019-01-22 DIAGNOSIS — I4581 Long QT syndrome: Secondary | ICD-10-CM

## 2019-01-22 DIAGNOSIS — W19XXXA Unspecified fall, initial encounter: Secondary | ICD-10-CM

## 2019-01-22 DIAGNOSIS — M542 Cervicalgia: Secondary | ICD-10-CM

## 2019-01-22 DIAGNOSIS — S298XXA Other specified injuries of thorax, initial encounter: Secondary | ICD-10-CM

## 2019-01-22 DIAGNOSIS — W108XXA Fall (on) (from) other stairs and steps, initial encounter: Secondary | ICD-10-CM

## 2019-01-22 DIAGNOSIS — R519 Headache, unspecified: Secondary | ICD-10-CM

## 2019-01-22 DIAGNOSIS — F1410 Cocaine abuse, uncomplicated: Secondary | ICD-10-CM | POA: Insufficient documentation

## 2019-01-22 DIAGNOSIS — S3991XA Unspecified injury of abdomen, initial encounter: Secondary | ICD-10-CM

## 2019-01-22 DIAGNOSIS — R9431 Abnormal electrocardiogram [ECG] [EKG]: Secondary | ICD-10-CM

## 2019-01-22 DIAGNOSIS — S199XXA Unspecified injury of neck, initial encounter: Secondary | ICD-10-CM

## 2019-01-22 DIAGNOSIS — S069X1A Unspecified intracranial injury with loss of consciousness of 30 minutes or less, initial encounter: Secondary | ICD-10-CM

## 2019-01-22 DIAGNOSIS — S299XXA Unspecified injury of thorax, initial encounter: Secondary | ICD-10-CM

## 2019-01-22 DIAGNOSIS — W109XXA Fall (on) (from) unspecified stairs and steps, initial encounter: Secondary | ICD-10-CM | POA: Insufficient documentation

## 2019-01-22 DIAGNOSIS — S3993XA Unspecified injury of pelvis, initial encounter: Secondary | ICD-10-CM

## 2019-01-22 LAB — VENOUS BLOOD GAS/LACTATE/CO-OX/LYTES (NA/K/CA/CL/GLUC) - ORS ONLY
BASE EXCESS: 1.4 mmol/L (ref ?–3.0)
BICARBONATE (VENOUS): 25.9 mmol/L (ref 22.0–26.0)
CARBOXYHEMOGLOBIN: 1.1 % (ref 0.0–2.5)
CHLORIDE: 107 mmol/L (ref 101–111)
GLUCOSE: 100 mg/dL (ref 60–105)
HEMOGLOBIN: 15.2 g/dL (ref 12.0–18.0)
IONIZED CALCIUM: 1.07 mmol/L — ABNORMAL LOW (ref 1.10–1.35)
LACTATE: 1.4 mmol/L — ABNORMAL HIGH (ref 0.0–1.3)
MET-HEMOGLOBIN: <0.1 % (ref 0.0–2.0)
O2CT: 19.9 % — ABNORMAL HIGH (ref 6.7–17.5)
OXYHEMOGLOBIN: 92.9 % — ABNORMAL HIGH (ref 40.0–80.0)
PCO2 (VENOUS): 20 mmHg — ABNORMAL LOW (ref 41.00–51.00)
PH (VENOUS): 7.62 (ref 7.31–7.41)
PO2 (VENOUS): 81 mmHg — ABNORMAL HIGH (ref 35.0–50.0)
SODIUM: 134 mmol/L — ABNORMAL LOW (ref 137–145)
WHOLE BLOOD POTASSIUM: 2.9 mmol/L — ABNORMAL LOW (ref 3.5–4.6)

## 2019-01-22 LAB — CBC WITH DIFF
BASOPHIL #: <0.1 10*3/uL (ref <=0.20)
BASOPHIL %: 1 %
EOSINOPHIL #: <0.1 10*3/uL (ref <=0.50)
EOSINOPHIL %: 1 %
HCT: 44.4 % (ref 38.9–52.0)
HGB: 15.4 g/dL (ref 13.4–17.5)
IMMATURE GRANULOCYTE #: <0.1 10*3/uL (ref <0.10)
IMMATURE GRANULOCYTE %: 0 % (ref 0–1)
LYMPHOCYTE #: 3.22 10*3/uL (ref 1.00–4.80)
LYMPHOCYTE %: 27 %
MCH: 28.4 pg (ref 26.0–32.0)
MCHC: 34.7 g/dL (ref 31.0–35.5)
MCV: 81.9 fL (ref 78.0–100.0)
MONOCYTE #: 1.4 10*3/uL — ABNORMAL HIGH (ref 0.20–1.10)
MONOCYTE %: 12 %
MPV: 10.3 fL (ref 8.7–12.5)
NEUTROPHIL #: 7.2 10*3/uL (ref 1.50–7.70)
NEUTROPHIL %: 59 %
PLATELETS: 271 10*3/uL (ref 150–400)
RBC: 5.42 10*6/uL (ref 4.50–6.10)
RDW-CV: 11.5 % (ref 11.5–15.5)
WBC: 12 10*3/uL — ABNORMAL HIGH (ref 3.7–11.0)

## 2019-01-22 LAB — BASIC METABOLIC PANEL
ANION GAP: 14 mmol/L — ABNORMAL HIGH (ref 4–13)
BUN/CREA RATIO: 28 — ABNORMAL HIGH (ref 6–22)
BUN: 27 mg/dL — ABNORMAL HIGH (ref 8–25)
CALCIUM: 9.5 mg/dL (ref 8.5–10.2)
CHLORIDE: 106 mmol/L (ref 96–111)
CO2 TOTAL: 19 mmol/L — ABNORMAL LOW (ref 22–32)
CREATININE: 0.96 mg/dL (ref 0.62–1.27)
ESTIMATED GFR: >60 mL/min/{1.73_m2} (ref >60)
GLUCOSE: 99 mg/dL (ref 65–139)
POTASSIUM: 3.2 mmol/L — ABNORMAL LOW (ref 3.5–5.1)
SODIUM: 139 mmol/L (ref 136–145)

## 2019-01-22 LAB — ETHANOL, SERUM/PLASMA: ETHANOL: <10 mg/dL (ref <10)

## 2019-01-22 LAB — ETHANOL, SERUM: ETHANOL: NOT DETECTED

## 2019-01-22 LAB — ECG 12-LEAD
Atrial Rate: 84 {beats}/min
Calculated P Axis: 53 degrees
Calculated R Axis: 69 degrees
Calculated T Axis: 66 degrees
PR Interval: 144 ms
QRS Duration: 88 ms
QT Interval: 428 ms
QTC Calculation: 505 ms
Ventricular rate: 84 {beats}/min

## 2019-01-22 LAB — TYPE AND SCREEN
ABO/RH(D): O POS
ANTIBODY SCREEN: NEGATIVE

## 2019-01-22 LAB — VENOUS BLOOD GAS/LACTATE
%FIO2 (VENOUS): 21 %
BASE EXCESS: 0.7 mmol/L (ref ?–3.0)
BICARBONATE (VENOUS): 25.4 mmol/L (ref 22.0–26.0)
LACTATE: 0.8 mmol/L (ref 0.0–1.3)
PCO2 (VENOUS): 35 mmHg — ABNORMAL LOW (ref 41.00–51.00)
PH (VENOUS): 7.45 — ABNORMAL HIGH (ref 7.31–7.41)
PO2 (VENOUS): 64 mmHg — ABNORMAL HIGH (ref 35.0–50.0)

## 2019-01-22 LAB — PTT (PARTIAL THROMBOPLASTIN TIME): APTT: 28.8 s (ref 24.2–37.5)

## 2019-01-22 LAB — VENOUS BLOOD GAS/LACTATE/CO-OX/LYTES (NA/K/CA/CL/GLUC): %FIO2 (VENOUS): 21 %

## 2019-01-22 LAB — PT/INR
INR: 1.16 (ref 0.80–1.20)
PROTHROMBIN TIME: 13.4 s (ref 9.1–13.9)

## 2019-01-22 MED ORDER — SODIUM CHLORIDE 0.9 % (FLUSH) INJECTION SYRINGE
2.00 mL | INJECTION | INTRAMUSCULAR | Status: DC | PRN
Start: 2019-01-22 — End: 2019-01-22

## 2019-01-22 MED ORDER — SODIUM CHLORIDE 0.9 % INTRAVENOUS SOLUTION
INTRAVENOUS | Status: DC
Start: 2019-01-22 — End: 2019-01-22

## 2019-01-22 MED ORDER — SODIUM CHLORIDE 0.9 % (FLUSH) INJECTION SYRINGE
2.00 mL | INJECTION | Freq: Three times a day (TID) | INTRAMUSCULAR | Status: DC
Start: 2019-01-22 — End: 2019-01-22

## 2019-01-22 MED ORDER — IOPAMIDOL 300 MG IODINE/ML (61 %) INTRAVENOUS SOLUTION
80.0000 mL | INTRAVENOUS | Status: AC
Start: 2019-01-22 — End: 2019-01-22
  Administered 2019-01-22: 19:00:00 80 mL via INTRAVENOUS

## 2019-01-22 MED ORDER — LORAZEPAM 2 MG/ML INJECTION SOLUTION
2.00 mg | INTRAMUSCULAR | Status: AC
Start: 2019-01-22 — End: 2019-01-22
  Administered 2019-01-22: 20:00:00 2 mg via INTRAVENOUS
  Filled 2019-01-22: qty 1

## 2019-01-22 NOTE — ED Resident Handoff Note (Signed)
[facility] - Emergency Department   Provider in Triage Note     Patient Name: [name]  Patient MRN: [id number]  Date and Time of Assessment: 01/22/2019 18:25     There were no vitals filed for this visit.    Chief Complaint   Patient presents with   . Head Injury     head injury , states he slipped on step , hit head on top step , , states he thinks he was knocked out , no lacerations , neck pain , c -collar placed in triage,  headache        Brief HPI: fall down 5 steps, hit head, + LOC, + neck pain. Pt was confused after event. No anticoag use. C-collar placed in triage.    Physical Exam: GCS 15    Preliminary Plan:  Patient roomed in ED      Hassell Done, PA-C  01/22/2019, 18:25-i

## 2019-01-22 NOTE — H&P (Signed)
[facility]  Trauma History & Physical      [name], [name], 34 y.o. male  Date of Birth:  09/10/1985  Encounter Start Date:  01/22/2019  Inpatient Admission Date: 01/22/2019         Attending Physician: Janora Norlander, MD    HPI:     Trauma Level Alert: P2  Arriving from: Scene    Pre-Hospital Report:  Time of Injury: 1100  Patient Condition: Stable  Glasgow Coma Scale: Eye opening: 4 spontaneous, Verbal resonse:  5 oriented, Best motor response:  6 obeys commands  Intubated: No   Fluids Received in Route: No fluids recieved  Loss of Consciousness: Yes  <5 minutes     Mechanism of Injury:   Fall down 5 steps     Arrival to [place]:  Information Obtained from: patient  Chief Complaint: neck pain, back pain      Additional HPI Details:  34 yo M s/p fall down 5 steps. Positive LOC. GCS 15. Reporting neck pain, and headache. Denies chest pain, abdominal pain, extremity pain. Reporting history of drug abuse, Meth use last week, cocaine today.     ROS:  MUST comment on all "Abnormal" findings   ROS Other than ROS in the HPI, all other systems were negative.      PAST MEDICAL/ FAMILY/ SOCIAL HISTORY:       Past Medical History:   Diagnosis Date   . Depression          Tetanus: Less than 5 years  Medications Prior to Admission     Prescriptions    Ibuprofen (MOTRIN) 600 mg Oral Tablet    Take 600 mg by mouth Four times a day as needed for Pain    naloxone (NARCAN) 4 mg/actuation Nasal Spray, Non-Aerosol    1 Spray by INTRANASAL route Every 2 minutes as needed      Seroquel, vistaril, Cogentin, subutex   Allergies   Allergen Reactions   . Penicillins      Past Surgical History:   Procedure Laterality Date   . HX HAND SURGERY           Social History     Tobacco Use   . Smoking status: Current Every Day Smoker     Packs/day: 1.00     Types: Cigarettes   . Smokeless tobacco: Never Used   Substance Use Topics   . Alcohol use: No     Comment: denies   . Drug use: Yes     Types: Cocaine            Code Status:  Code Status Information     Code Status    Prior        Alcoholism/Chronic alcohol use: No  Current Smoker: Yes  Drug Use Disorder: yes, meth, cocaine   Functional Health Status: Independent - No assistance required for activities of daily living (May Use prosthetics or DME)  COPD: Patient does not have COPD  Cirrhosis no  CHF no  Angina within the last 30 days No  HX of Myocardial Infarction (MI) No  Hypertension requiring medications No  Chronic Renal Failure (prior/active Hemodialysis or Peritoneal Dialysis) No  Coma (less than 8): Coma -Not applicable  Dementia No  ADD/ADHD No  Major Psychiatric Illness Yes  Congenital abnormality No  Disseminated Cancer (metastatic disease) No  Steroid Use in the last 30 days (oral or inhaled) No  Diabetes (oral meds and/or insulin use) No  Premature Birth (born before [redacted] weeks gestation) No    Family History:   Family Medical History:     Problem Relation (Age of Onset)    Cancer Mother, Sister    Hypertension (High Blood Pressure) Father          PHYSICAL EXAMINATION: MUST comment on all "Abnormal" findings    INITIAL VITALS: Temperature: 35.9 C (96.6 F)  BP (Non-Invasive): (!) 141/82  Heart Rate: 91  Respiratory Rate: 18  Pain Score (Numeric, Faces): 10  SpO2: 99 %  EXAM: Temperature: 35.9 C (96.6 F)  Heart Rate: 91  BP (Non-Invasive): (!) 141/82  Respiratory Rate: 18  SpO2: 99 %  Pain Score (Numeric, Faces): 10  GEN:   NAD  HEENT:   Normocephalic;      , atraumatic pupils equal, round and reactive to light; ,  extraocular movements are intact., Conjunctivae pink, nasal mucosa normal, mucous membranes moist. and No malocclusion.   NECK:   Tender to palpation   PULM:   Lung sounds clear to auscultation bilaterally with equal chest expansion  CARDIAC:   Regular rate and rhythm  CHEST::External examination non-tender to palpation. and No abrasions or contusions  ABD:   Abdomen soft, nontender, and nondistended.  PELVIS: Non-tender to compression  /palpation and No evidence of injury  GU/RECTAL: Normal external inspection.   BACK:  No evidence of injury, no step-off and tender to palpation thoracic spine   MS: Atraumatic.  , Normal strength of all extremities.  and Normal Range of motion of all extremities.   NEURO:  Alert and oriented to person, place and time.    and Cranial nerves grossly intact.   GLASGOW COMA SCALE: Eye opening: 4 spontaneous, Verbal resonse:  5 oriented, Best motor response:  6 obeys commands  VASCULAR:  All pulses palpable and equal bilaterally and DP/PT pulses palpable and equal bilaterally  INTEGUMENTARY:  Pink, warm, and dry and intact throughout without evidence of injury  PSYCHOSOCIAL: Pleasant.  Normal affect.    WOUND/INCISION: None    DIAGNOSTIC STUDIES: Comment on "Positives"   Labs:  Lab Results Today:    Results for orders placed or performed during the hospital encounter of 01/22/19 (from the past 24 hour(s))   PT/INR   Result Value Ref Range    PROTHROMBIN TIME 13.4 9.1 - 13.9 seconds    INR 1.16 0.80 - 1.20   PTT (PARTIAL THROMBOPLASTIN TIME)   Result Value Ref Range    APTT 28.8 24.2 - 37.5 seconds   ETHANOL, SERUM   Result Value Ref Range    ETHANOL <10 <10 mg/dL    ETHANOL None Detected    TYPE AND SCREEN   Result Value Ref Range    UNITS ORDERED NOT STATED         ABO/RH(D) O POSITIVE     ANTIBODY SCREEN NEGATIVE     SPECIMEN EXPIRATION DATE 01/25/2019    VENOUS BLOOD GAS/COOX/LYTES/LAC   Result Value Ref Range    %FIO2 (VENOUS) 21.0 %    PH (VENOUS) 7.62 (HH) 7.31 - 7.41    PCO2 (VENOUS) 20.00 (L) 41.00 - 51.00 mm/Hg    PO2 (VENOUS) 81.0 (H) 35.0 - 50.0 mm/Hg    BASE EXCESS 1.4 -3.0 - 3.0 mmol/L    BICARBONATE (VENOUS) 25.9 22.0 - 26.0 mmol/L    SODIUM 134 (L) 137 - 145 mmol/L    WHOLE BLOOD POTASSIUM 2.9 (L) 3.5 - 4.6 mmol/L    CHLORIDE 107 101 - 111  mmol/L    IONIZED CALCIUM 1.07 (L) 1.10 - 1.35 mmol/L    GLUCOSE 100 60 - 105 mg/dL    LACTATE 1.4 (H) 0.0 - 1.3 mmol/L    HEMOGLOBIN 15.2 12.0 - 18.0 g/dL     OXYHEMOGLOBIN 92.9 (H) 40.0 - 80.0 %    CARBOXYHEMOGLOBIN 1.1 0.0 - 2.5 %    MET-HEMOGLOBIN <0.1 0.0 - 2.0 %    O2CT 19.9 (H) 6.7 - 17.5 %   BASIC METABOLIC PANEL   Result Value Ref Range    SODIUM 139 136 - 145 mmol/L    POTASSIUM 3.2 (L) 3.5 - 5.1 mmol/L    CHLORIDE 106 96 - 111 mmol/L    CO2 TOTAL 19 (L) 22 - 32 mmol/L    ANION GAP 14 (H) 4 - 13 mmol/L    CALCIUM 9.5 8.5 - 10.2 mg/dL    GLUCOSE 99 65 - 139 mg/dL    BUN 27 (H) 8 - 25 mg/dL    CREATININE 0.96 0.62 - 1.27 mg/dL    BUN/CREA RATIO 28 (H) 6 - 22    ESTIMATED GFR >60 >60 mL/min/1.26m2   CBC WITH DIFF   Result Value Ref Range    WBC 12.0 (H) 3.7 - 11.0 x10^3/uL    RBC 5.42 4.50 - 6.10 x10^6/uL    HGB 15.4 13.4 - 17.5 g/dL    HCT 44.4 38.9 - 52.0 %    MCV 81.9 78.0 - 100.0 fL    MCH 28.4 26.0 - 32.0 pg    MCHC 34.7 31.0 - 35.5 g/dL    RDW-CV 11.5 11.5 - 15.5 %    PLATELETS 271 150 - 400 x10^3/uL    MPV 10.3 8.7 - 12.5 fL    NEUTROPHIL % 59 %    LYMPHOCYTE % 27 %    MONOCYTE % 12 %    EOSINOPHIL % 1 %    BASOPHIL % 1 %    NEUTROPHIL # 7.20 1.50 - 7.70 x10^3/uL    LYMPHOCYTE # 3.22 1.00 - 4.80 x10^3/uL    MONOCYTE # 1.40 (H) 0.20 - 1.10 x10^3/uL    EOSINOPHIL # <0.10 <=0.50 x10^3/uL    BASOPHIL # <0.10 <=0.20 x10^3/uL    IMMATURE GRANULOCYTE % 0 0 - 1 %    IMMATURE GRANULOCYTE # <0.10 <0.10 x10^3/uL       Radiology:    OSH Films with reads-  None      Internal Studies-    Results for orders placed or performed during the hospital encounter of 01/22/19 (from the past 24 hour(s))   XR CHEST AP MOBILE     Status: None    Narrative    CErick Male, 34years old.    XR AP MOBILE CHEST performed on 01/22/2019 6:54 PM.    REASON FOR EXAM:  Chest Trauma    TECHNIQUE: 1 views/1 images submitted for interpretation.    COMPARISON:  Chest radiograph dated 09/09/2013    FINDINGS:  The cardiomediastinal silhouette is within normal limits of  size. The right costophrenic angle is excluded from view. No evidence of  focal consolidation, pneumothorax, or pleural  effusion.      Impression    No acute cardiopulmonary process.   XR PELVIS     Status: None    Narrative    Hebert N Morss  Male, 34years old.    XR PELVIS performed on 01/22/2019 6:54 PM.    REASON FOR EXAM:  Pelvic Injury  TECHNIQUE: 1 views/1 images submitted for interpretation.    COMPARISON:  CT abdomen pelvis dated 10/19/2018    FINDINGS:  No acute fracture or malalignment of the visualized pelvis. The  bilateral upper iliac crests are excluded from view. The sacroiliac joints  are limited in evaluation secondary to overlying bowel gas. No pubic  symphysis diastases. Bilateral femoroacetabular joint spaces are overall  maintained.      Impression    No acute osseous abnormality of the visualized pelvis.   CT BRAIN WO IV CONTRAST     Status: None (Preliminary result)    Narrative    Kelwin N Melody  Male, 34 years old.    CT BRAIN WO IV CONTRAST performed on 01/22/2019 7:15 PM.    REASON FOR EXAM:  Head Trauma    RADIATION DOSE: 1106.22 mGy.cm    TECHNIQUE: CT brain without IV contrast    COMPARISON: CT brain without IV contrast dated 09/09/2013    FINDINGS:  No acute hemorrhage, major vessel vascular territory infarct, or  extra-axial fluid collection. Ventricles are stable in size and  configuration. Basal cisterns are patent. Bilateral maxillary sinus  sentences. The radial visualized sinuses are well-aerated. Mastoid air  cells are well-aerated. No acute fracture.      Impression    No acute intracranial abnormality.     CT CERVICAL SPINE WO IV CONTRAST     Status: None (Preliminary result)    Narrative    Keo N Carnero  Male, 34 years old.    CT CERVICAL SPINE WO IV CONTRAST performed on 01/22/2019 7:16 PM.    REASON FOR EXAM:  Neck Trauma    RADIATION DOSE: 679.05 mGy.cm    TECHNIQUE: CT cervical spine without IV contrast    COMPARISON: CT cervical spine without IV contrast dated 09/09/2013    FINDINGS:  No acute fracture or malalignment of the cervical spine.  Vertebral body and  intervertebral disc space heights are maintained. No  cervical listhesis. No neural foraminal or spinal canal stenosis.  Prevertebral soft tissues are unremarkable. Visualized portion of the  bilateral lung apices are clear.      Impression    No acute osseous abnormality of the cervical spine.     CT CHEST W IV CONTRAST     Status: None (Preliminary result)    Narrative    Freddi N Wolfson  Male, 34 years old.    CT CHEST W IV CONTRAST performed on 01/22/2019 7:17 PM.    REASON FOR EXAM:  trauma    RADIATION DOSE: 263.56 mGy.cm    CONTRAST: 80 ml's of Isovue 300    TECHNIQUE: CT chest with IV contrast    COMPARISON: CT trauma chest abdomen pelvis dated 09/09/2013    FINDINGS:     No focal consolidation, pleural effusion, or pneumothorax. The central  airways are patent. The heart is normal in size. The pulmonary arteries and  thoracic aorta are of normal caliber. No suspicious pulmonary nodules. No  mediastinal or perihilar lymphadenopathy. No acute soft tissue or osseous  abnormality. The esophagus is mostly decompressed limiting evaluation for  wall thickening and mass. No acute abnormality visualized upper abdomen.      Impression    No acute abnormality of the chest.             Incidental findings: No    Patient Management:   Procedures: none    Consults Ordered   No orders of the defined types were placed in this  encounter.      Assessment & Plan/Recommendations:      Active Hospital Problems   (*Primary Problem)    Diagnosis   . Fall (on) (from) other stairs and steps, initial encounter   . Cocaine abuse ([facility])       Plan:  Discharge to home  without follow-up in TES clinic   No acute traumatic findings on imaging     Reggie Pile, MD   Late entry for 01/22/19.  I personally saw and examined the patient.  I reviewed the resident's note.  I agree with the findings and plan of care as documented in the resident's note.  Any exceptions/additions are edited/noted.     Janora Norlander, MD 01/23/2019,  07:17

## 2019-01-22 NOTE — ED Provider Notes (Signed)
[facility] Emergency Department    Identification:  Name: [name]  Age and Gender: 34 y.o. male  Date of Birth: 26-Dec-1985  Date of Admission: 01/22/2019  MRN: [id number]  PCP: No Pcp  Attending: Maryan Puls, MD       HPI:  Trauma Level Alert: P2  Arrival Time: 27  Arriving via: Auto  Arriving from: Scene    Pre-Hospital Report:  Time of Injury: just PTA   Patient Condition: Stable  GCS: E4=Spontaneous (Opens Eyes on Own) M6=Normal (Follows Simple Commands) V5=Normal Conversation= [15]    Intubated: No   Fluids Received in Route: No fluids recieved  C-Collar: No  Back Board: No  Loss of Consciousness: Yes  > 5 minutes    Mechanism of Injury:   Fall  down 5 steps     Arrival to [place]:  Information Obtained from: patient  Chief Complaint: Neck and back pain   Additional HPI Details:   [name] is a 34 y.o. White male presenting as P2 trauma, via Auto, s/p fall down 5 steps. Pt did strike his head during the fall and the patient's girlfriend states that the patient had 1.5 hours of LOC. He was reportedly in and out of consciousness 5 times en route. Upon arrival to the ED, pt complains of back and neck pain. He has a Hx of Cocaine and Methamphetamine use. Otherwise, no other complaints or concerns reported at this time.   Tetanus status: Unknown  Anticoagulant use: none  Drug use: Cocaine and Methamphetamine use  Allergies:   Allergies   Allergen Reactions   . Penicillins      Last meal: Not reported    ROS:   Constitutional: No fever, chills or weakness   Skin: No rash or diaphoresis  HENT: No headaches or congestion +neck pain   Eyes: No vision changes   Cardio: No chest pain, palpitations or leg swelling   Respiratory: No cough, wheezing or SOB  GI:  No nausea, vomiting or stool changes  GU:  No urinary changes  MSK: No joint pain +back pain   Neuro: No seizures or LOC  Psychiatric: No depression, SI  +substance abuse   All other systems reviewed and  are negative.      History:  Reviewed via patient/patient's family, healthcare provider, EMR:  Medications Prior to Admission     Prescriptions    Ibuprofen (MOTRIN) 600 mg Oral Tablet    Take 600 mg by mouth Four times a day as needed for Pain    naloxone (NARCAN) 4 mg/actuation Nasal Spray, Non-Aerosol    1 Spray by INTRANASAL route Every 2 minutes as needed        Past Medical History:   Diagnosis Date   . Depression      Past Surgical History:   Procedure Laterality Date   . HX HAND SURGERY       Family Medical History:     Problem Relation (Age of Onset)    Cancer Mother, Sister    Hypertension (High Blood Pressure) Father        Social History     Socioeconomic History   . Marital status: Single     Spouse name: Not on file   . Number of children: Not on file   . Years of education: Not on file   . Highest education level: Not on file   Occupational History   . Not on file  Social Needs   . Financial resource strain: Not on file   . Food insecurity     Worry: Not on file     Inability: Not on file   . Transportation needs     Medical: Not on file     Non-medical: Not on file   Tobacco Use   . Smoking status: Current Every Day Smoker     Packs/day: 1.00     Types: Cigarettes   . Smokeless tobacco: Never Used   Substance and Sexual Activity   . Alcohol use: No     Comment: denies   . Drug use: Yes     Types: Cocaine   . Sexual activity: Not on file   Lifestyle   . Physical activity     Days per week: Not on file     Minutes per session: Not on file   . Stress: Not on file   Relationships   . Social Product manager on phone: Not on file     Gets together: Not on file     Attends religious service: Not on file     Active member of club or organization: Not on file     Attends meetings of clubs or organizations: Not on file     Relationship status: Not on file   . Intimate partner violence     Fear of current or ex partner: Not on file     Emotionally abused: Not on file     Physically abused: Not on file      Forced sexual activity: Not on file   Other Topics Concern   . Abuse/Domestic Violence Not Asked   . Caffeine Concern Not Asked   . Calcium intake adequate Not Asked   . Computer Use Not Asked   . Drives Not Asked   . Exercise Concern Not Asked   . Helmet Use Not Asked   . Seat Belt Not Asked   . Special Diet Not Asked   . Sunscreen used Not Asked   . Uses Cane Not Asked   . Uses walker Not Asked   . Uses wheelchair Not Asked   . Right hand dominant Not Asked   . Left hand dominant Not Asked   . Ambidextrous Not Asked   . Shift Work Not Asked   . Unusual Sleep-Wake Schedule Not Asked   Social History Narrative   . Not on file    Physical Exam:  Vitals:   ED Triage Vitals [01/22/19 1828]   BP (Non-Invasive) (!) 141/82   Heart Rate 91   Respiratory Rate 18   Temperature 35.9 C (96.6 F)   SpO2 99 %   Weight 86 kg (189 lb 9.5 oz)   Height 1.753 m ('5\' 9"' )     Constitutional: GCS - 15. Awake and alert. NAD.  HENT:   Head: No external signs of trauma  Mouth/Throat: Midface stable. No malocclusion.  Eyes: EOMI. Pupils are round and reactive bilaterally.   Ears: TMs are intact bilaterally. No hematomas.  Nose: No nasal septal hematoma. No gross deformity.  Neck: C-collar in place. Pt has midline C-spine tenderness. No step-off or deformity.  Cardiovascular: RRR. Pulses present in all 4 extremity.   Pulmonary/Chest:  BS equal bilaterally. no tenderness or ecchymosis.  Abdomen: No tenderness. Soft, non-distended.     Musculoskeletal:   Pelvis: No instability  Back: Pt has midline thoracic spine tenderness. No step-off or deformities.   Extremities:  No gross deformities. No TTP  GU: Rectal deferred.   Skin: No laceration. No abrasion.  Neuro: No focal neurological deficits. GCS as above.  Psych: Normal mood and affect. Pt is anxious appearing.   Nursing notes/chart reviewed.      Radiology:  Imaging reviewed.  Incidental findings:   FAST: Negative   CT CHEST W IV CONTRAST   Final Result   No acute abnormality of the chest.               CT CERVICAL SPINE WO IV CONTRAST   Final Result   No acute osseous abnormality of the cervical spine.      CT BRAIN WO IV CONTRAST   Final Result   No acute intracranial abnormality.      XR PELVIS   Final Result   No acute osseous abnormality of the visualized pelvis.      XR CHEST AP MOBILE   Final Result   No acute cardiopulmonary process.      ED [place] FAST EXAM                  (Results Pending)       Labs:  Labs Reviewed   VENOUS BLOOD GAS/LACTATE/CO-OX/LYTES (NA/K/CA/CL/GLUC) - Abnormal; Notable for the following components:       Result Value    PH (VENOUS) 7.62 (*)     PCO2 (VENOUS) 20.00 (*)     PO2 (VENOUS) 81.0 (*)     SODIUM 134 (*)     WHOLE BLOOD POTASSIUM 2.9 (*)     IONIZED CALCIUM 1.07 (*)     LACTATE 1.4 (*)     OXYHEMOGLOBIN 92.9 (*)     O2CT 19.9 (*)     All other components within normal limits   BASIC METABOLIC PANEL - Abnormal; Notable for the following components:    POTASSIUM 3.2 (*)     CO2 TOTAL 19 (*)     ANION GAP 14 (*)     BUN 27 (*)     BUN/CREA RATIO 28 (*)     All other components within normal limits    Narrative:     Hemolysis can alter results at this level (slight).  Estimated Glomerular Filtration Rate (eGFR) calculated using the CKD-EPI (2009) equation, intended for patients 41 years of age and older. If race and/or gender is not documented or "unknown," there will be no eGFR calculation.   CBC WITH DIFF - Abnormal; Notable for the following components:    WBC 12.0 (*)     MONOCYTE # 1.40 (*)     All other components within normal limits   VENOUS BLOOD GAS/LACTATE - Abnormal; Notable for the following components:    PH (VENOUS) 7.45 (*)     PCO2 (VENOUS) 35.00 (*)     PO2 (VENOUS) 64.0 (*)     All other components within normal limits   PT/INR - Normal    Narrative:     Coumadin therapy INR range for Conventional Anticoagulation is 2.0 to 3.0 and for Intensive Anticoagulation 2.5 to 3.5.   PTT (PARTIAL THROMBOPLASTIN TIME) - Normal    Narrative:     Therapeutic range  for unfractionated heparin is 60-100 seconds.   CBC/DIFF    Narrative:     The following orders were created for panel order CBC/DIFF.  Procedure  Abnormality         Status                     ---------                               -----------         ------                     CBC WITH TKWI[[id number]]                Abnormal            Final result                 Please view results for these tests on the individual orders.   ETHANOL, SERUM   URINALYSIS WITH MICROSCOPIC REFLEX IF INDICATED    Narrative:     The following orders were created for panel order URINALYSIS WITH MICROSCOPIC REFLEX IF INDICATED.  Procedure                               Abnormality         Status                     ---------                               -----------         ------                     URINALYSIS, MACRO/MICRO[[id number]]                                                       Please view results for these tests on the individual orders.   DRUG SCREEN, NO CONFIRMATION, URINE   URINALYSIS, MACRO/MICRO   TYPE AND SCREEN     All labs were reviewed.  Medical Records reviewed.    Orders:  Results up to the Time the Disposition was Entered   VENOUS BLOOD GAS/LACTATE/CO-OX/LYTES (NA/K/CA/CL/GLUC) - Abnormal; Notable for the following components:       Result Value    PH (VENOUS) 7.62 (*)     PCO2 (VENOUS) 20.00 (*)     PO2 (VENOUS) 81.0 (*)     SODIUM 134 (*)     WHOLE BLOOD POTASSIUM 2.9 (*)     IONIZED CALCIUM 1.07 (*)     LACTATE 1.4 (*)     OXYHEMOGLOBIN 92.9 (*)     O2CT 19.9 (*)     All other components within normal limits   BASIC METABOLIC PANEL - Abnormal; Notable for the following components:    POTASSIUM 3.2 (*)     CO2 TOTAL 19 (*)     ANION GAP 14 (*)     BUN 27 (*)     BUN/CREA RATIO 28 (*)     All other components within normal limits    Narrative:     Hemolysis can alter results at this level (slight).  Estimated Glomerular Filtration Rate (eGFR) calculated using the CKD-EPI (2009)  equation, intended for patients 68 years of age and older. If race and/or gender is not documented or "unknown," there will be no eGFR calculation.   CBC WITH DIFF - Abnormal; Notable for the following components:    WBC 12.0 (*)     MONOCYTE # 1.40 (*)     All other components within normal limits   VENOUS BLOOD GAS/LACTATE - Abnormal; Notable for the following components:    PH (VENOUS) 7.45 (*)     PCO2 (VENOUS) 35.00 (*)     PO2 (VENOUS) 64.0 (*)     All other components within normal limits   PT/INR - Normal    Narrative:     Coumadin therapy INR range for Conventional Anticoagulation is 2.0 to 3.0 and for Intensive Anticoagulation 2.5 to 3.5.   PTT (PARTIAL THROMBOPLASTIN TIME) - Normal    Narrative:     Therapeutic range for unfractionated heparin is 60-100 seconds.   CBC/DIFF    Narrative:     The following orders were created for panel order CBC/DIFF.  Procedure                               Abnormality         Status                     ---------                               -----------         ------                     CBC WITH JOIN[[id number]]                Abnormal            Final result                 Please view results for these tests on the individual orders.   ETHANOL, SERUM   TYPE AND SCREEN   ECG 12-LEAD   XR AP MOBILE CHEST    Narrative:     [name]  Male, 34 years old.    XR AP MOBILE CHEST performed on 01/22/2019 6:54 PM.    REASON FOR EXAM:  Chest Trauma    TECHNIQUE: 1 views/1 images submitted for interpretation.    COMPARISON:  Chest radiograph dated 09/09/2013    FINDINGS:  The cardiomediastinal silhouette is within normal limits of  size. The right costophrenic angle is excluded from view. No evidence of  focal consolidation, pneumothorax, or pleural effusion.     XR PELVIS    Narrative:     [name]  Male, 34 years old.    XR PELVIS performed on 01/22/2019 6:54 PM.    REASON FOR EXAM:  Pelvic Injury    TECHNIQUE: 1 views/1 images submitted for  interpretation.    COMPARISON:  CT abdomen pelvis dated 10/19/2018    FINDINGS:  No acute fracture or malalignment of the visualized pelvis. The  bilateral upper iliac crests are excluded from view. The sacroiliac joints  are limited in evaluation secondary to overlying bowel gas. No pubic  symphysis diastases. Bilateral femoroacetabular joint spaces are overall  maintained.     CT CHEST W IV CONTRAST    Narrative:     [name]  Male, 34 years old.    CT CHEST W IV CONTRAST performed on 01/22/2019 7:17 PM.    REASON FOR EXAM:  trauma    RADIATION DOSE: 263.56 mGy.cm    CONTRAST: 80 ml's of Isovue 300    TECHNIQUE: CT chest with IV contrast    COMPARISON: CT trauma chest abdomen pelvis dated 09/09/2013    FINDINGS:     No focal consolidation, pleural effusion, or pneumothorax. The central  airways are patent. The heart is normal in size. The pulmonary arteries and  thoracic aorta are of normal caliber. No suspicious pulmonary nodules. No  mediastinal or perihilar lymphadenopathy. No acute soft tissue or osseous  abnormality. The esophagus is mostly decompressed limiting evaluation for  wall thickening and mass. No acute abnormality visualized upper abdomen.     CT BRAIN WO IV CONTRAST    Narrative:     [name]  Male, 34 years old.    CT BRAIN WO IV CONTRAST performed on 01/22/2019 7:15 PM.    REASON FOR EXAM:  Head Trauma    RADIATION DOSE: 1106.22 mGy.cm    TECHNIQUE: CT brain without IV contrast    COMPARISON: CT brain without IV contrast dated 09/09/2013    FINDINGS:  No acute hemorrhage, major vessel vascular territory infarct, or  extra-axial fluid collection. Ventricles are stable in size and  configuration. Basal cisterns are patent. Bilateral maxillary sinus  sentences. The radial visualized sinuses are well-aerated. Mastoid air  cells are well-aerated. No acute fracture.     CT CERVICAL SPINE WO IV CONTRAST    Narrative:     [name]  Male, 34 years old.    CT CERVICAL SPINE WO  IV CONTRAST performed on 01/22/2019 7:16 PM.    REASON FOR EXAM:  Neck Trauma    RADIATION DOSE: 679.05 mGy.cm    TECHNIQUE: CT cervical spine without IV contrast    COMPARISON: CT cervical spine without IV contrast dated 09/09/2013    FINDINGS:  No acute fracture or malalignment of the cervical spine.  Vertebral body and intervertebral disc space heights are maintained. No  cervical listhesis. No neural foraminal or spinal canal stenosis.  Prevertebral soft tissues are unremarkable. Visualized portion of the  bilateral lung apices are clear.     ED [place] FAST EXAM   DIET NPO - NOW   VITAL SIGNS MULTI FREQUENCIES ED   NEURO CHECKS MULTI-FREQUENCY   CONTINUOUS CARDIAC MONITORING (ED USE ONLY)   PULSE OXIMETRY   INSERT & MAINTAIN PERIPHERAL IV ACCESS   DRESSING CHANGE   URINALYSIS WITH MICROSCOPIC REFLEX IF INDICATED    Narrative:     The following orders were created for panel order URINALYSIS WITH MICROSCOPIC REFLEX IF INDICATED.  Procedure                               Abnormality         Status                     ---------                               -----------         ------                     URINALYSIS, QQVZD/GLOVF[[id number]]  Please view results for these tests on the individual orders.   DRUG SCREEN, NO CONFIRMATION, URINE   URINALYSIS, MACRO/MICRO   TRAUMA ATTENDING NOTIFIED   NS flush syringe (has no administration in time range)     And   NS flush syringe (has no administration in time range)   NS premix infusion (has no administration in time range)   iopamidol (ISOVUE-300) 61% infusion (80 mL Intravenous Given 01/22/19 1908)   LORazepam (ATIVAN) 2 mg/mL injection (2 mg Intravenous Given 01/22/19 1937)       MDM/Course:   Patient was paged as a priority P2 trauma.    Trauma team was present upon arrival of patient to the ED. Primary, secondary, and tertiary surveys were performed and appropriate imaging was ordered. During primary, secondary, tertiary  surveys the following therapies were instituted:      Upon arrival patient no acute distress breathing easily on room air   Reportedly fell down 5 steps hitting the back of his neck   Complains of neck pain   Reports he has used meth and cocaine this morning   Patient appears extremely anxious   Routine lab work reveals alkalotic pH likely due to his tachypnea   Given Ativan with improvement of his pH to 7.45   Chest x-ray unremarkable   Pelvic x-ray unremarkable   CT head and C-spine negative   Upon re-evaluation, patient is has no midline cervical tenderness   Patient cleared by Trauma Service for discharge   Patient discharge instructions to return to ED with any new worsening symptoms    Patient was transported to radiology. Patient remained stable while in the ED, labs, and imaging were reviewed and the patient was admitted for further treatment and monitoring.    Impression:  Encounter Diagnoses   Name Primary?   . Head injury Yes   . Fall        Medications Given:  Medications   NS flush syringe (has no administration in time range)     And   NS flush syringe (has no administration in time range)   NS premix infusion (has no administration in time range)   iopamidol (ISOVUE-300) 61% infusion (80 mL Intravenous Given 01/22/19 1908)   LORazepam (ATIVAN) 2 mg/mL injection (2 mg Intravenous Given 01/22/19 1937)     Following the above history, physical exam, and studies, the patient was deemed stable and suitable for discharge. Medication instructions were discussed with the patient/patient's family. Patient/patient's family was advised to return to the ED with any new, concerning or worsening symptoms and follow up as directed. The patient/patient's family verbalized understanding of all instructions and had no further questions or concerns.     I am scribing for, and in the presence of, Alecia Lemming, MD, for services provided on 01/22/2019.   //Seth Marene Lenz,  01/22/2019 7:01 PM    I personally  performed the services described in this documentation, as scribed  in my presence, and it is both accurate  and complete.    This note was partially generated using MModal Fluency Direct system, and there may be some incorrect words, spellings, and punctuation that were not noted in checking the note before saving.     Alecia Lemming, MD 01/22/2019, 18:56   PGY-1 Emergency Medicine  [facility]

## 2019-01-22 NOTE — Discharge Instructions (Addendum)
Follow up with your primary care doctor. Please return to the ED for any new or worsening symptoms.

## 2019-01-30 NOTE — ED Attending Note (Addendum)
Emergency Department  Attending Note    Name: [name]  Age and Gender: 34 y.o. male  PCP: No Pcp      Triage Note:   Head Injury (head injury , states he slipped on step , hit head on top step , , states he thinks he was knocked out , no lacerations , neck pain , c -collar placed in triage,  headache )        HPI:  I was physically present and directly supervised this patients care. Patient seen and examined with the resident/MLP, Dr. [name], and history and exam reviewed. Key elements in addition to and/or correction of that documentation are as follows:    The patient presents S priority 2 trauma, fall down 5 stairs.  He reportedly had loss of consciousness for approximately 1 hour, complains of neck pain.  For his friend at bedside, he had waxing and waning mental status which prompted them to come to the emergency department for further evaluation.  He has an extensive history of drug abuse in the past.  He had meth use last week, cocaine use today.  No recent fevers, chills, nausea, vomiting.  No shortness of breath.       Objective:  ED Triage Vitals [01/22/19 1828]   BP (Non-Invasive) (!) 141/82   Heart Rate 91   Respiratory Rate 18   Temperature 35.9 C (96.6 F)   SpO2 99 %   Weight 86 kg (189 lb 9.5 oz)   Height 1.753 m (_0 )     Filed Vitals:    01/22/19 1828   BP: (!) 141/82   Pulse: 91   Resp: 18   Temp: 35.9 C (96.6 F)   SpO2: 99%       Pertinent Physical Exam  Agree with primary note    See ED Primary Note for full physical exam.     Labs:   Labs Reviewed   VENOUS BLOOD GAS/LACTATE/CO-OX/LYTES (NA/K/CA/CL/GLUC) - Abnormal; Notable for the following components:       Result Value    PH (VENOUS) 7.62 (*)     PCO2 (VENOUS) 20.00 (*)     PO2 (VENOUS) 81.0 (*)     SODIUM 134 (*)     WHOLE BLOOD POTASSIUM 2.9 (*)     IONIZED CALCIUM 1.07 (*)     LACTATE 1.4 (*)     OXYHEMOGLOBIN 92.9 (*)     O2CT 19.9 (*)     All other components within normal limits   BASIC METABOLIC PANEL -  Abnormal; Notable for the following components:    POTASSIUM 3.2 (*)     CO2 TOTAL 19 (*)     ANION GAP 14 (*)     BUN 27 (*)     BUN/CREA RATIO 28 (*)     All other components within normal limits    Narrative:     Hemolysis can alter results at this level (slight).  Estimated Glomerular Filtration Rate (eGFR) calculated using the CKD-EPI (2009) equation, intended for patients 68 years of age and older. If race and/or gender is not documented or "unknown," there will be no eGFR calculation.   CBC WITH DIFF - Abnormal; Notable for the following components:    WBC 12.0 (*)     MONOCYTE # 1.40 (*)     All other components within normal limits   VENOUS BLOOD GAS/LACTATE - Abnormal; Notable for the following components:    PH (VENOUS) 7.45 (*)  PCO2 (VENOUS) 35.00 (*)     PO2 (VENOUS) 64.0 (*)     All other components within normal limits   PT/INR - Normal    Narrative:     Coumadin therapy INR range for Conventional Anticoagulation is 2.0 to 3.0 and for Intensive Anticoagulation 2.5 to 3.5.   PTT (PARTIAL THROMBOPLASTIN TIME) - Normal    Narrative:     Therapeutic range for unfractionated heparin is 60-100 seconds.   CBC/DIFF    Narrative:     The following orders were created for panel order CBC/DIFF.  Procedure                               Abnormality         Status                     ---------                               -----------         ------                     CBC WITH FTDD[[id number]]                Abnormal            Final result                 Please view results for these tests on the individual orders.   ETHANOL, SERUM   URINALYSIS WITH MICROSCOPIC REFLEX IF INDICATED    Narrative:     The following orders were created for panel order URINALYSIS WITH MICROSCOPIC REFLEX IF INDICATED.  Procedure                               Abnormality         Status                     ---------                               -----------         ------                     URINALYSIS, MACRO/MICRO[[id number]]                                                        Please view results for these tests on the individual orders.   DRUG SCREEN, NO CONFIRMATION, URINE   URINALYSIS, MACRO/MICRO   TYPE AND SCREEN       Imaging:  CT CHEST W IV CONTRAST   Final Result by Edi, Radresults In (01/18 2022)   No acute abnormality of the chest.            CT CERVICAL SPINE WO IV CONTRAST   Final Result by Edi, Radresults In (01/18 2010)   No acute osseous abnormality of the cervical spine.      CT BRAIN WO IV CONTRAST   Final Result  by Edi, Radresults In (01/18 2009)   No acute intracranial abnormality.      XR PELVIS   Final Result by Edi, Radresults In (01/18 1955)   No acute osseous abnormality of the visualized pelvis.      XR CHEST AP MOBILE   Final Result by Edi, Radresults In (01/18 1955)   No acute cardiopulmonary process.            Nursing notes, labs, and imaging reviewed.      Clinical Impression:     Encounter Diagnoses   Name Primary?   . Head injury Yes   . Fall          Course and MDM:      34 y.o. male P 2 trauma fall downstairs   Concern for intracranial hemorrhage versus fracture versus concussion   Trauma team present on arrival.   Airway/breathing/circulation intact.   Fast exam negative.   Patient to CT scanner for CT head and cervical spine.   CT head and C-spine are unremarkable.   C-collar was cleared.   Patient able to ambulate tolerate p.o..  Patient vitally stable, safe for discharge home  All questions answered, return precautions discussed  Patient discharged home      Critical Care: None.      Maryan Puls, MD  01/30/2019, 03:46        Parts of this patient's chart were completed in a retrospective fashion due to simultaneous direct patient care activities in the Emergency Department.

## 2019-02-08 ENCOUNTER — Ambulatory Visit (HOSPITAL_COMMUNITY): Payer: Self-pay

## 2019-02-08 NOTE — Telephone Encounter (Addendum)
Case manager called patient to schedule an intake appointment but patient did not answer.  Case manager left message asking patient to call in to schedule an appointment.  02/08/2019  Corley. Judeth Cornfield, CASE MANAGER    Corley. Judeth Cornfield, CASE MANAGER  02/08/2019, 16:17      Regarding: appointment  ----- Message from Holly Bodily sent at 02/08/2019  9:51 AM EST -----  Judeth Cornfield     The patient called needing to reschedule his Jan 15th appointment that he missed.   He has been out of his medications for about two weeks.  Please advise the patient at [phone].   Thank you

## 2019-02-14 ENCOUNTER — Ambulatory Visit (INDEPENDENT_AMBULATORY_CARE_PROVIDER_SITE_OTHER): Payer: 59 | Admitting: Family

## 2019-02-25 ENCOUNTER — Other Ambulatory Visit: Payer: Self-pay | Admitting: Family Medicine

## 2019-02-25 DIAGNOSIS — I152 Hypertension secondary to endocrine disorders: Secondary | ICD-10-CM

## 2019-02-25 DIAGNOSIS — E119 Type 2 diabetes mellitus without complications: Secondary | ICD-10-CM

## 2019-02-25 DIAGNOSIS — E1159 Type 2 diabetes mellitus with other circulatory complications: Secondary | ICD-10-CM

## 2019-02-26 ENCOUNTER — Encounter: Payer: Self-pay | Admitting: Family Medicine

## 2019-02-26 ENCOUNTER — Ambulatory Visit (INDEPENDENT_AMBULATORY_CARE_PROVIDER_SITE_OTHER): Payer: BC Managed Care – PPO | Admitting: Family Medicine

## 2019-02-26 ENCOUNTER — Other Ambulatory Visit: Payer: Self-pay

## 2019-02-26 VITALS — BP 140/90 | HR 85 | Temp 97.5°F | Wt 279.4 lb

## 2019-02-26 DIAGNOSIS — I10 Essential (primary) hypertension: Secondary | ICD-10-CM

## 2019-02-26 DIAGNOSIS — E1159 Type 2 diabetes mellitus with other circulatory complications: Secondary | ICD-10-CM | POA: Diagnosis not present

## 2019-02-26 DIAGNOSIS — G4733 Obstructive sleep apnea (adult) (pediatric): Secondary | ICD-10-CM

## 2019-02-26 DIAGNOSIS — I152 Hypertension secondary to endocrine disorders: Secondary | ICD-10-CM

## 2019-02-26 NOTE — Progress Notes (Signed)
   Subjective:    Patient ID: Jesse Duncan, male    DOB: 11/19/1985, 33 y.o.   MRN: [id number]  HPI He is here for a blood pressure recheck.  He has had no difficulty with the medications.  He is also made some dietary changes and has lost a significant amount of weight.  He also has a history of OSA but has not been using his CPAP as often as he should due to difficulty with gastric gas from the CPAP.  He has not had this adjusted in several years.  He has not had a readout in quite some time as well.   Review of Systems     Objective:   Physical Exam Alert and in no distress.  Blood pressure is recorded.      Assessment & Plan:  OSA (obstructive sleep apnea)  Morbid obesity (HCC)  Hypertension associated with diabetes (HCC) I explained that his blood pressure is much better however he has lost a significant amount of weight.  I congratulated him on this.  I explained that this can also affect his blood pressure as well as diabetes and OSA.  He is to talk to the DME provider to see if he can get adjusted for auto titrate 5-15.  Also discussed possibly getting a different mask.  Plan to recheck all this in several months.

## 2019-03-08 LAB — HM DIABETES EYE EXAM

## 2019-03-13 ENCOUNTER — Emergency Department: Admission: EM | Admit: 2019-03-13 | Discharge: 2019-03-13 | Payer: 59

## 2019-03-13 ENCOUNTER — Emergency Department (HOSPITAL_COMMUNITY): Payer: 59

## 2019-03-13 ENCOUNTER — Other Ambulatory Visit: Payer: Self-pay

## 2019-03-13 ENCOUNTER — Encounter (HOSPITAL_COMMUNITY): Payer: Self-pay

## 2019-03-13 DIAGNOSIS — Z0289 Encounter for other administrative examinations: Secondary | ICD-10-CM | POA: Insufficient documentation

## 2019-03-13 DIAGNOSIS — F17210 Nicotine dependence, cigarettes, uncomplicated: Secondary | ICD-10-CM | POA: Insufficient documentation

## 2019-03-13 DIAGNOSIS — M25511 Pain in right shoulder: Secondary | ICD-10-CM | POA: Insufficient documentation

## 2019-03-13 DIAGNOSIS — M85811 Other specified disorders of bone density and structure, right shoulder: Secondary | ICD-10-CM | POA: Insufficient documentation

## 2019-03-13 DIAGNOSIS — Z008 Encounter for other general examination: Secondary | ICD-10-CM

## 2019-03-13 NOTE — Discharge Instructions (Signed)
Old fractures noted on xray with no acute findings  Patient is medically clear for incarceration

## 2019-03-13 NOTE — ED Provider Notes (Signed)
Department of Emergency Medicine           DOS: 03/13/19      Chief Complaint:  Patient presents with     Chief Complaint   Patient presents with   . Medical Problem     pt needing medical clearance for jail, pt also needing XR on R shoulder d/t old injury       HPI    [name] is a 34 y.o. male who presents to the Emergency Department for medical clearance for incarceration. Police currently at bedside. Police state that patient needs imaging of right shoulder to be cleared for incarceration - already at [facility]. Patient states that he is having right shoulder pain - reports a previous fracture several years ago that was aggravated a few days ago. 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 Reports feeling a pull in his shoulder - unsure if he dislocated, broke or strained. Denies any numbness or tingling distal. Offers no other complaints.     Past Medical History:  Past Medical History:   Diagnosis Date   . Depression        Past Surgical History:  Past Surgical History:   Procedure Laterality Date   . Hx hand surgery         Family History:   Family History   Problem Relation Age of Onset   . Cancer Mother    . Hypertension (High Blood Pressure) Father    . Cancer Sister    . Anesthesia Complications Neg Hx        Social History:  Social History     Tobacco Use   . Smoking status: Current Every Day Smoker     Packs/day: 1.00     Types: Cigarettes   . Smokeless tobacco: Never Used   Vaping Use   . Vaping Use: Never used   Substance Use Topics   . Alcohol use: No     Comment: denies   . Drug use: Yes     Types: Cocaine       Medications:  Medications Prior to Admission     Prescriptions    Ibuprofen (MOTRIN) 600 mg Oral Tablet    Take 600 mg by mouth Four times a day as needed for Pain    naloxone (NARCAN) 4 mg/actuation Nasal Spray, Non-Aerosol    1 Spray by INTRANASAL route Every 2 minutes as needed          Allergy:  Allergies      Allergen Reactions   . Penicillins        Review of Systems  Review of Systems   Constitutional: Negative for chills and fever.   HENT: Negative for congestion, rhinorrhea and sore throat.    Eyes: Negative for discharge and visual disturbance.   Respiratory: Negative for cough and shortness of breath.    Cardiovascular: Negative for chest pain and leg swelling.   Gastrointestinal: Negative for abdominal pain, constipation, diarrhea, nausea and vomiting.   Genitourinary: Negative for dysuria, frequency, hematuria and urgency.   Musculoskeletal: Negative for back pain and joint swelling.        Right shoulder pain   Skin: Negative for color change and rash.   Neurological: Negative for weakness and headaches.   All other systems reviewed and are negative.      Vitals:  Filed Vitals:    03/13/19 0149 03/13/19 0151  BP:  (!) 148/88   Pulse: 90    Resp: 20    Temp: 35.5 C (95.9 F)    SpO2: 100%        PHYSICAL EXAM:    Vital signs reviewed.  Nursing note reviewed.  Constitutional: alert, no acute distress, no obvious discomfort  HEENT:      Head: Normocephalic and atraumatic.       Mouth: moist mucous membranes. No pharyngeal erythema  Cardiovascular: Normal rate, regular rhythm. (+)S1/S2,  No murmurs, gallops or rubs  Pulmonary/Chest: Clear to auscultate bilaterally, no wheezing rales or rhonchi.  Abdominal: Soft. Bowel sounds are normal. Nontender. Nondistended. No rebound or guarding  Upper Extremity: normal to inspection, tenderness over right distal clavicle and shoulder. Decreased ROM 2nd to pain. (+) radial pulses  Lower Extremity: normal to inspection, no clubbing, cyanosis or edema, (+) pedal pulses bilateral  Neurological: alert and oriented to person, place, and time. No focal motor or sensory deficits. Gait is normal.   Skin:   Warm, dry, well perfused, no rash. Multiple tattoo   Psychiatric:  normal mood and affect.     Old records reviewed by me:    Radiology:  Results for orders placed or performed  during the hospital encounter of 03/13/19   XR SHOULDER RIGHT     Status: None    Narrative    [name]    PROCEDURE DESCRIPTION: XR SHOULDER RIGHT    CLINICAL INDICATION: right shoulder pain    TECHNIQUE: 3 views / 3 images submitted.    COMPARISON: 01/22/2019      FINDINGS: 3 views of the right shoulder show a curvilinear bony density  seen adjacent to the inferior aspect of the distal head of the clavicle of  uncertain etiology. This may represent a fracture of the distal head of the  clavicle. In retrospect this was present on the prior CT chest exam dated  01/22/2019 so it does not represent an acute fracture. Clinical correlation  is recommended for history of trauma. Its also possible that this  represents soft tissue calcification but I believe this is less likely. The  bones of the shoulder otherwise appear normal.      Impression    Old ununited fracture of the distal head of the clavicle is  suspected as discussed above. Clinical correlation is recommended for  history of trauma and point tenderness.            Radiologist location ID: [id number]     XR CLAVICLE RIGHT     Status: None    Narrative    [name]    PROCEDURE DESCRIPTION: XR CLAVICLE RIGHT    CLINICAL INDICATION: right shoulder pain    TECHNIQUE: 2 views / 2 images submitted.    COMPARISON: No prior studies were compared.      FINDINGS: 2 views of the right clavicle show a curvilinear density adjacent  to the distal end of the clavicle as described on the exam of the shoulder.  Question old fracture versus soft tissue calcification. The remainder of  the clavicle appears normal.      Impression    Question old fracture versus soft tissue calcification adjacent  to the distal aspect of the clavicle. Otherwise negative exam.            Radiologist location ID: [facility]       Radiology images and final radiologist report reviewed by me.    Medications - No data to  display    ED Course  Medical Decision Making:  The above   tests, imaging and medications were ordered to investigate/rule out and treat the medical conditions to the best of our ability while pt is in the emergency department.  Vitals were monitored and pt was reassessed with pertinent findings noted below:      . Results and plan reviewed with patient  . Patient agreed with discharge, treatment plan outpatient follow-up  . Patient is directed  to return to ER for any concerns, new or worsening symptoms.  . Patient was given opportunity to ask questions prior to discharge    Disposition:   Discharged      Encounter Diagnoses   Name Primary?   . Right shoulder pain Yes   . Medical clearance for incarceration         Virgel Manifold, DO  [address] DO, [place], [place]  03/13/2019  04:51

## 2019-06-28 ENCOUNTER — Telehealth: Payer: Self-pay | Admitting: Family Medicine

## 2019-06-28 ENCOUNTER — Encounter: Payer: BC Managed Care – PPO | Admitting: Family Medicine

## 2019-06-28 NOTE — Telephone Encounter (Signed)
Dismissal letter in guarantor snapshot  °

## 2019-06-29 ENCOUNTER — Other Ambulatory Visit: Payer: Self-pay | Admitting: Family Medicine

## 2019-06-29 DIAGNOSIS — E1159 Type 2 diabetes mellitus with other circulatory complications: Secondary | ICD-10-CM

## 2019-06-29 DIAGNOSIS — E119 Type 2 diabetes mellitus without complications: Secondary | ICD-10-CM

## 2019-07-04 ENCOUNTER — Encounter: Payer: BC Managed Care – PPO | Admitting: Family Medicine

## 2019-07-04 ENCOUNTER — Other Ambulatory Visit: Payer: Self-pay

## 2019-07-04 ENCOUNTER — Telehealth: Payer: Self-pay | Admitting: Family Medicine

## 2019-07-04 DIAGNOSIS — E119 Type 2 diabetes mellitus without complications: Secondary | ICD-10-CM

## 2019-07-04 MED ORDER — TRULICITY 0.75 MG/0.5ML ~~LOC~~ SOAJ: 0.7500 mg | SUBCUTANEOUS | 5 refills | Status: DC

## 2019-07-04 NOTE — Telephone Encounter (Signed)
Pt called and is requesting a refill on his truliciy please send to the [id number] - [place], [place] - [address] Pt was not dismissed until 06/29/2019 please refill

## 2019-07-04 NOTE — Telephone Encounter (Signed)
Sent in [facility] 

## 2019-07-27 ENCOUNTER — Other Ambulatory Visit: Payer: Self-pay | Admitting: Family Medicine

## 2019-07-27 DIAGNOSIS — E119 Type 2 diabetes mellitus without complications: Secondary | ICD-10-CM

## 2019-08-07 ENCOUNTER — Other Ambulatory Visit: Payer: Self-pay | Admitting: Family Medicine

## 2019-08-07 DIAGNOSIS — E119 Type 2 diabetes mellitus without complications: Secondary | ICD-10-CM

## 2019-08-08 ENCOUNTER — Other Ambulatory Visit: Payer: Self-pay | Admitting: Family Medicine

## 2019-08-08 DIAGNOSIS — E119 Type 2 diabetes mellitus without complications: Secondary | ICD-10-CM

## 2019-11-04 NOTE — Progress Notes (Signed)
Cardiology Office Note:    Date:  11/05/2019   ID:  Jesse Duncan, DOB [birth year], MRN [id number]  PCP:  Patient, No Pcp Per  Cardiologist:  Lesleigh Noe, MD   Referring MD: No ref. provider found   Chief Complaint  Patient presents with  . Coronary Artery Disease  . Congestive Heart Failure  . Atrial Fibrillation    History of Present Illness:    Jesse Duncan is a 34 y.o. male with a hx of obstructive sleep apnea, tobacco use, paroxysmal atrial fibrillation requiring electrical cardioversion, hypertension, hyperlipidemia, DM II, obesity, chronic combined systolic and diastolic heart failure.   It has been a bit since I have seen Jesse Duncan. He is still working 2 jobs, one had [employer] and the other at [employer]. He gets between 4 and 5 hours of sleep per day. Not specifically compliant with CPAP. Having a difficult time staying on diet for diabetes and blood pressure control. He seems irritated by the fact that he has his multitude of medical problems. Did not know his father's medical history but heard that his father had chronic kidney disease blood pressure problems.  Past Medical History:  Diagnosis Date  . Allergic rhinitis due to pollen 07/06/2010  . Atrial fibrillation (HCC) 08/15/2015  . Diabetes mellitus without complication (HCC)   . Dyslipidemia   . Hyperlipidemia 05/25/2010  . Hypertension   . Impaired fasting blood sugar 08/15/2015  . Obesity   . OSA (obstructive sleep apnea) 10/20/2015  . Sleep apnea   . Smoker   . Tobacco use disorder 11/14/2013    History reviewed. No pertinent surgical history.  Current Medications: Current Meds  Medication Sig  . amLODipine (NORVASC) 10 MG tablet Take 1 tablet (10 mg total) by mouth daily.  Marland Kitchen aspirin EC 81 MG tablet Take 1 tablet (81 mg total) by mouth daily.  Marland Kitchen buPROPion (WELLBUTRIN XL) 150 MG 24 hr tablet Take 1 tablet (150 mg total) by mouth daily.  Marland Kitchen diltiazem (CARDIZEM CD) 240 MG 24 hr capsule TAKE 1  CAPSULE BY MOUTH EVERY DAY  . Dulaglutide (TRULICITY) 0.75 MG/0.5ML SOPN Inject 0.5 mLs (0.75 mg total) into the skin once a week.  . empagliflozin (JARDIANCE) 25 MG TABS tablet Take 25 mg by mouth daily before breakfast.  . glucose blood test strip Test twice a day. Pt uses one touch verio flex  . losartan-hydrochlorothiazide (HYZAAR) 100-25 MG tablet TAKE 1 TABLET BY MOUTH EVERY DAY  . metFORMIN (GLUCOPHAGE) 1000 MG tablet TAKE 1 TABLET (1,000 MG TOTAL) BY MOUTH 2 (TWO) TIMES DAILY WITH A MEAL.  Marland Kitchen ONETOUCH DELICA LANCETS FINE MISC Test twice a day. Pt uses one touch verio flex  . rosuvastatin (CRESTOR) 40 MG tablet Take 1 tablet (40 mg total) by mouth daily.  . sildenafil (VIAGRA) 100 MG tablet Take 1 tablet (100 mg total) by mouth daily as needed for erectile dysfunction.     Allergies:   Patient has no known allergies.   Social History   Socioeconomic History  . Marital status: Single    Spouse name: Not on file  . Number of children: Not on file  . Years of education: Not on file  . Highest education level: Not on file  Occupational History  . Not on file  Tobacco Use  . Smoking status: Light Tobacco Smoker    Years: 6.00    Types: Cigars, E-cigarettes    Last attempt to quit: 07/14/2015    Years since quitting: 4.3  .  Smokeless tobacco: Never Used  . Tobacco comment: sometimes smokes from a hooka pipe  Vaping Use  . Vaping Use: Some days  Substance and Sexual Activity  . Alcohol use: Yes    Alcohol/week: 1.0 standard drink    Types: 1 Standard drinks or equivalent per week    Comment: social   . Drug use: No  . Sexual activity: Not Currently  Other Topics Concern  . 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      Social Determinants of Health   Financial Resource Strain:   . Difficulty of Paying Living Expenses: Not on file  Food Insecurity:   . Worried About Programme researcher, broadcasting/film/video in the Last Year: Not on file  .  Ran Out of Food in the Last Year: Not on file  Transportation Needs:   . Lack of Transportation (Medical): Not on file  . Lack of Transportation (Non-Medical): Not on file  Physical Activity:   . Days of Exercise per Week: Not on file  . Minutes of Exercise per Session: Not on file  Stress:   . Feeling of Stress : Not on file  Social Connections:   . Frequency of Communication with Friends and Family: Not on file  . Frequency of Social Gatherings with Friends and Family: Not on file  . Attends Religious Services: Not on file  . Active Member of Clubs or Organizations: Not on file  . Attends Banker Meetings: Not on file  . Marital Status: Not on file     Family History: The patient's family history includes Cancer in his maternal grandfather; Healthy in his father; Hypertension in his mother; Stroke in his maternal grandmother. There is no history of Heart disease.  ROS:   Please see the history of present illness.    Not compliant with CPAP. Not sleeping well. All other systems reviewed and are negative.  EKGs/Labs/Other Studies Reviewed:    The following studies were reviewed today: No recent or new data.  EKG:  EKG sinus rhythm with inferior T wave abnormality. Otherwise unremarkable.  Recent Labs: 12/04/2018: ALT 57; BUN 8; Creatinine, Ser 0.89; Hemoglobin 14.0; Platelets 274; Potassium 4.1; Sodium 138  Recent Lipid Panel    Component Value Date/Time   CHOL 330 (H) 12/04/2018 1609   TRIG 416 (H) 12/04/2018 1609   HDL 42 12/04/2018 1609   CHOLHDL 7.9 (H) 12/04/2018 1609   CHOLHDL 8.9 (H) 08/26/2016 1546   VLDL NOT CALC 08/26/2016 1546   LDLCALC 202 (H) 12/04/2018 1609    Physical Exam:    VS:  BP (!) 142/102   Pulse 75   Ht 6\' 1"  (1.854 m)   Wt 288 lb 3.2 oz (130.7 kg)   SpO2 97%   BMI 38.02 kg/m     Wt Readings from Last 3 Encounters:  11/05/19 288 lb 3.2 oz (130.7 kg)  02/26/19 279 lb 6.4 oz (126.7 kg)  01/17/19 299 lb 3.2 oz (135.7 kg)       GEN: Morbid obesity with BMI 38. No acute distress HEENT: Normal NECK: No JVD. LYMPHATICS: No lymphadenopathy CARDIAC:  RRR without murmur, gallop, or edema. VASCULAR: normal Pulses. No bruits. RESPIRATORY:  Clear to auscultation without rales, wheezing or rhonchi  ABDOMEN: Soft, non-tender, non-distended, No pulsatile mass, MUSCULOSKELETAL: No deformity  SKIN: Warm and dry NEUROLOGIC:  Alert and oriented x 3 PSYCHIATRIC:  Normal affect   ASSESSMENT:  1. Paroxysmal atrial fibrillation (HCC)   2. Type 2 diabetes mellitus without complication, without long-term current use of insulin (HCC)   3. Primary hypertension   4. OSA (obstructive sleep apnea)   5. Morbid obesity (HCC)   6. Mixed hyperlipidemia   7. Chronic combined systolic and diastolic heart failure (HCC)   8. Educated about COVID-19 virus infection    PLAN:    In order of problems listed above:  1. No clinical recurrence of A. fib that he is aware of. 2. A1c 12.8 when last checked in January. Low carbohydrate diet, weight loss, diet and physical activity discussed. He does get physical activity at both of his jobs which require a lot of walking. 3. Discussed the importance of the Dash diet which includes veggies and fruit which are sources of potassium, and also discussed decrease in sodium intake. Weight loss and exercise will also help. Compliance with CPAP will help. He understands these connections now and was not aware. 4. Encouraged compliance with CPAP. 5. Encouraged reduction in carbohydrate intake. More veggies and fruit. Less carbohydrates. 6. We are treating LDL with Crestor 40 mg/day. Most recent LDL however was 202 in November 2020. We need to recheck his blood work when he comes back later for blood pressure check this month. 7. Continue Hyzaar 100/25 mg/day, amlodipine 10 mg/day. May need to add low-dose spironolactone. He is on [place]. At some point we will repeat LV function echo study. 8. COVID-19  avoidance/masking recommended. Vaccination recommended.  Overall education and awareness concerning primary risk prevention was discussed in detail: LDL less than 70, hemoglobin A1c less than 7, blood pressure target less than 130/80 mmHg, >150 minutes of moderate aerobic activity per week, avoidance of smoking, weight control (via diet and exercise), and continued surveillance/management of/for obstructive sleep apnea.    Medication Adjustments/Labs and Tests Ordered: Current medicines are reviewed at length with the patient today.  Concerns regarding medicines are outlined above.  Orders Placed This Encounter  Procedures  . EKG 12-Lead   No orders of the defined types were placed in this encounter.   Patient Instructions  Medication Instructions:  Your physician recommends that you continue on your current medications as directed. Please refer to the Current Medication list given to you today.  *If you need a refill on your cardiac medications before your next appointment, please call your pharmacy*   Lab Work: None If you have labs (blood work) drawn today and your tests are completely normal, you will receive your results only by: Marland Kitchen MyChart Message (if you have MyChart) OR . A paper copy in the mail If you have any lab test that is abnormal or we need to change your treatment, we will call you to review the results.   Testing/Procedures: None   Follow-Up:  Your physician recommends that you schedule a follow-up appointment in: our Hypertension Clinic in 3-6 weeks.  Bring your blood pressure readings with you.  At [facility], you and your health needs are our priority.  As part of our continuing mission to provide you with exceptional heart care, we have created designated Provider Care Teams.  These Care Teams include your primary Cardiologist (physician) and Advanced Practice Providers (APPs -  Physician Assistants and Nurse Practitioners) who all work together to  provide you with the care you need, when you need it.  We recommend signing up for the patient portal called "MyChart".  Sign up information is provided on this After Visit Summary.  MyChart  is used to connect with patients for Virtual Visits (Telemedicine).  Patients are able to view lab/test results, encounter notes, upcoming appointments, etc.  Non-urgent messages can be sent to your provider as well.   To learn more about what you can do with MyChart, go to [email or web address].    Your next appointment:   6 month(s)  The format for your next appointment:   In Person  Provider:   You may see Lesleigh NoeHenry W Joel Mericle III, MD or one of the following Advanced Practice Providers on your designated Care Team:    Norma FredricksonLori Gerhardt, NP  Nada BoozerLaura Ingold, NP  Georgie ChardJill McDaniel, NP    Other Instructions   Low-Sodium Eating Plan Sodium, which is an element that makes up salt, helps you maintain a healthy balance of fluids in your body. Too much sodium can increase your blood pressure and cause fluid and waste to be held in your body. Your health care provider or dietitian may recommend following this plan if you have high blood pressure (hypertension), kidney disease, liver disease, or heart failure. Eating less sodium can help lower your blood pressure, reduce swelling, and protect your heart, liver, and kidneys. What are tips for following this plan? General guidelines  Most people on this plan should limit their sodium intake to 1,500-2,000 mg (milligrams) of sodium each day. Reading food labels   The Nutrition Facts label lists the amount of sodium in one serving of the food. If you eat more than one serving, you must multiply the listed amount of sodium by the number of servings.  Choose foods with less than 140 mg of sodium per serving.  Avoid foods with 300 mg of sodium or more per serving. Shopping  Look for lower-sodium products, often labeled as "low-sodium" or "no salt added."  Always  check the sodium content even if foods are labeled as "unsalted" or "no salt added".  Buy fresh foods. ? Avoid canned foods and premade or frozen meals. ? Avoid canned, cured, or processed meats  Buy breads that have less than 80 mg of sodium per slice. Cooking  Eat more home-cooked food and less restaurant, buffet, and fast food.  Avoid adding salt when cooking. Use salt-free seasonings or herbs instead of table salt or sea salt. Check with your health care provider or pharmacist before using salt substitutes.  Cook with plant-based oils, such as canola, sunflower, or olive oil. Meal planning  When eating at a restaurant, ask that your food be prepared with less salt or no salt, if possible.  Avoid foods that contain MSG (monosodium glutamate). MSG is sometimes added to [place] food, bouillon, and some canned foods. What foods are recommended? The items listed may not be a complete list. Talk with your dietitian about what dietary choices are best for you. Grains Low-sodium cereals, including oats, puffed wheat and rice, and shredded wheat. Low-sodium crackers. Unsalted rice. Unsalted pasta. Low-sodium bread. Whole-grain breads and whole-grain pasta. Vegetables Fresh or frozen vegetables. "No salt added" canned vegetables. "No salt added" tomato sauce and paste. Low-sodium or reduced-sodium tomato and vegetable juice. Fruits Fresh, frozen, or canned fruit. Fruit juice. Meats and other protein foods Fresh or frozen (no salt added) meat, poultry, seafood, and fish. Low-sodium canned tuna and salmon. Unsalted nuts. Dried peas, beans, and lentils without added salt. Unsalted canned beans. Eggs. Unsalted nut butters. Dairy Milk. Soy milk. Cheese that is naturally low in sodium, such as ricotta cheese, fresh mozzarella, or Swiss cheese Low-sodium or reduced-sodium cheese. Cream  cheese. Yogurt. Fats and oils Unsalted butter. Unsalted margarine with no trans fat. Vegetable oils such as  canola or olive oils. Seasonings and other foods Fresh and dried herbs and spices. Salt-free seasonings. Low-sodium mustard and ketchup. Sodium-free salad dressing. Sodium-free light mayonnaise. Fresh or refrigerated horseradish. Lemon juice. Vinegar. Homemade, reduced-sodium, or low-sodium soups. Unsalted popcorn and pretzels. Low-salt or salt-free chips. What foods are not recommended? The items listed may not be a complete list. Talk with your dietitian about what dietary choices are best for you. Grains Instant hot cereals. Bread stuffing, pancake, and biscuit mixes. Croutons. Seasoned rice or pasta mixes. Noodle soup cups. Boxed or frozen macaroni and cheese. Regular salted crackers. Self-rising flour. Vegetables Sauerkraut, pickled vegetables, and relishes. Olives. [place] fries. Onion rings. Regular canned vegetables (not low-sodium or reduced-sodium). Regular canned tomato sauce and paste (not low-sodium or reduced-sodium). Regular tomato and vegetable juice (not low-sodium or reduced-sodium). Frozen vegetables in sauces. Meats and other protein foods Meat or fish that is salted, canned, smoked, spiced, or pickled. Bacon, ham, sausage, hotdogs, corned beef, chipped beef, packaged lunch meats, salt pork, jerky, pickled herring, anchovies, regular canned tuna, sardines, salted nuts. Dairy Processed cheese and cheese spreads. Cheese curds. Blue cheese. Feta cheese. String cheese. Regular cottage cheese. Buttermilk. Canned milk. Fats and oils Salted butter. Regular margarine. Ghee. Bacon fat. Seasonings and other foods Onion salt, garlic salt, seasoned salt, table salt, and sea salt. Canned and packaged gravies. Worcestershire sauce. Tartar sauce. Barbecue sauce. Teriyaki sauce. Soy sauce, including reduced-sodium. Steak sauce. Fish sauce. Oyster sauce. Cocktail sauce. Horseradish that you find on the shelf. Regular ketchup and mustard. Meat flavorings and tenderizers. Bouillon cubes. Hot sauce  and Tabasco sauce. Premade or packaged marinades. Premade or packaged taco seasonings. Relishes. Regular salad dressings. Salsa. Potato and tortilla chips. Corn chips and puffs. Salted popcorn and pretzels. Canned or dried soups. Pizza. Frozen entrees and pot pies. Summary  Eating less sodium can help lower your blood pressure, reduce swelling, and protect your heart, liver, and kidneys.  Most people on this plan should limit their sodium intake to 1,500-2,000 mg (milligrams) of sodium each day.  Canned, boxed, and frozen foods are high in sodium. Restaurant foods, fast foods, and pizza are also very high in sodium. You also get sodium by adding salt to food.  Try to cook at home, eat more fresh fruits and vegetables, and eat less fast food, canned, processed, or prepared foods. This information is not intended to replace advice given to you by your health care provider. Make sure you discuss any questions you have with your health care provider. Document Revised: 12/03/2016 Document Reviewed: 12/15/2015 Elsevier Patient Education  2020 [employer].     Signed, Lesleigh Noe, MD  11/05/2019 10:47 AM    [facility]

## 2019-11-05 ENCOUNTER — Other Ambulatory Visit: Payer: Self-pay

## 2019-11-05 ENCOUNTER — Ambulatory Visit (INDEPENDENT_AMBULATORY_CARE_PROVIDER_SITE_OTHER): Payer: BC Managed Care – PPO | Admitting: Interventional Cardiology

## 2019-11-05 ENCOUNTER — Encounter: Payer: Self-pay | Admitting: Interventional Cardiology

## 2019-11-05 VITALS — BP 142/102 | HR 75 | Ht 73.0 in | Wt 288.2 lb

## 2019-11-05 DIAGNOSIS — E119 Type 2 diabetes mellitus without complications: Secondary | ICD-10-CM | POA: Diagnosis not present

## 2019-11-05 DIAGNOSIS — I480 Paroxysmal atrial fibrillation: Secondary | ICD-10-CM | POA: Diagnosis not present

## 2019-11-05 DIAGNOSIS — I10 Essential (primary) hypertension: Secondary | ICD-10-CM | POA: Diagnosis not present

## 2019-11-05 DIAGNOSIS — G4733 Obstructive sleep apnea (adult) (pediatric): Secondary | ICD-10-CM

## 2019-11-05 DIAGNOSIS — I5042 Chronic combined systolic (congestive) and diastolic (congestive) heart failure: Secondary | ICD-10-CM

## 2019-11-05 DIAGNOSIS — Z7189 Other specified counseling: Secondary | ICD-10-CM

## 2019-11-05 DIAGNOSIS — E782 Mixed hyperlipidemia: Secondary | ICD-10-CM

## 2019-11-05 NOTE — Patient Instructions (Signed)
Medication Instructions:  Your physician recommends that you continue on your current medications as directed. Please refer to the Current Medication list given to you today.  *If you need a refill on your cardiac medications before your next appointment, please call your pharmacy*   Lab Work: None If you have labs (blood work) drawn today and your tests are completely normal, you will receive your results only by: Marland Kitchen MyChart Message (if you have MyChart) OR . A paper copy in the mail If you have any lab test that is abnormal or we need to change your treatment, we will call you to review the results.   Testing/Procedures: None   Follow-Up:  Your physician recommends that you schedule a follow-up appointment in: our Hypertension Clinic in 3-6 weeks.  Bring your blood pressure readings with you.  At [facility], you and your health needs are our priority.  As part of our continuing mission to provide you with exceptional heart care, we have created designated Provider Care Teams.  These Care Teams include your primary Cardiologist (physician) and Advanced Practice Providers (APPs -  Physician Assistants and Nurse Practitioners) who all work together to provide you with the care you need, when you need it.  We recommend signing up for the patient portal called "MyChart".  Sign up information is provided on this After Visit Summary.  MyChart is used to connect with patients for Virtual Visits (Telemedicine).  Patients are able to view lab/test results, encounter notes, upcoming appointments, etc.  Non-urgent messages can be sent to your provider as well.   To learn more about what you can do with MyChart, go to [email or web address].    Your next appointment:   6 month(s)  The format for your next appointment:   In Person  Provider:   You may see Lesleigh Noe, MD or one of the following Advanced Practice Providers on your designated Care Team:    Norma Fredrickson, NP  Nada Boozer, NP  Georgie Chard, NP    Other Instructions   Low-Sodium Eating Plan Sodium, which is an element that makes up salt, helps you maintain a healthy balance of fluids in your body. Too much sodium can increase your blood pressure and cause fluid and waste to be held in your body. Your health care provider or dietitian may recommend following this plan if you have high blood pressure (hypertension), kidney disease, liver disease, or heart failure. Eating less sodium can help lower your blood pressure, reduce swelling, and protect your heart, liver, and kidneys. What are tips for following this plan? General guidelines  Most people on this plan should limit their sodium intake to 1,500-2,000 mg (milligrams) of sodium each day. Reading food labels   The Nutrition Facts label lists the amount of sodium in one serving of the food. If you eat more than one serving, you must multiply the listed amount of sodium by the number of servings.  Choose foods with less than 140 mg of sodium per serving.  Avoid foods with 300 mg of sodium or more per serving. Shopping  Look for lower-sodium products, often labeled as "low-sodium" or "no salt added."  Always check the sodium content even if foods are labeled as "unsalted" or "no salt added".  Buy fresh foods. ? Avoid canned foods and premade or frozen meals. ? Avoid canned, cured, or processed meats  Buy breads that have less than 80 mg of sodium per slice. Cooking  Eat more home-cooked food and  less restaurant, buffet, and fast food.  Avoid adding salt when cooking. Use salt-free seasonings or herbs instead of table salt or sea salt. Check with your health care provider or pharmacist before using salt substitutes.  Cook with plant-based oils, such as canola, sunflower, or olive oil. Meal planning  When eating at a restaurant, ask that your food be prepared with less salt or no salt, if possible.  Avoid foods that contain MSG  (monosodium glutamate). MSG is sometimes added to [place] food, bouillon, and some canned foods. What foods are recommended? The items listed may not be a complete list. Talk with your dietitian about what dietary choices are best for you. Grains Low-sodium cereals, including oats, puffed wheat and rice, and shredded wheat. Low-sodium crackers. Unsalted rice. Unsalted pasta. Low-sodium bread. Whole-grain breads and whole-grain pasta. Vegetables Fresh or frozen vegetables. "No salt added" canned vegetables. "No salt added" tomato sauce and paste. Low-sodium or reduced-sodium tomato and vegetable juice. Fruits Fresh, frozen, or canned fruit. Fruit juice. Meats and other protein foods Fresh or frozen (no salt added) meat, poultry, seafood, and fish. Low-sodium canned tuna and salmon. Unsalted nuts. Dried peas, beans, and lentils without added salt. Unsalted canned beans. Eggs. Unsalted nut butters. Dairy Milk. Soy milk. Cheese that is naturally low in sodium, such as ricotta cheese, fresh mozzarella, or Swiss cheese Low-sodium or reduced-sodium cheese. Cream cheese. Yogurt. Fats and oils Unsalted butter. Unsalted margarine with no trans fat. Vegetable oils such as canola or olive oils. Seasonings and other foods Fresh and dried herbs and spices. Salt-free seasonings. Low-sodium mustard and ketchup. Sodium-free salad dressing. Sodium-free light mayonnaise. Fresh or refrigerated horseradish. Lemon juice. Vinegar. Homemade, reduced-sodium, or low-sodium soups. Unsalted popcorn and pretzels. Low-salt or salt-free chips. What foods are not recommended? The items listed may not be a complete list. Talk with your dietitian about what dietary choices are best for you. Grains Instant hot cereals. Bread stuffing, pancake, and biscuit mixes. Croutons. Seasoned rice or pasta mixes. Noodle soup cups. Boxed or frozen macaroni and cheese. Regular salted crackers. Self-rising flour. Vegetables Sauerkraut, pickled  vegetables, and relishes. Olives. [place] fries. Onion rings. Regular canned vegetables (not low-sodium or reduced-sodium). Regular canned tomato sauce and paste (not low-sodium or reduced-sodium). Regular tomato and vegetable juice (not low-sodium or reduced-sodium). Frozen vegetables in sauces. Meats and other protein foods Meat or fish that is salted, canned, smoked, spiced, or pickled. Bacon, ham, sausage, hotdogs, corned beef, chipped beef, packaged lunch meats, salt pork, jerky, pickled herring, anchovies, regular canned tuna, sardines, salted nuts. Dairy Processed cheese and cheese spreads. Cheese curds. Blue cheese. Feta cheese. String cheese. Regular cottage cheese. Buttermilk. Canned milk. Fats and oils Salted butter. Regular margarine. Ghee. Bacon fat. Seasonings and other foods Onion salt, garlic salt, seasoned salt, table salt, and sea salt. Canned and packaged gravies. Worcestershire sauce. Tartar sauce. Barbecue sauce. Teriyaki sauce. Soy sauce, including reduced-sodium. Steak sauce. Fish sauce. Oyster sauce. Cocktail sauce. Horseradish that you find on the shelf. Regular ketchup and mustard. Meat flavorings and tenderizers. Bouillon cubes. Hot sauce and Tabasco sauce. Premade or packaged marinades. Premade or packaged taco seasonings. Relishes. Regular salad dressings. Salsa. Potato and tortilla chips. Corn chips and puffs. Salted popcorn and pretzels. Canned or dried soups. Pizza. Frozen entrees and pot pies. Summary  Eating less sodium can help lower your blood pressure, reduce swelling, and protect your heart, liver, and kidneys.  Most people on this plan should limit their sodium intake to 1,500-2,000 mg (milligrams) of sodium  each day.  Canned, boxed, and frozen foods are high in sodium. Restaurant foods, fast foods, and pizza are also very high in sodium. You also get sodium by adding salt to food.  Try to cook at home, eat more fresh fruits and vegetables, and eat less fast  food, canned, processed, or prepared foods. This information is not intended to replace advice given to you by your health care provider. Make sure you discuss any questions you have with your health care provider. Document Revised: 12/03/2016 Document Reviewed: 12/15/2015 Elsevier Patient Education  2020 [employer].

## 2019-12-07 ENCOUNTER — Encounter: Payer: Self-pay | Admitting: Pharmacist

## 2019-12-07 ENCOUNTER — Other Ambulatory Visit: Payer: Self-pay

## 2019-12-07 ENCOUNTER — Ambulatory Visit (INDEPENDENT_AMBULATORY_CARE_PROVIDER_SITE_OTHER): Payer: BC Managed Care – PPO | Admitting: Pharmacist

## 2019-12-07 VITALS — BP 148/76 | HR 83 | Wt 279.4 lb

## 2019-12-07 DIAGNOSIS — I10 Essential (primary) hypertension: Secondary | ICD-10-CM | POA: Diagnosis not present

## 2019-12-07 DIAGNOSIS — E119 Type 2 diabetes mellitus without complications: Secondary | ICD-10-CM | POA: Diagnosis not present

## 2019-12-07 DIAGNOSIS — I152 Hypertension secondary to endocrine disorders: Secondary | ICD-10-CM | POA: Diagnosis not present

## 2019-12-07 DIAGNOSIS — E1169 Type 2 diabetes mellitus with other specified complication: Secondary | ICD-10-CM

## 2019-12-07 DIAGNOSIS — E1159 Type 2 diabetes mellitus with other circulatory complications: Secondary | ICD-10-CM | POA: Diagnosis not present

## 2019-12-07 DIAGNOSIS — E785 Hyperlipidemia, unspecified: Secondary | ICD-10-CM

## 2019-12-07 MED ORDER — TRULICITY 1.5 MG/0.5ML ~~LOC~~ SOAJ: 1.5000 mg | SUBCUTANEOUS | 0 refills | Status: AC

## 2019-12-07 MED ORDER — DILTIAZEM HCL ER COATED BEADS 300 MG PO CP24
300.0000 mg | ORAL_CAPSULE | Freq: Every day | ORAL | 1 refills | Status: DC
Start: 2019-12-07 — End: 2020-06-04

## 2019-12-07 NOTE — Patient Instructions (Addendum)
It was nice meeting you today!  We would like your blood pressure to be less than 130/80  We would like you blood sugar in the mornings to be between 80-130  Since your amlodipine and diltiazem are similar medications, we will stop the amlodipine and increase the diltiazem to 300mg  once a day  Once we get your lab work back, we can refill your other blood pressure medications  Continue to exercise and cut down on sugar  Recheck in 2-4 weeks  , PharmD, BCACP, CDCES, CPP [facility] [address], [place], [place] Phone: [phone]; Fax: [phone] 12/07/2019 2:14 PM

## 2019-12-07 NOTE — Progress Notes (Signed)
Patient ID: Jesse Duncan                 DOB: 01-21-1985                      MRN: [id number]     HPI: Charley Miske is a 34 y.o. male referred by Dr. [name] to HTN clinic. PMH is significant for A Fib, DM, CHF, HTN, OSA, obesity and HLD.  Patient has history of uncontrolled HTN, DM, and non compliance with CPAP.    Patient presents today in good spirits. Is more motivated to get health in order since last visit with Dr [name] 1 month ago.  Has been cutting down on sugar and trying to eat smaller portions.  Has lost almost 10 pounds in last month. Is trying to be better with medication compliance and says the only medication he has been forgetful about is his nighttime Metformin dose.    HTN meds: Amlodipine 10mg  daily Diltiazem 240mg  daily Losartan/HCTZ 100/25 daily  DM meds Metformin 1000 mg BID (forgets PM dose often) Trulicity 0.75mg  weekly Jardiance 25mg  daily  HLD meds Rosuvastatin 40mg  daily  Does not check his BP or BG at home.  Needs to establish with a new PCP who discharged him due to a billing issue.  Therefore needs refills on most maintenance medications.  Has been trying to be more compliant with CPAP but is not wearing it nightly.    Drinks alcohol socially and on weekends.  Smoked cigars over the holiday.  Previously tried: , lisinopril/hctz (does not know why they were discontinued) BP goal: <130/80  Social History: cigars, alcohol  Relevant Labs: TC 330, Trigs 416, LDL 202 (12/04/18), A1c 12.8 (01/17/19), Na 138, K 4.1, Scr 0.89 (12/04/18)  Wt Readings from Last 3 Encounters:  11/05/19 288 lb 3.2 oz (130.7 kg)  02/26/19 279 lb 6.4 oz (126.7 kg)  01/17/19 299 lb 3.2 oz (135.7 kg)   BP Readings from Last 3 Encounters:  11/05/19 (!) 142/102  02/26/19 140/90  01/17/19 (!) 174/120   Pulse Readings from Last 3 Encounters:  11/05/19 75  02/26/19 85  01/17/19 87    Renal function: CrCl cannot be calculated (Patient's most recent lab result  is older than the maximum 21 days allowed.).  Past Medical History:  Diagnosis Date  . Allergic rhinitis due to pollen 07/06/2010  . Atrial fibrillation (HCC) 08/15/2015  . Diabetes mellitus without complication (HCC)   . Dyslipidemia   . Hyperlipidemia 05/25/2010  . Hypertension   . Impaired fasting blood sugar 08/15/2015  . Obesity   . OSA (obstructive sleep apnea) 10/20/2015  . Sleep apnea   . Smoker   . Tobacco use disorder 11/14/2013    Current Outpatient Medications on File Prior to Visit  Medication Sig Dispense Refill  . amLODipine (NORVASC) 10 MG tablet Take 1 tablet (10 mg total) by mouth daily. 90 tablet 3  . aspirin EC 81 MG tablet Take 1 tablet (81 mg total) by mouth daily. 90 tablet 3  . buPROPion (WELLBUTRIN XL) 150 MG 24 hr tablet Take 1 tablet (150 mg total) by mouth daily. 90 tablet 1  . diltiazem (CARDIZEM CD) 240 MG 24 hr capsule TAKE 1 CAPSULE BY MOUTH EVERY DAY 90 capsule 0  . Dulaglutide (TRULICITY) 0.75 MG/0.5ML SOPN Inject 0.5 mLs (0.75 mg total) into the skin once a week. 4 pen 5  . empagliflozin (JARDIANCE) 25 MG TABS tablet Take 25 mg by mouth daily before  breakfast. 30 tablet 5  . glucose blood test strip Test twice a day. Pt uses one touch verio flex 100 each 2  . losartan-hydrochlorothiazide (HYZAAR) 100-25 MG tablet TAKE 1 TABLET BY MOUTH EVERY DAY 90 tablet 0  . metFORMIN (GLUCOPHAGE) 1000 MG tablet TAKE 1 TABLET (1,000 MG TOTAL) BY MOUTH 2 (TWO) TIMES DAILY WITH A MEAL. 180 tablet 0  . ONETOUCH DELICA LANCETS FINE MISC Test twice a day. Pt uses one touch verio flex 100 each 2  . rosuvastatin (CRESTOR) 40 MG tablet Take 1 tablet (40 mg total) by mouth daily. 90 tablet 3  . sildenafil (VIAGRA) 100 MG tablet Take 1 tablet (100 mg total) by mouth daily as needed for erectile dysfunction. 20 tablet 5   No current facility-administered medications on file prior to visit.    No Known Allergies   Assessment/Plan:  1. Hypertension - Patient BP in room  148/76 which is above goal of <130/80.  However this has improved since last month likely due to weight loss and focusing on medication compliance.  Encouraged continued compliance and explained the relationship between blood pressure, blood sugar, diet, and exercise.  Patient voiced understanding.  Prefer patient remain on amlodipine for blood pressure lowering efficacy however he is also on diltiazem and concern over therapeutic dup[lication using CCB.  Concern that if diltiazem is stopped patient may go back into A Fib.  Will d/c amlodipine today, continue diltiazem and update lab work.  If renal function and electrolytes normal, consider change from losartan/hctz to stronger ARB + chlorthalidone.  Patient is agreeable.   2. DM - Patient last A1c ~ 1 year ago was 12.8. Recommended patient focus on  continued diet changes especially avoiding sugary processed foods since he has been successful in last month.  Explained mechanism of sugar metabolism and role of insulin and mechanism of metformin and importance of compliance.  Patient has also been on low dose Trulicity for ~1 year.  Reported success with blood sugar and weight loss 3 years ago on Saxenda.  Will increase Trulicity to 1.5mg  weekly for blood sugar control and to reduce risk of MACE and ASCVD.  Refill metformin if lab work WNL.  Gave patient information on how to find and establish with a new PCP.  Will recheck in 1 month.  Laural Golden, PharmD, BCACP, CDCES, CPP [facility] [address], [place], [place] [address] Phone: [phone]; Fax: [phone] 12/07/2019 3:04 PM

## 2019-12-10 LAB — COMPREHENSIVE METABOLIC PANEL
ALT: 19 IU/L (ref 0–44)
AST: 16 IU/L (ref 0–40)
Albumin/Globulin Ratio: 1.7 (ref 1.2–2.2)
Albumin: 4.5 g/dL (ref 4.0–5.0)
Alkaline Phosphatase: 94 IU/L (ref 44–121)
BUN/Creatinine Ratio: 8 — ABNORMAL LOW (ref 9–20)
BUN: 8 mg/dL (ref 6–20)
Bilirubin Total: 0.4 mg/dL (ref 0.0–1.2)
CO2: 23 mmol/L (ref 20–29)
Calcium: 9.8 mg/dL (ref 8.7–10.2)
Chloride: 96 mmol/L (ref 96–106)
Creatinine, Ser: 0.95 mg/dL (ref 0.76–1.27)
GFR calc Af Amer: 121 mL/min/{1.73_m2} (ref >59)
GFR calc non Af Amer: 105 mL/min/{1.73_m2} (ref >59)
Globulin, Total: 2.7 g/dL (ref 1.5–4.5)
Glucose: 410 mg/dL — ABNORMAL HIGH (ref 65–99)
Potassium: 4.1 mmol/L (ref 3.5–5.2)
Sodium: 134 mmol/L (ref 134–144)
Total Protein: 7.2 g/dL (ref 6.0–8.5)

## 2019-12-10 LAB — HEMOGLOBIN A1C
Est. average glucose Bld gHb Est-mCnc: 352 mg/dL
Hgb A1c MFr Bld: 13.9 % — ABNORMAL HIGH (ref 4.8–5.6)

## 2019-12-11 ENCOUNTER — Telehealth: Payer: Self-pay | Admitting: Pharmacist

## 2019-12-11 DIAGNOSIS — E119 Type 2 diabetes mellitus without complications: Secondary | ICD-10-CM

## 2019-12-11 MED ORDER — TOUJEO SOLOSTAR 300 UNIT/ML ~~LOC~~ SOPN: PEN_INJECTOR | SUBCUTANEOUS | 2 refills | Status: DC

## 2019-12-11 MED ORDER — BD PEN NEEDLE NANO U/F 32G X 4 MM MISC: 11 refills | Status: DC

## 2019-12-11 NOTE — Telephone Encounter (Signed)
Spoke with patient and relayed bloodwork results.  A1c increased to 13.9.  Patient currently on metformin 1000 BID and Trulicity.  Does not have PCP.  Patient willing to restart insulin therapy.  Instructed patient to begin Toujeo at 10 units once a day and increase by 2 units every 3 days until FBG is less than 130.  Instructed patient to establish with PCP.  Patient voiced understanding.

## 2020-01-03 ENCOUNTER — Emergency Department (HOSPITAL_COMMUNITY): Payer: 59

## 2020-01-03 ENCOUNTER — Encounter (HOSPITAL_COMMUNITY): Payer: Self-pay

## 2020-01-03 ENCOUNTER — Other Ambulatory Visit: Payer: Self-pay

## 2020-01-03 ENCOUNTER — Emergency Department
Admission: EM | Admit: 2020-01-03 | Discharge: 2020-01-04 | Disposition: A | Discharge status: Other Acute Inpatient Hospital | Payer: 59 | Attending: Emergency Medicine | Admitting: Emergency Medicine

## 2020-01-03 DIAGNOSIS — R040 Epistaxis: Secondary | ICD-10-CM | POA: Insufficient documentation

## 2020-01-03 DIAGNOSIS — F17210 Nicotine dependence, cigarettes, uncomplicated: Secondary | ICD-10-CM | POA: Insufficient documentation

## 2020-01-03 DIAGNOSIS — S0101XA Laceration without foreign body of scalp, initial encounter: Secondary | ICD-10-CM | POA: Insufficient documentation

## 2020-01-03 DIAGNOSIS — S0240CA Maxillary fracture, right side, initial encounter for closed fracture: Secondary | ICD-10-CM | POA: Insufficient documentation

## 2020-01-03 DIAGNOSIS — M5136 Other intervertebral disc degeneration, lumbar region: Secondary | ICD-10-CM | POA: Insufficient documentation

## 2020-01-03 DIAGNOSIS — S0240EA Zygomatic fracture, right side, initial encounter for closed fracture: Secondary | ICD-10-CM | POA: Insufficient documentation

## 2020-01-03 DIAGNOSIS — S0083XA Contusion of other part of head, initial encounter: Secondary | ICD-10-CM

## 2020-01-03 DIAGNOSIS — S0285XA Fracture of orbit, unspecified, initial encounter for closed fracture: Secondary | ICD-10-CM

## 2020-01-03 DIAGNOSIS — R079 Chest pain, unspecified: Secondary | ICD-10-CM

## 2020-01-03 LAB — CBC WITH DIFF
BASOPHIL #: <0.1 10*3/uL (ref <=0.20)
BASOPHIL %: 0 %
EOSINOPHIL #: <0.1 10*3/uL (ref <=0.50)
EOSINOPHIL %: 0 %
HCT: 42.5 % (ref 38.9–52.0)
HGB: 14.4 g/dL (ref 13.4–17.5)
IMMATURE GRANULOCYTE #: <0.1 10*3/uL (ref <0.10)
IMMATURE GRANULOCYTE %: 0 % (ref 0–1)
LYMPHOCYTE #: 1.68 10*3/uL (ref 1.00–4.80)
LYMPHOCYTE %: 12 %
MCH: 27.6 pg (ref 26.0–32.0)
MCHC: 33.9 g/dL (ref 31.0–35.5)
MCV: 81.6 fL (ref 78.0–100.0)
MONOCYTE #: 0.78 10*3/uL (ref 0.20–1.10)
MONOCYTE %: 6 %
MPV: 10 fL (ref 8.7–12.5)
NEUTROPHIL #: 11.43 10*3/uL — ABNORMAL HIGH (ref 1.50–7.70)
NEUTROPHIL %: 82 %
PLATELETS: 257 10*3/uL (ref 150–400)
RBC: 5.21 10*6/uL (ref 4.50–6.10)
RDW-CV: 12.7 % (ref 11.5–15.5)
WBC: 14 10*3/uL — ABNORMAL HIGH (ref 3.7–11.0)

## 2020-01-03 LAB — BASIC METABOLIC PANEL
ANION GAP: 11 mmol/L
BUN/CREA RATIO: 10
BUN: 10 mg/dL (ref 10–25)
CALCIUM: 9.4 mg/dL (ref 8.8–10.3)
CHLORIDE: 103 mmol/L (ref 98–111)
CO2 TOTAL: 21 mmol/L (ref 21–35)
CREATININE: 0.97 mg/dL (ref <=1.30)
ESTIMATED GFR: >60 mL/min/{1.73_m2}
GLUCOSE: 113 mg/dL — ABNORMAL HIGH (ref 70–110)
POTASSIUM: 3.4 mmol/L — ABNORMAL LOW (ref 3.5–5.0)
SODIUM: 135 mmol/L (ref 135–145)

## 2020-01-03 LAB — HEPATIC FUNCTION PANEL
ALBUMIN: 4.9 g/dL — ABNORMAL HIGH (ref 3.2–4.6)
ALKALINE PHOSPHATASE: 90 U/L (ref 20–130)
ALT (SGPT): 57 U/L — ABNORMAL HIGH (ref <=52)
AST (SGOT): 32 U/L (ref <=35)
BILIRUBIN DIRECT: 0.1 mg/dL (ref <=0.3)
BILIRUBIN TOTAL: 0.4 mg/dL (ref 0.3–1.2)
PROTEIN TOTAL: 7.3 g/dL (ref 6.0–8.3)

## 2020-01-03 LAB — TROPONIN-I: TROPONIN I: <0.03 ng/mL (ref <=0.04)

## 2020-01-03 LAB — PT/INR: INR: 0.99 (ref 0.80–1.10)

## 2020-01-03 LAB — LIPASE: LIPASE: 13 U/L (ref <=82)

## 2020-01-03 MED ORDER — FENTANYL (PF) 50 MCG/ML INJECTION SOLUTION
75.0000 ug | INTRAMUSCULAR | Status: AC
Start: 2020-01-04 — End: 2020-01-04
  Administered 2020-01-04: 75 ug via INTRAVENOUS
  Filled 2020-01-03: qty 2

## 2020-01-03 MED ORDER — IOVERSOL 320 MG IODINE/ML INTRAVENOUS SOLUTION
120.0000 mL | INTRAVENOUS | Status: AC
Start: 2020-01-03 — End: 2020-01-03
  Administered 2020-01-03: 120 mL via INTRAVENOUS

## 2020-01-03 MED ORDER — SODIUM CHLORIDE 0.9 % IV BOLUS
1000.00 mL | INJECTION | Status: AC
Start: 2020-01-03 — End: 2020-01-03
  Administered 2020-01-03: 0 mL via INTRAVENOUS
  Administered 2020-01-03: 1000 mL via INTRAVENOUS

## 2020-01-03 MED ORDER — FENTANYL (PF) 50 MCG/ML INJECTION SOLUTION
75.00 ug | INTRAMUSCULAR | Status: AC
Start: 2020-01-03 — End: 2020-01-03
  Administered 2020-01-03: 75 ug via INTRAVENOUS
  Filled 2020-01-03: qty 2

## 2020-01-03 NOTE — Incoming ED Transfer Note (Signed)
ED TRANSFER NOTE    Narrative: Presented to [facility] with c/o [facility] with right maxilla fracture and right orbit fracture.  CT head, c-spine, facial bones, C/A/P obtained.  Patient received Fentanyl and NS.  COVID pending.  Dr. Thyra Breed, ENT contacted with recommendation to transfer to ER.  Dr. Dalene Carrow contacted with pending ER arrival.      Type of Transfer: GENERAL    INCOMING GENERAL PT INFORMATION    Transferring Facility: [facility]     Incoming Transfer ETA: 0000    Mode of Arrival: Ambulance    Reason for transfer: General     Arrived at Transferring Facility:Today     Interventions: CT head, cspine, facial bones, C/A/P, Fentanyl    Response to Interventions: N/A    Anticoagulation/Antiplatet: No     Pertinent Labs: N/A     Reported Vital Signs:     HR 99      BP 153/72    Temp 36.7    O2 Sat 100    RR 20    Weight 99.2 kg     Images Obtained Prior to Transfer: Yes    If Yes, What Images?  CT CAP, CT C-Spine and CT Head  Mode of Receiving Images?  IMAGES AVAIL MODE: Epic  Key Findings: Right maxilla fracture, right orbit fracture    Comments:  Dr. Thyra Breed and Dr. Dalene Carrow contacted     Recommendations to transferring facility: N/A    Anticipated immediate needs on arrival (please provide rationale):  Facial Trauma              **RUBY ONLY - Go To Navigator and choose the Type of Activation to print to MEDCOM**    Jeannine Boga, RN, 01/03/2020 22:54
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
## 2020-01-04 ENCOUNTER — Emergency Department (HOSPITAL_COMMUNITY): Payer: 59

## 2020-01-04 ENCOUNTER — Emergency Department
Admission: EM | Admit: 2020-01-04 | Discharge: 2020-01-04 | Payer: 59 | Source: Other Acute Inpatient Hospital | Attending: Emergency Medicine | Admitting: Emergency Medicine

## 2020-01-04 DIAGNOSIS — S0232XA Fracture of orbital floor, left side, initial encounter for closed fracture: Secondary | ICD-10-CM

## 2020-01-04 DIAGNOSIS — S0240CA Maxillary fracture, right side, initial encounter for closed fracture: Secondary | ICD-10-CM | POA: Insufficient documentation

## 2020-01-04 DIAGNOSIS — F17210 Nicotine dependence, cigarettes, uncomplicated: Secondary | ICD-10-CM | POA: Insufficient documentation

## 2020-01-04 DIAGNOSIS — S02841A Fracture of lateral orbital wall, right side, initial encounter for closed fracture: Secondary | ICD-10-CM | POA: Insufficient documentation

## 2020-01-04 DIAGNOSIS — S0285XA Fracture of orbit, unspecified, initial encounter for closed fracture: Secondary | ICD-10-CM

## 2020-01-04 DIAGNOSIS — S0240EA Zygomatic fracture, right side, initial encounter for closed fracture: Secondary | ICD-10-CM

## 2020-01-04 DIAGNOSIS — S0101XA Laceration without foreign body of scalp, initial encounter: Secondary | ICD-10-CM | POA: Insufficient documentation

## 2020-01-04 LAB — COVID-19 ~~LOC~~ MOLECULAR LAB TESTING
INFLUENZA VIRUS TYPE A: NOT DETECTED
INFLUENZA VIRUS TYPE B: NOT DETECTED
RESPIRATORY SYNCTIAL VIRUS (RSV): NOT DETECTED
SARS-CoV-2: NOT DETECTED

## 2020-01-04 MED ORDER — OXYCODONE-ACETAMINOPHEN 5 MG-325 MG TABLET
2.0000 | ORAL_TABLET | ORAL | Status: AC
Start: 2020-01-04 — End: 2020-01-04
  Administered 2020-01-04: 2 via ORAL
  Filled 2020-01-04: qty 2

## 2020-01-04 MED ORDER — ERYTHROMYCIN 5 MG/GRAM (0.5 %) EYE OINTMENT
TOPICAL_OINTMENT | Freq: Four times a day (QID) | OPHTHALMIC | 0 refills | Status: AC
Start: 2020-01-04 — End: 2020-01-09

## 2020-01-04 MED ORDER — IBUPROFEN 800 MG TABLET
800.0000 mg | ORAL_TABLET | ORAL | Status: AC
Start: 2020-01-04 — End: 2020-01-04
  Administered 2020-01-04: 800 mg via ORAL
  Filled 2020-01-04: qty 1

## 2020-01-04 MED ORDER — CYCLOPENTOLATE 1 % EYE DROPS
1.0000 [drp] | Freq: Once | OPHTHALMIC | 0 refills | Status: AC
Start: 2020-01-04 — End: 2020-01-04

## 2020-01-04 MED ORDER — KETOROLAC 60 MG/2 ML INTRAMUSCULAR SOLUTION
30.0000 mg | INTRAMUSCULAR | Status: AC
Start: 2020-01-04 — End: 2020-01-04
  Administered 2020-01-04: 30 mg via INTRAMUSCULAR
  Filled 2020-01-04: qty 2

## 2020-01-04 MED ORDER — CLINDAMYCIN HCL 150 MG CAPSULE
450.0000 mg | ORAL_CAPSULE | ORAL | Status: AC
Start: 2020-01-04 — End: 2020-01-04
  Administered 2020-01-04: 450 mg via ORAL
  Filled 2020-01-04: qty 3

## 2020-01-04 MED ORDER — KETOROLAC 15 MG/ML INJECTION SOLUTION
15.0000 mg | INTRAMUSCULAR | Status: DC
Start: 2020-01-04 — End: 2020-01-04

## 2020-01-04 MED ORDER — OXYCODONE 5 MG TABLET
5.0000 mg | ORAL_TABLET | Freq: Four times a day (QID) | ORAL | 0 refills | Status: AC | PRN
Start: 2020-01-04 — End: 2020-01-07
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
## 2020-01-04 NOTE — ED Nurses Note (Signed)
Nursing report called to ER RN at [facility]. Earnest Bailey, RN  01/04/2020, 00:00
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
## 2020-01-04 NOTE — ED Nurses Note (Signed)
MD is assessing pt.

## 2020-01-04 NOTE — Procedures (Signed)
Procedure    Lac Repair    Date/Time: 01/04/2020 4:08 AM  Performed by: Nyra Jabs, MD  Authorized by: Wyatt Haste, MD     Consent:     Consent obtained:  Verbal    Consent given by:  Patient    Risks discussed:  Infection and pain    Alternatives discussed:  No treatment  Anesthesia (see MAR for exact dosages):     Anesthesia method:  None  Laceration details:     Location:  Scalp    Scalp location:  Crown    Length (cm):  2    Depth (mm):  2  Repair type:     Repair type:  Simple  Pre-procedure details:     Preparation:  Patient was prepped and draped in usual sterile fashion  Exploration:     Hemostasis achieved with:  Direct pressure    Wound exploration: wound explored through full range of motion    Treatment:     Area cleansed with:  Saline    Amount of cleaning:  Standard    Irrigation solution:  Sterile saline    Irrigation volume:  40    Irrigation method:  Syringe    Visualized foreign bodies/material removed: no    Skin repair:     Repair method:  Staples    Number of staples:  2  Approximation:     Approximation:  Close  Post-procedure details:     Dressing:  Open (no dressing)    Patient tolerance of procedure:  Tolerated well, no immediate complications    Nyra Jabs, MD 01/04/2020, 04:10   PGY-3 Emergency Medicine  [facility]

## 2020-01-04 NOTE — ED Nurses Note (Signed)
Discharge instructions given, pt verbalized understanding and had no questions at this time. Pt ambulated out of ED via PD.

## 2020-01-04 NOTE — ED Nurses Note (Signed)
EYE MD is at bedside.
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
## 2020-01-04 NOTE — ED Nurses Note (Signed)
Eye MD is at bedside.

## 2020-01-06 LAB — ECG 12 LEAD - ED USE
Atrial Rate: 96 {beats}/min
Calculated P Axis: 82 degrees
Calculated R Axis: 86 degrees
Calculated T Axis: 86 degrees
PR Interval: 140 ms
QRS Duration: 86 ms
QT Interval: 374 ms
QTC Calculation: 472 ms
Ventricular rate: 96 {beats}/min

## 2020-01-09 ENCOUNTER — Encounter (INDEPENDENT_AMBULATORY_CARE_PROVIDER_SITE_OTHER): Payer: Self-pay | Admitting: Otolaryngology

## 2020-01-09 ENCOUNTER — Ambulatory Visit: Payer: 59 | Attending: Allergy & Immunology | Admitting: Otolaryngology

## 2020-01-09 ENCOUNTER — Other Ambulatory Visit: Payer: Self-pay

## 2020-01-09 VITALS — BP 135/88 | HR 90 | Temp 98.7°F | Ht 69.0 in | Wt 214.7 lb

## 2020-01-09 DIAGNOSIS — S0292XA Unspecified fracture of facial bones, initial encounter for closed fracture: Secondary | ICD-10-CM | POA: Insufficient documentation
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
## 2020-01-11 ENCOUNTER — Ambulatory Visit (INDEPENDENT_AMBULATORY_CARE_PROVIDER_SITE_OTHER): Payer: BC Managed Care – PPO | Admitting: Pharmacist

## 2020-01-11 ENCOUNTER — Other Ambulatory Visit: Payer: Self-pay

## 2020-01-11 VITALS — BP 178/115 | HR 70

## 2020-01-11 DIAGNOSIS — I152 Hypertension secondary to endocrine disorders: Secondary | ICD-10-CM

## 2020-01-11 DIAGNOSIS — E1159 Type 2 diabetes mellitus with other circulatory complications: Secondary | ICD-10-CM

## 2020-01-11 MED ORDER — CHLORTHALIDONE 25 MG PO TABS: 25.0000 mg | ORAL_TABLET | Freq: Every day | ORAL | 3 refills | Status: DC

## 2020-01-11 MED ORDER — ONETOUCH DELICA LANCETS 33G MISC: 1.0000 | Freq: Every morning | 12 refills | Status: AC

## 2020-01-11 MED ORDER — ONETOUCH VERIO VI STRP: ORAL_STRIP | 12 refills | Status: DC

## 2020-01-11 MED ORDER — IRBESARTAN 300 MG PO TABS: 300.0000 mg | ORAL_TABLET | Freq: Every day | ORAL | 2 refills | Status: DC

## 2020-01-11 NOTE — Patient Instructions (Addendum)
It was good seeing you again!  We would like your blood pressure to be less than 130/80  Please continue your diltiazem 300 mg daily  Please STOP your losartan/HCTZ  Please start irbesartan 300 mg daily and chlorthalidone 25 mg daily  Please begin checking your blood sugar every morning and increasing your insulin as directed   Please call with any questions  Laural Golden, PharmD, BCACP, CDCES, CPP [facility] [address] [place], [place] [address] Phone: [phone]; Fax: [phone] 01/11/2020 3:59 PM

## 2020-01-11 NOTE — Progress Notes (Addendum)
Patient ID: Jesse Duncan                 DOB: 1985-12-21                      MRN: [id number]     HPI: Benjiman Sedgwick is a 35 y.o. male referred by Dr. [name] to HTN clinic. PMH is significant for DM, HTN, a fib, HLD and OSA.  At last visit, patient was given information to acquire a new PCP and he was stared on insulin due to A1c of 13.9.  Patient presents today in good spirits.  Has not established with a new PCP yet.  Reports compliance with medications and insulin but is not checking his blood pressure or blood sugar at home.  Believes his blood sugar is coming down because his vision has improved but stopped titrating insulin at 12 units daily.  Is trying to avoid salty foods and starches.  Keeps his PM dose of metformin in his pocket so he will not forget to take it.  Remains on diltiazem 300 mg and losartan/HCTZ 100/25.  Amlodipine previously d/c due to patient being on 2 CCB.    Remains physically active working for [employer].  Current HTN meds: losartan/hctz 100/25 daily, diltiazem 300mg  daily,  Previously tried: amlodipine 10mg  BP goal: <130/80  Current DM meds: Toujeo 12 units once daily, Jardiance 25mg  daily, Trulicity 1.5mg  weekly, metformin 1000mg  BID  Current HLD meds: rosuvastatin 40 mg daily  Wt Readings from Last 3 Encounters:  12/07/19 279 lb 6.4 oz (126.7 kg)  11/05/19 288 lb 3.2 oz (130.7 kg)  02/26/19 279 lb 6.4 oz (126.7 kg)   BP Readings from Last 3 Encounters:  12/07/19 (!) 148/76  11/05/19 (!) 142/102  02/26/19 140/90   Pulse Readings from Last 3 Encounters:  12/07/19 83  11/05/19 75  02/26/19 85    Renal function: CrCl cannot be calculated (Patient's most recent lab result is older than the maximum 21 days allowed.).  Past Medical History:  Diagnosis Date  . Allergic rhinitis due to pollen 07/06/2010  . Atrial fibrillation (HCC) 08/15/2015  . Diabetes mellitus without complication (HCC)   . Dyslipidemia   . Hyperlipidemia 05/25/2010  .  Hypertension   . Impaired fasting blood sugar 08/15/2015  . Obesity   . OSA (obstructive sleep apnea) 10/20/2015  . Sleep apnea   . Smoker   . Tobacco use disorder 11/14/2013    Current Outpatient Medications on File Prior to Visit  Medication Sig Dispense Refill  . aspirin EC 81 MG tablet Take 1 tablet (81 mg total) by mouth daily. 90 tablet 3  . diltiazem (CARDIZEM CD) 300 MG 24 hr capsule Take 1 capsule (300 mg total) by mouth daily. 90 capsule 1  . Dulaglutide (TRULICITY) 1.5 MG/0.5ML SOPN Inject 1.5 mg into the skin once a week. 6 mL 0  . empagliflozin (JARDIANCE) 25 MG TABS tablet Take 25 mg by mouth daily before breakfast. 30 tablet 5  . glucose blood test strip Test twice a day. Pt uses one touch verio flex 100 each 2  . insulin glargine, 1 Unit Dial, (TOUJEO SOLOSTAR) 300 UNIT/ML Solostar Pen Inject 10 units subcutaneously once daily. Increase by 2 units every 3 days until fasting blood sugar is less than 130.  Max 20 units a day. 4.5 mL 2  . Insulin Pen Needle (BD PEN NEEDLE NANO U/F) 32G X 4 MM MISC Use to inject insulin once daily 100 each 11  .  losartan-hydrochlorothiazide (HYZAAR) 100-25 MG tablet TAKE 1 TABLET BY MOUTH EVERY DAY 90 tablet 0  . metFORMIN (GLUCOPHAGE) 1000 MG tablet TAKE 1 TABLET (1,000 MG TOTAL) BY MOUTH 2 (TWO) TIMES DAILY WITH A MEAL. 180 tablet 0  . ONETOUCH DELICA LANCETS FINE MISC Test twice a day. Pt uses one touch verio flex 100 each 2  . rosuvastatin (CRESTOR) 40 MG tablet Take 1 tablet (40 mg total) by mouth daily. 90 tablet 3  . sildenafil (VIAGRA) 100 MG tablet Take 1 tablet (100 mg total) by mouth daily as needed for erectile dysfunction. 20 tablet 5   No current facility-administered medications on file prior to visit.    No Known Allergies   Assessment/Plan:  1. Hypertension - Patient BP in room today 178/115 (electronic cuff) which is above goal of <130/80.  Manual reading 180/118.  Will discontinue losartan/HCTZ at this time and switch to  a more potent ARB and thiazide diuretic.  Patient voiced understanding.  STOP losartan/hctz 100/25mg  START irbesartan 300mg  daily START chlorthalidone 25mg  daily  2. DM - Patient not checking his blood sugar at home because he reports he is out of test strips and lancets. Therefore stopped titrating Toujeo at 12 units daily.  Will send refill of monitoring supplies and instructed patient to resume increasing Toujeo by 2 units every 3 days until FBG <130.  Reiterated importance of establishing with PCP.  3. HLD - Continue rosuvastatin 40mg  daily  Recheck appt scheduled for 4 weeks   , PharmD, BCACP, CDCES, CPP [facility] [address] [place], [facility] [address] Phone: [phone]; Fax: [phone] 01/14/2020 8:00 AM

## 2020-01-14 ENCOUNTER — Encounter: Payer: Self-pay | Admitting: Pharmacist

## 2020-01-14 ENCOUNTER — Other Ambulatory Visit: Payer: Self-pay

## 2020-01-14 ENCOUNTER — Ambulatory Visit (HOSPITAL_BASED_OUTPATIENT_CLINIC_OR_DEPARTMENT_OTHER): Payer: 59 | Admitting: Ophthalmology

## 2020-01-14 ENCOUNTER — Encounter (INDEPENDENT_AMBULATORY_CARE_PROVIDER_SITE_OTHER): Payer: Self-pay | Admitting: Ophthalmology

## 2020-01-14 ENCOUNTER — Ambulatory Visit
Admission: RE | Admit: 2020-01-14 | Discharge: 2020-01-14 | Disposition: A | Discharge status: Home / Self Care | Payer: 59 | Source: Ambulatory Visit | Attending: Ophthalmology | Admitting: Ophthalmology

## 2020-01-14 DIAGNOSIS — S0240FD Zygomatic fracture, left side, subsequent encounter for fracture with routine healing: Secondary | ICD-10-CM

## 2020-01-14 DIAGNOSIS — S0285XA Fracture of orbit, unspecified, initial encounter for closed fracture: Secondary | ICD-10-CM

## 2020-01-14 DIAGNOSIS — S0232XD Fracture of orbital floor, left side, subsequent encounter for fracture with routine healing: Secondary | ICD-10-CM

## 2020-01-14 DIAGNOSIS — H1131 Conjunctival hemorrhage, right eye: Secondary | ICD-10-CM | POA: Insufficient documentation

## 2020-01-14 DIAGNOSIS — S0282XD Fracture of other specified skull and facial bones, left side, subsequent encounter for fracture with routine healing: Secondary | ICD-10-CM | POA: Insufficient documentation

## 2020-01-14 DIAGNOSIS — S0240DD Maxillary fracture, left side, subsequent encounter for fracture with routine healing: Secondary | ICD-10-CM

## 2020-01-14 MED ORDER — ARTIFICIAL TEARS (HYPROMELLOSE) 0.5 % EYE DROPS
1.0000 [drp] | Freq: Four times a day (QID) | OPHTHALMIC | 0 refills | Status: DC | PRN
Start: 2020-01-14 — End: 2021-06-05
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
## 2020-02-06 ENCOUNTER — Encounter (INDEPENDENT_AMBULATORY_CARE_PROVIDER_SITE_OTHER): Payer: Self-pay | Admitting: Otolaryngology

## 2020-02-08 ENCOUNTER — Ambulatory Visit: Payer: BC Managed Care – PPO

## 2020-02-15 ENCOUNTER — Other Ambulatory Visit: Payer: Self-pay

## 2020-02-15 ENCOUNTER — Ambulatory Visit (INDEPENDENT_AMBULATORY_CARE_PROVIDER_SITE_OTHER): Payer: BC Managed Care – PPO | Admitting: Pharmacist

## 2020-02-15 ENCOUNTER — Ambulatory Visit: Payer: BC Managed Care – PPO

## 2020-02-15 ENCOUNTER — Encounter (INDEPENDENT_AMBULATORY_CARE_PROVIDER_SITE_OTHER): Payer: Self-pay | Admitting: Ophthalmology

## 2020-02-15 VITALS — BP 192/110 | HR 87

## 2020-02-15 DIAGNOSIS — I152 Hypertension secondary to endocrine disorders: Secondary | ICD-10-CM | POA: Diagnosis not present

## 2020-02-15 DIAGNOSIS — E1159 Type 2 diabetes mellitus with other circulatory complications: Secondary | ICD-10-CM | POA: Diagnosis not present

## 2020-02-15 MED ORDER — CLONIDINE HCL 0.1 MG PO TABS: 0.2000 mg | ORAL_TABLET | Freq: Once | ORAL | Status: DC

## 2020-02-15 NOTE — Patient Instructions (Addendum)
Pick up irbesartan 300mg  daily and start taking TODAY!  Increase your toujeo by 2 units every 2 days until your fasting blood sugar is less than 130. You need to check your blood sugar every morning  Consider using an app like dosecast to help you remember to take your medications  Try checking your AM sugars while you let the dog out in the AM And check your blood pressure when you let the dog out in the afternoon.  Please call me at [phone] with any questions or concerns  When checking your blood pressure make sure you set everything up, place the cuff on your arm and then rest 5 min before checking. Feet flat on the floor, arm resting at heart level.  Start walking 10 min per day- increase as able  Hypertension "High blood pressure"  Hypertension is often called "The Silent Killer." It rarely causes symptoms until it is extremely  high or has done damage to other organs in the body. For this reason, you should have your  blood pressure checked regularly by your physician. We will check your blood pressure  every time you see a provider at one of our offices.   Your blood pressure reading consists of two numbers. Ideally, blood pressure should be  below 120/80. The first ("top") number is called the systolic pressure. It measures the  pressure in your arteries as your heart beats. The second ("bottom") number is called the diastolic pressure. It measures the pressure in your arteries as the heart relaxes between beats.  The benefits of getting your blood pressure under control are enormous. A 10-point  reduction in systolic blood pressure can reduce your risk of stroke by 27% and heart failure by 28%  Your blood pressure goal is <130/80  To check your pressure at home you will need to:  1. Sit up in a chair, with feet flat on the floor and back supported. Do not cross your ankles or legs. 2. Rest your left arm so that the cuff is about heart level. If the cuff goes on  your upper arm,  then just relax the arm on the table, arm of the chair or your lap. If you have a wrist cuff, we  suggest relaxing your wrist against your chest (think of it as Pledging the Flag with the  wrong arm).  3. Place the cuff snugly around your arm, about 1 inch above the crook of your elbow. The  cords should be inside the groove of your elbow.  4. Sit quietly, with the cuff in place, for about 5 minutes. After that 5 minutes press the power  button to start a reading. 5. Do not talk or move while the reading is taking place.  6. Record your readings on a sheet of paper. Although most cuffs have a memory, it is often  easier to see a pattern developing when the numbers are all in front of you.  7. You can repeat the reading after 1-3 minutes if it is recommended  Make sure your bladder is empty and you have not had caffeine or tobacco within the last 30 min  Always bring your blood pressure log with you to your appointments. If you have not brought your monitor in to be double checked for accuracy, please bring it to your next appointment.  You can find a list of validated (accurate) blood pressure cuffs at WirelessNovelties.no  Healthy Diet  SALT  What is the big deal with sodium? Why  the need to limit our intake? What is the connection to  blood pressure? And what is the difference between salt and sodium? Sodium attracts water. Think about it. When you eat an overly salty snack, you tend to become  thirsty and need more water. If you have too much sodium in your bloodstream, your body will  then pull water into the bloodstream as well, trying to correct the imbalance. When you have  more volume in the bloodstream, your blood pressure goes up. Your heart must work harder to  pump the extra volume, and the increase in pressure can wear out blood vessels faster. You may  also notice bloating and weight gain. Hypertension is one of the leading risk factors for heart  disease. By  limiting sodium intake throughout life you are helping decrease your risk of heart  disease later on.   Salt is made up of two minerals. Sodium and chloride. A teaspoon of salt contains about 40%  sodium and 60% chloride. One teaspoon of salt has 2,300 mg of sodium. While the current  USDA guideline states you should consume no more than 2,300 mg per day, both they and the  American Heart Association recommend that you limit this to 1,500 mg to stay healthy. Sea salt  or Himalayan pink salt may have a slightly different taste, but they still have almost the same  percent of sodium per teaspoon. So feel free to use them instead of table salt, but don't use  more.  A common myth is that if you don't add salt to your food, you are following a low sodium diet.  However, 75% of the sodium consumed in the American diet is from processed foods, NOT the  salt shaker. We all know that chips and crackers are high in sodium, but there are many other  foods that we may not think of when limiting our sodium. Below are the "salty six" foods that  the American Heart Association wants you to be aware of. 1. Cold cuts - even the healthy sliced [place] can have over 1,000 mg of sodium per slice.   Compare different brands to see which has less sodium if you eat these regularly 2. Pizza - depending on your toppings, a slice of pizza can have up to 760 mg of   sodium. Put more veggies on it or just have a slice with a side salad and still enjoy. 3. Soup - yes even that old home remedy of chicken soup is loaded with sodium. Look   for low sodium versions. Or add a bunch of frozen veggies when heating it, this will   give you less sodium per serving 4. Breads - they may not taste salty, but a single slice of bread can have up to 230 mg of   sodium. Toast for breakfast, a sandwich at lunch and a dinner roll can quickly add up   to over 900 mg in just one day.  5. Chicken - some fresh or frozen chicken is injected  with a sodium solution before it   reaches the store. A 4 oz serving should have no more than 100 mg sodium. And   watch for breaded frozen chicken nuggets, strips and tenders. They may seem like a   quick and easy "healthy" meal, but they have high amounts of sodium as well. 6. Burritos/tacos - just 2 teaspoons of taco seasoning can have over 400 mg sodium.   Try making your own with equal  parts of cumin, oregano, chili powder and garlic powder.   SUGAR  Sugar is a huge problem in the modern day diet. Sugar is a HUGE contributor to heart disease, diabetes, high triglyceride levels, fatty liver diease and obesity. Sugar is hidden in almost all packaged foods/beverages. It adds no nutritional benefit to your body and can cause major harm. Added sugar is extra sugar that is added beyond what is naturally found. The American Heart Association recommends limiting added sugars to no more than 25g for women and 36 grams for men per day.  There are many names for sugar maltose, sucrose (names ending in "ose"), high fructose corn syrup, molasses, cane sugar, corn sweetener, raw sugar, syrup, honey or fruit juice concentrate.   One of the best ways to limit your added sugars is to stop drinking sweetened beverages such as soda, sweet tea, fruit juice or fancy coffee's. There is 65g of added sugars in one 20oz bottle of Coke!! That is equal to 6 donuts.   Pay attention and read all nutrition facts labels. Below is an examples of a nutrition facts label. The #1 is showing you the total sugars where the # 2 is showing you the added sugars. This one severing has almost the max amount of added sugars per day!  Watch out for items that say "low fat" or "no added sugar" as these products are typically very high in sugar. The food industry uses these terms to fool you into thinking they are healthy.  For more information on the dangers of sugar watch WHY Sugar is as Bad as Alcohol (Fructose, The Liver Toxin) on  YouTube.    EXERCISE  Exercise can help lower your blood pressure ~5 points systolic (top #) and 8 points diastolic (bottom #)  Exercise is good. We've all heard that. In an ideal world, we would all have time and resources to  get plenty of it. When you are active your heart pumps more efficiently and you will feel better.  Multiple studies show that even walking regularly has benefits that include living a longer life.  The American Heart Association recommends 90-150 minutes per week of exercise (30 minutes  per day most days of the week). You can do this in any increment you wish. Nine or more  10-minute walks count. So does an hour-long exercise class. Break the time apart into what will  work in your life. Some of the best things you can do include walking briskly, jogging, cycling or  swimming laps. Not everyone is ready to "exercise." Sometimes we need to start with just getting active. Here  are some easy ways to be more active throughout the day: Marland Kitchen Take the stairs instead of the elevator . Go for a 10-15 minute walk during your lunch break (find a friend to make it more enjoyable) . When shopping, park at the back of the parking lot . If you take public transportation, get off one stop early and walk the extra distance . Pace around while making phone calls (most of [place] are not attached to phone cords any longer!) Check with your doctor if you aren't sure what your limitations may be. Always remember to drink plenty of water when doing any type of exercise. Don't feel like a failure if you're not getting the 90-150 minutes per week. If you started by being  a couch potato, then just a 10-minute walk each day is a huge improvement. Start with little  victories and work  your way up.   Healthy Eating Tips  When looking to improve your eating habits, whether to lose weight, lower blood pressure or just be healthier, it helps to know what a serving size is.   Grains 1 slice  of bread,  bagel,  cup pasta or rice  Vegetables 1 cup fresh or raw vegetables,  cup cooked or canned Fruits 1 piece of medium sized fruit,  cup canned,   Meats/Proteins  cup dried       1 oz meat, 1 egg,  cup cooked beans, nuts or seeds  Dairy        Fats Individual yogurt container, 1 cup (8oz)    1 teaspoon margarine/butter or vegetable  milk or milk alternative, 1 slice of cheese          oil; 1 tablespoon mayonnaise or salad dressing                  Plan ahead: make a menu of the meals for a week then create a grocery list to go with  that menu. Consider meals that easily stretch into a night of leftovers, such as stews or  casseroles. Or consider making two of your favorite meal and put one in the freezer for  another night. Try a night or two each week that is "meatless" or "no cook" such as salads.  When you get home from the grocery store wash and prepare your vegetables and fruits.  Then when you need them they are ready to go.  Tips for going to the grocery store: . Buy store or generic brands . Check the weekly ad from your store on-line or in their in-store flyer . Look at the unit price on the shelf tag to compare/contrast the costs of different items . Buy fruits/vegetables in season . Carrots, bananas and apples are low-cost, naturally healthy items . If meats or frozen vegetables are on sale, buy some extras and put in your freezer . Limit buying prepared or "ready to eat" items, even if they are pre-made salads or fruit snacks . Do not shop when you're hungry . Foods at eye level tend to be more expensive. Look on the high and low shelves for deals. . Consider shopping at the farmer's market for fresh foods in season. . Choose canned tuna or salmon instead of fresh . Avoid the cookie and chip aisles (these are expensive, high in calories and low in  nutritional value) Healthy food preparations: . If you can't get lean hamburger, be sure to drain the fat when  cooking . Steam, saut (in olive oil), grill or bake foods . Experiment with different seasonings to avoid adding salt to your foods. Kosher salt, sea salt and Himalayan salt are all still salt and should be avoided         Resources: American Heart Association - MartiniMobile.it Go to the Healthy Living tab to get more information American Diabetes Association - www.diabetes.org You don't have to be diabetic - check out the Food and Fitness tab  DASH diet - PopSteam.is Health topics - or just search on their home page for DASH Quit for Life - www.cancer.org Follow the Stay Healthy tab to learn more about smoking cessatio

## 2020-02-15 NOTE — Progress Notes (Signed)
Patient ID: Jesse Duncan                 DOB: Aug 14, 1985                      MRN: [id number]     HPI: Jesse Duncan is a 35 y.o. male referred by Dr. [name] to HTN clinic. PMH is significant for DM, HTN, a fib, HLD and OSA.  At last visit, patient was given information to acquire a new PCP and he was stared on insulin due to A1c of 13.9.  At last visit patient had not been checking his blood pressure at home. He was taking all his medications per his report. BP in clinic was 178/115. Losartan/HCTZ was switched to irbesartan 300mg  daily and chlorthalidone 25mg  daily.  Patient presents today accompanied by his daughter. He denies any dizziness, lightheadedness, headache, blurred vision, SOB or swelling. He works 2 jobs 6AM-3 and then 4:30-11PM M-F. He only gets a few hours of sleep each night. He only picked up one of the new medications. Is not checking his BP or the BG. Has a cuff and all his test supplies. States he has not missed many doses of medication and usually remembers a few hour later if he forgets. His only exercise is walk at work and he does not walk at any fast pace. Had to call his pharmacy to find out what medication he didn't pick up which was irbesartan.    Current HTN meds: irbesartan 300mg  daily (not taking), chlorthalidone 25mg  daily, diltiazem 300mg  daily  Previously tried: amlodipine 10mg  BP goal: <130/80  Current DM meds: Toujeo 12 units once daily, Jardiance 25mg  daily, Trulicity 1.5mg  weekly, metformin 1000mg  BID  Current HLD meds: rosuvastatin 40 mg daily  Wt Readings from Last 3 Encounters:  12/07/19 279 lb 6.4 oz (126.7 kg)  11/05/19 288 lb 3.2 oz (130.7 kg)  02/26/19 279 lb 6.4 oz (126.7 kg)   BP Readings from Last 3 Encounters:  01/11/20 (!) 178/115  12/07/19 (!) 148/76  11/05/19 (!) 142/102   Pulse Readings from Last 3 Encounters:  01/11/20 70  12/07/19 83  11/05/19 75    Renal function: CrCl cannot be calculated (Patient's most recent  lab result is older than the maximum 21 days allowed.).  Past Medical History:  Diagnosis Date  . Allergic rhinitis due to pollen 07/06/2010  . Atrial fibrillation (HCC) 08/15/2015  . Diabetes mellitus without complication (HCC)   . Dyslipidemia   . Hyperlipidemia 05/25/2010  . Hypertension   . Impaired fasting blood sugar 08/15/2015  . Obesity   . OSA (obstructive sleep apnea) 10/20/2015  . Sleep apnea   . Smoker   . Tobacco use disorder 11/14/2013    Current Outpatient Medications on File Prior to Visit  Medication Sig Dispense Refill  . aspirin EC 81 MG tablet Take 1 tablet (81 mg total) by mouth daily. 90 tablet 3  . chlorthalidone (HYGROTON) 25 MG tablet Take 1 tablet (25 mg total) by mouth daily. 90 tablet 3  . diltiazem (CARDIZEM CD) 300 MG 24 hr capsule Take 1 capsule (300 mg total) by mouth daily. 90 capsule 1  . Dulaglutide (TRULICITY) 1.5 MG/0.5ML SOPN Inject 1.5 mg into the skin once a week. 6 mL 0  . empagliflozin (JARDIANCE) 25 MG TABS tablet Take 25 mg by mouth daily before breakfast. 30 tablet 5  . glucose blood (ONETOUCH VERIO) test strip Use to test fasting blood sugar every morning 100  each 12  . insulin glargine, 1 Unit Dial, (TOUJEO SOLOSTAR) 300 UNIT/ML Solostar Pen Inject 10 units subcutaneously once daily. Increase by 2 units every 3 days until fasting blood sugar is less than 130.  Max 20 units a day. 4.5 mL 2  . Insulin Pen Needle (BD PEN NEEDLE NANO U/F) 32G X 4 MM MISC Use to inject insulin once daily 100 each 11  . irbesartan (AVAPRO) 300 MG tablet Take 1 tablet (300 mg total) by mouth daily. 90 tablet 2  . metFORMIN (GLUCOPHAGE) 1000 MG tablet TAKE 1 TABLET (1,000 MG TOTAL) BY MOUTH 2 (TWO) TIMES DAILY WITH A MEAL. 180 tablet 0  . OneTouch Delica Lancets 33G MISC 1 each by Does not apply route in the morning. 100 each 12  . rosuvastatin (CRESTOR) 40 MG tablet Take 1 tablet (40 mg total) by mouth daily. 90 tablet 3  . sildenafil (VIAGRA) 100 MG tablet Take 1  tablet (100 mg total) by mouth daily as needed for erectile dysfunction. (Patient not taking: Reported on 01/11/2020) 20 tablet 5   No current facility-administered medications on file prior to visit.    No Known Allergies   Assessment/Plan:  1. Hypertension - Blood pressure very elevated today above goal of <130/80. Patient did not pick up his chlorthalidone until 1/29. He did not pick up the irbesartan because he said the other ones he picked up on 1/29 were expensive. He does not have copay cards for his brand name drugs. I got him copay cards for his [place] and Trulicity. He is not exercising. I encouraged him to start walking 10 min a day, at a brisk pace where his heart rate and breathing increase. Talked about the benefits of blood pressure and blood sugar that exercise can have. I have asked him to check his blood pressure and blood sugar once a day. Devised a plan to check blood sugar when he lets his dog out in the AM and BP when he lets his dog out in the afternoon. I called the pharmacy and irbesartan is $25. He will pick up today and start right away. He was given 0.2mg  of clonidine in the office. Blood pressure after 20 min was the same. I spoke with Dr. Shari Prows who states since pt was asymptomatic and is going to pick up irbesartan it was ok to let him go. Patient will follow up with me in 2 weeks.  2. DM -He is only taking 10 units of Toujeo. He has testing strips but isnt checking BG. I have advised him the importance of increasing his dose. Increase Toujeo by 2 units every 3 days until FBG <130.  Reiterated importance of establishing with PCP.  3. HLD - Continue rosuvastatin 40mg  daily  Deshanna Kama D Chasen Mendell, Pharm.D, BCPS, CPP [facility]  [address] [place], [place]  Phone: [phone]; Fax: [phone]  02/15/2020 8:54 AM

## 2020-02-18 ENCOUNTER — Ambulatory Visit: Payer: 59 | Attending: Ophthalmology | Admitting: Ophthalmology

## 2020-02-18 ENCOUNTER — Ambulatory Visit (HOSPITAL_COMMUNITY)
Admission: RE | Admit: 2020-02-18 | Discharge: 2020-02-18 | Disposition: A | Discharge status: Home / Self Care | Payer: 59 | Source: Ambulatory Visit

## 2020-02-18 ENCOUNTER — Other Ambulatory Visit: Payer: Self-pay

## 2020-02-18 ENCOUNTER — Encounter (INDEPENDENT_AMBULATORY_CARE_PROVIDER_SITE_OTHER): Payer: Self-pay | Admitting: Ophthalmology

## 2020-02-18 DIAGNOSIS — S0232XD Fracture of orbital floor, left side, subsequent encounter for fracture with routine healing: Secondary | ICD-10-CM | POA: Insufficient documentation

## 2020-02-18 DIAGNOSIS — H1131 Conjunctival hemorrhage, right eye: Secondary | ICD-10-CM | POA: Insufficient documentation

## 2020-02-18 DIAGNOSIS — S0240DD Maxillary fracture, left side, subsequent encounter for fracture with routine healing: Secondary | ICD-10-CM | POA: Insufficient documentation

## 2020-02-18 DIAGNOSIS — S0282XD Fracture of other specified skull and facial bones, left side, subsequent encounter for fracture with routine healing: Secondary | ICD-10-CM | POA: Insufficient documentation

## 2020-02-18 DIAGNOSIS — H538 Other visual disturbances: Secondary | ICD-10-CM

## 2020-02-18 DIAGNOSIS — S0285XA Fracture of orbit, unspecified, initial encounter for closed fracture: Secondary | ICD-10-CM

## 2020-02-18 DIAGNOSIS — S0240FD Zygomatic fracture, left side, subsequent encounter for fracture with routine healing: Secondary | ICD-10-CM | POA: Insufficient documentation

## 2020-02-18 DIAGNOSIS — H05429 Enophthalmos due to trauma or surgery, unspecified eye: Secondary | ICD-10-CM | POA: Insufficient documentation

## 2020-02-18 DIAGNOSIS — H05422 Enophthalmos due to trauma or surgery, left eye: Secondary | ICD-10-CM
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
## 2020-02-20 ENCOUNTER — Encounter (INDEPENDENT_AMBULATORY_CARE_PROVIDER_SITE_OTHER): Payer: Self-pay | Admitting: Ophthalmology

## 2020-02-20 NOTE — Progress Notes (Signed)
Faxed over chart note from 02/18/20 office visit with Dr. [name] to [facility].

## 2020-02-27 ENCOUNTER — Ambulatory Visit: Payer: 59 | Attending: Otolaryngology | Admitting: Otolaryngology

## 2020-02-27 ENCOUNTER — Encounter (INDEPENDENT_AMBULATORY_CARE_PROVIDER_SITE_OTHER): Payer: Self-pay | Admitting: Otolaryngology

## 2020-02-27 ENCOUNTER — Other Ambulatory Visit: Payer: Self-pay

## 2020-02-27 VITALS — BP 130/90 | HR 74 | Temp 98.4°F | Ht 69.0 in | Wt 214.3 lb

## 2020-02-27 DIAGNOSIS — S0292XD Unspecified fracture of facial bones, subsequent encounter for fracture with routine healing: Secondary | ICD-10-CM | POA: Insufficient documentation

## 2020-02-28 ENCOUNTER — Ambulatory Visit (INDEPENDENT_AMBULATORY_CARE_PROVIDER_SITE_OTHER): Payer: BC Managed Care – PPO | Admitting: Pharmacist

## 2020-02-28 ENCOUNTER — Other Ambulatory Visit: Payer: Self-pay

## 2020-02-28 DIAGNOSIS — I152 Hypertension secondary to endocrine disorders: Secondary | ICD-10-CM

## 2020-02-28 DIAGNOSIS — E785 Hyperlipidemia, unspecified: Secondary | ICD-10-CM

## 2020-02-28 DIAGNOSIS — E1169 Type 2 diabetes mellitus with other specified complication: Secondary | ICD-10-CM | POA: Diagnosis not present

## 2020-02-28 DIAGNOSIS — E1159 Type 2 diabetes mellitus with other circulatory complications: Secondary | ICD-10-CM

## 2020-02-28 DIAGNOSIS — E119 Type 2 diabetes mellitus without complications: Secondary | ICD-10-CM

## 2020-02-28 MED ORDER — HYDRALAZINE HCL 25 MG PO TABS: 25.0000 mg | ORAL_TABLET | Freq: Three times a day (TID) | ORAL | 3 refills | Status: DC

## 2020-02-28 MED ORDER — EMPAGLIFLOZIN 25 MG PO TABS: 25.0000 mg | ORAL_TABLET | Freq: Every day | ORAL | 3 refills | Status: DC

## 2020-02-28 MED ORDER — ROSUVASTATIN CALCIUM 40 MG PO TABS: 40.0000 mg | ORAL_TABLET | Freq: Every day | ORAL | 3 refills | Status: DC

## 2020-02-28 MED ORDER — ONETOUCH VERIO VI STRP: ORAL_STRIP | 12 refills | Status: DC

## 2020-02-28 NOTE — Patient Instructions (Signed)
STOP taking amlodipine  Continue taking irbesartan 300mg  and diltiazem  START taking chlorthalidone 25mg  daily and hydralazine 25mg  three times a day  If you cannot find the chlorthalidone at home, please use the good rx coupon to pay cash for it  Call me at [phone] with any questions  Start checking your blood pressure at home

## 2020-02-28 NOTE — Progress Notes (Signed)
Patient ID: Jesse Duncan                 DOB: January 30, 1985                      MRN: [id number]     HPI: Rehaan Viloria is a 35 y.o. male referred by Dr. [name] to HTN clinic. PMH is significant for DM, HTN, a fib, HLD and OSA.  At last visit, patient was given information to acquire a new PCP and he was stared on insulin due to A1c of 13.9.  At last visit patient had not been checking his blood pressure at home. He had not picked up his irbesartan. Blood pressure was very elevated (192/101) in clinic. He had not been checking is blood sugar or titrating his insulin as directed.   Patient presents to clinic today for follow up. He was checking his blood sugar and increasing his long acting insulin. Up to 18 units, however he accidentally threw away his test strips and hasn't been checking. New Rx sent-but insurance may not pay for it. Not checking his BP. He brought in a bag of the medications he was taking. Patient taking amlodipine and diltiazem. Not supposed to be on amlodipine as its a duplicate therapy with his diltiazem. Is not taking chlorthalidone. At first he said he never got it. Called pharmacy and he picked it up on 1/29. Not sure where it is. Needs refills on rosuvastatin and Jardiance.  Denies dizziness, lightheadedness, headache, blurred vision, SOB, swelling or chest pain. States that sometimes his lip puffs up for a few hours. States that has been happening his whole life. Got worse when he got a dog.   Current HTN meds: irbesartan 300mg  daily, chlorthalidone 25mg  (not taking) daily, diltiazem 300mg  daily  Previously tried: amlodipine 10mg  BP goal: <130/80  Current DM meds: Toujeo 12 units once daily, Jardiance 25mg  daily, Trulicity 1.5mg  weekly, metformin 1000mg  BID  Current HLD meds: rosuvastatin 40 mg daily  Wt Readings from Last 3 Encounters:  12/07/19 279 lb 6.4 oz (126.7 kg)  11/05/19 288 lb 3.2 oz (130.7 kg)  02/26/19 279 lb 6.4 oz (126.7 kg)   BP Readings from  Last 3 Encounters:  02/15/20 (!) 192/110  01/11/20 (!) 178/115  12/07/19 (!) 148/76   Pulse Readings from Last 3 Encounters:  02/15/20 87  01/11/20 70  12/07/19 83    Renal function: CrCl cannot be calculated (Patient's most recent lab result is older than the maximum 21 days allowed.).  Past Medical History:  Diagnosis Date  . Allergic rhinitis due to pollen 07/06/2010  . Atrial fibrillation (HCC) 08/15/2015  . Diabetes mellitus without complication (HCC)   . Dyslipidemia   . Hyperlipidemia 05/25/2010  . Hypertension   . Impaired fasting blood sugar 08/15/2015  . Obesity   . OSA (obstructive sleep apnea) 10/20/2015  . Sleep apnea   . Smoker   . Tobacco use disorder 11/14/2013    Current Outpatient Medications on File Prior to Visit  Medication Sig Dispense Refill  . aspirin EC 81 MG tablet Take 1 tablet (81 mg total) by mouth daily. 90 tablet 3  . chlorthalidone (HYGROTON) 25 MG tablet Take 1 tablet (25 mg total) by mouth daily. 90 tablet 3  . diltiazem (CARDIZEM CD) 300 MG 24 hr capsule Take 1 capsule (300 mg total) by mouth daily. 90 capsule 1  . Dulaglutide (TRULICITY) 1.5 MG/0.5ML SOPN Inject 1.5 mg into the skin once a week.  6 mL 0  . empagliflozin (JARDIANCE) 25 MG TABS tablet Take 25 mg by mouth daily before breakfast. 30 tablet 5  . glucose blood (ONETOUCH VERIO) test strip Use to test fasting blood sugar every morning 100 each 12  . insulin glargine, 1 Unit Dial, (TOUJEO SOLOSTAR) 300 UNIT/ML Solostar Pen Inject 10 units subcutaneously once daily. Increase by 2 units every 3 days until fasting blood sugar is less than 130.  Max 20 units a day. 4.5 mL 2  . Insulin Pen Needle (BD PEN NEEDLE NANO U/F) 32G X 4 MM MISC Use to inject insulin once daily 100 each 11  . irbesartan (AVAPRO) 300 MG tablet Take 1 tablet (300 mg total) by mouth daily. 90 tablet 2  . metFORMIN (GLUCOPHAGE) 1000 MG tablet TAKE 1 TABLET (1,000 MG TOTAL) BY MOUTH 2 (TWO) TIMES DAILY WITH A MEAL. 180  tablet 0  . OneTouch Delica Lancets 33G MISC 1 each by Does not apply route in the morning. 100 each 12  . rosuvastatin (CRESTOR) 40 MG tablet Take 1 tablet (40 mg total) by mouth daily. 90 tablet 3  . sildenafil (VIAGRA) 100 MG tablet Take 1 tablet (100 mg total) by mouth daily as needed for erectile dysfunction. (Patient not taking: Reported on 01/11/2020) 20 tablet 5   Current Facility-Administered Medications on File Prior to Visit  Medication Dose Route Frequency Provider Last Rate Last Admin  . cloNIDine (CATAPRES) tablet 0.2 mg  0.2 mg Oral Once Maccia, Melissa D, RPH-CPP        No Known Allergies   Assessment/Plan:  1. Hypertension - Blood pressure very elevated today above goal of <130/80. Patient not taking chlorthalidone. Doesn't know where it is. I advised him to look for it at home. If he cannot find he should see if [facility] can get an insurance override, if not then I gave him a good Rx coupon to pay cash for it. Due to the severity of his HTN, he will need another agent as well. Start hydralazine 25mg  TID. Stop amlodipine due to duplicate therapy. Follow up in 2 weeks in clinic. Will need a BMP at that visit. Patient encouraged to exercise and to check his blood pressure. He will try to bring cuff to work to check BP.   2. DM - Up to 18 units of toujeo. He threw away test strips by accident. New rx sent with increased frequency directions. Hopefully insurance will pay. Refill for Jardiance sent.  3. HLD - Continue rosuvastatin 40mg  daily. New Rx sent.  , Pharm.D, BCPS, CPP [facility]  [address], [place], [address] [place]  Phone: [phone]; Fax: [phone]  02/28/2020 12:32 PM
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
## 2020-02-29 ENCOUNTER — Encounter (INDEPENDENT_AMBULATORY_CARE_PROVIDER_SITE_OTHER): Payer: Self-pay | Admitting: Ophthalmology

## 2020-03-03 ENCOUNTER — Encounter (HOSPITAL_COMMUNITY): Payer: Self-pay

## 2020-03-04 ENCOUNTER — Other Ambulatory Visit: Payer: Self-pay | Admitting: Interventional Cardiology

## 2020-03-17 ENCOUNTER — Ambulatory Visit: Payer: BC Managed Care – PPO

## 2020-03-17 NOTE — Progress Notes (Deleted)
Patient ID: Jesse Duncan                 DOB: 23-May-1985                      MRN: [id number]     HPI: Jesse Duncan is a 35 y.o. male referred by Dr. [name] to HTN clinic. PMH is significant for DM, HTN, a fib, HLD and OSA.  At last visit, patient was given information to acquire a new PCP and he was stared on insulin due to A1c of 13.9.  At last visit patient had not been checking his blood pressure at home. He was not taking chlorthalidone. Threw away his glucose test strips by accident.  Blood pressure was very elevated (164/108) in clinic. He was advised to find his chlorthalidone or to try refilling it at pharmacy. Also started on hydralazine 25mg  TID.  BMP Dizziness, lightheadedness, headache, blurred vision, SOB, swelling Home bP/HR Find chlorthalidone? Start hydralazine?  Denies dizziness, lightheadedness, headache, blurred vision, SOB, swelling or chest pain. States that sometimes his lip puffs up for a few hours. States that has been happening his whole life. Got worse when he got a dog.   Current HTN meds: irbesartan 300mg  daily, chlorthalidone 25mg  (not taking) daily, diltiazem 300mg  daily, hydralazine 25mg  TID Previously tried: amlodipine 10mg  BP goal: <130/80  Current DM meds: Toujeo 12 units once daily, Jardiance 25mg  daily, Trulicity 1.5mg  weekly, metformin 1000mg  BID  Current HLD meds: rosuvastatin 40 mg daily  Wt Readings from Last 3 Encounters:  12/07/19 279 lb 6.4 oz (126.7 kg)  11/05/19 288 lb 3.2 oz (130.7 kg)  02/26/19 279 lb 6.4 oz (126.7 kg)   BP Readings from Last 3 Encounters:  02/28/20 (!) 164/108  02/15/20 (!) 192/110  01/11/20 (!) 178/115   Pulse Readings from Last 3 Encounters:  02/15/20 87  01/11/20 70  12/07/19 83    Renal function: CrCl cannot be calculated (Patient's most recent lab result is older than the maximum 21 days allowed.).  Past Medical History:  Diagnosis Date  . Allergic rhinitis due to pollen 07/06/2010  . Atrial  fibrillation (HCC) 08/15/2015  . Diabetes mellitus without complication (HCC)   . Dyslipidemia   . Hyperlipidemia 05/25/2010  . Hypertension   . Impaired fasting blood sugar 08/15/2015  . Obesity   . OSA (obstructive sleep apnea) 10/20/2015  . Sleep apnea   . Smoker   . Tobacco use disorder 11/14/2013    Current Outpatient Medications on File Prior to Visit  Medication Sig Dispense Refill  . aspirin EC 81 MG tablet Take 1 tablet (81 mg total) by mouth daily. 90 tablet 3  . chlorthalidone (HYGROTON) 25 MG tablet Take 1 tablet (25 mg total) by mouth daily. (Patient not taking: Reported on 02/28/2020) 90 tablet 3  . diltiazem (CARDIZEM CD) 300 MG 24 hr capsule Take 1 capsule (300 mg total) by mouth daily. 90 capsule 1  . empagliflozin (JARDIANCE) 25 MG TABS tablet Take 1 tablet (25 mg total) by mouth daily before breakfast. 90 tablet 3  . glucose blood (ONETOUCH VERIO) test strip Use to test blood sugar up to 4 times a day. 100 each 12  . hydrALAZINE (APRESOLINE) 25 MG tablet Take 1 tablet (25 mg total) by mouth 3 (three) times daily. 270 tablet 3  . insulin glargine, 1 Unit Dial, (TOUJEO SOLOSTAR) 300 UNIT/ML Solostar Pen Inject 10 units subcutaneously once daily. Increase by 2 units every 3 days until  fasting blood sugar is less than 130.  Max 20 units a day. 4.5 mL 2  . Insulin Pen Needle (BD PEN NEEDLE NANO U/F) 32G X 4 MM MISC Use to inject insulin once daily (Patient taking differently: 18 Units. Use to inject insulin once daily) 100 each 11  . irbesartan (AVAPRO) 300 MG tablet Take 1 tablet (300 mg total) by mouth daily. 90 tablet 2  . metFORMIN (GLUCOPHAGE) 1000 MG tablet TAKE 1 TABLET (1,000 MG TOTAL) BY MOUTH 2 (TWO) TIMES DAILY WITH A MEAL. 180 tablet 0  . OneTouch Delica Lancets 33G MISC 1 each by Does not apply route in the morning. 100 each 12  . rosuvastatin (CRESTOR) 40 MG tablet Take 1 tablet (40 mg total) by mouth daily. 90 tablet 3  . sildenafil (VIAGRA) 100 MG tablet Take 1  tablet (100 mg total) by mouth daily as needed for erectile dysfunction. (Patient not taking: Reported on 01/11/2020) 20 tablet 5   Current Facility-Administered Medications on File Prior to Visit  Medication Dose Route Frequency Provider Last Rate Last Admin  . cloNIDine (CATAPRES) tablet 0.2 mg  0.2 mg Oral Once Lam Mccubbins D, RPH-CPP        No Known Allergies   Assessment/Plan:  1. Hypertension - Blood pressure very elevated today above goal of <130/80. Patient not taking chlorthalidone. Doesn't know where it is. I advised him to look for it at home. If he cannot find he should see if [facility] can get an insurance override, if not then I gave him a good Rx coupon to pay cash for it. Due to the severity of his HTN, he will need another agent as well. Start hydralazine 25mg  TID. Stop amlodipine due to duplicate therapy. Follow up in 2 weeks in clinic. Will need a BMP at that visit. Patient encouraged to exercise and to check his blood pressure. He will try to bring cuff to work to check BP.   2. DM - Up to 18 units of toujeo. He threw away test strips by accident. New rx sent with increased frequency directions. Hopefully insurance will pay. Refill for Jardiance sent.  3. HLD - Continue rosuvastatin 40mg  daily. New Rx sent.  , Pharm.D, BCPS, CPP [facility]  [address] [place], [address] [place]  Phone: [phone]; Fax: [phone]  03/17/2020 2:37 PM

## 2020-03-25 ENCOUNTER — Other Ambulatory Visit: Payer: Self-pay | Admitting: Interventional Cardiology

## 2020-03-25 DIAGNOSIS — E119 Type 2 diabetes mellitus without complications: Secondary | ICD-10-CM

## 2020-03-25 NOTE — Telephone Encounter (Signed)
Medication started by PharmD.  Will route to him to address.

## 2020-03-26 ENCOUNTER — Other Ambulatory Visit: Payer: Self-pay

## 2020-03-26 MED ORDER — ERYTHROMYCIN 5 MG/GRAM (0.5 %) EYE OINTMENT
TOPICAL_OINTMENT | OPHTHALMIC | 0 refills | Status: DC
Start: 2020-03-26 — End: 2020-04-01
  Filled 2020-03-26: qty 3.5, 4d supply, fill #0

## 2020-03-27 ENCOUNTER — Other Ambulatory Visit: Payer: Self-pay

## 2020-03-31 ENCOUNTER — Other Ambulatory Visit: Payer: Self-pay

## 2020-03-31 ENCOUNTER — Encounter (HOSPITAL_COMMUNITY): Admission: RE | Payer: Self-pay | Source: Ambulatory Visit | Attending: Ophthalmology

## 2020-03-31 ENCOUNTER — Inpatient Hospital Stay
Admission: RE | Admit: 2020-03-31 | Discharge: 2020-03-31 | Payer: 59 | Source: Ambulatory Visit | Attending: Ophthalmology | Admitting: Ophthalmology

## 2020-03-31 ENCOUNTER — Encounter (HOSPITAL_COMMUNITY): Payer: Self-pay

## 2020-03-31 ENCOUNTER — Encounter (HOSPITAL_COMMUNITY): Payer: Self-pay | Admitting: Ophthalmology

## 2020-03-31 ENCOUNTER — Ambulatory Visit (HOSPITAL_BASED_OUTPATIENT_CLINIC_OR_DEPARTMENT_OTHER): Payer: 59 | Admitting: Student in an Organized Health Care Education/Training Program

## 2020-03-31 ENCOUNTER — Ambulatory Visit
Admission: RE | Admit: 2020-03-31 | Discharge: 2020-03-31 | Disposition: A | Discharge status: Home / Self Care | Payer: 59 | Source: Ambulatory Visit

## 2020-03-31 ENCOUNTER — Encounter (HOSPITAL_COMMUNITY): Payer: 59 | Admitting: Ophthalmology

## 2020-03-31 ENCOUNTER — Ambulatory Visit (HOSPITAL_COMMUNITY): Payer: 59 | Admitting: Student in an Organized Health Care Education/Training Program

## 2020-03-31 DIAGNOSIS — H05422 Enophthalmos due to trauma or surgery, left eye: Secondary | ICD-10-CM

## 2020-03-31 DIAGNOSIS — H05402 Unspecified enophthalmos, left eye: Secondary | ICD-10-CM | POA: Insufficient documentation

## 2020-03-31 DIAGNOSIS — S0232XA Fracture of orbital floor, left side, initial encounter for closed fracture: Secondary | ICD-10-CM

## 2020-03-31 HISTORY — PX: HX ORBITAL SURGERY: 2100001305

## 2020-03-31 HISTORY — DX: Unspecified viral hepatitis C without hepatic coma: B19.20

## 2020-03-31 LAB — POC BLOOD GLUCOSE (RESULTS): GLUCOSE, POC: 92 mg/dL (ref 70–105)

## 2020-03-31 SURGERY — REPAIR ORBITAL FLOOR
Anesthesia: General | Site: Eye | Laterality: Left | Wound class: Clean Wound: Uninfected operative wounds in which no inflammation occurred

## 2020-03-31 MED ORDER — GELATIN SPONGE,ABSORBABLE-PORCINE SKIN 12 MM-7 MM TOPICAL SPONGE
1.0000 | VAGINAL_SPONGE | Freq: Once | CUTANEOUS | Status: DC | PRN
Start: 2020-03-31 — End: 2020-03-31

## 2020-03-31 MED ORDER — SODIUM CHLORIDE 0.9 % INJECTION SOLUTION
Freq: Once | INTRAMUSCULAR | Status: DC | PRN
Start: 2020-03-31 — End: 2020-03-31

## 2020-03-31 MED ORDER — LACTATED RINGERS INTRAVENOUS SOLUTION
INTRAVENOUS | Status: DC
Start: 2020-03-31 — End: 2020-04-01

## 2020-03-31 MED ORDER — THROMBIN (RECOMBINANT) 5,000 UNIT TOPICAL SOLUTION
Freq: Once | INTRAMUSCULAR | Status: DC | PRN
Start: 2020-03-31 — End: 2020-03-31

## 2020-03-31 MED ORDER — DEXAMETHASONE SODIUM PHOSPHATE 4 MG/ML INJECTION SOLUTION
Freq: Once | INTRAMUSCULAR | Status: DC | PRN
Start: 2020-03-31 — End: 2020-03-31
  Administered 2020-03-31: 8 mg via INTRAVENOUS

## 2020-03-31 MED ORDER — LIDOCAINE (PF) 100 MG/5 ML (2 %) INTRAVENOUS SYRINGE
INJECTION | Freq: Once | INTRAVENOUS | Status: DC | PRN
Start: 2020-03-31 — End: 2020-03-31
  Administered 2020-03-31: 100 mg via INTRAVENOUS

## 2020-03-31 MED ORDER — HYDROCODONE 5 MG-ACETAMINOPHEN 325 MG TABLET
1.0000 | ORAL_TABLET | ORAL | 0 refills | Status: DC | PRN
Start: 2020-03-31 — End: 2021-06-05

## 2020-03-31 MED ORDER — PROPARACAINE 0.5 % EYE DROPS
1.0000 [drp] | Freq: Once | OPHTHALMIC | Status: DC | PRN
Start: 2020-03-31 — End: 2020-03-31

## 2020-03-31 MED ORDER — FENTANYL (PF) 50 MCG/ML INJECTION SOLUTION
INTRAMUSCULAR | Status: AC
Start: 2020-03-31 — End: 2020-03-31
  Filled 2020-03-31: qty 2

## 2020-03-31 MED ORDER — GELATIN SPONGE,ABSORBABLE-PORCINE SKIN 12 MM-7 MM TOPICAL SPONGE
VAGINAL_SPONGE | CUTANEOUS | Status: AC
Start: 2020-03-31 — End: 2020-03-31
  Filled 2020-03-31: qty 1

## 2020-03-31 MED ORDER — SUGAMMADEX 100 MG/ML INTRAVENOUS SOLUTION
Freq: Once | INTRAVENOUS | Status: DC | PRN
Start: 2020-03-31 — End: 2020-03-31
  Administered 2020-03-31: 200 mg via INTRAVENOUS

## 2020-03-31 MED ORDER — LIDOCAINE 20 MG/ML (2 %)-EPINEPHRINE 1:100,000 INJECTION SOLUTION
15.0000 mL | Freq: Once | INTRAMUSCULAR | Status: DC | PRN
Start: 2020-03-31 — End: 2020-03-31
  Administered 2020-03-31: 11:00:00 3 mL via SUBCUTANEOUS

## 2020-03-31 MED ORDER — ERYTHROMYCIN 5 MG/GRAM (0.5 %) EYE OINTMENT
TOPICAL_OINTMENT | Freq: Three times a day (TID) | OPHTHALMIC | 0 refills | Status: AC
Start: 2020-03-31 — End: 2020-04-07

## 2020-03-31 MED ORDER — PHENYLEPHRINE 1 MG/10 ML (100 MCG/ML) IN 0.9 % SOD.CHLORIDE IV SYRINGE
INJECTION | Freq: Once | INTRAVENOUS | Status: DC | PRN
Start: 2020-03-31 — End: 2020-03-31
  Administered 2020-03-31: 50 ug via INTRAVENOUS

## 2020-03-31 MED ORDER — ROCURONIUM 10 MG/ML INTRAVENOUS SOLUTION
Freq: Once | INTRAVENOUS | Status: DC | PRN
Start: 2020-03-31 — End: 2020-03-31
  Administered 2020-03-31: 50 mg via INTRAVENOUS

## 2020-03-31 MED ORDER — CLINDAMYCIN HCL 300 MG CAPSULE
300.0000 mg | ORAL_CAPSULE | Freq: Four times a day (QID) | ORAL | 0 refills | Status: AC
Start: 2020-03-31 — End: 2020-04-07

## 2020-03-31 MED ORDER — PROPOFOL 10 MG/ML IV BOLUS
INJECTION | Freq: Once | INTRAVENOUS | Status: DC | PRN
Start: 2020-03-31 — End: 2020-03-31
  Administered 2020-03-31: 250 mg via INTRAVENOUS

## 2020-03-31 MED ORDER — SODIUM CHLORIDE 0.9 % (FLUSH) INJECTION SYRINGE
2.0000 mL | INJECTION | INTRAMUSCULAR | Status: DC | PRN
Start: 2020-03-31 — End: 2020-04-01

## 2020-03-31 MED ORDER — CLINDAMYCIN 900 MG/50 ML IN 5 % DEXTROSE INTRAVENOUS PIGGYBACK
900.0000 mg | INJECTION | Freq: Once | INTRAVENOUS | Status: AC
Start: 2020-03-31 — End: 2020-03-31
  Administered 2020-03-31: 11:00:00 900 mg via INTRAVENOUS
  Filled 2020-03-31: qty 50

## 2020-03-31 MED ORDER — ALBUTEROL SULFATE HFA 90 MCG/ACTUATION AEROSOL INHALER - RN
Freq: Once | RESPIRATORY_TRACT | Status: DC | PRN
Start: 2020-03-31 — End: 2020-03-31
  Administered 2020-03-31 (×2): 4 via RESPIRATORY_TRACT

## 2020-03-31 MED ORDER — SODIUM CHLORIDE 0.9 % (FLUSH) INJECTION SYRINGE
2.0000 mL | INJECTION | Freq: Three times a day (TID) | INTRAMUSCULAR | Status: DC
Start: 2020-03-31 — End: 2020-04-01

## 2020-03-31 MED ORDER — EPINEPHRINE HCL 100 MCG/10 ML(10 MCG/ML) IN 0.9 % SOD CHLOR IV SYRINGE
INJECTION | Freq: Once | INTRAVENOUS | Status: DC | PRN
Start: 2020-03-31 — End: 2020-03-31
  Administered 2020-03-31: 5 ug via INTRAVENOUS

## 2020-03-31 MED ORDER — HYDROMORPHONE 1 MG/ML INJECTION WRAPPER
0.2000 mg | INJECTION | INTRAMUSCULAR | Status: DC | PRN
Start: 2020-03-31 — End: 2020-04-01

## 2020-03-31 MED ORDER — KETOROLAC 30 MG/ML (1 ML) INJECTION SOLUTION
Freq: Once | INTRAMUSCULAR | Status: DC | PRN
Start: 2020-03-31 — End: 2020-03-31
  Administered 2020-03-31: 30 mg via INTRAVENOUS

## 2020-03-31 MED ORDER — HYDROMORPHONE 1 MG/ML INJECTION WRAPPER
INJECTION | INTRAMUSCULAR | Status: AC
Start: 2020-03-31 — End: 2020-03-31
  Filled 2020-03-31: qty 1

## 2020-03-31 MED ORDER — HYDROMORPHONE 1 MG/ML INJECTION WRAPPER
0.4000 mg | INJECTION | INTRAMUSCULAR | Status: DC | PRN
Start: 2020-03-31 — End: 2020-04-01

## 2020-03-31 MED ORDER — MIDAZOLAM 1 MG/ML INJECTION SOLUTION
INTRAMUSCULAR | Status: AC
Start: 2020-03-31 — End: 2020-03-31
  Filled 2020-03-31: qty 2

## 2020-03-31 MED ORDER — HYDROMORPHONE 1 MG/ML INJECTION WRAPPER
INJECTION | Freq: Once | INTRAMUSCULAR | Status: DC | PRN
Start: 2020-03-31 — End: 2020-03-31
  Administered 2020-03-31: .3 mg via INTRAVENOUS

## 2020-03-31 MED ORDER — SUGAMMADEX 100 MG/ML INTRAVENOUS SOLUTION
INTRAVENOUS | Status: AC
Start: 2020-03-31 — End: 2020-03-31
  Filled 2020-03-31: qty 2

## 2020-03-31 MED ORDER — ERYTHROMYCIN 5 MG/GRAM (0.5 %) EYE OINTMENT
TOPICAL_OINTMENT | Freq: Once | OPHTHALMIC | Status: DC | PRN
Start: 2020-03-31 — End: 2020-03-31

## 2020-03-31 MED ORDER — DEXMEDETOMIDINE 4 MCG/ML IV DILUTION
Freq: Once | INTRAMUSCULAR | Status: DC | PRN
Start: 2020-03-31 — End: 2020-03-31
  Administered 2020-03-31: 20 ug via INTRAVENOUS

## 2020-03-31 MED ORDER — FENTANYL (PF) 50 MCG/ML INJECTION SOLUTION
Freq: Once | INTRAMUSCULAR | Status: DC | PRN
Start: 2020-03-31 — End: 2020-03-31
  Administered 2020-03-31: 100 ug via INTRAVENOUS

## 2020-03-31 MED ORDER — ONDANSETRON HCL (PF) 4 MG/2 ML INJECTION SOLUTION
Freq: Once | INTRAMUSCULAR | Status: DC | PRN
Start: 2020-03-31 — End: 2020-03-31
  Administered 2020-03-31 (×2): 4 mg via INTRAVENOUS

## 2020-03-31 SURGICAL SUPPLY — 49 items
APPLICATOR COT TIP STRL 3IN 9318100 100/CS (WOUND CARE SUPPLY) ×5 IMPLANT
APPLICATOR COT TIP STRL 3IN 9318100 100/CS (WOUND CARE/ENTEROSTOMAL SUPPLY) ×5
ARMBOARD IV FM PSTNR (POSITIONING PRODUCTS) ×2
ARMBRD POSITION 20X8X2IN DVN FOAM (POSITIONING PRODUCTS) ×2 IMPLANT
BATTERY PACK ST FOR BAT DRIVER 0500000701S (BATTERIES/FLASHLIGHTS) ×1
BLANKET MISTRAL-AIR ADULT LWR BODY 55.9X40.2IN FRC AIR HI VOL BLWR INTUITIVE CONTROL PNL LRG LED (MISCELLANEOUS PT CARE ITEMS) ×1 IMPLANT
BLANKET WARMER 55.9INW X 40.2I_LOWER BODY MISTRAL-AIR (MISCELLANEOUS PT CARE ITEMS) ×1
CLOSURE SKIN STRIPS 1/2X4IN_R1547 6/PK 50PK/BX (WOUND CARE/ENTEROSTOMAL SUPPLY) ×1
CONV USE ITEM 337905 - KIT SURG MIN STUP STRL DISP LF (KITS & TRAYS (DISPOSABLE)) ×1 IMPLANT
CORD BIPOLAR 12IN SILVERGLIDE CABLE STRL LF DISP (CAUTERY SUPPLIES) ×1
CORD BIPOLAR 12IN SILVERGLIDE STR CABLE STRL LF  DISP (CAUTERY SUPPLIES) ×1 IMPLANT
COUNTER 40 CNT MAG DVN BOXLOCKS STRL LF  BLK SHARP 1840 PLASTIC FOAM XL DISP (NEEDLES & SYRINGE SUPPLIES) ×1 IMPLANT
COUNTER 40 CNT MAG DVN BXLK ST_RL LF BLK SHARP 1840 PLASTIC (NEEDLES & SYRINGE SUPPLIES) ×1
COVER WAND RFD STRL 50EA/CS 01-0020 (EQUIPMENT MINOR) ×1
COVER WND RF DETECT STRL CLR EQP (EQUIPMENT MINOR) ×1 IMPLANT
DONUT POSITION 9IN HEAD FM (SUPP) ×2
DRAPE POUCH INSTRU 2 POCKET 1018 10EA/BX (EQUIPMENT MINOR) ×1
DROPPER MED GLS 3IN 1.5ML STR PIP STRL LF (Cautery Accessories) ×1
DROPPER MED GLS STR STY PIP RUB BULB STRL DISP 3IN (Cautery Accessories) ×1
ELECTRODE ESURG BLADE PNCL 10F T VLAB STRL SS DISP BUTTON SWH (CAUTERY SUPPLIES) ×1
ELECTRODE ESURG BLADE PNCL 10FT VLAB STRL SS DISP BUTTON SWH HEX LOCK CORD HLSTR LF  ACPT 3/32IN STD (CAUTERY SUPPLIES) ×1 IMPLANT
ELECTRODE PATIENT RTN 9FT VLAB C30- LB RM PHSV ACRL FOAM CORD NONIRRITATE NONSENSITIZE ADH STRP (CAUTERY SUPPLIES) ×1 IMPLANT
ELECTRODE PATIENT RTN 9FT VLAB REM C30- LB PLHSV ACRL FOAM (CAUTERY SUPPLIES) ×1
GARMENT COMPRESS MED CALF CENTAURA NYL VASOGRAD LTWT BRTHBL SEQ FIL BLU 18- IN (ORTHOPEDICS (NOT IMPLANTS)) ×1 IMPLANT
GARMENT COMPRESS MED CALF CENT_AURA NYL VASOGRAD LTWT BRTHBL (ORTHOPEDICS (NOT IMPLANTS)) ×1
KIT MINOR SURG SET UP_CS/28 (KITS & TRAYS (DISPOSABLE)) ×1
KIT SURG MIN STUP STRL DISP LF (KITS & TRAYS (DISPOSABLE)) ×1
NEEDLE 3CM COLORADO MICRO STR HEAT RST TIP UL SHRP DSCT TUNG (NEEDLES & SYRINGE SUPPLIES) ×1
NEEDLE 3CM COLORADO MICRO STR HEAT RST TIP UL SHRP DSCT TUNG NPTN E-SEP SMOKE EVAC PNCL LF  DISP (NEEDLES & SYRINGE SUPPLIES) ×1 IMPLANT
NEEDLE HYPO  30GA .5IN REG WL PRCSNGL SS POLYPROP REG BVL LL HUB DEHP-FR TAN STRL LF  DISP (NEEDLES & SYRINGE SUPPLIES) ×1 IMPLANT
NEEDLE HYPO 30GA .5IN REG WL PRCSNGL POLYPROP REG BVL LL (NEEDLES & SYRINGE SUPPLIES) ×1
PACK BTRY LI PRECHARGE POWER DRIVER STRL DISP (BATTERIES/FLASHLIGHTS) ×1 IMPLANT
PACK EYE PREP (TRAY) ×1
PACK HEAD NECK_DYNJP7010S 6/CS (DRAPE/PACKS/SHEETS/OR TOWEL) ×1
PACK SURG CSTM EYE PREP STRL DISP LF (TRAY) ×1
PACK SURG CUSTOM EYE PREP STRL DISP LF (TRAY) ×1 IMPLANT
PACK SURG ECLIPSE EENT II TBL CVR SUT BAG HEAD TRBN DRP 90X50IN 40X27IN LF (DRAPE/PACKS/SHEETS/OR TOWEL) ×1 IMPLANT
POSITION POSITION 9IN DONUT HEAD FOAM (SUPP) ×1 IMPLANT
POUCH 11X7IN 2 ADH STRP 2 CMPRT STRDRP INSTR PLASTIC STRL (EQUIPMENT MINOR) ×1 IMPLANT
SHIELD OPTH ULTRACELL PVA LIGH T PROTECT LF 7MM CORNEAL (OPTHALMIC SUPPLIES (NOT LENS))
SHIELD OPTH ULTRACELL PVA LIGHT PROTECT LF  7MM CORNEAL (OPHTHALMIC SUPPLIES (NOT LENS)) IMPLANT
SPONGE LAP 3X.5IN 1 STNG NEURO STRP STRL (WOUND CARE SUPPLY) ×1 IMPLANT
SPONGE NEUROSURGICAL 1/2INX3IN_SNS1230 (WOUND CARE/ENTEROSTOMAL SUPPLY) ×1
STRIP 4X.5IN STRSTRP PLSTR REINF SKNCLS WHT STRL LF (WOUND CARE SUPPLY) ×1 IMPLANT
SYRINGE LL 3ML LF  STRL GRAD N-PYRG DEHP-FR PVC FREE MED DISP CLR (NEEDLES & SYRINGE SUPPLIES) ×1 IMPLANT
SYRINGE LL 3ML LF STRL SCL MRK INDIV WRP MED (NEEDLES & SYRINGE SUPPLIES) ×1
TUBING SUCT CLR 20FT 9/32IN MEDIVAC NCDTV M/M CONN STRL LF (Suction) ×1 IMPLANT
TUBING SUCT CONN 20FT LONG_STRL N720A (Suction) ×1
omnipore surgical implant sheet ×1 IMPLANT

## 2020-03-31 NOTE — Anesthesia Postprocedure Evaluation (Deleted)
Anesthesia Post Op Evaluation    Patient: [name]  Procedure(s) with comments:  REPAIR ORBITAL FLOOR - COVID 3/25 Bring   PEC @ 0815    Last Vitals:Temperature: 36.4 C (97.5 F) (03/31/20 0936)  Heart Rate: 87 (03/31/20 0936)  BP (Non-Invasive): (!) 125/90 (03/31/20 0936)  Respiratory Rate: (!) 21 (03/31/20 0936)  SpO2: 97 % (03/31/20 0936)    No complications documented.      Patient location during evaluation: PACU       Patient participation: complete - patient participated  Level of consciousness: awake    Pain management: adequate  Airway patency: patent    Anesthetic complications: no  Cardiovascular status: acceptable  Respiratory status: acceptable  Hydration status: acceptable  Patient post-procedure temperature: Pt Normothermic   PONV Status: Absent

## 2020-03-31 NOTE — Anesthesia Transfer of Care (Signed)
ANESTHESIA TRANSFER OF CARE   [name] is a 35 y.o. ,male, Weight: 97.2 kg (214 lb 4.6 oz)   had Procedure(s) with comments:  REPAIR ORBITAL FLOOR - COVID 3/25 Bring   [place] @ 9935  performed  03/31/20   Primary Service: Veto Kemps, MD    Past Medical History:   Diagnosis Date    Allergic rhinitis     Depression     Essential hypertension     Fracture of nasal bones     Headache     Nosebleed     Sinusitis     Viral hepatitis C       Allergy History as of 03/31/20     PENICILLINS       Noted Status Severity Type Reaction    12/19/11 1213 Caren Hazy, RN 12/19/11 Active                 I completed my transfer of care / handoff to the receiving personnel during which we discussed:  Access, Airway, All key/critical aspects of case discussed, Analgesia, Antibiotics, Expectation of post procedure, Fluids/Product, Gave opportunity for questions and acknowledgement of understanding, Labs and PMHx    Post Location: PACU                                          Additional Info:Patient transported to PACU with oxygen via simple facemask. Arousable to voice and breathing spontaneously. Report given to PACU RN. VSS upon release of care to PACU RN.                        Last OR Temp: Temperature: 37 C (98.6 F)  ABG:  PCO2   Date Value Ref Range Status   09/09/2013 31.0 (L) 41.0 - 51.0 mm Hg Final     PCO2 (VENOUS)   Date Value Ref Range Status   01/22/2019 35.00 (L) 41.00 - 51.00 mm/Hg Final     PO2   Date Value Ref Range Status   09/09/2013 56 (H) 35 - 50 mm Hg Final     PO2 (VENOUS)   Date Value Ref Range Status   01/22/2019 64.0 (H) 35.0 - 50.0 mm/Hg Final     SODIUM   Date Value Ref Range Status   01/22/2019 134 (L) 137 - 145 mmol/L Final   09/09/2013 142 136 - 145 mmol/L Final     POTASSIUM   Date Value Ref Range Status   01/03/2020 3.4 (L) 3.5 - 5.0 mmol/L Final     KETONES   Date Value Ref Range Status   10/19/2018 Negative Negative mg/dL Final     WHOLE BLOOD K+   Date Value Ref Range Status    09/09/2013 3.6 3.0 - 5.0 mmol/L Final     WHOLE BLOOD POTASSIUM   Date Value Ref Range Status   01/22/2019 2.9 (L) 3.5 - 4.6 mmol/L Final     CHLORIDE   Date Value Ref Range Status   01/22/2019 107 101 - 111 mmol/L Final   09/09/2013 109 96 - 111 mmol/L Final     CALCIUM   Date Value Ref Range Status   01/03/2020 9.4 8.8 - 10.3 mg/dL Final     Calculated P Axis   Date Value Ref Range Status   01/03/2020 82 degrees Final     Calculated R Axis  Date Value Ref Range Status   01/03/2020 86 degrees Final     Calculated T Axis   Date Value Ref Range Status   01/03/2020 86 degrees Final     IONIZED CALCIUM   Date Value Ref Range Status   01/22/2019 1.07 (L) 1.10 - 1.35 mmol/L Final   09/09/2013 1.08 (L) 1.30 - 1.46 mmol/L Final     LACTATE   Date Value Ref Range Status   01/22/2019 0.8 0.0 - 1.3 mmol/L Final   09/09/2013 1.9 (H) 0.9 - 1.7 mmol/L Final     HEMOGLOBIN   Date Value Ref Range Status   01/22/2019 15.2 12.0 - 18.0 g/dL Final   09/09/2013 15.0 13.5 - 18.0 g/dL Final     OXYHEMOGLOBIN   Date Value Ref Range Status   01/22/2019 92.9 (H) 40.0 - 80.0 % Final     O2HB   Date Value Ref Range Status   09/09/2013 84.2 (HH) 40.0 - 70.0 % Final     CARBOXYHEMOGLOBIN   Date Value Ref Range Status   01/22/2019 1.1 0.0 - 2.5 % Final     CARBOHYHEMOGLOBIN   Date Value Ref Range Status   09/09/2013 7.6 (HH) 0.0 - 1.5 % Final     MET-HEMOGLOBIN   Date Value Ref Range Status   01/22/2019 <0.1 0.0 - 2.0 % Final     Comment:     Value outside the measurable range.   09/09/2013 1.6 0.0 - 3.0 % Final     BASE EXCESS   Date Value Ref Range Status   01/22/2019 0.7 -3.0 - 3.0 mmol/L Final   09/09/2013 1.1 0.0 - 2.0 mmol/L Final     BASE DEFICIT   Date Value Ref Range Status   09/09/2013 Test Not Performed 0.0 - 2.0 mmol/L Final     BICARBONATE   Date Value Ref Range Status   09/09/2013 25.4 22.0 - 26.0 mmol/L Final     BICARBONATE (VENOUS)   Date Value Ref Range Status   01/22/2019 25.4 22.0 - 26.0 mmol/L Final     %FIO2   Date Value  Ref Range Status   09/09/2013 21 % Final     %FIO2 (VENOUS)   Date Value Ref Range Status   01/22/2019 21.0 % Final     Airway:* No LDAs found *  Blood pressure (!) 99/56, pulse 85, temperature 37 C (98.6 F), resp. rate 17, height 1.753 m (_0 ), weight 97.2 kg (214 lb 4.6 oz), SpO2 95 %.

## 2020-03-31 NOTE — Anesthesia Postprocedure Evaluation (Signed)
Anesthesia Post Op Evaluation    Patient: [name]  Procedure(s) with comments:  REPAIR ORBITAL FLOOR - COVID 3/25 Bring   PEC @ 0815    Last Vitals:Temperature: 37 C (98.6 F) (03/31/20 1148)  Heart Rate: 85 (03/31/20 1148)  BP (Non-Invasive): (!) 99/56 (03/31/20 1148)  Respiratory Rate: 17 (03/31/20 1148)  SpO2: 95 % (03/31/20 1148)    No complications documented.      Patient location during evaluation: PACU       Patient participation: complete - patient participated  Level of consciousness: awake    Pain management: adequate  Airway patency: patent    Anesthetic complications: no  Cardiovascular status: acceptable  Respiratory status: acceptable  Hydration status: acceptable  Patient post-procedure temperature: Pt Normothermic   PONV Status: Absent

## 2020-03-31 NOTE — Nurses Notes (Signed)
Patient adequate for D/C. Tolerating PO fluids. PIV removed. D/C instructions reviewed with patient and family - all questions answered. Patient transported to car via wheelchair.

## 2020-03-31 NOTE — H&P (Signed)
[facility]  DEPARTMENT OF OPHTHALMOLOGY - H&P UPDATE FORM    PATIENT NAME:  [name] [facility] NAME:  [id number]  DATE OF SERVICE:  03/31/2020  DATE OF BIRTH:  06/28/1985    Outpatient Pre-surgical H&P updated the day of procedure.    1.  H&P (completed within 30 days of surgical procedure by Dr. Lou Miner on 03/27/20) reviewed.    [x]  H&P assessment remains unchanged based on completion of re-assessment    [ ]  H&P assessment updated as follows:    [ ]  Respiratory    [ ]  Cardiovascular    [ ]  GI    [ ]  Neuro    [ ]  Change in meds no    Comments      2.  Patient continues to be appropriate candidate for planned surgical procedure.  yes      , MD 03/31/2020, 09:44

## 2020-03-31 NOTE — Discharge Instructions (Signed)
SURGICAL DISCHARGE INSTRUCTIONS     Dr. Stark Jock, MD  performed your REPAIR ORBITAL FLOOR today at the [facility]    [facility]:  Monday through Friday from 6 a.m. - 7 p.m.: [phone]  Between 7 p.m. - 6 a.m., weekends and holidays:  Call [facility] at [phone] or [phone].    PLEASE SEE WRITTEN HANDOUTS AS DISCUSSED BY YOUR NURSE:      SIGNS AND SYMPTOMS OF A WOUND / INCISION INFECTION   Be sure to watch for the following:   Increase in redness or red streaks near or around the wound or incision.   Increase in pain that is intense or severe and cannot be relieved by the pain medication that your doctor has given you.   Increase in swelling that cannot be relieved by elevation of a body part, or by applying ice, if permitted.   Increase in drainage, or if yellow / green in color and smells bad. This could be on a dressing or a cast.   Increase in fever for longer than 24 hours, or an increase that is higher than 101 degrees Fahrenheit (normal body temperature is 98 degrees Fahrenheit). The incision may feel warm to the touch.    **CALL YOUR DOCTOR IF ONE OR MORE OF THESE SIGNS / SYMPTOMS SHOULD OCCUR.    ANESTHESIA INFORMATION   ANESTHESIA -- ADULT PATIENTS:  You have received intravenous sedation / general anesthesia, and you may feel drowsy and light-headed for several hours. You may even experience some forgetfulness of the procedure. DO NOT DRIVE A MOTOR VEHICLE or perform any activity requiring complete alertness or coordination until you feel fully awake in about 24-48 hours. Do not drink alcoholic beverages for at least 24 hours. Do not stay alone, you must have a responsible adult available to be with you. You may also experience a dry mouth or nausea for 24 hours. This is a normal side effect and will disappear as the effects of the medication wear off.    REMEMBER   If you experience any difficulty breathing, chest pain, bleeding that you feel is  excessive, persistent nausea or vomiting or for any other concerns:  Call your physician at [phone] or [phone]. You may also ask to have the doctor on call paged. They are available to you 24 hours a day.    SPECIAL INSTRUCTIONS / COMMENTS   SEE HAND OUTS    FOLLOW-UP APPOINTMENTS   Please call patient services at [phone] or [phone] to schedule a date / time of return. They are open Monday - Friday from 7:30 am - 5:00 pm.

## 2020-03-31 NOTE — OR Surgeon (Addendum)
[facility]  DEPARTMENT OF OPHTHALMOLOGY - OPERATIVE NOTE    PATIENT NAME:  [name] [facility] NAME:  [id number]  DATE OF SERVICE:  03/31/2020  DATE OF BIRTH:  February 08, 1985    Pre-Operative Diagnosis: Orbital floor fracture, OS  Enophthalmos  Post-Operative Diagnosis: Same    Procedure(s)/Description:  1. Orbital floor fracture repair with placement of Medpor implant, OS  2. L lateral canthoplasty    Attending Surgeon: Stark Jock, MD  Assistant(s): Lennox Grumbles, DO    Anesthesia Type: General  Estimated Blood Loss:  <5cc  Blood Given: None  Fluids Given: Per anesthesia  Complications: None  Characteristic Event: None  Did the use of long term anticoagulant impact the outcome of the case? No  Wound Class: Clean  Tubes: None  Drains: None  Specimens/ Cultures: None   Implants: Omnipore implant            Disposition: PACU, then home  Condition: Stable    DESCRIPTION OF PROCEDURE:  After obtaining informed consent, the patient was brought back to the operating suite and laid supine on the operating table.  The patient was placed under general anesthesia by our anesthesia team after which the lower lid was anesthetized with 2% lidocaine with 1:200,000 dilution of epinephrine.  The face was then prepped and draped in sterile ophthalmic fashion.     Repair of orbital floor fracture:  A scleral shell was placed.  A #15 Bard-Parker blade was used to make a 4.0 mm incision from the lateral commissure in the direction of the lateral orbital rim.  Hemostasis was achieved using bipolar electrocautery.  The lateral portion of the lower lid was elevated using heavy toothed forceps while the inferior crux of the lateral canthal tendon was transected using sharp pointed scissors.  The lower lid had a 4-0 silk traction suture placed on the margin, and it was everted over a Desmarres retractor.  Monopolar cautery was used then to incise through the conjunctiva and lower lid retractors below the inferior  tarsal border for the entire horizontal length of the lid.  The conjunctiva and retractors were grasped with 0.5 forceps and carefully dissected away from the anterior lamellae.  The conjunctiva & lid retractors were suspended with a second 4-0 silk traction suture superiorly.  The orbital rim was exposed through the soft tissues, and using a malleable retractor to retract the orbital fat, the periosteum were incised over the orbital rim with monopolar cautery.  A Freer elevator was then used to elevate the periosteum off of the orbital floor.     The orbital fracture was well exposed with Glorious Peach elevators, and the prolapsed orbital tissues were freed up from the fracture site.  Once the fracture site was freed of orbital tissue, the defect was measured.  A Omnipore 1.42mm thickness implant was obtained and shaped to the appropriate size and then placed on the orbital floor.  Forced duction testing was performed, and the eye was seen to move freely in all directions.  The periosteum was closed with interrupted 4-0 Vicryl sutures over the implants.  The conjunctiva was closed with a running 5-0 plain gut suture.     Left lateral canthoplasty.  The lateral canthus was reformed with an interrupted buried 4-0 Vicryl suture.  The lateral orbicularis muscle was plicated with interrupted 4-0 Vicryl sutures.  The lateral skin was closed with interrupted 6-0 fast-absorbing gut sutures.     The scleral shell was removed.  The eye was covered  in erythromycin ointment, and the drapes were removed.  The patient tolerated the procedure well and was taken to the recovery room in stable condition where he was instructed to use erythromycin ointment to her right eye q.i.d.       Lennox Grumbles, DO  03/31/2020, 10:04  I was present for all key and/or critical portions of the case and immediately available at all times.      Stark Jock, MD 03/31/2020, 11:54            [facility]  DEPARTMENT OF OPHTHALMOLOGY -  DISCHARGE NOTE    PATIENT NAME:  [name] [facility] NAME:  [id number]  DATE OF SERVICE:  03/31/2020  DATE OF BIRTH:  1985-05-26    Patient meets discharge criteria.  Discharge to home  Keep shield in place.  RTC as scheduled or sooner prn any problem.     Lennox Grumbles, DO 03/31/2020, 11:53

## 2020-03-31 NOTE — Anesthesia Preprocedure Evaluation (Addendum)
ANESTHESIA PRE-OP EVALUATION  Planned Procedure: REPAIR ORBITAL FLOOR (Left Eye)  Review of Systems     anesthesia history negative     patient summary reviewed  nursing notes reviewed        Pulmonary    no COPD, no asthma and no rescue inhaler  no orthopnea ,  Cardiovascular    Hypertension, well controlled and ECG reviewed ,No peripheral edema,        GI/Hepatic/Renal    liver disease and + hepatitis C     Endo/Other      no type 2 diabetes,     Neuro/Psych/MS    headaches and depression no TIA and no CVA       Cancer  negative hematology/oncology ROS,                  Physical Assessment      Airway       Mallampati: II    TM distance: >3 FB    Neck ROM: full  Mouth Opening: good.  No Facial hair  No Beard        Dental           (+) poor dentition           Pulmonary    Breath sounds clear to auscultation  (-) no rhonchi, no decreased breath sounds, no wheezes, no rales and no stridor     Cardiovascular    Rhythm: regular  Rate: Normal  (-) no friction rub, carotid bruit is not present, no peripheral edema and no murmur     Other findings            Plan  ASA 2     Planned anesthesia type: general     general anesthesia with endotracheal tube intubation                  Intravenous induction     Anesthesia issues/risks discussed are: Dental Injuries, Post-op Pain Management, Stroke, Nerve Injuries, Post-op Cognitive Dysfunction, Intraoperative Awareness/ Recall, Sore Throat, Post-op Agitation/Tantrum, Aspiration, PONV and Cardiac Events/MI.  Anesthetic plan and risks discussed with patient.          Patient's NPO status is appropriate for Anesthesia.           Plan discussed with CRNA.

## 2020-04-02 ENCOUNTER — Ambulatory Visit: Payer: BC Managed Care – PPO

## 2020-04-02 NOTE — Progress Notes (Unsigned)
Patient ID: [name]                 DOB: Feb 22, 1985                      MRN: [id number]     HPI: Jesse Duncan is a 35 y.o. male referred by Dr. [name] to HTN clinic. PMH is significant for DM, HTN, a fib, HLD and OSA.  At last visit, patient was given information to acquire a new PCP and he was stared on insulin due to A1c of 13.9.  At 2/11 visit patient had not been checking his blood pressure at home. He had not picked up his irbesartan. Blood pressure was very elevated (192/101) in clinic. He had not been checking his blood sugar or titrating his insulin as directed.   At last visit on 2/24, BP was 164/108. Reported not taking chlorthalidone because he did not know where it was. Amlodipine was discontinued due to duplicate therapy with diltiazem. Restarted chlorthalidone 25 mg daily. Hydralazine 25 mg TID started. Reported checking blood sugar and increased Toujeo to 18 units daily. Provided refills on Jardiance and rosuvastatin.   Today, ***  -Denies dizziness, lightheadedness, headache, blurred vision, SOB, swelling or chest pain.  -able to pick up chlorthalidone? Taking hydral? -insulin dose now? Checking bg? -home BP/brought cuff in?  -changes in diet/exercise  Plan: bmet today, incr hydral to 50 if needed, spiro in the future? F/u 3 weeks?   Current HTN meds: irbesartan 300mg  daily, chlorthalidone 25mg  (not taking) daily, diltiazem 300mg  daily  Previously tried: amlodipine 10mg  BP goal: <130/80  Current DM meds: Toujeo 12 units once daily, Jardiance 25mg  daily, Trulicity 1.5mg  weekly, metformin 1000mg  BID  Current HLD meds: Rosuvastatin 40 mg daily  Wt Readings from Last 3 Encounters:  12/07/19 279 lb 6.4 oz (126.7 kg)  11/05/19 288 lb 3.2 oz (130.7 kg)  02/26/19 279 lb 6.4 oz (126.7 kg)   BP Readings from Last 3 Encounters:  02/28/20 (!) 164/108  02/15/20 (!) 192/110  01/11/20 (!) 178/115   Pulse Readings from Last 3 Encounters:  02/15/20 87  01/11/20  70  12/07/19 83    Renal function: CrCl cannot be calculated (Patient's most recent lab result is older than the maximum 21 days allowed.).  Past Medical History:  Diagnosis Date  . Allergic rhinitis due to pollen 07/06/2010  . Atrial fibrillation (HCC) 08/15/2015  . Diabetes mellitus without complication (HCC)   . Dyslipidemia   . Hyperlipidemia 05/25/2010  . Hypertension   . Impaired fasting blood sugar 08/15/2015  . Obesity   . OSA (obstructive sleep apnea) 10/20/2015  . Sleep apnea   . Smoker   . Tobacco use disorder 11/14/2013    Current Outpatient Medications on File Prior to Visit  Medication Sig Dispense Refill  . aspirin EC 81 MG tablet Take 1 tablet (81 mg total) by mouth daily. 90 tablet 3  . chlorthalidone (HYGROTON) 25 MG tablet Take 1 tablet (25 mg total) by mouth daily. (Patient not taking: Reported on 02/28/2020) 90 tablet 3  . diltiazem (CARDIZEM CD) 300 MG 24 hr capsule Take 1 capsule (300 mg total) by mouth daily. 90 capsule 1  . empagliflozin (JARDIANCE) 25 MG TABS tablet Take 1 tablet (25 mg total) by mouth daily before breakfast. 90 tablet 3  . glucose blood (ONETOUCH VERIO) test strip Use to test blood sugar up to 4 times a day. 100 each 12  . hydrALAZINE (APRESOLINE) 25  MG tablet Take 1 tablet (25 mg total) by mouth 3 (three) times daily. 270 tablet 3  . Insulin Pen Needle (BD PEN NEEDLE NANO U/F) 32G X 4 MM MISC Use to inject insulin once daily (Patient taking differently: 18 Units. Use to inject insulin once daily) 100 each 11  . irbesartan (AVAPRO) 300 MG tablet Take 1 tablet (300 mg total) by mouth daily. 90 tablet 2  . metFORMIN (GLUCOPHAGE) 1000 MG tablet TAKE 1 TABLET (1,000 MG TOTAL) BY MOUTH 2 (TWO) TIMES DAILY WITH A MEAL. 180 tablet 0  . OneTouch Delica Lancets 33G MISC 1 each by Does not apply route in the morning. 100 each 12  . rosuvastatin (CRESTOR) 40 MG tablet Take 1 tablet (40 mg total) by mouth daily. 90 tablet 3  . sildenafil (VIAGRA) 100 MG  tablet Take 1 tablet (100 mg total) by mouth daily as needed for erectile dysfunction. (Patient not taking: Reported on 01/11/2020) 20 tablet 5  . TOUJEO SOLOSTAR 300 UNIT/ML Solostar Pen INJECT 10 UNITS SUBCUTANEOUSLY ONCE DAILY. INCREASE BY 2 UNITS EVERY 3 DAYS UNTIL FASTING BLOOD SUGAR IS LESS THAN 130. MAX 20 UNITS A DAY. 5 mL 1   Current Facility-Administered Medications on File Prior to Visit  Medication Dose Route Frequency Provider Last Rate Last Admin  . cloNIDine (CATAPRES) tablet 0.2 mg  0.2 mg Oral Once Maccia, Melissa D, RPH-CPP        No Known Allergies   Assessment/Plan:  1. Hypertension - Blood pressure very elevated today above goal of <130/80. Patient not taking chlorthalidone. Doesn't know where it is. I advised him to look for it at home. If he cannot find he should see if [facility] can get an insurance override, if not then I gave him a good Rx coupon to pay cash for it. Due to the severity of his HTN, he will need another agent as well. Start hydralazine 25mg  TID. Stop amlodipine due to duplicate therapy. Follow up in 2 weeks in clinic. Will need a BMP at that visit. Patient encouraged to exercise and to check his blood pressure. He will try to bring cuff to work to check BP.    2. DM - Up to 18 units of toujeo. He threw away test strips by accident. New rx sent with increased frequency directions. Hopefully insurance will pay. Refill for Jardiance sent.  3. HLD - Continue rosuvastatin 40mg  daily. New Rx sent.

## 2020-04-03 NOTE — Addendum Note (Signed)
Addendum  created 04/03/20 0801 by Freeman Caldron, MD    Clinical Note Signed

## 2020-04-08 ENCOUNTER — Ambulatory Visit: Payer: 59 | Attending: Ophthalmology | Admitting: Ophthalmology

## 2020-04-08 ENCOUNTER — Encounter (INDEPENDENT_AMBULATORY_CARE_PROVIDER_SITE_OTHER): Payer: Self-pay | Admitting: Ophthalmology

## 2020-04-08 ENCOUNTER — Ambulatory Visit (HOSPITAL_COMMUNITY)
Admission: RE | Admit: 2020-04-08 | Discharge: 2020-04-08 | Disposition: A | Discharge status: Home / Self Care | Payer: 59 | Source: Ambulatory Visit

## 2020-04-08 ENCOUNTER — Other Ambulatory Visit: Payer: Self-pay

## 2020-04-08 DIAGNOSIS — Z9889 Other specified postprocedural states: Secondary | ICD-10-CM | POA: Insufficient documentation

## 2020-04-08 NOTE — Progress Notes (Addendum)
[facility]  [address]  [place] [address]  Operated by [facility]         Patient Name: [name]  MRN#: [id number]  Birthdate: 03-25-1985    Date of Service: 04/08/2020    Chief Complaint     Post Op          PETE SCHNITZER is a 35 y.o. male who presents today for evaluation/consultation of:  HPI     Pt here for POV #1 s/p Orbital floor fracture repair with placement of Medpor implant OS; L lateral canthoplasty 03-31-2020     Pt states that he has been doing well   Minimal pain, but c/o itching and irritation around temporal corner of eye (thinks there is a stitch there)   Stopped taking Tylenol yesterday and states he has had a HA since that   States va is doing better     States has been using ung TID around surgical area     Last edited by Aletta Edouard, COA on 04/08/2020 12:23 PM. (History)        ROS     Positive for: Eyes    Last edited by Aletta Edouard, COA on 04/08/2020 12:19 PM. (History)         All other systems Negative    Referring Provider:  No referring provider defined for this encounter.    Ophthalmologist/Optometrist:     MD Addition to HPI:  The patient is a 35 y.o. male s/p POV for left orbital floor fracture with placement of Medpor implant and left lateral canthoplasty. Patient states he has pain to his left eye on Upward gaze. Patient states he has a suture that is bothering him at the lateral aspect of the left eye.     Roney Jaffe, DDS 04/08/2020, 13:30        Assessment     Impression(s):      ICD-10-CM    1. Postsurgical state, eye  Z98.890 OPH EXTERNAL PHOTOS       Plan(s)/Recommendation(s):      Orbital floor fx repair with placement of Medpor implant OS; L lateral canthoplasty (03/31/2020)  - healing as expected  - no heavy lifting for at least 2-3 weeks  - no nose blowing  - RTC 4-6 weeks   - removed sutures  Imaging (CT/MRI):      I personally performed the services described in this documentation, as scribed  in my  presence, and it is both accurate  and complete.    Stark Jock, MD    I am scribing for, and in the presence of, Dr. [name] for services provided on 04/08/2020.  Rose Phi, SCRIBE   [place], SCRIBE  04/08/2020, 17:09      I saw and examined the patient.  I reviewed the technician/resident's note.  I agree with the findings and plan of care as documented in the resident's note.  Any exceptions/additions are edited/noted.    Stark Jock, MD 04/08/2020  13:30      Eye Examination:     Neuro/Psych     Oriented x3: Yes    Mood/Affect: Normal        Visual Acuity     Visual Acuity (Snellen - Linear)       Right Left    Dist sc 20/25 +2 blurry 20/40           Edited by: Aletta Edouard, COA  Not recorded       Not recorded       Not recorded       Confrontational Visual Fields     Visual Fields       Right Left     Full Full          Edited by: Stark Jock, MD            Pupils       Pupils APD    Right PERRL None    Left PERRL None        Extraocular Movement     Extraocular Movement       Right Left     Full Full          Edited by: Stark Jock, MD                               Sensation   V1: Intact bilaterally   V2: Intact bilaterally   V3: Intact bilaterally    Facial Contours:     Eyelids:    Bell's:      Lid edema GG:EZMO OS: None Conj chem OD: None QH:UTML   Lid inject OD: None OS: None Conj inject OD: None OS: None             PF   MRD                       LF   LC                       USS   LSS                    LAG            Main Ophthalmology Exam     External Exam       Right Left    External V1 hypoesthesia, V2 wnl. Infraorbital tenderness Periorbital edema, Ecchymosis          Slit Lamp Exam       Right Left    Lids/Lashes Normal edema, ecchymosis LLL, wound - c/d/i    Conjunctiva/Sclera White and quiet White and quiet    Cornea Clear Clear    Anterior Chamber Deep and quiet Deep and quiet    Iris Round and reactive Round and reactive    Lens Clear Clear          Fundus Exam       Right  Left    Vitreous Normal Normal                IOP straight Tonometry     Tonometry     Unable to assess: Yes   Deferred- POV #1           Edited byAletta Edouard, COA                Upgaze     5 down      Past Medical / Surgical / Family / Social History:      has a past medical history of Allergic rhinitis, Depression, Essential hypertension, Fracture of nasal bones, Headache, Nosebleed, Sinusitis, and Viral hepatitis C.    He has no past medical history of Angina pectoris (CMS HCC), Asthma, Awareness under anesthesia, BiPAP (biphasic positive airway pressure) dependence, Cancer (CMS HCC), Clotting disorder (CMS HCC), Congenital  anomaly of heart, Congestive heart failure (CMS HCC), Coronary artery disease, CPAP (continuous positive airway pressure) dependence, CVA (cerebrovascular accident) (CMS HCC), Deep vein thrombosis (DVT) (CMS HCC), Diabetes mellitus type 1 (CMS HCC), Dysrhythmias, Esophageal reflux, H/O hearing loss, H/O urinary tract infection, Hard to intubate, Heart murmur, History of anesthesia complications, History of kidney disease, Hyperlipidemia, Malignant hyperthermia, Neck problem, Other convulsions, PONV (postoperative nausea and vomiting), Pseudocholinesterase deficiency, Pulmonary embolism (CMS HCC), Shortness of breath, Sleep apnea, Thyroid disorder, Type 2 diabetes mellitus (CMS HCC), Type I diabetes mellitus (CMS HCC), Upper respiratory infection, Viral hepatitis B, or Wears glasses.    Past Surgical History:   Procedure Laterality Date   . HX HAND SURGERY             Family Medical History:     Problem Relation (Age of Onset)    Cancer Mother, Sister    Hypertension (High Blood Pressure) Father            Social History     Tobacco Use   . Smoking status: Former Smoker     Packs/day: 1.00     Types: Cigarettes     Quit date: 03/31/2020     Years since quitting: 0.0   . Smokeless tobacco: Never Used   Vaping Use   . Vaping Use: Never used   Substance Use Topics   . Alcohol use: No      Comment: denies   . Drug use: Yes     Types: Cocaine       I have seen and examined the above patient. I discussed the above diagnoses listed in the assessment and the above ophthalmic plan of care with the patient and patient's family. All questions were answered. I reviewed and, when necessary, made changes to the technician/resident note, documented ophthalmology exam, chief complaint, history of present illness, allergies, review of systems, past medical, past surgical, family and social history.    Stark Jock, MD 04/08/2020  13:30    Aletta Edouard, COA  04/08/2020, 12:24

## 2020-04-17 ENCOUNTER — Ambulatory Visit (INDEPENDENT_AMBULATORY_CARE_PROVIDER_SITE_OTHER): Payer: Self-pay | Admitting: Ophthalmology

## 2020-04-17 NOTE — Telephone Encounter (Signed)
Regarding: [name]  ----- Message from Yates Decamp Cramer sent at 04/17/2020  3:56 PM EDT -----  Asher Muir from the [facility] called and the Pt needs seen either 20th,22nd or 25th of April Pt is having a lot of eye pain and feels like poking him///Jamie can be reached [phone]

## 2020-04-17 NOTE — Telephone Encounter (Signed)
Spoke to [place] at [facility]. Pt is having eye pain. The soonest they can bring him in is 04/23/20. Scheduled pt in open slot for 04/23/2020 at 11:00 AM with Dr. [name]. Audry Pili, RN  04/17/2020, 16:03

## 2020-04-23 ENCOUNTER — Ambulatory Visit: Payer: 59 | Attending: Ophthalmology | Admitting: Ophthalmology

## 2020-04-23 ENCOUNTER — Encounter (INDEPENDENT_AMBULATORY_CARE_PROVIDER_SITE_OTHER): Payer: Self-pay | Admitting: Ophthalmology

## 2020-04-23 ENCOUNTER — Ambulatory Visit (HOSPITAL_COMMUNITY)
Admission: RE | Admit: 2020-04-23 | Discharge: 2020-04-23 | Disposition: A | Discharge status: Home / Self Care | Payer: 59 | Source: Ambulatory Visit

## 2020-04-23 ENCOUNTER — Other Ambulatory Visit: Payer: Self-pay

## 2020-04-23 DIAGNOSIS — Z9889 Other specified postprocedural states: Secondary | ICD-10-CM | POA: Insufficient documentation

## 2020-04-23 MED ORDER — ERYTHROMYCIN 5 MG/GRAM (0.5 %) EYE OINTMENT
TOPICAL_OINTMENT | Freq: Four times a day (QID) | OPHTHALMIC | 0 refills | Status: AC
Start: 2020-04-23 — End: 2020-04-30

## 2020-04-23 NOTE — Patient Instructions (Signed)
Removed sutures, e-mycin TID, clear to gonio

## 2020-04-23 NOTE — Progress Notes (Addendum)
[facility]  [address]  [place] [address]  Operated by [facility]    Oculoplastics & Orbital Surgery Note    04/23/2020    Patient Name: [name]    Date of Birth:  12/18/1985    Referring Provider:  No referring provider defined for this encounter.    Ophthalmologist/Optometrist:      Chief Complaint     Post-op Eye          HPI     Post-op Eye     In left eye. Additional comments: s/p Orbital floor fracture repair with placement of Medpor implant OS; L lateral canthoplasty 03-31-2020               Comments     Pt here for pain OS  s/p Orbital floor fracture repair with placement of Medpor implant OS; L lateral canthoplasty 03-31-2020   Pt states that he has pain w/ movement b/c of the knot on OS corner temporal   Pt states that he was having som discharge OS   Notes ung until 3days ago  Denies double vision, gtt, fol, floaters  HA noted   VA stable          Last edited by Diona Foley, COA on 04/23/2020  9:51 AM. (History)          ROS     Positive for: Eyes (s/p Orbital floor fracture repair with placement of Medpor implant OS; L lateral canthoplasty 03-31-2020)    Negative for: Constitutional, Gastrointestinal, Neurological, Skin, Genitourinary, Musculoskeletal, HENT, Endocrine, Cardiovascular, Respiratory, Psychiatric, Allergic/Imm, Heme/Lymph    Last edited by Diona Foley, COA on 04/23/2020  9:46 AM. (History)          History of Sun Exposure:no If yes:   Excessive sun burn:   History radiation to head:   History of skin cancer:        Past Ocular history:   Past Surgical History:   Procedure Laterality Date   . HX HAND SURGERY     . HX ORBITAL SURGERY Left 03/31/2020    Orbital fx repair os   . REPAIR ORBITAL FLOOR Left 03/31/2020    Performed by Stark Jock, MD at [facility] [address] [place], [place] 04/23/2020, 09:53    MD Addition to HPI:    The patient is a 35 y.o. male here with complaint of:    Still have some pain at lateral  canthus  No discharge    Assessment     Impression(s):      ICD-10-CM    1. Postsurgical state, eye  Z98.890 OPH EXTERNAL PHOTOS       Plan(s)/Recommendation(s):    Doing well s/p fx repair  Removed retained suture at canthus  E-mycin TID  No AR seen with gonio  Resume reg activity  F/u prn    Due to the unprecedented pandemic (COVID-19), the patient was counseled on RBA of treatment. Discussed proper hygiene, hand washing, social distancing. CDC guidelines were followed. Pt was told signs and symptoms of COVID-19, and was told to contact me or the Emergency Department if any appear.    Imaging (CT/MRI) Reviewed:   Results Reviewed:       I personally performed the services described in this documentation, as scribed  in my presence, and it is both accurate  and complete.    Diona Foley, COA    I saw  and examined the patient.  I reviewed the technician/resident's note.  I agree with the findings and plan of care as documented in the resident's note.  Any exceptions/additions are edited/noted.    Stark Jock, MD 04/23/2020  09:53      Eye Examination:       Visual Acuity     Visual Acuity (Snellen - Linear)       Right Left    Dist sc 20/25 20/30          Edited by: Diona Foley, COA            Not recorded       Not recorded       Not recorded       Confrontational Visual Fields     Visual Fields (Counting fingers)       Right Left     Full Full          Edited by: Diona Foley, COA              Extraocular Movement     Extraocular Movement       Right Left     Full, Ortho Full, Ortho   Pt notes pain in all gazes exempt left gaze           Edited by: Diona Foley, COA                    ortho           Sensation   V1: Intact bilaterally   V2: Intact bilaterally   V3: Intact bilaterally    Facial Contours:     Hertel:                            OD:                            OS:                            Base:    Eyelids:    Bell's:      Lid edema JO:ACZY OS: None Conj chem OD: None [id number]   Lid  inject OD: None OS: None Conj inject OD: None OS: None             PF   MRD                       LF   LC                       USS   LSS                    LAG               Main Ophthalmology Exam     External Exam       Right Left    External V1 hypoesthesia, V2 wnl. Infraorbital tenderness Normal          Slit Lamp Exam       Right Left    Lids/Lashes Normal Normal    Conjunctiva/Sclera White and quiet White and quiet    Cornea Clear Clear    Anterior Chamber Deep and quiet Deep and quiet    Iris Round and reactive Round and  reactive    Lens Clear Clear          Fundus Exam       Right Left    Vitreous Normal Normal                  IOP straight Tonometry     Tonometry (Tonopen, 10:00 AM)       Right Left    Pressure 17 16          Edited by: Diona Foley, COA                Upgaze     5 down      Past Medical / Surgical / Family / Social History:      has a past medical history of Allergic rhinitis, Depression, Essential hypertension, Fracture of nasal bones, Headache, Nosebleed, Sinusitis, and Viral hepatitis C.    He has no past medical history of Angina pectoris (CMS HCC), Asthma, Awareness under anesthesia, BiPAP (biphasic positive airway pressure) dependence, Cancer (CMS HCC), Clotting disorder (CMS HCC), Congenital anomaly of heart, Congestive heart failure (CMS HCC), Coronary artery disease, CPAP (continuous positive airway pressure) dependence, CVA (cerebrovascular accident) (CMS HCC), Deep vein thrombosis (DVT) (CMS HCC), Diabetes mellitus type 1 (CMS HCC), Dysrhythmias, Esophageal reflux, H/O hearing loss, H/O urinary tract infection, Hard to intubate, Heart murmur, History of anesthesia complications, History of kidney disease, Hyperlipidemia, Malignant hyperthermia, Neck problem, Other convulsions, PONV (postoperative nausea and vomiting), Pseudocholinesterase deficiency, Pulmonary embolism (CMS HCC), Shortness of breath, Sleep apnea, Thyroid disorder, Type 2 diabetes mellitus (CMS HCC), Type I  diabetes mellitus (CMS HCC), Upper respiratory infection, Viral hepatitis B, or Wears glasses.    Past Surgical History:   Procedure Laterality Date   . HX HAND SURGERY     . HX ORBITAL SURGERY Left 03/31/2020    Orbital fx repair os           Family Medical History:     Problem Relation (Age of Onset)    Cancer Mother, Sister    Hypertension (High Blood Pressure) Father            Social History     Tobacco Use   . Smoking status: Former Smoker     Packs/day: 1.00     Types: Cigarettes     Quit date: 03/31/2020     Years since quitting: 0.0   . Smokeless tobacco: Never Used   Vaping Use   . Vaping Use: Never used   Substance Use Topics   . Alcohol use: No     Comment: denies   . Drug use: Yes     Types: Cocaine       I have seen and examined the above patient. I discussed the above diagnoses listed in the assessment and the above ophthalmic plan of care with the patient and patient's family. All questions were answered. I reviewed and, when necessary, made changes to the technician/resident note, documented ophthalmology exam, chief complaint, history of present illness, allergies, review of systems, past medical, past surgical, family and social history.    Stark Jock, MD 04/23/2020  09:53

## 2020-04-29 ENCOUNTER — Encounter (INDEPENDENT_AMBULATORY_CARE_PROVIDER_SITE_OTHER): Payer: Self-pay | Admitting: Ophthalmology

## 2020-05-09 DIAGNOSIS — Z20822 Contact with and (suspected) exposure to covid-19: Secondary | ICD-10-CM | POA: Diagnosis not present

## 2020-05-20 ENCOUNTER — Encounter (INDEPENDENT_AMBULATORY_CARE_PROVIDER_SITE_OTHER): Payer: Self-pay | Admitting: Ophthalmology

## 2020-06-02 ENCOUNTER — Other Ambulatory Visit: Payer: Self-pay | Admitting: Interventional Cardiology

## 2021-01-30 ENCOUNTER — Other Ambulatory Visit: Payer: Self-pay

## 2021-03-16 ENCOUNTER — Other Ambulatory Visit: Payer: Self-pay

## 2021-03-16 ENCOUNTER — Emergency Department (HOSPITAL_COMMUNITY): Payer: BC Managed Care – PPO

## 2021-03-16 ENCOUNTER — Encounter (HOSPITAL_COMMUNITY): Payer: Self-pay

## 2021-03-16 ENCOUNTER — Emergency Department (HOSPITAL_COMMUNITY)
Admission: EM | Admit: 2021-03-16 | Discharge: 2021-03-16 | Disposition: A | Discharge status: Home / Self Care | Payer: BC Managed Care – PPO | Attending: Emergency Medicine | Admitting: Emergency Medicine

## 2021-03-16 DIAGNOSIS — E1169 Type 2 diabetes mellitus with other specified complication: Secondary | ICD-10-CM

## 2021-03-16 DIAGNOSIS — Z794 Long term (current) use of insulin: Secondary | ICD-10-CM | POA: Insufficient documentation

## 2021-03-16 DIAGNOSIS — Z7984 Long term (current) use of oral hypoglycemic drugs: Secondary | ICD-10-CM | POA: Insufficient documentation

## 2021-03-16 DIAGNOSIS — R0789 Other chest pain: Secondary | ICD-10-CM | POA: Diagnosis not present

## 2021-03-16 DIAGNOSIS — Z79899 Other long term (current) drug therapy: Secondary | ICD-10-CM | POA: Diagnosis not present

## 2021-03-16 DIAGNOSIS — E119 Type 2 diabetes mellitus without complications: Secondary | ICD-10-CM | POA: Diagnosis not present

## 2021-03-16 DIAGNOSIS — I10 Essential (primary) hypertension: Secondary | ICD-10-CM | POA: Diagnosis not present

## 2021-03-16 DIAGNOSIS — E785 Hyperlipidemia, unspecified: Secondary | ICD-10-CM

## 2021-03-16 DIAGNOSIS — Z0389 Encounter for observation for other suspected diseases and conditions ruled out: Secondary | ICD-10-CM | POA: Diagnosis not present

## 2021-03-16 DIAGNOSIS — R0602 Shortness of breath: Secondary | ICD-10-CM | POA: Diagnosis not present

## 2021-03-16 DIAGNOSIS — R079 Chest pain, unspecified: Secondary | ICD-10-CM | POA: Diagnosis not present

## 2021-03-16 LAB — CBC
HCT: 44.8 % (ref 39.0–52.0)
Hemoglobin: 14.3 g/dL (ref 13.0–17.0)
MCH: 26 pg (ref 26.0–34.0)
MCHC: 31.9 g/dL (ref 30.0–36.0)
MCV: 81.6 fL (ref 80.0–100.0)
Platelets: 293 10*3/uL (ref 150–400)
RBC: 5.49 MIL/uL (ref 4.22–5.81)
RDW: 12.1 % (ref 11.5–15.5)
WBC: 4.8 10*3/uL (ref 4.0–10.5)
nRBC: 0 % (ref 0.0–0.2)

## 2021-03-16 LAB — HEPATIC FUNCTION PANEL
ALT: 19 U/L (ref 0–44)
AST: 18 U/L (ref 15–41)
Albumin: 3.5 g/dL (ref 3.5–5.0)
Alkaline Phosphatase: 76 U/L (ref 38–126)
Bilirubin, Direct: 0.2 mg/dL (ref 0.0–0.2)
Indirect Bilirubin: 0.7 mg/dL (ref 0.3–0.9)
Total Bilirubin: 0.9 mg/dL (ref 0.3–1.2)
Total Protein: 6.8 g/dL (ref 6.5–8.1)

## 2021-03-16 LAB — BASIC METABOLIC PANEL
Anion gap: 12 (ref 5–15)
BUN: 12 mg/dL (ref 6–20)
CO2: 25 mmol/L (ref 22–32)
Calcium: 9.6 mg/dL (ref 8.9–10.3)
Chloride: 99 mmol/L (ref 98–111)
Creatinine, Ser: 1.02 mg/dL (ref 0.61–1.24)
GFR, Estimated: >60 mL/min (ref >60)
Glucose, Bld: 328 mg/dL — ABNORMAL HIGH (ref 70–99)
Potassium: 4.2 mmol/L (ref 3.5–5.1)
Sodium: 136 mmol/L (ref 135–145)

## 2021-03-16 LAB — TROPONIN I (HIGH SENSITIVITY)
Troponin I (High Sensitivity): 10 ng/L (ref <18)
Troponin I (High Sensitivity): 10 ng/L (ref <18)

## 2021-03-16 MED ORDER — HYDRALAZINE HCL 25 MG PO TABS: 25.0000 mg | ORAL_TABLET | Freq: Three times a day (TID) | ORAL | 0 refills | Status: DC

## 2021-03-16 MED ORDER — ROSUVASTATIN CALCIUM 40 MG PO TABS: 40.0000 mg | ORAL_TABLET | Freq: Every day | ORAL | 0 refills | Status: DC

## 2021-03-16 MED ORDER — METFORMIN HCL 1000 MG PO TABS: 1000.0000 mg | ORAL_TABLET | Freq: Two times a day (BID) | ORAL | 0 refills | Status: DC

## 2021-03-16 MED ORDER — IOHEXOL 350 MG/ML SOLN
100.0000 mL | Freq: Once | INTRAVENOUS | Status: AC | PRN
  Administered 2021-03-16: 100 mL via INTRAVENOUS

## 2021-03-16 MED ORDER — HYDRALAZINE HCL 25 MG PO TABS
25.0000 mg | ORAL_TABLET | Freq: Once | ORAL | Status: AC
  Administered 2021-03-16: 25 mg via ORAL
  Filled 2021-03-16: qty 1

## 2021-03-16 MED ORDER — DILTIAZEM HCL ER COATED BEADS 300 MG PO CP24: ORAL_CAPSULE | ORAL | 1 refills | Status: DC

## 2021-03-16 NOTE — ED Provider Notes (Signed)
[facility] EMERGENCY DEPARTMENT Provider Note   CSN: [id number] Arrival date & time: 03/16/21  4742     History  Chief Complaint  Patient presents with   Chest Pain    Jesse Duncan is a 36 y.o. male with a past medical history of hypertension, diabetes, hyperlipidemia, A-fib presenting to the ED with a chief complaint of chest pain.  Reports sharp, L sided chest pain that woke him up from his sleep approximately 1-1/2 hours ago.  Symptoms lasted for about 30 minutes for they resolved on their own.  He reported associated shortness of breath at that time.  States that he has had similar symptoms several years ago which similarly improved.  He denies any abdominal pain, nausea, vomiting, fever, cough, leg swelling, recent immobilization, history of DVT, PE, MI. He admits that he has not been compliant with any of his home medications for several months as he states that he does not currently have a primary care provider to provide him with any refills   Chest Pain Associated symptoms: shortness of breath   Associated symptoms: no abdominal pain, no cough, no dizziness, no fever, no nausea, no palpitations, no vomiting and no weakness       Home Medications Prior to Admission medications   Medication Sig Start Date End Date Taking? Authorizing Provider  diltiazem (CARDIZEM CD) 300 MG 24 hr capsule TAKE 1 CAPSULE BY MOUTH EVERY DAY 03/16/21   Vandora Jaskulski, PA-C  empagliflozin (JARDIANCE) 25 MG TABS tablet Take 1 tablet (25 mg total) by mouth daily before breakfast. Patient not taking: Reported on 03/16/2021 02/28/20   Lyn Records, MD  glucose blood [facility] VERIO) test strip Use to test blood sugar up to 4 times a day. 02/28/20   Lyn Records, MD  hydrALAZINE (APRESOLINE) 25 MG tablet Take 1 tablet (25 mg total) by mouth 3 (three) times daily. 03/16/21 04/15/21  Valoree Agent, PA-C  Insulin Pen Needle (BD PEN NEEDLE NANO U/F) 32G X 4 MM MISC Use to inject insulin  once daily Patient taking differently: 18 Units. Use to inject insulin once daily 12/11/19   Lyn Records, MD  metFORMIN (GLUCOPHAGE) 1000 MG tablet Take 1 tablet (1,000 mg total) by mouth 2 (two) times daily with a meal. 03/16/21 04/15/21  Zierra Laroque, PA-C  rosuvastatin (CRESTOR) 40 MG tablet Take 1 tablet (40 mg total) by mouth daily. 03/16/21 04/15/21  Makia Bossi, PA-C  TOUJEO SOLOSTAR 300 UNIT/ML Solostar Pen INJECT 10 UNITS SUBCUTANEOUSLY ONCE DAILY. INCREASE BY 2 UNITS EVERY 3 DAYS UNTIL FASTING BLOOD SUGAR IS LESS THAN 130. MAX 20 UNITS A DAY. Patient not taking: Reported on 03/16/2021 03/25/20   Lyn Records, MD      Allergies    Patient has no known allergies.    Review of Systems   Review of Systems  Constitutional:  Negative for appetite change, chills and fever.  HENT:  Negative for ear pain, rhinorrhea, sneezing and sore throat.   Eyes:  Negative for photophobia and visual disturbance.  Respiratory:  Positive for shortness of breath. Negative for cough, chest tightness and wheezing.   Cardiovascular:  Positive for chest pain. Negative for palpitations.  Gastrointestinal:  Negative for abdominal pain, blood in stool, constipation, diarrhea, nausea and vomiting.  Genitourinary:  Negative for dysuria, hematuria and urgency.  Musculoskeletal:  Negative for myalgias.  Skin:  Negative for rash.  Neurological:  Negative for dizziness, weakness and light-headedness.   Physical Exam Updated Vital Signs  BP (!) 160/96    Pulse 61    Temp 98.6 F (37 C) (Oral)    Resp 13    Ht  (1.854 m)    Wt 124.3 kg    SpO2 98%    BMI 36.15 kg/m  Physical Exam Vitals and nursing note reviewed.  Constitutional:      General: He is not in acute distress.    Appearance: He is well-developed.     Comments: Speaking complete sentences without difficulty  HENT:     Head: Normocephalic and atraumatic.     Nose: Nose normal.  Eyes:     General: No scleral icterus.       Left eye: No  discharge.     Conjunctiva/sclera: Conjunctivae normal.  Cardiovascular:     Rate and Rhythm: Normal rate and regular rhythm.     Heart sounds: Normal heart sounds. No murmur heard.   No friction rub. No gallop.  Pulmonary:     Effort: Pulmonary effort is normal. No respiratory distress.     Breath sounds: Normal breath sounds.  Abdominal:     General: Bowel sounds are normal. There is no distension.     Palpations: Abdomen is soft.     Tenderness: There is no abdominal tenderness. There is no guarding.  Musculoskeletal:        General: Normal range of motion.     Cervical back: Normal range of motion and neck supple.     Right lower leg: No edema.     Left lower leg: No edema.     Comments: No lower extremity edema, erythema or calf tenderness bilaterally.  2+ DP pulse noted bilaterally.  Skin:    General: Skin is warm and dry.     Findings: No rash.  Neurological:     Mental Status: He is alert.     Motor: No abnormal muscle tone.     Coordination: Coordination normal.    ED Results / Procedures / Treatments   Labs (all labs ordered are listed, but only abnormal results are displayed) Labs Reviewed  BASIC METABOLIC PANEL - Abnormal; Notable for the following components:      Result Value   Glucose, Bld 328 (*)    All other components within normal limits  CBC  HEPATIC FUNCTION PANEL  TROPONIN I (HIGH SENSITIVITY)  TROPONIN I (HIGH SENSITIVITY)    EKG EKG Interpretation  Date/Time:  Monday March 16 2021 06:12:08 EDT Ventricular Rate:  63 PR Interval:  157 QRS Duration: 109 QT Interval:  429 QTC Calculation: 440 R Axis:   1 Text Interpretation: Sinus rhythm No significant change since last tracing Confirmed by Alvira Monday ([id number]) on 03/16/2021 10:47:17 AM  Radiology DG Chest 2 View  Result Date: 03/16/2021 CLINICAL DATA:  Chest pain and shortness of breath. EXAM: CHEST - 2 VIEW COMPARISON:  None. FINDINGS: The heart size and mediastinal contours are within  normal limits. Both lungs are clear. The visualized skeletal structures are unremarkable. IMPRESSION: No active cardiopulmonary disease. Electronically Signed   By: Signa Kell M.D.   On: 03/16/2021 06:34   CT Angio Chest/Abd/Pel for Dissection W and/or W/WO  Result Date: 03/16/2021 CLINICAL DATA:  Chest pain. EXAM: CT ANGIOGRAPHY CHEST, ABDOMEN AND PELVIS TECHNIQUE: Non-contrast CT of the chest was initially obtained. Multidetector CT imaging through the chest, abdomen and pelvis was performed using the standard protocol during bolus administration of intravenous contrast. Multiplanar reconstructed images and MIPs were obtained and reviewed to evaluate  the vascular anatomy. RADIATION DOSE REDUCTION: This exam was performed according to the departmental dose-optimization program which includes automated exposure control, adjustment of the mA and/or kV according to patient size and/or use of iterative reconstruction technique. CONTRAST:  OMNIPAQUE IOHEXOL 350 MG/ML SOLN COMPARISON:  None. FINDINGS: CTA CHEST FINDINGS Cardiovascular: Preferential opacification of the thoracic aorta. No evidence of thoracic aortic aneurysm or dissection. Mild cardiac enlargement.  No pericardial effusion identified. Mediastinum/Nodes: No enlarged mediastinal, hilar, or axillary lymph nodes. Thyroid gland, trachea, and esophagus demonstrate no significant findings. Lungs/Pleura: Lungs are clear. No pleural effusion or pneumothorax. Musculoskeletal: No chest wall abnormality. No acute or significant osseous findings. Review of the MIP images confirms the above findings. CTA ABDOMEN AND PELVIS FINDINGS VASCULAR Aorta: Normal caliber aorta without aneurysm, dissection, vasculitis or significant stenosis. Celiac: Patent without evidence of aneurysm, dissection, vasculitis or significant stenosis. SMA: Patent without evidence of aneurysm, dissection, vasculitis or significant stenosis. Renals: Both renal arteries are patent  without evidence of aneurysm, dissection, vasculitis, fibromuscular dysplasia or significant stenosis. IMA: Patent without evidence of aneurysm, dissection, vasculitis or significant stenosis. Inflow: Patent without evidence of aneurysm, dissection, vasculitis or significant stenosis. Veins: No obvious venous abnormality within the limitations of this arterial phase study. Review of the MIP images confirms the above findings. NON-VASCULAR Hepatobiliary: No focal liver abnormality is seen. No gallstones, gallbladder wall thickening, or biliary dilatation. Pancreas: Unremarkable. No pancreatic ductal dilatation or surrounding inflammatory changes. Spleen: Normal in size without focal abnormality. Adrenals/Urinary Tract: Normal adrenal glands. No kidney mass or hydronephrosis identified. Bladder is unremarkable. Stomach/Bowel: Stomach appears within normal limits. No bowel wall thickening, inflammation, or distension. The appendix is visualized and is normal. Lymphatic: No abdominopelvic adenopathy. Reproductive: Prostate is unremarkable. Other: No free fluid or fluid collection within the abdomen or pelvis. No signs of pneumoperitoneum. Musculoskeletal: No acute or significant osseous findings. Review of the MIP images confirms the above findings. IMPRESSION: 1. No evidence of aortic aneurysm or dissection. 2. No acute findings within the chest, abdomen or pelvis to explain patient's chest pain. 3. Mild cardiac enlargement. Electronically Signed   By: Signa Kell M.D.   On: 03/16/2021 09:47    Procedures Procedures    Medications Ordered in ED Medications  hydrALAZINE (APRESOLINE) tablet 25 mg (25 mg Oral Given 03/16/21 0839)  iohexol (OMNIPAQUE) 350 MG/ML injection 100 mL (100 mLs Intravenous Contrast Given 03/16/21 0931)    ED Course/ Medical Decision Making/ A&P Clinical Course as of 03/16/21 1048  Mon Mar 16, 2021  0751 WBC: 4.8 [HK]  0822 Troponin I (High Sensitivity): 10 [HK]  0822 Glucose(!):  328 [HK]  0822 Anion gap: 12 [HK]  0822 Troponin I (High Sensitivity): 10 [HK]    Clinical Course User Index [HK] Dietrich Pates, PA-C                           Medical Decision Making Amount and/or Complexity of Data Reviewed Labs: ordered. Decision-making details documented in ED Course. Radiology: ordered.  Risk Prescription drug management.   36yo M presenting to the ED with 30-minute episode of left-sided chest pain that woke him up from his sleep approximately 1/2 hours ago.  States that his symptoms have resolved spontaneously.  He reports associated shortness of breath at the time.  He admits that he has been noncompliant with all of his home medications for several months.  On exam patient in no acute distress.  He is hypertensive upon  arrival to 190 systolic.  His lungs are clear to auscultation bilaterally.  He has no lower extremity edema, erythema or calf tenderness noted concerning for DVT.  His chest is nontender.  He is not tachycardic, tachypneic or hypoxic.  EKG shows sinus rhythm, no ischemic changes.  Chest x-ray shows no acute findings.  Will await lab work and reassess.  CT angio dissection study done due to patient's symptoms and hypertension here.  This shows no evidence of dissection or other abnormalities in the chest, abdomen and pelvis.  CBC, BMP, LFTs are unremarkable.  Troponins are negative x2.  He has been completely asymptomatic upon arrival to the ER and has not required any pain medication here.  His blood pressure has improved with hydralazine given here.  I suspect that he will need to be back on his chronic medications including for hypertension, hyperlipidemia as well as diabetes.  I urged him to establish care with a primary care provider to provide refills and to make sure his symptoms are improving.  I suspect that this could be in part due to his uncontrolled hypertension. Doubt ACS, PE as he is PERC negative.  No evidence of dissection.  Patient is  agreeable to the plan and is requesting discharge home as he states that he feels better.  Return precautions given.  Patient is hemodynamically stable, in NAD, and able to ambulate in the ED. Evaluation does not show pathology that would require ongoing emergent intervention or inpatient treatment. I explained the diagnosis to the patient. Pain has been managed and has no complaints prior to discharge. Patient is comfortable with above plan and is stable for discharge at this time. All questions were answered prior to disposition. Strict return precautions for returning to the ED were discussed. Encouraged follow up with PCP.   An After Visit Summary was printed and given to the patient.   Portions of this note were generated with Scientist, clinical (histocompatibility and immunogenetics)Dragon dictation software. Dictation errors may occur despite best attempts at proofreading.         Final Clinical Impression(s) / ED Diagnoses Final diagnoses:  Chest wall pain  Hypertension, unspecified type    Rx / DC Orders ED Discharge Orders          Ordered    hydrALAZINE (APRESOLINE) 25 MG tablet  3 times daily        03/16/21 1044    metFORMIN (GLUCOPHAGE) 1000 MG tablet  2 times daily with meals        03/16/21 1044    rosuvastatin (CRESTOR) 40 MG tablet  Daily        03/16/21 1044    diltiazem (CARDIZEM CD) 300 MG 24 hr capsule        03/16/21 1044              Dietrich PatesKhatri, Merlean Pizzini, PA-C 03/16/21 1048    Alvira MondaySchlossman, Erin, MD 03/16/21 2326

## 2021-03-16 NOTE — ED Notes (Signed)
Pt transported to CT ?

## 2021-03-16 NOTE — Discharge Instructions (Addendum)
Your blood pressure was elevated today. ?I will restart you on the medications that you appear to be on chronically. ?It is important for you to establish care with a primary care provider and I have referred you to the clinic listed below. ?Make sure you are taking these medications on a regular basis. ?Return to the ER if you start to experience worsening symptoms, increased chest pain, shortness of breath, leg swelling, uncontrollable vomiting. ? ?

## 2021-03-16 NOTE — ED Triage Notes (Signed)
Pt reports being awoken from sleep with L sided CP and SOB. Pain has since resolved. Pt in NAD during triage. Pt has hx of a-fib, HTN, non-compliant with medications.  ?

## 2021-03-16 NOTE — ED Notes (Signed)
;  POF @0626  ? ? ?

## 2021-03-16 NOTE — ED Notes (Signed)
Patient transported to X-ray 

## 2021-06-02 ENCOUNTER — Ambulatory Visit (INDEPENDENT_AMBULATORY_CARE_PROVIDER_SITE_OTHER): Payer: BC Managed Care – PPO | Admitting: Internal Medicine

## 2021-06-02 ENCOUNTER — Encounter: Payer: Self-pay | Admitting: Internal Medicine

## 2021-06-02 ENCOUNTER — Other Ambulatory Visit: Payer: Self-pay | Admitting: Internal Medicine

## 2021-06-02 VITALS — BP 156/98 | HR 81 | Temp 98.9°F | Resp 16 | Ht 73.0 in | Wt 265.0 lb

## 2021-06-02 DIAGNOSIS — E1169 Type 2 diabetes mellitus with other specified complication: Secondary | ICD-10-CM | POA: Diagnosis not present

## 2021-06-02 DIAGNOSIS — I10 Essential (primary) hypertension: Secondary | ICD-10-CM

## 2021-06-02 DIAGNOSIS — E119 Type 2 diabetes mellitus without complications: Secondary | ICD-10-CM

## 2021-06-02 DIAGNOSIS — G4733 Obstructive sleep apnea (adult) (pediatric): Secondary | ICD-10-CM

## 2021-06-02 DIAGNOSIS — E785 Hyperlipidemia, unspecified: Secondary | ICD-10-CM

## 2021-06-02 DIAGNOSIS — E7801 Familial hypercholesterolemia: Secondary | ICD-10-CM | POA: Diagnosis not present

## 2021-06-02 DIAGNOSIS — Z794 Long term (current) use of insulin: Secondary | ICD-10-CM

## 2021-06-02 DIAGNOSIS — Z0001 Encounter for general adult medical examination with abnormal findings: Secondary | ICD-10-CM | POA: Diagnosis not present

## 2021-06-02 LAB — URINALYSIS, ROUTINE W REFLEX MICROSCOPIC
Bilirubin Urine: NEGATIVE
Hgb urine dipstick: NEGATIVE
Ketones, ur: NEGATIVE
Leukocytes,Ua: NEGATIVE
Nitrite: NEGATIVE
RBC / HPF: NONE SEEN (ref 0–?)
Specific Gravity, Urine: <=1.005 — AB (ref 1.000–1.030)
Total Protein, Urine: NEGATIVE
Urine Glucose: >=1000 — AB
Urobilinogen, UA: 0.2 (ref 0.0–1.0)
WBC, UA: NONE SEEN (ref 0–?)
pH: 6 (ref 5.0–8.0)

## 2021-06-02 LAB — CBC WITH DIFFERENTIAL/PLATELET
Basophils Absolute: 0 10*3/uL (ref 0.0–0.1)
Basophils Relative: 0.3 % (ref 0.0–3.0)
Eosinophils Absolute: 0.1 10*3/uL (ref 0.0–0.7)
Eosinophils Relative: 1.5 % (ref 0.0–5.0)
HCT: 40.9 % (ref 39.0–52.0)
Hemoglobin: 13.7 g/dL (ref 13.0–17.0)
Lymphocytes Relative: 46 % (ref 12.0–46.0)
Lymphs Abs: 2.5 10*3/uL (ref 0.7–4.0)
MCHC: 33.4 g/dL (ref 30.0–36.0)
MCV: 78.9 fl (ref 78.0–100.0)
Monocytes Absolute: 0.3 10*3/uL (ref 0.1–1.0)
Monocytes Relative: 5.7 % (ref 3.0–12.0)
Neutro Abs: 2.5 10*3/uL (ref 1.4–7.7)
Neutrophils Relative %: 46.5 % (ref 43.0–77.0)
Platelets: 319 10*3/uL (ref 150.0–400.0)
RBC: 5.18 Mil/uL (ref 4.22–5.81)
RDW: 13.7 % (ref 11.5–15.5)
WBC: 5.4 10*3/uL (ref 4.0–10.5)

## 2021-06-02 LAB — LIPID PANEL
Cholesterol: 336 mg/dL — ABNORMAL HIGH (ref 0–200)
HDL: 48.6 mg/dL (ref >39.00)
NonHDL: 287.04
Total CHOL/HDL Ratio: 7
Triglycerides: 317 mg/dL — ABNORMAL HIGH (ref 0.0–149.0)
VLDL: 63.4 mg/dL — ABNORMAL HIGH (ref 0.0–40.0)

## 2021-06-02 LAB — HEPATIC FUNCTION PANEL
ALT: 14 U/L (ref 0–53)
AST: 13 U/L (ref 0–37)
Albumin: 4.5 g/dL (ref 3.5–5.2)
Alkaline Phosphatase: 86 U/L (ref 39–117)
Bilirubin, Direct: 0.1 mg/dL (ref 0.0–0.3)
Total Bilirubin: 0.4 mg/dL (ref 0.2–1.2)
Total Protein: 8 g/dL (ref 6.0–8.3)

## 2021-06-02 LAB — BASIC METABOLIC PANEL
BUN: 13 mg/dL (ref 6–23)
CO2: 26 mEq/L (ref 19–32)
Calcium: 10.2 mg/dL (ref 8.4–10.5)
Chloride: 98 mEq/L (ref 96–112)
Creatinine, Ser: 0.96 mg/dL (ref 0.40–1.50)
GFR: 102.77 mL/min (ref >60.00)
Glucose, Bld: 377 mg/dL — ABNORMAL HIGH (ref 70–99)
Potassium: 4 mEq/L (ref 3.5–5.1)
Sodium: 133 mEq/L — ABNORMAL LOW (ref 135–145)

## 2021-06-02 LAB — MICROALBUMIN / CREATININE URINE RATIO
Creatinine,U: 40.7 mg/dL
Microalb Creat Ratio: 8.1 mg/g (ref 0.0–30.0)
Microalb, Ur: 3.3 mg/dL — ABNORMAL HIGH (ref 0.0–1.9)

## 2021-06-02 LAB — HEMOGLOBIN A1C: Hgb A1c MFr Bld: 13.9 % — ABNORMAL HIGH (ref 4.6–6.5)

## 2021-06-02 LAB — TSH: TSH: 1.47 u[IU]/mL (ref 0.35–5.50)

## 2021-06-02 LAB — LDL CHOLESTEROL, DIRECT: Direct LDL: 207 mg/dL

## 2021-06-02 MED ORDER — HUMALOG KWIKPEN 200 UNIT/ML ~~LOC~~ SOPN: 5.0000 [IU] | PEN_INJECTOR | Freq: Three times a day (TID) | SUBCUTANEOUS | 1 refills | Status: DC

## 2021-06-02 MED ORDER — INSULIN ASPART (W/NIACINAMIDE) 100 UNIT/ML ~~LOC~~ SOPN: 5.0000 [IU] | PEN_INJECTOR | Freq: Three times a day (TID) | SUBCUTANEOUS | 1 refills | Status: DC

## 2021-06-02 MED ORDER — RYBELSUS 3 MG PO TABS: 3.0000 mg | ORAL_TABLET | Freq: Every day | ORAL | 0 refills | Status: DC

## 2021-06-02 MED ORDER — METFORMIN HCL ER 750 MG PO TB24: 1500.0000 mg | ORAL_TABLET | Freq: Every day | ORAL | 0 refills | Status: DC

## 2021-06-02 MED ORDER — BD PEN NEEDLE NANO U/F 32G X 4 MM MISC: 1.0000 | Freq: Four times a day (QID) | 1 refills | Status: DC

## 2021-06-02 MED ORDER — ROSUVASTATIN CALCIUM 40 MG PO TABS: 40.0000 mg | ORAL_TABLET | Freq: Every day | ORAL | 1 refills | Status: DC

## 2021-06-02 MED ORDER — HYDRALAZINE HCL 50 MG PO TABS: 50.0000 mg | ORAL_TABLET | Freq: Three times a day (TID) | ORAL | 0 refills | Status: DC

## 2021-06-02 MED ORDER — DEXCOM G7 RECEIVER DEVI: 1.0000 | Freq: Every day | 1 refills | Status: DC

## 2021-06-02 MED ORDER — TOUJEO SOLOSTAR 300 UNIT/ML ~~LOC~~ SOPN: 50.0000 [IU] | PEN_INJECTOR | Freq: Every day | SUBCUTANEOUS | 0 refills | Status: DC

## 2021-06-02 MED ORDER — DEXCOM G7 SENSOR MISC: 1.0000 | Freq: Every day | 1 refills | Status: DC

## 2021-06-02 NOTE — Patient Instructions (Signed)
Hypertension, Adult High blood pressure (hypertension) is when the force of blood pumping through the arteries is too strong. The arteries are the blood vessels that carry blood from the heart throughout the body. Hypertension forces the heart to work harder to pump blood and may cause arteries to become narrow or stiff. Untreated or uncontrolled hypertension can lead to a heart attack, heart failure, a stroke, kidney disease, and other problems. A blood pressure reading consists of a higher number over a lower number. Ideally, your blood pressure should be below 120/80. The first ("top") number is called the systolic pressure. It is a measure of the pressure in your arteries as your heart beats. The second ("bottom") number is called the diastolic pressure. It is a measure of the pressure in your arteries as the heart relaxes. What are the causes? The exact cause of this condition is not known. There are some conditions that result in high blood pressure. What increases the risk? Certain factors may make you more likely to develop high blood pressure. Some of these risk factors are under your control, including: Smoking. Not getting enough exercise or physical activity. Being overweight. Having too much fat, sugar, calories, or salt (sodium) in your diet. Drinking too much alcohol. Other risk factors include: Having a personal history of heart disease, diabetes, high cholesterol, or kidney disease. Stress. Having a family history of high blood pressure and high cholesterol. Having obstructive sleep apnea. Age. The risk increases with age. What are the signs or symptoms? High blood pressure may not cause symptoms. Very high blood pressure (hypertensive crisis) may cause: Headache. Fast or irregular heartbeats (palpitations). Shortness of breath. Nosebleed. Nausea and vomiting. Vision changes. Severe chest pain, dizziness, and seizures. How is this diagnosed? This condition is diagnosed by  measuring your blood pressure while you are seated, with your arm resting on a flat surface, your legs uncrossed, and your feet flat on the floor. The cuff of the blood pressure monitor will be placed directly against the skin of your upper arm at the level of your heart. Blood pressure should be measured at least twice using the same arm. Certain conditions can cause a difference in blood pressure between your right and left arms. If you have a high blood pressure reading during one visit or you have normal blood pressure with other risk factors, you may be asked to: Return on a different day to have your blood pressure checked again. Monitor your blood pressure at home for 1 week or longer. If you are diagnosed with hypertension, you may have other blood or imaging tests to help your health care provider understand your overall risk for other conditions. How is this treated? This condition is treated by making healthy lifestyle changes, such as eating healthy foods, exercising more, and reducing your alcohol intake. You may be referred for counseling on a healthy diet and physical activity. Your health care provider may prescribe medicine if lifestyle changes are not enough to get your blood pressure under control and if: Your systolic blood pressure is above 130. Your diastolic blood pressure is above 80. Your personal target blood pressure may vary depending on your medical conditions, your age, and other factors. Follow these instructions at home: Eating and drinking  Eat a diet that is high in fiber and potassium, and low in sodium, added sugar, and fat. An example of this eating plan is called the DASH diet. DASH stands for Dietary Approaches to Stop Hypertension. To eat this way: Eat   plenty of fresh fruits and vegetables. Try to fill one half of your plate at each meal with fruits and vegetables. Eat whole grains, such as whole-wheat pasta, brown rice, or whole-grain bread. Fill about one  fourth of your plate with whole grains. Eat or drink low-fat dairy products, such as skim milk or low-fat yogurt. Avoid fatty cuts of meat, processed or cured meats, and poultry with skin. Fill about one fourth of your plate with lean proteins, such as fish, chicken without skin, beans, eggs, or tofu. Avoid pre-made and processed foods. These tend to be higher in sodium, added sugar, and fat. Reduce your daily sodium intake. Many people with hypertension should eat less than 1,500 mg of sodium a day. Do not drink alcohol if: Your health care provider tells you not to drink. You are pregnant, may be pregnant, or are planning to become pregnant. If you drink alcohol: Limit how much you have to: 0-1 drink a day for women. 0-2 drinks a day for men. Know how much alcohol is in your drink. In the [place] one drink equals one 12 oz bottle of beer (355 mL), one 5 oz glass of wine (148 mL), or one 1 oz glass of hard liquor (44 mL). Lifestyle  Work with your health care provider to maintain a healthy body weight or to lose weight. Ask what an ideal weight is for you. Get at least 30 minutes of exercise that causes your heart to beat faster (aerobic exercise) most days of the week. Activities may include walking, swimming, or biking. Include exercise to strengthen your muscles (resistance exercise), such as Pilates or lifting weights, as part of your weekly exercise routine. Try to do these types of exercises for 30 minutes at least 3 days a week. Do not use any products that contain nicotine or tobacco. These products include cigarettes, chewing tobacco, and vaping devices, such as e-cigarettes. If you need help quitting, ask your health care provider. Monitor your blood pressure at home as told by your health care provider. Keep all follow-up visits. This is important. Medicines Take over-the-counter and prescription medicines only as told by your health care provider. Follow directions carefully. Blood  pressure medicines must be taken as prescribed. Do not skip doses of blood pressure medicine. Doing this puts you at risk for problems and can make the medicine less effective. Ask your health care provider about side effects or reactions to medicines that you should watch for. Contact a health care provider if you: Think you are having a reaction to a medicine you are taking. Have headaches that keep coming back (recurring). Feel dizzy. Have swelling in your ankles. Have trouble with your vision. Get help right away if you: Develop a severe headache or confusion. Have unusual weakness or numbness. Feel faint. Have severe pain in your chest or abdomen. Vomit repeatedly. Have trouble breathing. These symptoms may be an emergency. Get help right away. Call 911. Do not wait to see if the symptoms will go away. Do not drive yourself to the hospital. Summary Hypertension is when the force of blood pumping through your arteries is too strong. If this condition is not controlled, it may put you at risk for serious complications. Your personal target blood pressure may vary depending on your medical conditions, your age, and other factors. For most people, a normal blood pressure is less than 120/80. Hypertension is treated with lifestyle changes, medicines, or a combination of both. Lifestyle changes include losing weight, eating a healthy,   low-sodium diet, exercising more, and limiting alcohol. This information is not intended to replace advice given to you by your health care provider. Make sure you discuss any questions you have with your health care provider. Document Revised: 10/28/2020 Document Reviewed: 10/28/2020 Elsevier Patient Education  2023 [employer]  

## 2021-06-02 NOTE — Progress Notes (Unsigned)
Subjective:  Patient ID: Jesse Duncan, male    DOB: 11-18-1985  Age: 36 y.o. MRN: [id number]  CC: Annual Exam, Hypertension, Hyperlipidemia, and Diabetes   HPI Jesse Duncan presents for a CPX and to establish.  History Jesse Duncan has a past medical history of Allergic rhinitis due to pollen (07/06/2010), Atrial fibrillation ([place]) (08/15/2015), Diabetes mellitus without complication ([place]), Dyslipidemia, Hyperlipidemia (05/25/2010), Hypertension, Impaired fasting blood sugar (08/15/2015), Obesity, OSA (obstructive sleep apnea) (10/20/2015), Sleep apnea, Smoker, and Tobacco use disorder (11/14/2013).   He has no past surgical history on file.   His family history includes COPD in his father; Cancer in his maternal grandfather; Healthy in his father; Hypertension in his mother; Stroke in his maternal grandmother.He reports that he has been smoking cigars and e-cigarettes. He has been exposed to tobacco smoke. He has never used smokeless tobacco. He reports that he does not currently use alcohol after a past usage of about 1.0 standard drink per week. He reports that he does not use drugs.  Outpatient Medications Prior to Visit  Medication Sig Dispense Refill   diltiazem (CARDIZEM CD) 300 MG 24 hr capsule TAKE 1 CAPSULE BY MOUTH EVERY DAY 90 capsule 1   empagliflozin (JARDIANCE) 25 MG TABS tablet Take 1 tablet (25 mg total) by mouth daily before breakfast. 90 tablet 3   glucose blood (ONETOUCH VERIO) test strip Use to test blood sugar up to 4 times a day. 100 each 12   Insulin Pen Needle (BD PEN NEEDLE NANO U/F) 32G X 4 MM MISC Use to inject insulin once daily (Patient taking differently: 18 Units. Use to inject insulin once daily) 100 each 11   hydrALAZINE (APRESOLINE) 25 MG tablet Take 1 tablet (25 mg total) by mouth 3 (three) times daily. 90 tablet 0   metFORMIN (GLUCOPHAGE) 1000 MG tablet Take 1 tablet (1,000 mg total) by mouth 2 (two) times daily with a meal. 60 tablet 0    rosuvastatin (CRESTOR) 40 MG tablet Take 1 tablet (40 mg total) by mouth daily. 30 tablet 0   TOUJEO SOLOSTAR 300 UNIT/ML Solostar Pen INJECT 10 UNITS SUBCUTANEOUSLY ONCE DAILY. INCREASE BY 2 UNITS EVERY 3 DAYS UNTIL FASTING BLOOD SUGAR IS LESS THAN 130. MAX 20 UNITS A DAY. (Patient not taking: Reported on 03/16/2021) 5 mL 1   cloNIDine (CATAPRES) tablet 0.2 mg      No facility-administered medications prior to visit.    ROS Review of Systems  Objective:  BP (!) 156/98 (BP Location: Right Arm, Patient Position: Sitting, Cuff Size: Large) Comment: BP (R) 156/98 (L) 162/100  Pulse 81   Temp 98.9 F (37.2 C) (Oral)   Resp 16   Ht 6\' 1"  (1.854 m)   Wt 265 lb (120.2 kg)   SpO2 97%   BMI 34.96 kg/m   Physical Exam Cardiovascular:     Comments: EKG- NSR, 72 bpm No LVH Normal EKG Musculoskeletal:     Right lower leg: No edema.     Left lower leg: No edema.    Lab Results  Component Value Date   WBC 5.4 06/02/2021   HGB 13.7 06/02/2021   HCT 40.9 06/02/2021   PLT 319.0 06/02/2021   GLUCOSE 377 (H) 06/02/2021   CHOL 336 (H) 06/02/2021   TRIG 317.0 (H) 06/02/2021   HDL 48.60 06/02/2021   LDLDIRECT 207.0 06/02/2021   LDLCALC 202 (H) 12/04/2018   ALT 14 06/02/2021   AST 13 06/02/2021   NA 133 (L) 06/02/2021   K 4.0 06/02/2021  CL 98 06/02/2021   CREATININE 0.96 06/02/2021   BUN 13 06/02/2021   CO2 26 06/02/2021   TSH 1.47 06/02/2021   PSA 1.57 10/18/2012   HGBA1C 13.9 Repeated and verified X2. (H) 06/02/2021   MICROALBUR 3.3 (H) 06/02/2021     Assessment & Plan:   Jesse Duncan was seen today for annual exam, hypertension, hyperlipidemia and diabetes.  Diagnoses and all orders for this visit:  Encounter for general adult medical examination with abnormal findings  Hyperlipidemia associated with type 2 diabetes mellitus ([place]) -     Lipid panel; Future -     TSH; Future -     Hepatic function panel; Future -     Hepatic function panel -     TSH -     Lipid  panel -     rosuvastatin (CRESTOR) 40 MG tablet; Take 1 tablet (40 mg total) by mouth daily.  Insulin-requiring or dependent type II diabetes mellitus ([place]) -     Basic metabolic panel; Future -     Microalbumin / creatinine urine ratio; Future -     Hemoglobin A1c; Future -     HM Diabetes Foot Exam -     Hemoglobin A1c -     Microalbumin / creatinine urine ratio -     Basic metabolic panel -     insulin glargine, 1 Unit Dial, (TOUJEO SOLOSTAR) 300 UNIT/ML Solostar Pen; Inject 50 Units into the skin daily. -     Discontinue: insulin lispro (HUMALOG KWIKPEN) 200 UNIT/ML KwikPen; Inject 5 Units into the skin with breakfast, with lunch, and with evening meal. -     Continuous Blood Gluc Sensor (DEXCOM G7 SENSOR) MISC; 1 Act by Does not apply route daily. -     Continuous Blood Gluc Receiver ([place]) [place]; 1 Act by Does not apply route daily. -     metFORMIN (GLUCOPHAGE-XR) 750 MG 24 hr tablet; Take 2 tablets (1,500 mg total) by mouth daily with breakfast. -     Semaglutide (RYBELSUS) 3 MG TABS; Take 3 mg by mouth daily. -     Amb Referral to Nutrition and Diabetic Education -     Discontinue: insulin aspart (FIASP) 100 UNIT/ML FlexTouch Pen; Inject 5 Units into the skin 3 (three) times daily with meals. -     Insulin Pen Needle (BD PEN NEEDLE NANO U/F) 32G X 4 MM MISC; 1 Act by Subdermal route in the morning, at noon, in the evening, and at bedtime. Use to inject insulin once daily -     insulin aspart (FIASP) 100 UNIT/ML FlexTouch Pen; Inject 5 Units into the skin 3 (three) times daily with meals.  Primary hypertension -     Basic metabolic panel; Future -     CBC with Differential/Platelet; Future -     Aldosterone + renin activity w/ ratio; Future -     TSH; Future -     Urinalysis, Routine w reflex microscopic; Future -     Hepatic function panel; Future -     EKG 12-Lead -     Hepatic function panel -     Urinalysis, Routine w reflex microscopic -     TSH -      Aldosterone + renin activity w/ ratio -     CBC with Differential/Platelet -     Basic metabolic panel -     hydrALAZINE (APRESOLINE) 50 MG tablet; Take 1 tablet (50 mg total) by mouth 3 (three) times daily.  Familial hypercholesteremia -     rosuvastatin (CRESTOR) 40 MG tablet; Take 1 tablet (40 mg total) by mouth daily.  Type 2 diabetes mellitus without complication, without long-term current use of insulin (HCC)  OSA (obstructive sleep apnea)  Other orders -     LDL cholesterol, direct   I have discontinued Jesse Duncan's empagliflozin, OneTouch Verio, hydrALAZINE, metFORMIN, and HumaLOG KwikPen. I have also changed his Toujeo SoloStar and BD Pen Needle Nano U/F. Additionally, I am having him start on Dexcom G7 Sensor, Dexcom G7 Receiver, metFORMIN, Rybelsus, and hydrALAZINE. Lastly, I am having him maintain his diltiazem, rosuvastatin, and insulin aspart. We will stop administering cloNIDine.  Meds ordered this encounter  Medications   rosuvastatin (CRESTOR) 40 MG tablet    Sig: Take 1 tablet (40 mg total) by mouth daily.    Dispense:  90 tablet    Refill:  1   insulin glargine, 1 Unit Dial, (TOUJEO SOLOSTAR) 300 UNIT/ML Solostar Pen    Sig: Inject 50 Units into the skin daily.    Dispense:  15 mL    Refill:  0   DISCONTD: insulin lispro (HUMALOG KWIKPEN) 200 UNIT/ML KwikPen    Sig: Inject 5 Units into the skin with breakfast, with lunch, and with evening meal.    Dispense:  9 mL    Refill:  1   Continuous Blood Gluc Sensor (DEXCOM G7 SENSOR) MISC    Sig: 1 Act by Does not apply route daily.    Dispense:  9 each    Refill:  1   Continuous Blood Gluc Receiver ([place]) DEVI    Sig: 1 Act by Does not apply route daily.    Dispense:  9 each    Refill:  1   metFORMIN (GLUCOPHAGE-XR) 750 MG 24 hr tablet    Sig: Take 2 tablets (1,500 mg total) by mouth daily with breakfast.    Dispense:  180 tablet    Refill:  0   Semaglutide (RYBELSUS) 3 MG TABS    Sig:  Take 3 mg by mouth daily.    Dispense:  30 tablet    Refill:  0   DISCONTD: insulin aspart (FIASP) 100 UNIT/ML FlexTouch Pen    Sig: Inject 5 Units into the skin 3 (three) times daily with meals.    Dispense:  15 mL    Refill:  1   hydrALAZINE (APRESOLINE) 50 MG tablet    Sig: Take 1 tablet (50 mg total) by mouth 3 (three) times daily.    Dispense:  270 tablet    Refill:  0   Insulin Pen Needle (BD PEN NEEDLE NANO U/F) 32G X 4 MM MISC    Sig: 1 Act by Subdermal route in the morning, at noon, in the evening, and at bedtime. Use to inject insulin once daily    Dispense:  400 each    Refill:  1   insulin aspart (FIASP) 100 UNIT/ML FlexTouch Pen    Sig: Inject 5 Units into the skin 3 (three) times daily with meals.    Dispense:  15 mL    Refill:  1     Follow-up: Return in about 6 weeks (around 07/14/2021).  Scarlette Calico, MD

## 2021-06-05 ENCOUNTER — Ambulatory Visit (INDEPENDENT_AMBULATORY_CARE_PROVIDER_SITE_OTHER): Payer: 59 | Admitting: NURSE PRACTITIONER

## 2021-06-05 ENCOUNTER — Encounter (INDEPENDENT_AMBULATORY_CARE_PROVIDER_SITE_OTHER): Payer: Self-pay | Admitting: NURSE PRACTITIONER

## 2021-06-05 ENCOUNTER — Other Ambulatory Visit: Payer: Self-pay

## 2021-06-05 VITALS — BP 134/78 | HR 77 | Temp 96.9°F | Resp 18 | Ht 69.0 in | Wt 219.2 lb

## 2021-06-05 DIAGNOSIS — F17210 Nicotine dependence, cigarettes, uncomplicated: Secondary | ICD-10-CM

## 2021-06-05 DIAGNOSIS — Z13220 Encounter for screening for lipoid disorders: Secondary | ICD-10-CM

## 2021-06-05 DIAGNOSIS — Z0000 Encounter for general adult medical examination without abnormal findings: Secondary | ICD-10-CM

## 2021-06-05 DIAGNOSIS — Z6832 Body mass index (BMI) 32.0-32.9, adult: Secondary | ICD-10-CM

## 2021-06-05 DIAGNOSIS — F32A Depression, unspecified: Secondary | ICD-10-CM

## 2021-06-05 DIAGNOSIS — K1370 Unspecified lesions of oral mucosa: Secondary | ICD-10-CM

## 2021-06-05 NOTE — Progress Notes (Signed)
[facility], [facility]  [address]  [place] [id number]    History and Physical     Name: [name] MRN:  [id number]   Date: 06/05/2021 Age: 36 y.o.            General Exam    Subjective:    [name] is an 36 y.o. male who is here for a new patient appointment to establish care.  Previous PCP was unknown.  Last received medical care while in prison.  Was released approximately 3 months ago.  List is only allergy is penicillin.  Surgical, social, family history as listed below.  Defers Tdap today.    He tells me he has a knot on his lip that he 1st noticed 6 months ago, continues to let larger in size.  Reports it is painful.  He does tell me he seen a dentist in prison but did not have the lesion at that time.  He reports he is a smoker and has been smoking for the past 2 months.  Denies chewing tobacco.  He does have a history of depression is currently not on medication.  He tells me he is doing well off of medication.  He denies suicidal homicidal ideations.    Allergies   Allergen Reactions   . Penicillins      Family Medical History:     Problem Relation (Age of Onset)    Cancer Mother, Sister    Hypertension (High Blood Pressure) Father          No current outpatient medications on file.  Past Medical History:   Diagnosis Date   . Allergic rhinitis    . Depression    . Essential hypertension    . Fracture of nasal bones    . Headache    . Nosebleed    . Sinusitis    . Viral hepatitis C          Past Surgical History:   Procedure Laterality Date   . HX HAND SURGERY     . HX ORBITAL SURGERY Left 03/31/2020    Orbital fx repair os         Social History     Socioeconomic History   . Marital status: Single     Spouse name: Not on file   . Number of children: Not on file   . Years of education: Not on file   . Highest education level: Not on file   Occupational History   . Not on file   Tobacco Use   . Smoking status: Every Day     Packs/day: 1.00     Types:  Cigarettes     Last attempt to quit: 03/31/2020     Years since quitting: 1.1     Passive exposure: Current   . Smokeless tobacco: Never   Vaping Use   . Vaping Use: Never used   Substance and Sexual Activity   . Alcohol use: No     Comment: denies   . Drug use: Not Currently     Types: Cocaine   . Sexual activity: Not on file   Other Topics Concern   . Abuse/Domestic Violence Not Asked   . Caffeine Concern Not Asked   . Calcium intake adequate Not Asked   . Computer Use Not Asked   . Drives Not Asked   . Exercise Concern Not Asked   . Helmet Use Not Asked   . Seat  Belt Not Asked   . Special Diet Not Asked   . Sunscreen used Not Asked   . Uses Cane Not Asked   . Uses walker Not Asked   . Uses wheelchair Not Asked   . Right hand dominant Not Asked   . Left hand dominant Not Asked   . Ambidextrous Not Asked   . Shift Work Not Asked   . Unusual Sleep-Wake Schedule Not Asked   . Ability to Walk 1 Flight of Steps without SOB/CP Not Asked   . Routine Exercise Yes     Comment: cardio    . Ability to Walk 2 Flight of Steps without SOB/CP Yes   . Unable to Ambulate Not Asked   . Total Care Not Asked   . Ability To Do Own ADL's Yes   . Uses Walker Not Asked   . Other Activity Level No   . Uses Cane Not Asked   Social History Narrative   . Not on file     Social Determinants of Health     Financial Resource Strain: Not on file   Transportation Needs: Not on file   Social Connections: Not on file   Intimate Partner Violence: Not on file   Housing Stability: Not on file         ROS:  General:  Denies significant weight change, fevers, chills, fatigue.  Eyes:  Denies visual changes.  HENT:  Denies nasal congestion, hearing loss, ear pain, sore throat. + lump on the bottom of lower lip, right side.    Neck:  Denies neck fullness, pain.  Heart:  Denies chest pain, palpitations.  Lungs:  Denies SOB, pleuritic pain, cough.  Gastrointestinal: Denies any abd pain, N/V/D, constipation.  Genitourinary: Denies any dysuria or  hematuria  Musculoskeletal: Denies any joint pain, edema.  Neurologic: Denies headaches, numbness, tingling.  Integumentary : Denies any rashes, lesions, wounds.  Psychiatric:  Denies depression or anxiety.  Otherwise all other review of systems are negative    Objective:    Physical Exam:  Vitals:    06/05/21 1339   BP: 134/78   Pulse: 77   Resp: 18   Temp: 36.1 C (96.9 F)   TempSrc: Temporal   SpO2: 98%   Weight: 99.4 kg (219 lb 3.2 oz)   Height: 1.753 m (5\' 9" )   BMI: 32.44            General:  Well-appearing in no acute distress.  Skin:  Warm, dry.    Eyes:  Pupils are reactive to light.  EOM intact.  Conjunctiva pink.  No scleral icterus.  HENT:  Gross hearing intact.  Ear canals without cerumen impaction.  Tympanic membranes without erythema, effusion.  Tongue midline.  The patient does have a firm nodule located on the inner aspect, right side of the lower lip.  Neck:  Supple.  No JVD, thyromegaly, lymphadenopathy.  No carotid bruits.  Heart:  RRR.  No murmur, heave, lift, thrill.  Heart sounds are normal.  No S3, S4 gallop.  Lungs:  Lungs are clear to auscultation bilaterally.  No wheezes, crackles, rubs.  Abdomen:  Nontender. Bowel sounds are normal.  Musculoskeletal:  Back is nontender to palpation.  No clubbing, cyanosis, edema.  Neuro:  Alert, oriented, cooperative.  Gait is stable.  No focal findings.  Psychiatric:  Affect upbeat.      Assessment and Plan:  Orders Placed This Encounter   . COMPREHENSIVE METABOLIC PNL, FASTING   . LIPID PANEL   .  CBC/DIFF   . Refer to [facility] [employer]. [facility]       1. Oral lesion (Primary)  Patient advised to have blood work completed as ordered.  Additionally, referral placed to ENT for further evaluation.  -     CBC/DIFF; Future  -     Refer to [facility]. [facility]; Future    2. Depression, unspecified depression type  Stable    3. Encounter for medical examination to establish care  Care established today,  patient advised to have blood work completed as ordered.  Will follow-up in 6 months, sooner if needed in the interim.  -     COMPREHENSIVE METABOLIC PNL, FASTING; Future  -     LIPID PANEL; Future  -     CBC/DIFF; Future    4. Lipid screening  Advised have lipid panel completed as ordered.  -     LIPID PANEL; Future    5. BMI 32.0-32.9,adult  Healthy diet activity as tolerated advised.         Follow Up:  Return in 6 months, sooner if needed in the interim.  Patient instructed to call the office with questions or concerns in the interim.    This dictation was transcribed using voice recognition software and may contain minor typographical errors.      [employer], CRNP

## 2021-06-05 NOTE — Nursing Note (Signed)
Est Care;   Patient reports a lump on his lip that feels like a marble in his mouth.   Patient states that he has had this for around 7 months now.

## 2021-06-20 ENCOUNTER — Encounter (INDEPENDENT_AMBULATORY_CARE_PROVIDER_SITE_OTHER): Payer: Self-pay | Admitting: Otolaryngology

## 2021-06-23 ENCOUNTER — Encounter (INDEPENDENT_AMBULATORY_CARE_PROVIDER_SITE_OTHER): Payer: MEDICAID | Admitting: Otolaryngology

## 2021-07-06 ENCOUNTER — Encounter (INDEPENDENT_AMBULATORY_CARE_PROVIDER_SITE_OTHER): Payer: 59 | Admitting: Otolaryngology

## 2021-07-13 ENCOUNTER — Other Ambulatory Visit: Payer: Self-pay

## 2021-07-13 ENCOUNTER — Encounter (HOSPITAL_COMMUNITY): Payer: Self-pay

## 2021-07-13 ENCOUNTER — Emergency Department
Admission: EM | Admit: 2021-07-13 | Discharge: 2021-07-13 | Disposition: A | Discharge status: Home / Self Care | Payer: MEDICAID | Attending: Physician Assistant | Admitting: Physician Assistant

## 2021-07-13 ENCOUNTER — Emergency Department (HOSPITAL_COMMUNITY): Payer: MEDICAID

## 2021-07-13 DIAGNOSIS — S46911A Strain of unspecified muscle, fascia and tendon at shoulder and upper arm level, right arm, initial encounter: Secondary | ICD-10-CM | POA: Insufficient documentation

## 2021-07-13 DIAGNOSIS — W010XXA Fall on same level from slipping, tripping and stumbling without subsequent striking against object, initial encounter: Secondary | ICD-10-CM | POA: Insufficient documentation

## 2021-07-13 DIAGNOSIS — F17210 Nicotine dependence, cigarettes, uncomplicated: Secondary | ICD-10-CM | POA: Insufficient documentation

## 2021-07-13 MED ORDER — IBUPROFEN 800 MG TABLET
800.0000 mg | ORAL_TABLET | Freq: Three times a day (TID) | ORAL | 0 refills | Status: DC | PRN
Start: 2021-07-13 — End: 2022-02-16

## 2021-07-13 NOTE — ED Nurses Note (Signed)
Patient discharged home with family.  AVS reviewed with patient/care giver.  A written copy of the AVS and discharge instructions was given to the patient/care giver. Scripts handed to patient/care giver. Questions sufficiently answered as needed.  Patient/care giver encouraged to follow up with PCP as indicated.  In the event of an emergency, patient/care giver instructed to call 911 or go to the nearest emergency room.

## 2021-07-13 NOTE — ED Provider Notes (Signed)
[facility]           Name: [name]  Age and Gender: 36 y.o. male  PCP: Ilsa Iha, CRNP    Chief Complaint:  Patient presents with     Chief Complaint   Patient presents with   . Shoulder Injury         HPI  [name], date of birth 1985/02/12, is a 36 y.o. male who presents to the Emergency Department with a chief complaint of right shoulder pain.  He states yesterday he was working in his basement when he tripped, tried to catch himself on the wall with his right arm and then fell onto his right side landing on the right shoulder.  Denies head strike or loss of consciousness.  Has shoulder pain today with some intermittent tingling in all 5 digits.  Denies any numbness or weakness.  Is right-hand dominant.        Past Medical History:  Diagnosis     Past Medical History:   Diagnosis Date   . Allergic rhinitis    . Depression    . Essential hypertension    . Fracture of nasal bones    . Headache    . Nosebleed    . Sinusitis    . Viral hepatitis C        Past Surgical History:  Past Surgical History:   Procedure Laterality Date   . Hx hand surgery     . Hx orbital surgery Left 03/31/2020       Family History:   Family History   Problem Relation Age of Onset   . Cancer Mother    . Hypertension (High Blood Pressure) Father    . Cancer Sister    . Anesthesia Complications Neg Hx        Social History     Social History     Tobacco Use   . Smoking status: Every Day     Packs/day: 1.00     Types: Cigarettes     Last attempt to quit: 03/31/2020     Years since quitting: 1.2     Passive exposure: Current   . Smokeless tobacco: Never   Vaping Use   . Vaping Use: Never used   Substance Use Topics   . Alcohol use: No     Comment: denies   . Drug use: Not Currently     Types: Cocaine       Social History     Substance and Sexual Activity   Drug Use Not Currently   . Types: Cocaine       Allergies   Allergen Reactions   . Penicillins        The history listed above was reviewed this  encounter.      PE:   ED Triage Vitals [07/13/21 1056]   BP (Non-Invasive) (!) 151/89   Heart Rate 89   Respiratory Rate 18   Temperature 37 C (98.6 F)   SpO2 99 %   Weight 99.8 kg (220 lb)   Height 1.753 m (5\' 9" )     Physical Exam  Constitutional:       General: He is not in acute distress.  HENT:      Head: Normocephalic and atraumatic.   Eyes:      Conjunctiva/sclera: Conjunctivae normal.   Cardiovascular:      Pulses: Normal pulses.   Pulmonary:      Effort: Pulmonary effort is normal.  Musculoskeletal:         General: Tenderness present. No swelling or deformity.      Comments: SILT, normal grip strength, tenderness is greatest over R AC joint without observed or palpated abnormality.   Skin:     General: Skin is warm and dry.   Neurological:      Mental Status: He is alert.           Orders placed this encounter:  Orders Placed This Encounter   . XR SHOULDER RIGHT   . Ibuprofen (MOTRIN) 800 mg Oral Tablet         Labs:  No results found for this or any previous visit (from the past 12 hour(s)).  Labs reviewed and interpreted by me.    Radiology:    XR SHOULDER RIGHT   Final Result by Edi, Radresults In (07/10 1143)   No acute fracture or dislocation of the right shoulder.         Signed by Milagros Reap, MD          EKG:  No results found for this visit on 07/13/21 (from the past 720 hour(s)).    I have personally reviewed all laboratory and imaging results listed above for this patient.      Medical Decision Making:   XR neg for acute abnormality   Discussed sprain/strain   Provided ibuprofen 800 and contact info [facility]    ED Course as of 07/13/21 1206   Mon Jul 13, 2021   1202 XR SHOULDER RIGHT  IMPRESSION:  No acute fracture or dislocation of the right shoulder.       MDM      Discharge Medication List as of 07/13/2021 12:05 PM      START taking these medications    Details   Ibuprofen (MOTRIN) 800 mg Oral Tablet Take 1 Tablet (800 mg total) by mouth Three times a day as needed for Pain, Disp-30  Tablet, R-0, E-Rx             Clinical Impression:   Clinical Impression   Strain of right shoulder, initial encounter (Primary)       Disposition: Discharged        // Parthenia Ames, PA-C  07/13/2021, 11:19   [facility], [facility] Emergency Department    Parts of this chart were completed in a retrospective fashion due to simultaneous patient care activities in the Emergency Department.

## 2021-07-13 NOTE — ED Triage Notes (Signed)
YESTERDAY, FELL, NO LOC. HAS PAIN IN RIGHT SHOULDER, DIFFICULTY RAISING THIS ARM

## 2021-08-17 ENCOUNTER — Ambulatory Visit: Payer: BC Managed Care – PPO | Admitting: Registered"

## 2021-08-20 ENCOUNTER — Telehealth: Payer: Self-pay | Admitting: Pharmacist

## 2021-08-20 NOTE — Progress Notes (Signed)
[facility])  [facility] Quality Pharmacy Team    08/20/2021  Overton Boggus Jan 04, 1986 [id number]  Reason for referral:  Payer Report-Diabetes Target  Referral source:  BCBS Diabetes Target Report Current insurance: [facility]  PMHx includes but not limited to:  type 2 diabetes, OSA, hypertension, A Fib, and hyperlipidemia  Outreach:  Successful telephone call with patient.  HIPAA identifiers verified.   Subjective:  Patient is a 36 year old male with the above listed medical conditions who was on the BCBS Diabetes Target report due to his HgA1c bieng >13%.  Patient was called to see if there were any barriers to him getting his medications filled as he had not had any insulin filled this year.  He was hospitalized for chest pain in March of this year.    Objective: The ASCVD Risk score (Arnett DK, et al., 2019) failed to calculate for the following reasons:   The 2019 ASCVD risk score is only valid for ages 46 to 68  Lab Results  Component Value Date   CREATININE 0.96 06/02/2021   CREATININE 1.02 03/16/2021   CREATININE 0.95 12/07/2019    Lab Results  Component Value Date   HGBA1C 13.9 Repeated and verified X2. (H) 06/02/2021    Lipid Panel     Component Value Date/Time   CHOL 336 (H) 06/02/2021 1125   CHOL 330 (H) 12/04/2018 1609   TRIG 317.0 (H) 06/02/2021 1125   HDL 48.60 06/02/2021 1125   HDL 42 12/04/2018 1609   CHOLHDL 7 06/02/2021 1125   VLDL 63.4 (H) 06/02/2021 1125   LDLCALC 202 (H) 12/04/2018 1609   LDLDIRECT 207.0 06/02/2021 1125    BP Readings from Last 3 Encounters:  06/02/21 (!) 156/98  03/16/21 (!) 162/103  02/28/20 (!) 164/108    No Known Allergies  Medications Reviewed Today     Reviewed by [name], MD (Physician) on 06/02/21 at 1121  Med List Status: <None>   Medication Order Taking? Sig Documenting Provider Last Dose Status Informant  cloNIDine (CATAPRES) tablet 0.2 mg [id number]   Lyn Records,  MD  Active   diltiazem (CARDIZEM CD) 300 MG 24 hr capsule [id number] Yes TAKE 1 CAPSULE BY MOUTH EVERY DAY Khatri, Hina, PA-C Taking Active   empagliflozin (JARDIANCE) 25 MG TABS tablet [id number] Yes Take 1 tablet (25 mg total) by mouth daily before breakfast. Lyn Records, MD Taking Active Self  glucose blood [facility] VERIO) test strip [id number] Yes Use to test blood sugar up to 4 times a day. Lyn Records, MD Taking Active Self  hydrALAZINE (APRESOLINE) 25 MG tablet [id number]  Take 1 tablet (25 mg total) by mouth 3 (three) times daily. Khatri, Hina, PA-C  Active   Insulin Pen Needle (BD PEN NEEDLE NANO U/F) 32G X 4 MM MISC [id number] Yes Use to inject insulin once daily  Patient taking differently: 18 Units. Use to inject insulin once daily   Lyn Records, MD Taking Active Self  metFORMIN (GLUCOPHAGE) 1000 MG tablet [id number]  Take 1 tablet (1,000 mg total) by mouth 2 (two) times daily with a meal. Khatri, Hina, PA-C  Active   rosuvastatin (CRESTOR) 40 MG tablet [id number]  Take 1 tablet (40 mg total) by mouth daily. Khatri, Hina, PA-C  Active             Assessment:  Patent is not taking his medications as prescribed.  Insulin has not been filled this year.  He reported cost as being  a barrier to him taking his medications.     Patient reported not using insulin in more than 3 months. According to pharmacy fill data, insulin has not been filled this year. Spoke at length with patient about taking better care of himself by making and keeping Doctor appointments, taking his medications as prescribed, and lifestyle modifications.    Patient said cost was the main reason he was not taking his medications. [facility] with the patient on conference call. Metformin has a copay of $23 for a 90 day supply, Toujeo has a copay of $75. for a 1 month supply and Rybelsus has a copay of $24.99 for a one month supply. The patient was given drug manufacturer coupon cards for Rybelsus and Toujeo.  These cards were also faxed to the patient's pharmacy on his behalf to be billed in tandem with his BCBS.   Medication Adherence Findings: Adherence Review  []  Excellent (no doses missed/week)     []  Good (no more than 1 dose missed/week)     []  Partial (2-3 doses missed/week) [x]  Poor (>3 doses missed/week)  Patient with fair understanding of regimen and fair understanding of indications.    Medication Assistance Findings:  Medication assistance needs identified: since patient has commercial insurance he does not qualify for patient assistance programs.  Additional medication assistance options reviewed with patient as warranted:  Coupon and cards for Rybelsus and Toujeo were sent to the patient's pharmacy. Since it has been so long since the patient has actually been on insulin, his provider will be outreached to have someone reach out to the patient about dosing. Patient shared that he never used the 50 units that he was prescribed. He said he only used 10-20 units at a time. He also said he was not using a fast acting insulin prior to meals ( despite FIASP being on his medication list).  Patient was connected with [facility] and wellness to be able to access their pharmacy and sliding scale billing.   Plan: Will route note to patient's PCP.  Will follow up with the patient with in 3-5 business days.  , PharmD, BCACP [facility] Clinical Pharmacist [phone]   +

## 2021-08-31 DIAGNOSIS — A6000 Herpesviral infection of urogenital system, unspecified: Secondary | ICD-10-CM | POA: Diagnosis not present

## 2021-08-31 DIAGNOSIS — Z7251 High risk heterosexual behavior: Secondary | ICD-10-CM | POA: Diagnosis not present

## 2021-09-04 DIAGNOSIS — Z7251 High risk heterosexual behavior: Secondary | ICD-10-CM | POA: Diagnosis not present

## 2021-09-04 DIAGNOSIS — A6000 Herpesviral infection of urogenital system, unspecified: Secondary | ICD-10-CM | POA: Diagnosis not present

## 2021-09-17 ENCOUNTER — Other Ambulatory Visit: Payer: Self-pay

## 2021-09-17 ENCOUNTER — Telehealth: Payer: 59 | Admitting: NURSE PRACTITIONER

## 2021-09-17 DIAGNOSIS — B349 Viral infection, unspecified: Secondary | ICD-10-CM

## 2021-09-17 MED ORDER — FLUTICASONE PROPIONATE 50 MCG/ACTUATION NASAL SPRAY,SUSPENSION
1.0000 | Freq: Every day | NASAL | 0 refills | Status: DC
Start: 2021-09-17 — End: 2022-02-16

## 2021-09-17 MED ORDER — PROMETHAZINE-DM 6.25 MG-15 MG/5 ML ORAL SYRUP
5.0000 mL | ORAL_SOLUTION | Freq: Four times a day (QID) | ORAL | 0 refills | Status: DC | PRN
Start: 2021-09-17 — End: 2022-02-16
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
## 2021-09-22 ENCOUNTER — Ambulatory Visit (INDEPENDENT_AMBULATORY_CARE_PROVIDER_SITE_OTHER): Payer: Self-pay | Admitting: NURSE PRACTITIONER

## 2021-09-22 NOTE — Telephone Encounter (Signed)
Regarding: advise  ----- Message from Jules Husbands sent at 09/22/2021  1:21 PM EDT -----  Ronaldo Miyamoto, CRNP    Pt requesting a letter for his parole officer stating that he did test positive for Covid.  Please advise    Thanks  Jules Husbands

## 2021-09-26 NOTE — Progress Notes (Signed)
New Patient Office Visit  Subjective    Patient ID: Jesse Duncan, male    DOB: 1985-10-03  Age: 36 y.o. MRN: [id number]  CC: No chief complaint on file.   HPI [name] presents to establish care 06/02/21 OV T [name]: [name] has a past medical history of Allergic rhinitis due to pollen (07/06/2010), Atrial fibrillation (HCC) (08/15/2015), Diabetes mellitus without complication (HCC), Dyslipidemia, Hyperlipidemia (05/25/2010), Hypertension, Impaired fasting blood sugar (08/15/2015), Obesity, OSA (obstructive sleep apnea) (10/20/2015), Sleep apnea, Smoker, and Tobacco use disorder (11/14/2013).    He has no past surgical history on file.    His family history includes COPD in his father; Cancer in his maternal grandfather; Healthy in his father; Hypertension in his mother; Stroke in his maternal grandmother.He reports that he has been smoking cigars and e-cigarettes. He has been exposed to tobacco smoke. He has never used smokeless tobacco. He reports that he does not currently use alcohol after a past usage of about 1.0 standard drink per week. He reports that he does not use drugs.   Insulin-requiring or dependent type II diabetes mellitus (HCC)- His A1c is up to 13.9%.  Will start basal/bolus insulin, metformin, and a GLP-1 agonist. -     Basic metabolic panel; Future -     Microalbumin / creatinine urine ratio; Future -     Hemoglobin A1c; Future -     HM Diabetes Foot Exam -     Hemoglobin A1c -     Microalbumin / creatinine urine ratio -     Basic metabolic panel -     insulin glargine, 1 Unit Dial, (TOUJEO SOLOSTAR) 300 UNIT/ML Solostar Pen; Inject 50 Units into the skin daily. -     Discontinue: insulin lispro (HUMALOG KWIKPEN) 200 UNIT/ML KwikPen; Inject 5 Units into the skin with breakfast, with lunch, and with evening meal. -     Continuous Blood Gluc Sensor (DEXCOM G7 SENSOR) MISC; 1 Act by Does not apply route daily. -     Continuous Blood Gluc Receiver (DEXCOM  G7 RECEIVER) DEVI; 1 Act by Does not apply route daily. -     metFORMIN (GLUCOPHAGE-XR) 750 MG 24 hr tablet; Take 2 tablets (1,500 mg total) by mouth daily with breakfast. -     Semaglutide (RYBELSUS) 3 MG TABS; Take 3 mg by mouth daily. -     Amb Referral to Nutrition and Diabetic Education -     Discontinue: insulin aspart (FIASP) 100 UNIT/ML FlexTouch Pen; Inject 5 Units into the skin 3 (three) times daily with meals. -     Insulin Pen Needle (BD PEN NEEDLE NANO U/F) 32G X 4 MM MISC; 1 Act by Subdermal route in the morning, at noon, in the evening, and at bedtime. Use to inject insulin once daily -     insulin aspart (FIASP) 100 UNIT/ML FlexTouch Pen; Inject 5 Units into the skin 3 (three) times daily with meals.   Primary hypertension- Will treat with hydralazine. -     Basic metabolic panel; Future -     CBC with Differential/Platelet; Future -     Aldosterone + renin activity w/ ratio; Future -     TSH; Future -     Urinalysis, Routine w reflex microscopic; Future -     Hepatic function panel; Future -     EKG 12-Lead -     Hepatic function panel -     Urinalysis, Routine w reflex microscopic -     TSH -  Aldosterone + renin activity w/ ratio -     CBC with Differential/Platelet -     Basic metabolic panel -     hydrALAZINE (APRESOLINE) 50 MG tablet; Take 1 tablet (50 mg total) by mouth 3 (three) times daily.   Familial hypercholesteremia -     rosuvastatin (CRESTOR) 40 MG tablet; Take 1 tablet (40 mg total) by mouth daily.   Type 2 diabetes mellitus without complication, without long-term current use of insulin (HCC)   OSA (obstructive sleep apnea)   Other orders -     LDL cholesterol, direct     I have discontinued Drystan Copes's empagliflozin, OneTouch Verio, hydrALAZINE, metFORMIN, and HumaLOG KwikPen. I have also changed his Toujeo SoloStar and BD Pen Needle Nano U/F. Additionally, I am having him start on Dexcom G7 Sensor, Dexcom G7 Receiver, metFORMIN,  Rybelsus, and hydrALAZINE. Lastly, I am having him maintain his diltiazem, rosuvastatin, and insulin aspart. We will stop administering cloNIDine. Outpatient Encounter Medications as of 09/28/2021  Medication Sig  . Continuous Blood Gluc Receiver (DEXCOM G7 RECEIVER) DEVI 1 Act by Does not apply route daily.  . Continuous Blood Gluc Sensor (DEXCOM G7 SENSOR) MISC 1 Act by Does not apply route daily.  Marland Kitchen diltiazem (CARDIZEM CD) 300 MG 24 hr capsule TAKE 1 CAPSULE BY MOUTH EVERY DAY  . hydrALAZINE (APRESOLINE) 50 MG tablet Take 1 tablet (50 mg total) by mouth 3 (three) times daily.  . insulin aspart (FIASP) 100 UNIT/ML FlexTouch Pen Inject 5 Units into the skin 3 (three) times daily with meals.  . insulin glargine, 1 Unit Dial, (TOUJEO SOLOSTAR) 300 UNIT/ML Solostar Pen Inject 50 Units into the skin daily.  . Insulin Pen Needle (BD PEN NEEDLE NANO U/F) 32G X 4 MM MISC 1 Act by Subdermal route in the morning, at noon, in the evening, and at bedtime. Use to inject insulin once daily  . metFORMIN (GLUCOPHAGE-XR) 750 MG 24 hr tablet Take 2 tablets (1,500 mg total) by mouth daily with breakfast.  . rosuvastatin (CRESTOR) 40 MG tablet Take 1 tablet (40 mg total) by mouth daily.  . Semaglutide (RYBELSUS) 3 MG TABS Take 3 mg by mouth daily.   No facility-administered encounter medications on file as of 09/28/2021.    Past Medical History:  Diagnosis Date  . Allergic rhinitis due to pollen 07/06/2010  . Atrial fibrillation ([place]) 08/15/2015  . Diabetes mellitus without complication ([place])   . Dyslipidemia   . Hyperlipidemia 05/25/2010  . Hypertension   . Impaired fasting blood sugar 08/15/2015  . Obesity   . OSA (obstructive sleep apnea) 10/20/2015  . Sleep apnea   . Smoker   . Tobacco use disorder 11/14/2013    No past surgical history on file.  Family History  Problem Relation Age of Onset  . Hypertension Mother   . Healthy Father   . COPD Father   . Stroke Maternal Grandmother   . Cancer  Maternal Grandfather   . Heart disease Neg Hx     Social History   Socioeconomic History  . Marital status: Single    Spouse name: Not on file  . Number of children: Not on file  . Years of education: Not on file  . Highest education level: Not on file  Occupational History  . Not on file  Tobacco Use  . Smoking status: Light Smoker    Types: Cigars, E-cigarettes    Last attempt to quit: 07/14/2015    Years since quitting: 6.2  Passive exposure: Past  . Smokeless tobacco: Never  . Tobacco comments:    sometimes smokes from a hooka pipe  Vaping Use  . Vaping Use: Some days  Substance and Sexual Activity  . Alcohol use: Not Currently    Alcohol/week: 1.0 standard drink of alcohol    Types: 1 Standard drinks or equivalent per week    Comment: social   . Drug use: No  . Sexual activity: Not Currently    Partners: Female  Other Topics Concern  . 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      Social Determinants of Health   Financial Resource Strain: Not on file  Food Insecurity: Not on file  Transportation Needs: Not on file  Physical Activity: Not on file  Stress: Not on file  Social Connections: Not on file  Intimate Partner Violence: Not on file    ROS      Objective    There were no vitals taken for this visit.  Physical Exam  {Labs (Optional):23779}    Assessment & Plan:   Problem List Items Addressed This Visit   None   No follow-ups on file.   Asencion Noble, MD

## 2021-09-28 ENCOUNTER — Encounter: Payer: Self-pay | Admitting: Critical Care Medicine

## 2021-09-28 ENCOUNTER — Ambulatory Visit: Payer: BC Managed Care – PPO | Attending: Critical Care Medicine | Admitting: Critical Care Medicine

## 2021-09-28 VITALS — BP 152/100 | HR 73 | Ht 72.0 in | Wt 259.0 lb

## 2021-09-28 DIAGNOSIS — F17290 Nicotine dependence, other tobacco product, uncomplicated: Secondary | ICD-10-CM

## 2021-09-28 DIAGNOSIS — E7801 Familial hypercholesterolemia: Secondary | ICD-10-CM

## 2021-09-28 DIAGNOSIS — E785 Hyperlipidemia, unspecified: Secondary | ICD-10-CM

## 2021-09-28 DIAGNOSIS — Z794 Long term (current) use of insulin: Secondary | ICD-10-CM

## 2021-09-28 DIAGNOSIS — E1169 Type 2 diabetes mellitus with other specified complication: Secondary | ICD-10-CM | POA: Diagnosis not present

## 2021-09-28 DIAGNOSIS — I5042 Chronic combined systolic (congestive) and diastolic (congestive) heart failure: Secondary | ICD-10-CM

## 2021-09-28 DIAGNOSIS — Z6835 Body mass index (BMI) 35.0-35.9, adult: Secondary | ICD-10-CM

## 2021-09-28 DIAGNOSIS — I10 Essential (primary) hypertension: Secondary | ICD-10-CM

## 2021-09-28 DIAGNOSIS — E1165 Type 2 diabetes mellitus with hyperglycemia: Secondary | ICD-10-CM

## 2021-09-28 DIAGNOSIS — Z720 Tobacco use: Secondary | ICD-10-CM | POA: Insufficient documentation

## 2021-09-28 DIAGNOSIS — I480 Paroxysmal atrial fibrillation: Secondary | ICD-10-CM

## 2021-09-28 DIAGNOSIS — G4733 Obstructive sleep apnea (adult) (pediatric): Secondary | ICD-10-CM

## 2021-09-28 DIAGNOSIS — E119 Type 2 diabetes mellitus without complications: Secondary | ICD-10-CM

## 2021-09-28 LAB — GLUCOSE, POCT (MANUAL RESULT ENTRY): POC Glucose: 255 mg/dl — AB (ref 70–99)

## 2021-09-28 LAB — POCT GLYCOSYLATED HEMOGLOBIN (HGB A1C): HbA1c, POC (controlled diabetic range): 11.6 % — AB (ref 0.0–7.0)

## 2021-09-28 MED ORDER — VALSARTAN-HYDROCHLOROTHIAZIDE 320-25 MG PO TABS: 1.0000 | ORAL_TABLET | Freq: Every day | ORAL | 3 refills | Status: DC

## 2021-09-28 MED ORDER — INSULIN ASPART (W/NIACINAMIDE) 100 UNIT/ML ~~LOC~~ SOPN: 10.0000 [IU] | PEN_INJECTOR | Freq: Three times a day (TID) | SUBCUTANEOUS | 1 refills | Status: DC

## 2021-09-28 MED ORDER — NICOTINE POLACRILEX 4 MG MT LOZG: LOZENGE | OROMUCOSAL | 4 refills | Status: DC

## 2021-09-28 MED ORDER — TOUJEO SOLOSTAR 300 UNIT/ML ~~LOC~~ SOPN: 40.0000 [IU] | PEN_INJECTOR | Freq: Every day | SUBCUTANEOUS | 3 refills | Status: DC

## 2021-09-28 MED ORDER — DILTIAZEM HCL ER COATED BEADS 300 MG PO CP24
ORAL_CAPSULE | ORAL | 1 refills | Status: DC
Start: 2021-09-28 — End: 2023-01-26

## 2021-09-28 MED ORDER — RYBELSUS 7 MG PO TABS: 7.0000 mg | ORAL_TABLET | Freq: Every day | ORAL | 2 refills | Status: DC

## 2021-09-28 MED ORDER — CARVEDILOL 12.5 MG PO TABS: 12.5000 mg | ORAL_TABLET | Freq: Two times a day (BID) | ORAL | 3 refills | Status: DC

## 2021-09-28 MED ORDER — BD PEN NEEDLE NANO U/F 32G X 4 MM MISC: 1.0000 | Freq: Four times a day (QID) | 1 refills | Status: DC

## 2021-09-28 MED ORDER — ONETOUCH ULTRASOFT 2 LANCETS MISC: 1.0000 [IU] | Freq: Three times a day (TID) | 4 refills | Status: DC

## 2021-09-28 MED ORDER — ROSUVASTATIN CALCIUM 40 MG PO TABS: 40.0000 mg | ORAL_TABLET | Freq: Every day | ORAL | 1 refills | Status: DC

## 2021-09-28 MED ORDER — METFORMIN HCL 1000 MG PO TABS: 1000.0000 mg | ORAL_TABLET | Freq: Two times a day (BID) | ORAL | 3 refills | Status: DC

## 2021-09-28 MED ORDER — GLUCOSE BLOOD VI STRP: ORAL_STRIP | 12 refills | Status: DC

## 2021-09-28 NOTE — Assessment & Plan Note (Signed)
On exam appears to be in sinus rhythm plan to refer back to cardiology for reassessment

## 2021-09-28 NOTE — Assessment & Plan Note (Signed)
As per type 2 diabetes assessment

## 2021-09-28 NOTE — Assessment & Plan Note (Signed)
The following Lifestyle Medicine recommendations according to American College of Lifestyle Medicine (ACLM) were discussed and offered to patient who agrees to start the journey:  A. Whole Foods, Plant-based plate comprising of fruits and vegetables, plant-based proteins, whole-grain carbohydrates was discussed in detail with the patient.   A list for source of those nutrients were also provided to the patient.  Patient will use only water or unsweetened tea for hydration. B.  The need to stay away from risky substances including alcohol, smoking; obtaining 7 to 9 hours of restorative sleep, at least 150 minutes of moderate intensity exercise weekly, the importance of healthy social connections,  and stress reduction techniques were discussed. C.  A full color page of  Calorie density of various food groups per pound showing examples of each food groups was provided to the patient.  

## 2021-09-28 NOTE — Assessment & Plan Note (Signed)
Continue with Crestor 40 mg daily recheck lipid panel

## 2021-09-28 NOTE — Assessment & Plan Note (Signed)
History of sleep apnea to a severe degree CPAP set at 16 cm water pressure with a large fullface mask he has not been using it recently I asked him to resume the use of his CPAP machine he said he would comply

## 2021-09-28 NOTE — Assessment & Plan Note (Signed)
  .   Current smoking consumption amount: 5 black and milds daily  . Dicsussion on advise to quit smoking and smoking impacts: Cardiovascular impacts  . Patient's willingness to quit: Wants to quit  . Methods to quit smoking discussed: Use of nicotine lozenge  . Medication management of smoking session drugs discussed: Nicotine lozenge  . Resources provided:  AVS   . Setting quit date not established  . Follow-up arranged 1 month   Time spent counseling the patient: 5 minutes

## 2021-09-28 NOTE — Patient Instructions (Signed)
Stop hydralazine and begin valsartan HCT daily and carvedilol twice daily for blood pressure and stay on diltiazem once daily for blood pressure and heart rate  Increase Rybelsus to 7 mg daily orally  Increase insulin glargine to 40 units daily  Change insulin aspartame to 10 units 3 times a day before meals and eat 3 meals a day  Follow the lifestyle medicine approach as we discussed see below  Stop smoking use nicotine lozenge 3 times daily dissolved in mouth to try to reduce the amount of cigarettes you are taking in  Walk for 30 minutes 5 times a week  Cardiology appointment will be made  You declined the flu vaccine  Get an eye exam we recommend the [facility]  An appointment with Lurena Joiner our clinical pharmacist to be made for your blood pressure and diabetes management in 1 month and see Dr. Joya Gaskins again in 4 months  Refills on your insulin testing supplies sent to your pharmacy         Advice for Weight Management   -For most of [place] the best way to lose weight is by diet management. Generally speaking, diet management means consuming less calories intentionally which over time brings about progressive weight loss.  This can be achieved more effectively by avoiding ultra processed carbohydrates, processed meats, unhealthy fats.    It is critically important to know your numbers: how much calorie you are consuming and how much calorie you need. More importantly, our carbohydrates sources should be unprocessed naturally occurring  complex starch food items.  It is always important to balance nutrition also by  appropriate intake of proteins (mainly plant-based), healthy fats/oils, plenty of fruits and vegetables.    -The American College of Lifestyle Medicine (ACL M) recommends nutrition derived mostly from Whole Food, Plant Predominant Sources example an apple instead of applesauce or apple pie. Eat Plenty of vegetables, Mushrooms, fruits, Legumes, Whole Grains,  Nuts, seeds in lieu of processed meats, processed snacks/pastries red meat, poultry, eggs.  Use only water or unsweetened tea for hydration.  The College also recommends the need to stay away from risky substances including alcohol, smoking; obtaining 7-9 hours of restorative sleep, at least 150 minutes of moderate intensity exercise weekly, importance of healthy social connections, and being mindful of stress and seek help when it is overwhelming.     -Sticking to a routine mealtime to eat 3 meals a day and avoiding unnecessary snacks is shown to have a big role in weight control. Under normal circumstances, the only time we burn stored energy is when we are hungry, so allow  some hunger to take place- hunger means no food between appropriate meal times, only water.  It is not advisable to starve.    -It is better to avoid simple carbohydrates including: Cakes, Sweet Desserts, Ice Cream, Soda (diet and regular), Sweet Tea, Candies, Chips, Cookies, Store Bought Juices, Alcohol in Excess of  1-2 drinks a day, Lemonade,  Artificial Sweeteners, Doughnuts, Coffee Creamers, "Sugar-free" Products, etc, etc.  This is not a complete list...Marland Kitchen.    -Consulting with certified diabetes educators is proven to provide you with the most accurate and current information on diet.  Also, you may be  interested in discussing diet options/exchanges , we can schedule a visit with Jearld Fenton, RDN, CDE for individualized nutrition education.   -Exercise: If you are able: 30 -60 minutes a day ,4 days a week, or 150 minutes of moderate intensity exercise weekly.  The longer the better if tolerated.  Combine stretch, strength, and aerobic activities.  If you were told in the past that you have high risk for cardiovascular diseases, or if you are currently symptomatic, you may seek evaluation by your heart doctor prior to initiating moderate to intense exercise programs.                                    Additional Care  Considerations for Diabetes/Prediabetes     -Diabetes  is a chronic disease.  The most important care consideration is regular follow-up with your diabetes care provider with the goal being avoiding or delaying its complications and to take advantage of advances in medications and technology.  If appropriate actions are taken early enough, type 2 diabetes can even be reversed.  Seek information from the right source.   - Whole Food, Plant Predominant Nutrition is highly recommended: Eat Plenty of vegetables, Mushrooms, fruits, Legumes, Whole Grains, Nuts, seeds in lieu of processed meats, processed snacks/pastries red meat, poultry, eggs as recommended by [employer] of  Lifestyle Medicine (ACLM).   -Type 2 diabetes is known to coexist with other important comorbidities such as high blood pressure and high cholesterol.  It is critical to control not only the diabetes but also the high blood pressure and high cholesterol to minimize and delay the risk of complications including coronary artery disease, stroke, amputations, blindness, etc.  The good news is that this diet recommendation for type 2 diabetes is also very helpful for managing high cholesterol and high blood blood pressure.   - Studies showed that people with diabetes will benefit from a class of medications known as ACE inhibitors and statins.  Unless there are specific reasons not to be on these medications, the standard of care is to consider getting one from these groups of medications at an optimal doses.  These medications are generally considered safe and proven to help protect the heart and the kidneys.     - People with diabetes are encouraged to initiate and maintain regular follow-up with eye doctors, foot doctors, dentists , and if necessary heart and kidney doctors.      - It is highly recommended that people with diabetes quit smoking or stay away from smoking, and get yearly  flu vaccine and pneumonia vaccine at least  every 5 years.  See above for additional recommendations on exercise, sleep, stress management , and healthy social connections.

## 2021-09-28 NOTE — Assessment & Plan Note (Signed)
Hypertension not well controlled patient eating a lot of processed food and no vegetables  The following Lifestyle Medicine recommendations according to [facility]) were discussed and offered to patient who agrees to start the journey:  A. Whole Foods, Plant-based plate comprising of fruits and vegetables, plant-based proteins, whole-grain carbohydrates was discussed in detail with the patient.   A list for source of those nutrients were also provided to the patient.  Patient will use only water or unsweetened tea for hydration. B.  The need to stay away from risky substances including alcohol, smoking; obtaining 7 to 9 hours of restorative sleep, at least 150 minutes of moderate intensity exercise weekly, the importance of healthy social connections,  and stress reduction techniques were discussed. C.  A full color page of  Calorie density of various food groups per pound showing examples of each food groups was provided to the patient.   Plan to begin valsartan HCT and discontinue Hydrea Hazin Plan to continue carvedilol twice daily\ Patient to come back close follow-up with clinical pharmacy on blood pressure control

## 2021-09-28 NOTE — Assessment & Plan Note (Signed)
Patient compensated at this time history of atrial fibrillation is on the diltiazem which may help a little with blood pressure and is why he is not on amlodipine  Plan to refer back to cardiology for reassessment

## 2021-09-28 NOTE — Assessment & Plan Note (Signed)
Type 2 diabetes with use of long-term insulin is on Rybelsus, insulin glargine insulin aspartame  Patient's not compliant with 3 meals a day and is not using insulin aspartame but once daily 15 units and is only taking 10 units daily of insulin glargine and is taking 3 g a day of metformin  Patient is on 3 mg a day of semaglutide tolerating well  Plan is to change insulin glargine to 40 units daily and change insulin aspartame to 10 units 3 times a day before meals and to eat 3 meals a day  Patient will increase rybelsus to 7 mg daily  Plan to have patient see clinical pharmacy short-term follow-up  The following Lifestyle Medicine recommendations according to [place] of Lifestyle Medicine [facility]) were discussed and offered to patient who agrees to start the journey:  A. Whole Foods, Plant-based plate comprising of fruits and vegetables, plant-based proteins, whole-grain carbohydrates was discussed in detail with the patient.   A list for source of those nutrients were also provided to the patient.  Patient will use only water or unsweetened tea for hydration. B.  The need to stay away from risky substances including alcohol, smoking; obtaining 7 to 9 hours of restorative sleep, at least 150 minutes of moderate intensity exercise weekly, the importance of healthy social connections,  and stress reduction techniques were discussed. C.  A full color page of  Calorie density of various food groups per pound showing examples of each food groups was provided to the patient.

## 2021-09-29 ENCOUNTER — Telehealth: Payer: Self-pay

## 2021-09-29 LAB — CBC WITH DIFFERENTIAL/PLATELET
Basophils Absolute: 0 10*3/uL (ref 0.0–0.2)
Basos: 0 %
EOS (ABSOLUTE): 0.1 10*3/uL (ref 0.0–0.4)
Eos: 2 %
Hematocrit: 41.4 % (ref 37.5–51.0)
Hemoglobin: 13 g/dL (ref 13.0–17.7)
Immature Grans (Abs): 0 10*3/uL (ref 0.0–0.1)
Immature Granulocytes: 0 %
Lymphocytes Absolute: 2.6 10*3/uL (ref 0.7–3.1)
Lymphs: 51 %
MCH: 26.1 pg — ABNORMAL LOW (ref 26.6–33.0)
MCHC: 31.4 g/dL — ABNORMAL LOW (ref 31.5–35.7)
MCV: 83 fL (ref 79–97)
Monocytes Absolute: 0.3 10*3/uL (ref 0.1–0.9)
Monocytes: 7 %
Neutrophils Absolute: 2 10*3/uL (ref 1.4–7.0)
Neutrophils: 40 %
Platelets: 292 10*3/uL (ref 150–450)
RBC: 4.99 x10E6/uL (ref 4.14–5.80)
RDW: 12.7 % (ref 11.6–15.4)
WBC: 5 10*3/uL (ref 3.4–10.8)

## 2021-09-29 LAB — COMPREHENSIVE METABOLIC PANEL
ALT: 15 IU/L (ref 0–44)
AST: 13 IU/L (ref 0–40)
Albumin/Globulin Ratio: 1.7 (ref 1.2–2.2)
Albumin: 4.4 g/dL (ref 4.1–5.1)
Alkaline Phosphatase: 90 IU/L (ref 44–121)
BUN/Creatinine Ratio: 10 (ref 9–20)
BUN: 10 mg/dL (ref 6–20)
Bilirubin Total: 0.4 mg/dL (ref 0.0–1.2)
CO2: 25 mmol/L (ref 20–29)
Calcium: 10.1 mg/dL (ref 8.7–10.2)
Chloride: 98 mmol/L (ref 96–106)
Creatinine, Ser: 0.96 mg/dL (ref 0.76–1.27)
Globulin, Total: 2.6 g/dL (ref 1.5–4.5)
Glucose: 169 mg/dL — ABNORMAL HIGH (ref 70–99)
Potassium: 3.7 mmol/L (ref 3.5–5.2)
Sodium: 138 mmol/L (ref 134–144)
Total Protein: 7 g/dL (ref 6.0–8.5)
eGFR: 106 mL/min/{1.73_m2} (ref >59)

## 2021-09-29 LAB — LIPID PANEL
Chol/HDL Ratio: 6.8 ratio — ABNORMAL HIGH (ref 0.0–5.0)
Cholesterol, Total: 298 mg/dL — ABNORMAL HIGH (ref 100–199)
HDL: 44 mg/dL (ref >39)
LDL Chol Calc (NIH): 208 mg/dL — ABNORMAL HIGH (ref 0–99)
Triglycerides: 234 mg/dL — ABNORMAL HIGH (ref 0–149)
VLDL Cholesterol Cal: 46 mg/dL — ABNORMAL HIGH (ref 5–40)

## 2021-09-29 NOTE — Telephone Encounter (Signed)
-----   Message from Elsie Stain, MD sent at 09/29/2021  6:02 AM EDT ----- Let pt know kidney liver normal, cholesterol very high, blood count normal He needs to take the crestor as prescribed

## 2021-09-29 NOTE — Progress Notes (Signed)
Let pt know kidney liver normal, cholesterol very high, blood count normal He needs to take the crestor as prescribed

## 2021-09-29 NOTE — Telephone Encounter (Signed)
Pt was called and vm was left, Information has been sent to nurse pool.   

## 2021-09-30 ENCOUNTER — Telehealth: Payer: Self-pay | Admitting: Pharmacist

## 2021-09-30 NOTE — Progress Notes (Signed)
[facility])                                            [place] Team    09/30/2021  Wandell Scullion [birth year] [id number]  Patient was called to follow up after his provider's visit on 09/28/21.  Coupons were sent to him for Rybelsus, Fiasp, and Toujeo to help with the cost of those medications.  Patient was instructed to take the coupons to his pharmacy and have them bill his insurance and the coupons in tandem.  He was also instructed to give me a call if there were any issues with the billing.    Patient was praised for keeping his provider visit and encouraged to keep up the good work as his A1c dropped from 13.6 to 11.3.  Plan: Follow up with the patient in a few weeks to make sure he was able to pick up his medications.   Elayne Guerin, PharmD, [place] Clinical Pharmacist [phone]

## 2021-10-08 ENCOUNTER — Ambulatory Visit: Payer: BC Managed Care – PPO | Attending: Physician Assistant | Admitting: Physician Assistant

## 2021-10-30 ENCOUNTER — Ambulatory Visit: Payer: BC Managed Care – PPO | Admitting: Pharmacist

## 2021-11-03 ENCOUNTER — Ambulatory Visit (INDEPENDENT_AMBULATORY_CARE_PROVIDER_SITE_OTHER): Payer: Self-pay | Admitting: NURSE PRACTITIONER

## 2021-11-03 NOTE — Telephone Encounter (Signed)
Regarding: med request  ----- Message from Jules Husbands sent at 11/03/2021  1:46 PM EDT -----  Ronaldo Miyamoto, CRNP    Pt requesting a medication to help with  stomach flu      [id number] - [place] [address]    [place] [address]    Phone: [phone] Fax: [phone]    Hours: Not open 24 hours         Thanks  Jules Husbands

## 2021-11-03 NOTE — Telephone Encounter (Signed)
Needs appt. Left vm. May schedule with call center.

## 2021-11-10 ENCOUNTER — Encounter (INDEPENDENT_AMBULATORY_CARE_PROVIDER_SITE_OTHER): Payer: MEDICAID | Admitting: NURSE PRACTITIONER

## 2021-11-10 ENCOUNTER — Encounter (INDEPENDENT_AMBULATORY_CARE_PROVIDER_SITE_OTHER): Payer: Self-pay | Admitting: NURSE PRACTITIONER

## 2021-12-04 ENCOUNTER — Encounter (INDEPENDENT_AMBULATORY_CARE_PROVIDER_SITE_OTHER): Payer: MEDICAID | Admitting: Physician Assistant

## 2022-01-01 ENCOUNTER — Ambulatory Visit (HOSPITAL_COMMUNITY): Payer: Self-pay

## 2022-01-01 NOTE — Telephone Encounter (Addendum)
Pt added to psych waiting list.    [name]. [name], CASE MANAGER      Regarding: NPV Request  ----- Message from Wyvonne Lenz sent at 01/01/2022  8:39 AM EST -----  Pt called requesting to schedule NPV for psychiatry.   Pt states he has [facility] but would not process.  Pt also states he has a new employer insurance that will be starting soon and will call back to process new insurance information.  Pt is aware of the process. Thank you!

## 2022-01-28 ENCOUNTER — Ambulatory Visit: Payer: BC Managed Care – PPO | Admitting: Critical Care Medicine

## 2022-01-28 NOTE — Progress Notes (Deleted)
New Patient Office Visit  Subjective    Patient ID: Jesse Duncan, male    DOB: [birth year]  Age: 37 y.o. MRN: [id number]  CC:  No chief complaint on file.   HPI 09/28/21 Jesse Duncan presents to establish care This is a pleasant 37 year old male who is establishing care in our clinic now.  He was last seen in May of this year by Dr. Scarlette Calico of [place] primary care.  Patient has obesity diabetes hypertension and smoking use.  On arrival today blood sugars 255 blood pressure 152/100 and his hemoglobin A1c was 11.6 noted it was 13.9 in May of this year. Below is documentation from the May visit 06/02/21 OV Jesse Duncan: Jesse Duncan has a past medical history of Allergic rhinitis due to pollen (07/06/2010), Atrial fibrillation ([place]) (08/15/2015), Diabetes mellitus without complication ([place]), Dyslipidemia, Hyperlipidemia (05/25/2010), Hypertension, Impaired fasting blood sugar (08/15/2015), Obesity, OSA (obstructive sleep apnea) (10/20/2015), Sleep apnea, Smoker, and Tobacco use disorder (11/14/2013).    He has no past surgical history on file.    His family history includes COPD in his father; Cancer in his maternal grandfather; Healthy in his father; Hypertension in his mother; Stroke in his maternal grandmother.He reports that he has been smoking cigars and e-cigarettes. He has been exposed to tobacco smoke. He has never used smokeless tobacco. He reports that he does not currently use alcohol after a past usage of about 1.0 standard drink per week. He reports that he does not use drugs.   Insulin-requiring or dependent type II diabetes mellitus ([place])- His A1c is up to 13.9%.  Will start basal/bolus insulin, metformin, and a GLP-1 agonist. -     Basic metabolic panel; Future -     Microalbumin / creatinine urine ratio; Future -     Hemoglobin A1c; Future -     HM Diabetes Foot Exam -     Hemoglobin A1c -     Microalbumin / creatinine urine ratio -     Basic metabolic panel -      insulin glargine, 1 Unit Dial, (TOUJEO SOLOSTAR) 300 UNIT/ML Solostar Pen; Inject 50 Units into the skin daily. -     Discontinue: insulin lispro (HUMALOG KWIKPEN) 200 UNIT/ML KwikPen; Inject 5 Units into the skin with breakfast, with lunch, and with evening meal. -     Continuous Blood Gluc Sensor (DEXCOM G7 SENSOR) MISC; 1 Act by Does not apply route daily. -     Continuous Blood Gluc Receiver ([place]) [place]; 1 Act by Does not apply route daily. -     metFORMIN (GLUCOPHAGE-XR) 750 MG 24 hr tablet; Take 2 tablets (1,500 mg total) by mouth daily with breakfast. -     Semaglutide (RYBELSUS) 3 MG TABS; Take 3 mg by mouth daily. -     Amb Referral to Nutrition and Diabetic Education -     Discontinue: insulin aspart (FIASP) 100 UNIT/ML FlexTouch Pen; Inject 5 Units into the skin 3 (three) times daily with meals. -     Insulin Pen Needle (BD PEN NEEDLE NANO U/F) 32G X 4 MM MISC; 1 Act by Subdermal route in the morning, at noon, in the evening, and at bedtime. Use to inject insulin once daily -     insulin aspart (FIASP) 100 UNIT/ML FlexTouch Pen; Inject 5 Units into the skin 3 (three) times daily with meals.   Primary hypertension- Will treat with hydralazine. -     Basic metabolic panel; Future -     CBC  with Differential/Platelet; Future -     Aldosterone + renin activity w/ ratio; Future -     TSH; Future -     Urinalysis, Routine w reflex microscopic; Future -     Hepatic function panel; Future -     EKG 12-Lead -     Hepatic function panel -     Urinalysis, Routine w reflex microscopic -     TSH -     Aldosterone + renin activity w/ ratio -     CBC with Differential/Platelet -     Basic metabolic panel -     hydrALAZINE (APRESOLINE) 50 MG tablet; Take 1 tablet (50 mg total) by mouth 3 (three) times daily.   Familial hypercholesteremia -     rosuvastatin (CRESTOR) 40 MG tablet; Take 1 tablet (40 mg total) by mouth daily.   Type 2 diabetes mellitus without complication,  without long-term current use of insulin (HCC)   OSA (obstructive sleep apnea)   Other orders -     LDL cholesterol, direct     I have discontinued Jesse Duncan's empagliflozin, OneTouch Verio, hydrALAZINE, metFORMIN, and HumaLOG KwikPen. I have also changed his Toujeo SoloStar and BD Pen Needle Nano U/F. Additionally, I am having him start on Dexcom G7 Sensor, Dexcom G7 Receiver, metFORMIN, Rybelsus, and hydrALAZINE. Lastly, I am having him maintain his diltiazem, rosuvastatin, and insulin aspart. We will stop administering cloNIDine.  Note this patient never picked up the [place] sensor still has One Touch Verio testing at home he states at home his blood sugars anywhere from 180 to 300. Patient declines to receive the flu vaccine.  He smokes 5 black and milds daily.  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  He has no other real complaints.  01/28/22  Outpatient Encounter Medications as of 01/28/2022  Medication Sig   carvedilol (COREG) 12.5 MG tablet Take 1 tablet (12.5 mg total) by mouth 2 (two) times daily with a meal.   diltiazem (CARDIZEM CD) 300 MG 24 hr capsule TAKE 1 CAPSULE BY MOUTH EVERY DAY   glucose blood test strip Use as instructed   insulin aspart (FIASP) 100 UNIT/ML FlexTouch Pen Inject 10 Units into the skin 3 (three) times daily with meals.   insulin glargine, 1 Unit Dial, (TOUJEO SOLOSTAR) 300 UNIT/ML Solostar Pen Inject 40 Units into the skin daily.   Insulin Pen Needle (BD PEN NEEDLE NANO U/F) 32G X 4 MM MISC 1 Act by Subdermal route in the morning, at noon, in the evening, and at bedtime. Use to inject insulin once daily   metFORMIN (GLUCOPHAGE) 1000 MG tablet Take 1 tablet (1,000 mg total) by mouth 2 (two) times daily with a meal.   nicotine polacrilex (NICORETTE MINI) 4 MG lozenge Use '4mg'$  three times a day to stop smoking   OneTouch UltraSoft 2 Lancets MISC 1 Units by Does not apply route 3 (three) times daily. Before meals   rosuvastatin (CRESTOR) 40 MG tablet Take 1  tablet (40 mg total) by mouth daily.   Semaglutide (RYBELSUS) 7 MG TABS Take 7 mg by mouth daily.   valsartan-hydrochlorothiazide (DIOVAN-HCT) 320-25 MG tablet Take 1 tablet by mouth daily.   No facility-administered encounter medications on file as of 01/28/2022.    Past Medical History:  Diagnosis Date   Allergic rhinitis due to pollen 07/06/2010   Atrial fibrillation ([place]) 08/15/2015   Diabetes mellitus without complication ([place])    Dyslipidemia    Hyperlipidemia 05/25/2010   Hypertension    Impaired  fasting blood sugar 08/15/2015   Obesity    OSA (obstructive sleep apnea) 10/20/2015   Sleep apnea    Smoker    Tobacco use disorder 11/14/2013    No past surgical history on file.  Family History  Problem Relation Age of Onset   Hypertension Mother    Healthy Father    COPD Father    Stroke Maternal Grandmother    Cancer Maternal Grandfather    Heart disease Neg Hx     Social History   Socioeconomic History   Marital status: Single    Spouse name: Not on file   Number of children: Not on file   Years of education: Not on file   Highest education level: Not on file  Occupational History   Not on file  Tobacco Use   Smoking status: Light Smoker    Types: Cigars, E-cigarettes    Last attempt to quit: 07/14/2015    Years since quitting: 6.5    Passive exposure: Past   Smokeless tobacco: Never   Tobacco comments:    sometimes smokes from a hooka pipe  Vaping Use   Vaping Use: Former  Substance and Sexual Activity   Alcohol use: Yes    Alcohol/week: 1.0 standard drink of alcohol    Types: 1 Standard drinks or equivalent per week    Comment: social    Drug use: No   Sexual activity: Yes    Partners: Female  Other Topics Concern   Not on file  Social History Narrative   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      Social Determinants of Health   Financial Resource Strain: Not on file  Food Insecurity: Not on file  Transportation Needs: Not on  file  Physical Activity: Not on file  Stress: Not on file  Social Connections: Not on file  Intimate Partner Violence: Not on file    Review of Systems  Constitutional:  Negative for chills, diaphoresis, fever, malaise/fatigue and weight loss.  HENT:  Negative for congestion, hearing loss, nosebleeds, sore throat and tinnitus.   Eyes:  Negative for blurred vision, photophobia and redness.  Respiratory:  Negative for cough, hemoptysis, sputum production, shortness of breath, wheezing and stridor.   Cardiovascular:  Negative for chest pain, palpitations, orthopnea, claudication, leg swelling and PND.  Gastrointestinal:  Negative for abdominal pain, blood in stool, constipation, diarrhea, heartburn, nausea and vomiting.  Genitourinary:  Negative for dysuria, flank pain, frequency, hematuria and urgency.  Musculoskeletal:  Negative for back pain, falls, joint pain, myalgias and neck pain.  Skin:  Negative for itching and rash.  Neurological:  Negative for dizziness, tingling, tremors, sensory change, speech change, focal weakness, seizures, loss of consciousness, weakness and headaches.  Endo/Heme/Allergies:  Negative for environmental allergies and polydipsia. Does not bruise/bleed easily.  Psychiatric/Behavioral:  Negative for depression, memory loss, substance abuse and suicidal ideas. The patient is not nervous/anxious and does not have insomnia.         Objective    There were no vitals taken for this visit.  Physical Exam Vitals reviewed.  Constitutional:      Appearance: Normal appearance. He is well-developed. He is obese. He is not diaphoretic.  HENT:     Head: Normocephalic and atraumatic.     Nose: No nasal deformity, septal deviation, mucosal edema or rhinorrhea.     Right Sinus: No maxillary sinus tenderness or frontal sinus tenderness.     Left Sinus: No maxillary sinus tenderness  or frontal sinus tenderness.     Mouth/Throat:     Pharynx: No oropharyngeal exudate.   Eyes:     General: No scleral icterus.    Conjunctiva/sclera: Conjunctivae normal.     Pupils: Pupils are equal, round, and reactive to light.  Neck:     Thyroid: No thyromegaly.     Vascular: No carotid bruit or JVD.     Trachea: Trachea normal. No tracheal tenderness or tracheal deviation.  Cardiovascular:     Rate and Rhythm: Normal rate and regular rhythm.     Chest Wall: PMI is not displaced.     Pulses: Normal pulses. No decreased pulses.     Heart sounds: Normal heart sounds, S1 normal and S2 normal. Heart sounds not distant. No murmur heard.    No systolic murmur is present.     No diastolic murmur is present.     No friction rub. No gallop. No S3 or S4 sounds.  Pulmonary:     Effort: No tachypnea, accessory muscle usage or respiratory distress.     Breath sounds: No stridor. No decreased breath sounds, wheezing, rhonchi or rales.  Chest:     Chest wall: No tenderness.  Abdominal:     General: Bowel sounds are normal. There is no distension.     Palpations: Abdomen is soft. Abdomen is not rigid.     Tenderness: There is no abdominal tenderness. There is no guarding or rebound.  Musculoskeletal:        General: Normal range of motion.     Cervical back: Normal range of motion and neck supple. No edema, erythema or rigidity. No muscular tenderness. Normal range of motion.  Lymphadenopathy:     Head:     Right side of head: No submental or submandibular adenopathy.     Left side of head: No submental or submandibular adenopathy.     Cervical: No cervical adenopathy.  Skin:    General: Skin is warm and dry.     Coloration: Skin is not pale.     Findings: No rash.     Nails: There is no clubbing.  Neurological:     Mental Status: He is alert and oriented to person, place, and time.     Sensory: No sensory deficit.  Psychiatric:        Speech: Speech normal.        Behavior: Behavior normal.   38 minutes spent on this coun      Assessment & Plan:   Problem List  Items Addressed This Visit   None 38 minutes we will encounter extra time needed for patient education complex decision making multisystem assessments Patient advised to [facility] that will take his [employer] No follow-ups on file.   Asencion Noble, MD

## 2022-02-02 LAB — HM DIABETES EYE EXAM

## 2022-02-16 ENCOUNTER — Other Ambulatory Visit: Payer: Self-pay

## 2022-02-16 ENCOUNTER — Encounter (HOSPITAL_COMMUNITY): Payer: Self-pay

## 2022-02-16 ENCOUNTER — Emergency Department
Admission: EM | Admit: 2022-02-16 | Discharge: 2022-02-17 | Discharge status: Against Medical Advice | Payer: MEDICAID | Attending: Emergency Medicine | Admitting: Emergency Medicine

## 2022-02-16 ENCOUNTER — Emergency Department (HOSPITAL_COMMUNITY): Payer: MEDICAID

## 2022-02-16 ENCOUNTER — Emergency Department (HOSPITAL_COMMUNITY): Payer: Self-pay

## 2022-02-16 DIAGNOSIS — T588X1A Toxic effect of carbon monoxide from other source, accidental (unintentional), initial encounter: Secondary | ICD-10-CM | POA: Insufficient documentation

## 2022-02-16 DIAGNOSIS — Z5329 Procedure and treatment not carried out because of patient's decision for other reasons: Secondary | ICD-10-CM | POA: Insufficient documentation

## 2022-02-16 DIAGNOSIS — T5891XA Toxic effect of carbon monoxide from unspecified source, accidental (unintentional), initial encounter: Secondary | ICD-10-CM

## 2022-02-16 DIAGNOSIS — R001 Bradycardia, unspecified: Secondary | ICD-10-CM | POA: Insufficient documentation

## 2022-02-16 DIAGNOSIS — Z1152 Encounter for screening for COVID-19: Secondary | ICD-10-CM | POA: Insufficient documentation

## 2022-02-16 LAB — COMPREHENSIVE METABOLIC PANEL, NON-FASTING
ALBUMIN: 3.7 g/dL (ref 3.5–5.0)
ALKALINE PHOSPHATASE: 104 U/L (ref 45–115)
ALT (SGPT): 32 U/L (ref 10–55)
ANION GAP: 12 mmol/L (ref 4–13)
AST (SGOT): 27 U/L (ref 8–45)
BILIRUBIN TOTAL: 0.3 mg/dL (ref 0.3–1.3)
BUN/CREA RATIO: 17 (ref 6–22)
BUN: 15 mg/dL (ref 8–25)
CALCIUM: 9.5 mg/dL (ref 8.6–10.2)
CHLORIDE: 106 mmol/L (ref 96–111)
CO2 TOTAL: 21 mmol/L — ABNORMAL LOW (ref 22–30)
CREATININE: 0.87 mg/dL (ref 0.75–1.35)
ESTIMATED GFR - MALE: >90 mL/min/BSA (ref >=60)
GLUCOSE: 128 mg/dL — ABNORMAL HIGH (ref 65–125)
POTASSIUM: 3.4 mmol/L — ABNORMAL LOW (ref 3.5–5.1)
PROTEIN TOTAL: 6.8 g/dL (ref 6.4–8.3)
SODIUM: 139 mmol/L (ref 136–145)

## 2022-02-16 LAB — COVID-19 ~~LOC~~ MOLECULAR LAB TESTING
INFLUENZA VIRUS TYPE A: NOT DETECTED
INFLUENZA VIRUS TYPE B: NOT DETECTED
SARS-COV-2: NOT DETECTED

## 2022-02-16 LAB — ECG 12 LEAD
Atrial Rate: 58 {beats}/min
Calculated P Axis: 66 degrees
Calculated R Axis: 68 degrees
Calculated T Axis: 64 degrees
PR Interval: 142 ms
QRS Duration: 108 ms
QT Interval: 468 ms
QTC Calculation: 459 ms
Ventricular rate: 58 {beats}/min

## 2022-02-16 LAB — CBC WITH DIFF
BASOPHIL #: <0.1 10*3/uL (ref <=0.20)
BASOPHIL %: 0 %
EOSINOPHIL #: 0.14 10*3/uL (ref <=0.50)
EOSINOPHIL %: 2 %
HCT: 43 % (ref 38.9–52.0)
HGB: 14.6 g/dL (ref 13.4–17.5)
IMMATURE GRANULOCYTE #: <0.1 10*3/uL (ref <0.10)
IMMATURE GRANULOCYTE %: 0 % (ref 0.0–1.0)
LYMPHOCYTE #: 2.61 10*3/uL (ref 1.00–4.80)
LYMPHOCYTE %: 29 %
MCH: 29.4 pg (ref 26.0–32.0)
MCHC: 34 g/dL (ref 31.0–35.5)
MCV: 86.5 fL (ref 78.0–100.0)
MONOCYTE #: 0.53 10*3/uL (ref 0.20–1.10)
MONOCYTE %: 6 %
MPV: 10.2 fL (ref 8.7–12.5)
NEUTROPHIL #: 5.73 10*3/uL (ref 1.50–7.70)
NEUTROPHIL %: 63 %
PLATELETS: 246 10*3/uL (ref 150–400)
RBC: 4.97 10*6/uL (ref 4.50–6.10)
RDW-CV: 12 % (ref 11.5–15.5)
WBC: 9.1 10*3/uL (ref 3.7–11.0)

## 2022-02-16 LAB — ARTERIAL BLOOD GAS/CO-OX
BASE EXCESS (ARTERIAL): 1.8 mmol/L (ref 0.0–2.0)
BICARBONATE (ARTERIAL): 23.6 mmol/L (ref 21.0–29.0)
CARBOXYHEMOGLOBIN: 4 % — ABNORMAL HIGH (ref 0.0–1.5)
MET-HEMOGLOBIN: 0.2 % (ref 0.0–1.5)
OXYHEMOGLOBIN: 91.1 % — ABNORMAL LOW (ref 95.0–99.0)
PCO2 (ARTERIAL): 30 mm/Hg — ABNORMAL LOW (ref 32–46)
PH (ARTERIAL): 7.52 (ref 7.38–7.46)
PO2 (ARTERIAL): 68 mm/Hg — ABNORMAL LOW (ref 74–108)

## 2022-02-16 LAB — MAGNESIUM: MAGNESIUM: 1.9 mg/dL (ref 1.8–2.6)

## 2022-02-16 LAB — TROPONIN-I: TROPONIN-I HS: <2.7 ng/L (ref <=35.0)

## 2022-02-16 LAB — LACTIC ACID LEVEL W/ REFLEX FOR LEVEL >2.0: LACTIC ACID: 3.7 mmol/L — ABNORMAL HIGH (ref 0.5–2.2)

## 2022-02-16 MED ORDER — SODIUM CHLORIDE 0.9 % IV BOLUS
1000.0000 mL | INJECTION | Status: DC
Start: 2022-02-16 — End: 2022-02-16

## 2022-02-16 MED ORDER — SODIUM CHLORIDE 0.9 % IV BOLUS
2000.0000 mL | INJECTION | Status: AC
Start: 2022-02-16 — End: 2022-02-16
  Administered 2022-02-16: 0 mL via INTRAVENOUS
  Administered 2022-02-16: 2000 mL via INTRAVENOUS

## 2022-02-16 NOTE — ED Provider Notes (Signed)
[facility] Emergency Department        Chief Complaint:  Patient presents with     Chief Complaint   Patient presents with    Shortness of Breath       HPI:   [name], date of birth August 08, 1985, is a 37 y.o. male who presents to the Emergency Department via Car accompanied by his father with a chief complaint of shortness of breath, dizziness, and vomiting.  History is mostly provided by the patient's father.  He states the patient has been renovating an old house and has been using a kerosene heater and a gasoline powered generator.  Just prior to presentation, he received a call from his son stating that he was not feeling well.  He went to pick him up and found him drowsy.  He notes the patient vomited multiple times on the way to the emergency department.  On presentation, he reports a headache, dizziness, shortness of breath, and nausea.  No chest pain.  No fall or other trauma.  He states he is feeling a little better upon arrival.        History:   The following were reviewed: Medical History  Surgical History  Family History  Social History      Vital Signs:  Pre-disposition vitals:  ED Triage Vitals [02/16/22 2048]   BP (Non-Invasive) 132/71   Heart Rate 100   Respiratory Rate (!) 28   Temperature (!) 34.4 C (93.9 F)   SpO2 100 %   Weight 99.8 kg (220 lb)   Height 1.753 m (5' 9"$ )         PE:   Nursing notes and vital signs reviewed.  Constitutional: 37 y.o. male appears stated age, ill-appearing normal color.   HEENT:    Head:  Normocephalic and atraumatic.    Eyes:  EOMI, PERRL    Mouth/Throat:  Mucous membranes moist.    Neck:  Trachea midline. Neck supple.   Cardiovascular:  RRR, no murmur appreciated. Intact distal pulses.  Pulmonary/Chest:   No respiratory distress. Breath sounds clear and equal bilaterally. No wheezes or rales. No chest wall tenderness to palpation.   Abdominal:  BS +. Abdomen soft, no tenderness, rebound or guarding.          Musculoskeletal:  No edema,  tenderness or deformity noted.  Skin:  Warm and dry.   Psychiatric:  Appropriate mood and affect for situation. Behavior is normal.   Neurological:  Patient keenly alert and responsive. Oriented x3. Facies symmetric. SILT to forehead, maxillary region, and at jawline.  Pupils equal and reactive, EOMI, no nystagmus in either direction. No dysarthria or aphasia. Motor function of all extremities intact without drift. Strength 5/5 in all extremities. Normal finger to nose bilaterally. SILT and equal over all extremities.        Orders:  Orders Placed This Encounter    XR AP MOBILE CHEST    ARTERIAL BLOOD GAS/CO-OX    COVID - 19 SCREENING - Symptomatic - PUI    CBC/DIFF    COMPREHENSIVE METABOLIC PANEL, NON-FASTING    MAGNESIUM    LACTIC ACID LEVEL W/ REFLEX FOR LEVEL >2.0    TROPONIN-I    CBC WITH DIFF    LACTIC ACID - FIRST REFLEX    ARTERIAL BLOOD GAS/CO-OX    ECG 12 LEAD    NS bolus infusion 2,000 mL         ED Course/Medical Decision Making:  ED Course as of 02/17/22 0100  Tue Feb 16, 2022   2138 Temperature(!): 34.4 C (93.9 F)   2138 BP (Non-Invasive): 132/71   2138 Heart Rate: 100   2138 Respiratory Rate(!): 28   2138 SpO2: 100 %   2138 PH(!!): 7.52   2138 PCO2(!): 30   2138 PO2(!): 68   2138 BICARBONATE: 23.6   2138 CARBOXYHEMOGLOBIN(!): 4.0   2200 LACTIC ACID(!): 3.7   2200 SARS CORONAVIRUS 2 (SARS-CoV-2): Not Detected   2201 INFLUENZA VIRUS TYPE A: Not Detected   2201 INFLUENZA VIRUS TYPE B: Not Detected   2252 XR AP MOBILE CHEST  No acute thoracic findings   Wed Feb 17, 2022   0040 LACTIC ACID: 1.3     [name] is a 37 y.o. male who presents for evaluation of headache, dizziness, shortness of breath, nausea, and vomiting.  Ddx includes, but is not limited to:  Carbon monoxide poisoning versus viral illness versus hypoglycemia  Appropriate labs/imaging ordered and reviewed/interpreted by myself during ED stay.  Most Recent EKG This Encounter   ECG 12 LEAD    Collection Time: 02/16/22  9:06 PM    Result Value    Ventricular rate 58    Atrial Rate 58    PR Interval 142    QRS Duration 108    QT Interval 468    QTC Calculation 459    Calculated P Axis 66    Calculated R Axis 68    Calculated T Axis 64    Narrative    Sinus bradycardia  Early repolarization  Otherwise normal ECG  No previous ECGs available  Confirmed by Gwendolyn Lima [phone]) on 02/16/2022 10:01:13 PM     ABG shows a respiratory alkalosis. O2HB is low.  Carboxyhemoglobin is noted to be elevated at 4.0.  Lactate is also elevated.  Placed on 15 liters non-rebreather.  Lactate improved with IV fluids  On reassessment, he reports feeling better.  Respiratory called for repeat ABG, but unsuccessful after three attempts.  Patient was agreeable to let me stick him for an ABG, however, on my second attempt and before a sample was successfully obtained, he stated that he did not want any further attempts at an ABG.  We discussed my concerns about carbon monoxide poisoning and I explained why a repeat ABG is important.  He states he feels better and wants to leave.  He was agreeable to have a VBG drawn from his IV and this showed improvement in his pH and pCO2.  Risk of worsening illness, long term disability, or even death as a result of declining repeat ABG such was discussed with and understood by patient.  Patient demonstrates adequate comprehension and decision making capacity at this time.  Patient stated understanding of why, as the treating physician, I have recommended further workup and treatment and the possible adverse outcomes of declining such at this time.  All questions were answered.  The patient is encouraged to return to the Emergency Department at any time, and for any reason, to be re-evaluated.      Medications Administered in the ED   NS bolus infusion 2,000 mL (0 mL Intravenous Stopped 02/16/22 2335)     Medical Decision Making  Problems Addressed:  Accidental poisoning by carbon monoxide, initial encounter: acute illness or  injury that poses a threat to life or bodily functions    Amount and/or Complexity of Data Reviewed  Independent Historian: parent  Labs: ordered. Decision-making details documented in ED Course.  Radiology: ordered and independent  interpretation performed. Decision-making details documented in ED Course.  ECG/medicine tests: ordered and independent interpretation performed. Decision-making details documented in ED Course.          Procedures:    None        Clinical Impression   Accidental poisoning by carbon monoxide, initial encounter (Primary)         Disposition: AMA            This chart may have been completed after the conclusion of this patient's care due to the time constraints of simultaneous responsiltibites of direct patient care activities during the clinical shift in the emergency department.     This note was partially generated using MModal Fluency Direct system, and there may be some incorrect words, spellings, and punctuation that were not noted in checking the note before saving.     Gwendolyn Lima, MD 02/17/2022, 02:35    Department of Emergency Medicine   [facility]

## 2022-02-16 NOTE — ED Triage Notes (Addendum)
Pt states all day at home was around a kerosene heater, thinks he has carbon monoxide poisoning, feels sob, dizzy, had pain in both hands.

## 2022-02-17 LAB — VENOUS BLOOD GAS
BASE DEFICIT: 0.7 mmol/L (ref 0.0–2.0)
BICARBONATE (VENOUS): 22.7 mmol/L (ref 22.5–33.0)
PCO2 (VENOUS): 34 mm/Hg — ABNORMAL LOW (ref 35–54)
PH (VENOUS): 7.44 — ABNORMAL HIGH (ref 7.34–7.42)
PO2 (VENOUS): 52 mm/Hg

## 2022-02-17 LAB — LACTIC ACID - FIRST REFLEX: LACTIC ACID: 1.3 mmol/L (ref 0.5–2.2)

## 2022-02-17 NOTE — ED Nurses Note (Signed)
Pt wanted to leave without further attempts at ABG's, will sign out AMA. Risks explained to pt up to and including death to which he verbalized understanding. AMA paperwork signed and patient discharged without incident.

## 2022-02-17 NOTE — ED Nurses Note (Signed)
Patient discharged home with family.  AVS reviewed with patient/care giver.  A written copy of the AVS and discharge instructions was given to the patient/care giver. Scripts handed to patient/care giver. Questions sufficiently answered as needed.  Patient/care giver encouraged to follow up with PCP as indicated.  In the event of an emergency, patient/care giver instructed to call 911 or go to the nearest emergency room.

## 2022-02-24 ENCOUNTER — Other Ambulatory Visit: Payer: Self-pay

## 2022-02-24 ENCOUNTER — Encounter (HOSPITAL_COMMUNITY): Payer: Self-pay

## 2022-02-24 ENCOUNTER — Emergency Department
Admission: EM | Admit: 2022-02-24 | Discharge: 2022-02-24 | Disposition: A | Discharge status: Home / Self Care | Payer: MEDICAID | Attending: Emergency Medicine | Admitting: Emergency Medicine

## 2022-02-24 ENCOUNTER — Emergency Department (HOSPITAL_COMMUNITY): Payer: BC Managed Care – PPO

## 2022-02-24 ENCOUNTER — Emergency Department (HOSPITAL_COMMUNITY): Payer: MEDICAID

## 2022-02-24 DIAGNOSIS — B998 Other infectious disease: Secondary | ICD-10-CM | POA: Insufficient documentation

## 2022-02-24 DIAGNOSIS — L089 Local infection of the skin and subcutaneous tissue, unspecified: Secondary | ICD-10-CM

## 2022-02-24 LAB — C-REACTIVE PROTEIN(CRP),INFLAMMATION: CRP INFLAMMATION: 2.4 mg/L (ref <8.0)

## 2022-02-24 LAB — BASIC METABOLIC PANEL
ANION GAP: 6 mmol/L (ref 4–13)
BUN/CREA RATIO: 16 (ref 6–22)
BUN: 14 mg/dL (ref 8–25)
CALCIUM: 9 mg/dL (ref 8.6–10.2)
CHLORIDE: 108 mmol/L (ref 96–111)
CO2 TOTAL: 25 mmol/L (ref 22–30)
CREATININE: 0.86 mg/dL (ref 0.75–1.35)
ESTIMATED GFR - MALE: >90 mL/min/BSA (ref >=60)
GLUCOSE: 100 mg/dL (ref 65–125)
POTASSIUM: 3.9 mmol/L (ref 3.5–5.1)
SODIUM: 139 mmol/L (ref 136–145)

## 2022-02-24 LAB — CBC WITH DIFF
BASOPHIL #: <0.1 10*3/uL (ref <=0.20)
BASOPHIL %: 0 %
EOSINOPHIL #: 0.11 10*3/uL (ref <=0.50)
EOSINOPHIL %: 2 %
HCT: 41.7 % (ref 38.9–52.0)
HGB: 14.1 g/dL (ref 13.4–17.5)
IMMATURE GRANULOCYTE #: <0.1 10*3/uL (ref <0.10)
IMMATURE GRANULOCYTE %: 0 % (ref 0.0–1.0)
LYMPHOCYTE #: 2.28 10*3/uL (ref 1.00–4.80)
LYMPHOCYTE %: 32 %
MCH: 29.5 pg (ref 26.0–32.0)
MCHC: 33.8 g/dL (ref 31.0–35.5)
MCV: 87.2 fL (ref 78.0–100.0)
MONOCYTE #: 0.58 10*3/uL (ref 0.20–1.10)
MONOCYTE %: 8 %
MPV: 10.5 fL (ref 8.7–12.5)
NEUTROPHIL #: 4.1 10*3/uL (ref 1.50–7.70)
NEUTROPHIL %: 58 %
PLATELETS: 247 10*3/uL (ref 150–400)
RBC: 4.78 10*6/uL (ref 4.50–6.10)
RDW-CV: 12.3 % (ref 11.5–15.5)
WBC: 7.1 10*3/uL (ref 3.7–11.0)

## 2022-02-24 LAB — BLUE TOP TUBE

## 2022-02-24 LAB — RED TOP TUBE

## 2022-02-24 MED ORDER — KETOROLAC 30 MG/ML (1 ML) INJECTION SOLUTION
30.0000 mg | INTRAMUSCULAR | Status: AC
Start: 2022-02-24 — End: 2022-02-24
  Administered 2022-02-24: 30 mg via INTRAVENOUS
  Filled 2022-02-24: qty 1

## 2022-02-24 MED ORDER — SODIUM CHLORIDE 0.9 % INTRAVENOUS PIGGYBACK
1.0000 g | INTRAVENOUS | Status: AC
Start: 2022-02-24 — End: 2022-02-24
  Administered 2022-02-24: 1 g via INTRAVENOUS
  Filled 2022-02-24: qty 10

## 2022-02-24 MED ORDER — CEFAZOLIN 1 GRAM SOLUTION FOR INJECTION
1.0000 g | INTRAMUSCULAR | Status: DC
Start: 2022-02-24 — End: 2022-02-24

## 2022-02-24 MED ORDER — CEFADROXIL 500 MG CAPSULE
500.0000 mg | ORAL_CAPSULE | Freq: Two times a day (BID) | ORAL | 0 refills | Status: DC
Start: 2022-02-24 — End: 2022-03-18

## 2022-02-24 NOTE — Discharge Instructions (Signed)
Called Dr. Zenda Alpers office (in with Dr. Celedonio Savage) and tell them Dr. Ranee Gosselin wanted you seen  within 48 hours (by Friday, 2/23).      If you develop any new or worsening symptoms return to the ED immediately    Do not smoke.

## 2022-02-24 NOTE — ED Triage Notes (Signed)
Punctured left index finger with nail 2 days ago. Progressive swelling and pain.

## 2022-02-24 NOTE — ED Provider Notes (Signed)
Department of Emergency Medicine    HPI - 02/24/2022        Patient is a 37 y.o.  male presenting to the ED with chief complaint of left index finger pain.  States he works with his hands and they are often cracked and sore.  Reports he may have punctured it with a nail a couple of days ago.  He has developed some pain in the middle phalanges of the left index finger with swelling.  It is stiff and he states it is difficult to flex.  He is able to extend without difficulty.  No drainage.  It is swollen.  No numbness.  No known injury or trauma.  Unsure if there could be a foreign body there or not.  Tetanus is not up-to-date.  No known bite.    Physical Exam:     Nursing note and vitals reviewed in Epic.    Patient has some cracks and healing wounds over the left index finger.  The middle phalanges is swollen on the volar/flexor side.  The swelling is localized over that area.  Patient does have some worsening pain with passive extension although for now it is localized over the area described above.  He is holding his finger slightly flexed at rest but he is able to extend it manually.  Swelling is currently localized over the flexor surface and it is noncircumferential  Results for orders placed or performed during the hospital encounter of 02/24/22 (from the past 12 hour(s))   BASIC METABOLIC PANEL   Result Value Ref Range    SODIUM 139 136 - 145 mmol/L    POTASSIUM 3.9 3.5 - 5.1 mmol/L    CHLORIDE 108 96 - 111 mmol/L    CO2 TOTAL 25 22 - 30 mmol/L    ANION GAP 6 4 - 13 mmol/L    CALCIUM 9.0 8.6 - 10.2 mg/dL    GLUCOSE 100 65 - 125 mg/dL    BUN 14 8 - 25 mg/dL    CREATININE 0.86 0.75 - 1.35 mg/dL    BUN/CREA RATIO 16 6 - 22    ESTIMATED GFR - MALE >90 >=60 mL/min/BSA   C-REACTIVE PROTEIN(CRP),INFLAMMATION   Result Value Ref Range    CRP INFLAMMATION 2.4 <8.0 mg/L   CBC WITH DIFF   Result Value Ref Range    WBC 7.1 3.7 - 11.0 x10^3/uL    RBC 4.78 4.50 - 6.10 x10^6/uL    HGB 14.1 13.4 - 17.5 g/dL    HCT 41.7 38.9  - 52.0 %    MCV 87.2 78.0 - 100.0 fL    MCH 29.5 26.0 - 32.0 pg    MCHC 33.8 31.0 - 35.5 g/dL    RDW-CV 12.3 11.5 - 15.5 %    PLATELETS 247 150 - 400 x10^3/uL    MPV 10.5 8.7 - 12.5 fL    NEUTROPHIL % 58.0 %    LYMPHOCYTE % 32.0 %    MONOCYTE % 8.0 %    EOSINOPHIL % 2.0 %    BASOPHIL % 0.0 %    NEUTROPHIL # 4.10 1.50 - 7.70 x10^3/uL    LYMPHOCYTE # 2.28 1.00 - 4.80 x10^3/uL    MONOCYTE # 0.58 0.20 - 1.10 x10^3/uL    EOSINOPHIL # 0.11 <=0.50 x10^3/uL    BASOPHIL # <0.10 <=0.20 x10^3/uL    IMMATURE GRANULOCYTE % 0.0 0.0 - 1.0 %    IMMATURE GRANULOCYTE # <0.10 <0.10 x10^3/uL     XR FINGER, 2ND/INDEX LEFT  Final Result   Generalized soft tissue swelling of the left second finger. No fracture or destructive bony lesion.         Signed by Tandy Gaw, MD            MDM:   During the patient's stay in the emergency department, the above listed imaging and/or labs were performed to assist with medical decision making and were reviewed by myself when available for review. Orders placed are listed at end of note.     Differential Diagnosis includes but is not limited to these high-risk diagnoses:  Foreign body, abscess, cellulitis, flexor tenosynovitis    Although patient does not currently have flexor tenosynovitis there is some concern that he could develop it in need surgery.  I discussed the patient with Dr. Ranee Gosselin.  At this point he recommended a trial of antibiotics and close follow-up in clinic within 48 hours.  Discussed this with patient and he was agreeable.  I also stressed to the patient that if things were to worsen he needs to return to the ED immediately as Dr. Ranee Gosselin would likely taken to the OR if that happened.  Patient expressed his understanding and was agreeable.                   Pt remained stable throughout the emergency department course.      Clinical Impression:  Finger infection    Disposition:  Discharge  Orders Placed This Encounter    XR FINGER, 2ND/INDEX LEFT    CBC/DIFF    BASIC  METABOLIC PANEL    C-REACTIVE PROTEIN(CRP),INFLAMMATION    CBC WITH DIFF    EXTRA TUBES    BLUE TOP TUBE    RED TOP TUBE    ketorolac (TORADOL) 30 mg/mL injection    ceFAZolin (ANCEF) 1 g in NS 100 mL IVPB minibag    cefadroxil (DURICEF) 500 mg Oral Capsule

## 2022-03-04 ENCOUNTER — Ambulatory Visit (HOSPITAL_COMMUNITY): Payer: Self-pay | Admitting: ORTHOPAEDIC SURGERY

## 2022-03-09 ENCOUNTER — Other Ambulatory Visit: Payer: Self-pay

## 2022-03-09 ENCOUNTER — Emergency Department
Admission: EM | Admit: 2022-03-09 | Discharge: 2022-03-09 | Discharge status: Against Medical Advice | Payer: BC Managed Care – PPO

## 2022-03-09 DIAGNOSIS — Z5321 Procedure and treatment not carried out due to patient leaving prior to being seen by health care provider: Secondary | ICD-10-CM | POA: Insufficient documentation

## 2022-03-15 ENCOUNTER — Encounter (INDEPENDENT_AMBULATORY_CARE_PROVIDER_SITE_OTHER): Payer: Self-pay | Admitting: Family

## 2022-03-16 DIAGNOSIS — E119 Type 2 diabetes mellitus without complications: Secondary | ICD-10-CM | POA: Diagnosis not present

## 2022-03-18 ENCOUNTER — Encounter (INDEPENDENT_AMBULATORY_CARE_PROVIDER_SITE_OTHER): Payer: Self-pay | Admitting: Family

## 2022-03-18 ENCOUNTER — Ambulatory Visit (INDEPENDENT_AMBULATORY_CARE_PROVIDER_SITE_OTHER): Payer: BC Managed Care – PPO | Admitting: Family

## 2022-03-18 ENCOUNTER — Telehealth (INDEPENDENT_AMBULATORY_CARE_PROVIDER_SITE_OTHER): Payer: Self-pay | Admitting: Family

## 2022-03-18 ENCOUNTER — Other Ambulatory Visit: Payer: Self-pay

## 2022-03-18 VITALS — BP 122/80 | HR 102 | Temp 97.1°F | Resp 18 | Ht 69.0 in | Wt 190.0 lb

## 2022-03-18 DIAGNOSIS — F419 Anxiety disorder, unspecified: Secondary | ICD-10-CM

## 2022-03-18 DIAGNOSIS — F909 Attention-deficit hyperactivity disorder, unspecified type: Secondary | ICD-10-CM

## 2022-03-18 DIAGNOSIS — F32A Depression, unspecified: Secondary | ICD-10-CM

## 2022-03-18 NOTE — Telephone Encounter (Signed)
Patient was advised that he needs to call insurance and see who they will pay for and where he can go, he left the office then came back in screaming and using profanity demanding to know how to get his medications back, advised he was given referral and list of contacts to call, he was advised to please leave, he continued and advised again to leave, he left very upset.

## 2022-03-18 NOTE — Nursing Note (Signed)
Pt is here for a follow up

## 2022-03-18 NOTE — Progress Notes (Unsigned)
[facility], [facility]  [address] [place] [address]  [phone]    [name] is an 37 y.o. male who is here for a routine follow up.     Subjective:   The patient has a history of anxiety and depression. Also has a history of ADHD.  Reports that he was in prison and was on gabapentin, strattera and vistaril.   He reports that while in prison he tried numerous medication regimens for his anxiety depression and ADHD and nothing worked but the above stated medications.  He reports that it has been greater than 1 year since he has had any of these medications.  Reports that he has been trying to get in with psychiatry but reports that he was working out of town and missed his appointment.  He reports that he tried to reschedule at [facility] after missing an appointment and he can not get in for another 6 months.  He reports he has a very demanding work schedule an often works out of town.  He reports that his anxiety and depression as well as ADHD seem to be worsening.  The patient denies any changes in sleep patterns.  Denies any SI or HI.  Patient reported multiple times in the room that he is here today to get the 3 stated above medications.  As the visit went on I did explained to the patient that gabapentin is used off-label for anxiety and depression and it is not something we prescribed from primary care for anxiety and depression.  That he would need to follow up with Psychiatry if this is the medication that he had been on in the past.  I also advised the patient that we do not manage ADHD in this office and therefore do not prescribe Strattera.  Recommended follow-up with psychiatry.  Did offer that we could start the patient back on his Vistaril for his anxiety and that we could try another medication for his anxiety and depression.  The patient became very angry and stated if he can not have all 3 of the above  medications that nothing will help him.  He did raise his voice in the room and I was able to calm him down after telling him that I will provide him with a referral to Psychiatry and phone numbers so that he could call and possibly get a sooner appointment quicker than 6 months.  I did advise him that [facility] does have a walk-in clinic.  I did advise the patient that when he is a walk-in they only do an intake and he may or may not see an actual psychiatrist that day.  I did advise the patient that there is unfortunately a weight for psychiatric services here in the localized area however he could call multiple places to see where he could get in the soonest for further treatment.  Also recommended that if he felt he needed treatment today we could get him to an inpatient facility and the patient became very angry.  He started pacing around the room.  Was unable to assess the patient as he said he would just take the referral and leave.  Patient left the room and went to the front desk.        Allergies   Allergen Reactions    Penicillins      No current outpatient medications on file.  Past Medical History:   Diagnosis  Date    Allergic rhinitis     Depression     Essential hypertension     Fracture of nasal bones     Headache     Nosebleed     Sinusitis     Viral hepatitis C          Past Surgical History:   Procedure Laterality Date    HX HAND SURGERY      HX ORBITAL SURGERY Left 03/31/2020    Orbital fx repair os         Social History     Socioeconomic History    Marital status: Single     Spouse name: Not on file    Number of children: Not on file    Years of education: Not on file    Highest education level: Not on file   Occupational History    Not on file   Tobacco Use    Smoking status: Every Day     Current packs/day: 0.00     Types: Cigarettes     Last attempt to quit: 03/31/2020     Years since quitting: 1.9     Passive exposure: Current    Smokeless tobacco: Never   Vaping Use    Vaping status:  Never Used   Substance and Sexual Activity    Alcohol use: No     Comment: denies    Drug use: Not Currently     Types: Cocaine    Sexual activity: Not on file   Other Topics Concern    Abuse/Domestic Violence Not Asked    Caffeine Concern Not Asked    Calcium intake adequate Not Asked    Computer Use Not Asked    Drives Not Asked    Exercise Concern Not Asked    Helmet Use Not Asked    Seat Belt Not Asked    Special Diet Not Asked    Sunscreen used Not Asked    Uses Cane Not Asked    Uses walker Not Asked    Uses wheelchair Not Asked    Right hand dominant Not Asked    Left hand dominant Not Asked    Ambidextrous Not Asked    Shift Work Not Asked    Unusual Sleep-Wake Schedule Not Asked    Ability to Walk 1 Flight of Steps without SOB/CP Not Asked    Routine Exercise Yes     Comment: cardio     Ability to Walk 2 Flight of Steps without SOB/CP Yes    Unable to Ambulate Not Asked    Total Care Not Asked    Ability To Do Own ADL's Yes    Uses Walker Not Asked    Other Activity Level No    Uses Cane Not Asked   Social History Narrative    Not on file     Social Determinants of Health     Financial Resource Strain: Not on file   Transportation Needs: Not on file   Social Connections: Not on file   Intimate Partner Violence: Not on file   Housing Stability: Not on file           ICD-10-CM    1. Anxiety and depression  F41.9 Refer to External Provider    F32.A       2. ADHD (attention deficit hyperactivity disorder)  F90.9 Refer to External Provider      As above I did place a referral and recommended follow up with Psychiatry  for further recommendations.  Patient was very angry when leaving the office.      Baird Cancer, CRNP

## 2022-03-22 ENCOUNTER — Telehealth (HOSPITAL_COMMUNITY): Payer: Self-pay

## 2022-03-22 NOTE — Telephone Encounter (Addendum)
-----   Message from Felipe Drone, [place] sent at 03/22/2022  2:46 PM EDT -----    I got him with [facility] on Thursday!  Thank you so much for responding quickly. I saw the note in there from [facility] approving a visit so I thought I'd check with her as well and she gave me the go for [facility] this week.    Thank you again!  Shelly    ----- Message -----  From: Vonna Drafts., CASE MANAGER  Sent: 03/22/2022   2:35 PM EDT  To: Felipe Drone, [facility]    I haven't even got to the December 2022 waiting list yet, but if he is still there you can schedule him for a NPV with next available PGY-1, 2, or 3..    ----- Message -----  From: Felipe Drone, [facility]  Sent: 03/22/2022   2:31 PM EDT  To: Army Melia. Col[place]ta [place], [place]!  This pt is here desperately seeking psych help. He states his PCP, Dr Lenoria Chime has sent in a referral and is wondering if he can be seen any time soon. I see he has been wait listed since Dec. Anything you can do??      Shelly

## 2022-03-25 ENCOUNTER — Telehealth: Payer: MEDICAID | Admitting: NURSE PRACTITIONER-ADULT HEALTH

## 2022-03-25 DIAGNOSIS — F32A Depression, unspecified: Secondary | ICD-10-CM

## 2022-03-25 DIAGNOSIS — F909 Attention-deficit hyperactivity disorder, unspecified type: Secondary | ICD-10-CM

## 2022-03-25 DIAGNOSIS — F1911 Other psychoactive substance abuse, in remission: Secondary | ICD-10-CM

## 2022-03-25 DIAGNOSIS — G4700 Insomnia, unspecified: Secondary | ICD-10-CM

## 2022-03-25 DIAGNOSIS — F411 Generalized anxiety disorder: Secondary | ICD-10-CM

## 2022-03-25 MED ORDER — HYDROXYZINE PAMOATE 25 MG CAPSULE
ORAL_CAPSULE | ORAL | 2 refills | Status: DC
Start: 2022-03-25 — End: 2022-04-20

## 2022-03-25 MED ORDER — DOXEPIN 10 MG CAPSULE
10.0000 mg | ORAL_CAPSULE | Freq: Every evening | ORAL | 0 refills | Status: DC
Start: 2022-03-25 — End: 2022-04-20
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
## 2022-04-07 ENCOUNTER — Ambulatory Visit (INDEPENDENT_AMBULATORY_CARE_PROVIDER_SITE_OTHER): Payer: BC Managed Care – PPO | Admitting: Internal Medicine

## 2022-04-07 ENCOUNTER — Encounter: Payer: Self-pay | Admitting: Internal Medicine

## 2022-04-07 VITALS — BP 160/102 | HR 96 | Temp 98.2°F | Ht 72.0 in

## 2022-04-07 DIAGNOSIS — R9431 Abnormal electrocardiogram [ECG] [EKG]: Secondary | ICD-10-CM | POA: Diagnosis not present

## 2022-04-07 DIAGNOSIS — I10 Essential (primary) hypertension: Secondary | ICD-10-CM

## 2022-04-07 DIAGNOSIS — E1169 Type 2 diabetes mellitus with other specified complication: Secondary | ICD-10-CM | POA: Diagnosis not present

## 2022-04-07 DIAGNOSIS — E1165 Type 2 diabetes mellitus with hyperglycemia: Secondary | ICD-10-CM

## 2022-04-07 DIAGNOSIS — Z794 Long term (current) use of insulin: Secondary | ICD-10-CM

## 2022-04-07 DIAGNOSIS — E785 Hyperlipidemia, unspecified: Secondary | ICD-10-CM

## 2022-04-07 NOTE — Progress Notes (Signed)
Subjective:  Patient ID: Jesse Duncan, male    DOB: 15-Jul-1985  Age: 37 y.o. MRN: [id number]  CC: Hypertension, Diabetes, and Hyperlipidemia   HPI Jesse Duncan presents for f/up ----  According to prescription refills he is not taking any antihypertensives or anything for blood sugar control.  He complains of a several week history of blurred vision.  He has sleep apnea that is not currently being treated.  Outpatient Medications Prior to Visit  Medication Sig Dispense Refill   carvedilol (COREG) 12.5 MG tablet Take 1 tablet (12.5 mg total) by mouth 2 (two) times daily with a meal. 60 tablet 3   diltiazem (CARDIZEM CD) 300 MG 24 hr capsule TAKE 1 CAPSULE BY MOUTH EVERY DAY 90 capsule 1   glucose blood test strip Use as instructed 100 each 12   insulin aspart (FIASP) 100 UNIT/ML FlexTouch Pen Inject 10 Units into the skin 3 (three) times daily with meals. 15 mL 1   insulin glargine, 1 Unit Dial, (TOUJEO SOLOSTAR) 300 UNIT/ML Solostar Pen Inject 40 Units into the skin daily. 15 mL 3   Insulin Pen Needle (BD PEN NEEDLE NANO U/F) 32G X 4 MM MISC 1 Act by Subdermal route in the morning, at noon, in the evening, and at bedtime. Use to inject insulin once daily 400 each 1   metFORMIN (GLUCOPHAGE) 1000 MG tablet Take 1 tablet (1,000 mg total) by mouth 2 (two) times daily with a meal. 180 tablet 3   nicotine polacrilex (NICORETTE MINI) 4 MG lozenge Use 4mg  three times a day to stop smoking 100 tablet 4   OneTouch UltraSoft 2 Lancets MISC 1 Units by Does not apply route 3 (three) times daily. Before meals 100 each 4   Semaglutide (RYBELSUS) 7 MG TABS Take 7 mg by mouth daily. 60 tablet 2   valsartan-hydrochlorothiazide (DIOVAN-HCT) 320-25 MG tablet Take 1 tablet by mouth daily. 90 tablet 3   rosuvastatin (CRESTOR) 40 MG tablet Take 1 tablet (40 mg total) by mouth daily. 90 tablet 1   No facility-administered medications prior to visit.    ROS Review of Systems  Constitutional:   Negative for diaphoresis and fatigue.  HENT: Negative.    Eyes:  Positive for visual disturbance. Negative for photophobia and redness.  Respiratory:  Positive for apnea. Negative for chest tightness, shortness of breath and wheezing.   Cardiovascular:  Negative for chest pain, palpitations and leg swelling.  Gastrointestinal:  Negative for abdominal pain, constipation, diarrhea, nausea and vomiting.  Genitourinary: Negative.  Negative for difficulty urinating and dysuria.  Musculoskeletal: Negative.  Negative for arthralgias and myalgias.  Skin: Negative.   Neurological:  Negative for dizziness, weakness, light-headedness, numbness and headaches.  Hematological:  Negative for adenopathy. Does not bruise/bleed easily.  Psychiatric/Behavioral: Negative.      Objective:  BP (!) 160/102 (BP Location: Left Arm, Patient Position: Sitting, Cuff Size: Large)   Pulse 96   Temp 98.2 F (36.8 C) (Oral)   Ht 6' (1.829 m)   SpO2 96%   BMI 35.13 kg/m   BP Readings from Last 3 Encounters:  04/09/22 (!) 142/92  04/07/22 (!) 160/102  09/28/21 (!) 152/100    Wt Readings from Last 3 Encounters:  04/08/22 261 lb (118.4 kg)  09/28/21 259 lb (117.5 kg)  06/02/21 265 lb (120.2 kg)    Physical Exam Vitals reviewed.  Constitutional:      Appearance: He is not ill-appearing.  HENT:     Nose: Nose normal.  Mouth/Throat:     Mouth: Mucous membranes are moist.  Eyes:     General: No scleral icterus.    Extraocular Movements: Extraocular movements intact.     Pupils: Pupils are equal, round, and reactive to light.  Cardiovascular:     Rate and Rhythm: Normal rate and regular rhythm.     Heart sounds: Normal heart sounds, S1 normal and S2 normal. No murmur heard.    No friction rub. No gallop.     Comments: EKG- NSR, 84 bpm Septal infarct pattern is new TWI in V4-V6 and inferior leads Pulmonary:     Effort: Pulmonary effort is normal.     Breath sounds: No stridor. No wheezing, rhonchi  or rales.  Abdominal:     General: Abdomen is flat.     Palpations: There is no mass.     Tenderness: There is no abdominal tenderness. There is no guarding.     Hernia: No hernia is present.  Musculoskeletal:     Cervical back: Neck supple.     Right lower leg: No edema.     Left lower leg: No edema.  Skin:    General: Skin is warm and dry.  Neurological:     General: No focal deficit present.     Mental Status: He is alert. Mental status is at baseline.  Psychiatric:        Mood and Affect: Mood normal.        Behavior: Behavior normal.     Lab Results  Component Value Date   WBC 8.5 04/08/2022   HGB 15.1 04/08/2022   HCT 44.9 04/08/2022   PLT 344 04/08/2022   GLUCOSE 323 (H) 04/08/2022   CHOL 349 (H) 04/08/2022   TRIG 378.0 (H) 04/08/2022   HDL 42.20 04/08/2022   LDLDIRECT 223.0 04/08/2022   LDLCALC 208 (H) 09/28/2021   ALT 13 04/08/2022   AST 13 04/08/2022   NA 132 (L) 04/08/2022   K 4.0 04/08/2022   CL 92 (L) 04/08/2022   CREATININE 0.95 04/08/2022   BUN 13 04/08/2022   CO2 25 04/08/2022   TSH 0.90 04/08/2022   PSA 1.57 10/18/2012   HGBA1C 14.3 (H) 04/08/2022   MICROALBUR 3.3 (H) 06/02/2021    CT Angio Chest/Abd/Pel for Dissection W and/or W/WO  Result Date: 03/16/2021 CLINICAL DATA:  Chest pain. EXAM: CT ANGIOGRAPHY CHEST, ABDOMEN AND PELVIS TECHNIQUE: Non-contrast CT of the chest was initially obtained. Multidetector CT imaging through the chest, abdomen and pelvis was performed using the standard protocol during bolus administration of intravenous contrast. Multiplanar reconstructed images and MIPs were obtained and reviewed to evaluate the vascular anatomy. RADIATION DOSE REDUCTION: This exam was performed according to the departmental dose-optimization program which includes automated exposure control, adjustment of the mA and/or kV according to patient size and/or use of iterative reconstruction technique. CONTRAST:  OMNIPAQUE IOHEXOL 350 MG/ML SOLN  COMPARISON:  None. FINDINGS: CTA CHEST FINDINGS Cardiovascular: Preferential opacification of the thoracic aorta. No evidence of thoracic aortic aneurysm or dissection. Mild cardiac enlargement.  No pericardial effusion identified. Mediastinum/Nodes: No enlarged mediastinal, hilar, or axillary lymph nodes. Thyroid gland, trachea, and esophagus demonstrate no significant findings. Lungs/Pleura: Lungs are clear. No pleural effusion or pneumothorax. Musculoskeletal: No chest wall abnormality. No acute or significant osseous findings. Review of the MIP images confirms the above findings. CTA ABDOMEN AND PELVIS FINDINGS VASCULAR Aorta: Normal caliber aorta without aneurysm, dissection, vasculitis or significant stenosis. Celiac: Patent without evidence of aneurysm, dissection, vasculitis or significant  stenosis. SMA: Patent without evidence of aneurysm, dissection, vasculitis or significant stenosis. Renals: Both renal arteries are patent without evidence of aneurysm, dissection, vasculitis, fibromuscular dysplasia or significant stenosis. IMA: Patent without evidence of aneurysm, dissection, vasculitis or significant stenosis. Inflow: Patent without evidence of aneurysm, dissection, vasculitis or significant stenosis. Veins: No obvious venous abnormality within the limitations of this arterial phase study. Review of the MIP images confirms the above findings. NON-VASCULAR Hepatobiliary: No focal liver abnormality is seen. No gallstones, gallbladder wall thickening, or biliary dilatation. Pancreas: Unremarkable. No pancreatic ductal dilatation or surrounding inflammatory changes. Spleen: Normal in size without focal abnormality. Adrenals/Urinary Tract: Normal adrenal glands. No kidney mass or hydronephrosis identified. Bladder is unremarkable. Stomach/Bowel: Stomach appears within normal limits. No bowel wall thickening, inflammation, or distension. The appendix is visualized and is normal. Lymphatic: No abdominopelvic  adenopathy. Reproductive: Prostate is unremarkable. Other: No free fluid or fluid collection within the abdomen or pelvis. No signs of pneumoperitoneum. Musculoskeletal: No acute or significant osseous findings. Review of the MIP images confirms the above findings. IMPRESSION: 1. No evidence of aortic aneurysm or dissection. 2. No acute findings within the chest, abdomen or pelvis to explain patient's chest pain. 3. Mild cardiac enlargement. Electronically Signed   By: Signa Kell M.D.   On: 03/16/2021 09:47   DG Chest 2 View  Result Date: 03/16/2021 CLINICAL DATA:  Chest pain and shortness of breath. EXAM: CHEST - 2 VIEW COMPARISON:  None. FINDINGS: The heart size and mediastinal contours are within normal limits. Both lungs are clear. The visualized skeletal structures are unremarkable. IMPRESSION: No active cardiopulmonary disease. Electronically Signed   By: Signa Kell M.D.   On: 03/16/2021 06:34    Assessment & Plan:   Hyperlipidemia associated with type 2 diabetes mellitus -     Lipid panel; Future -     TSH; Future -     Hepatic function panel; Future  Primary hypertension- He has hypertensive urgency. -     TSH; Future -     CBC with Differential/Platelet; Future -     Basic metabolic panel; Future -     EKG 12-Lead -     Urinalysis, Routine w reflex microscopic; Future  Type 2 diabetes mellitus with hyperglycemia, with long-term current use of insulin- His blood sugar is over 500 so he was referred to the ED. -     Hemoglobin A1c; Future -     Basic metabolic panel; Future  Abnormal electrocardiogram (ECG) (EKG) -     Troponin I (High Sensitivity); Future -     Brain natriuretic peptide; Future -     ECHOCARDIOGRAM COMPLETE; Future  Other orders -     LDL cholesterol, direct     Follow-up: No follow-ups on file.  Sanda Linger, MD

## 2022-04-08 ENCOUNTER — Encounter (HOSPITAL_COMMUNITY): Payer: Self-pay

## 2022-04-08 ENCOUNTER — Other Ambulatory Visit: Payer: Self-pay

## 2022-04-08 ENCOUNTER — Emergency Department (HOSPITAL_COMMUNITY)
Admission: EM | Admit: 2022-04-08 | Discharge: 2022-04-09 | Disposition: A | Discharge status: Home / Self Care | Payer: Self-pay | Attending: Emergency Medicine | Admitting: Emergency Medicine

## 2022-04-08 ENCOUNTER — Telehealth: Payer: Self-pay | Admitting: *Deleted

## 2022-04-08 DIAGNOSIS — R9431 Abnormal electrocardiogram [ECG] [EKG]: Secondary | ICD-10-CM | POA: Insufficient documentation

## 2022-04-08 DIAGNOSIS — E1165 Type 2 diabetes mellitus with hyperglycemia: Secondary | ICD-10-CM | POA: Diagnosis not present

## 2022-04-08 DIAGNOSIS — R739 Hyperglycemia, unspecified: Secondary | ICD-10-CM | POA: Insufficient documentation

## 2022-04-08 HISTORY — DX: Prediabetes: R73.03

## 2022-04-08 HISTORY — DX: Essential (primary) hypertension: I10

## 2022-04-08 LAB — BASIC METABOLIC PANEL
Anion gap: 15 (ref 5–15)
BUN: 13 mg/dL (ref 6–23)
BUN: 13 mg/dL (ref 6–20)
CO2: 28 mEq/L (ref 19–32)
CO2: 25 mmol/L (ref 22–32)
Calcium: 9.8 mg/dL (ref 8.4–10.5)
Calcium: 9.9 mg/dL (ref 8.9–10.3)
Chloride: 92 mEq/L — ABNORMAL LOW (ref 96–112)
Chloride: 92 mmol/L — ABNORMAL LOW (ref 98–111)
Creatinine, Ser: 1.06 mg/dL (ref 0.40–1.50)
Creatinine, Ser: 0.95 mg/dL (ref 0.61–1.24)
GFR, Estimated: >60 mL/min (ref >60)
GFR: 90.7 mL/min (ref >60.00)
Glucose, Bld: 508 mg/dL (ref 70–99)
Glucose, Bld: 323 mg/dL — ABNORMAL HIGH (ref 70–99)
Potassium: 4.3 mEq/L (ref 3.5–5.1)
Potassium: 4 mmol/L (ref 3.5–5.1)
Sodium: 130 mEq/L — ABNORMAL LOW (ref 135–145)
Sodium: 132 mmol/L — ABNORMAL LOW (ref 135–145)

## 2022-04-08 LAB — URINALYSIS, ROUTINE W REFLEX MICROSCOPIC
Bacteria, UA: NONE SEEN
Bilirubin Urine: NEGATIVE
Bilirubin Urine: NEGATIVE
Glucose, UA: >=500 mg/dL — AB
Hgb urine dipstick: NEGATIVE
Hgb urine dipstick: NEGATIVE
Ketones, ur: NEGATIVE
Ketones, ur: NEGATIVE mg/dL
Leukocytes,Ua: NEGATIVE
Leukocytes,Ua: NEGATIVE
Nitrite: NEGATIVE
Nitrite: NEGATIVE
Protein, ur: NEGATIVE mg/dL
RBC / HPF: NONE SEEN (ref 0–?)
Specific Gravity, Urine: 1.01 (ref 1.000–1.030)
Specific Gravity, Urine: 1.03 (ref 1.005–1.030)
Total Protein, Urine: NEGATIVE
Urine Glucose: >=1000 — AB
Urobilinogen, UA: 1 (ref 0.0–1.0)
pH: 6.5 (ref 5.0–8.0)
pH: 5 (ref 5.0–8.0)

## 2022-04-08 LAB — LDL CHOLESTEROL, DIRECT: Direct LDL: 223 mg/dL

## 2022-04-08 LAB — CBC WITH DIFFERENTIAL/PLATELET
Basophils Absolute: 0 10*3/uL (ref 0.0–0.1)
Basophils Relative: 0.5 % (ref 0.0–3.0)
Eosinophils Absolute: 0.1 10*3/uL (ref 0.0–0.7)
Eosinophils Relative: 2.1 % (ref 0.0–5.0)
HCT: 41.8 % (ref 39.0–52.0)
Hemoglobin: 13.8 g/dL (ref 13.0–17.0)
Lymphocytes Relative: 41.8 % (ref 12.0–46.0)
Lymphs Abs: 2.2 10*3/uL (ref 0.7–4.0)
MCHC: 33 g/dL (ref 30.0–36.0)
MCV: 80.1 fl (ref 78.0–100.0)
Monocytes Absolute: 0.4 10*3/uL (ref 0.1–1.0)
Monocytes Relative: 6.6 % (ref 3.0–12.0)
Neutro Abs: 2.6 10*3/uL (ref 1.4–7.7)
Neutrophils Relative %: 49 % (ref 43.0–77.0)
Platelets: 287 10*3/uL (ref 150.0–400.0)
RBC: 5.22 Mil/uL (ref 4.22–5.81)
RDW: 13.3 % (ref 11.5–15.5)
WBC: 5.4 10*3/uL (ref 4.0–10.5)

## 2022-04-08 LAB — TSH: TSH: 0.9 u[IU]/mL (ref 0.35–5.50)

## 2022-04-08 LAB — LIPID PANEL
Cholesterol: 349 mg/dL — ABNORMAL HIGH (ref 0–200)
HDL: 42.2 mg/dL (ref >39.00)
NonHDL: 306.69
Total CHOL/HDL Ratio: 8
Triglycerides: 378 mg/dL — ABNORMAL HIGH (ref 0.0–149.0)
VLDL: 75.6 mg/dL — ABNORMAL HIGH (ref 0.0–40.0)

## 2022-04-08 LAB — HEPATIC FUNCTION PANEL
ALT: 13 U/L (ref 0–53)
AST: 13 U/L (ref 0–37)
Albumin: 4.3 g/dL (ref 3.5–5.2)
Alkaline Phosphatase: 82 U/L (ref 39–117)
Bilirubin, Direct: 0.1 mg/dL (ref 0.0–0.3)
Total Bilirubin: 0.6 mg/dL (ref 0.2–1.2)
Total Protein: 7.4 g/dL (ref 6.0–8.3)

## 2022-04-08 LAB — CBC
HCT: 44.9 % (ref 39.0–52.0)
Hemoglobin: 15.1 g/dL (ref 13.0–17.0)
MCH: 26.8 pg (ref 26.0–34.0)
MCHC: 33.6 g/dL (ref 30.0–36.0)
MCV: 79.6 fL — ABNORMAL LOW (ref 80.0–100.0)
Platelets: 344 10*3/uL (ref 150–400)
RBC: 5.64 MIL/uL (ref 4.22–5.81)
RDW: 12.5 % (ref 11.5–15.5)
WBC: 8.5 10*3/uL (ref 4.0–10.5)
nRBC: 0 % (ref 0.0–0.2)

## 2022-04-08 LAB — TROPONIN I (HIGH SENSITIVITY): High Sens Troponin I: 7 ng/L (ref 2–17)

## 2022-04-08 LAB — CBG MONITORING, ED: Glucose-Capillary: 317 mg/dL — ABNORMAL HIGH (ref 70–99)

## 2022-04-08 LAB — HEMOGLOBIN A1C: Hgb A1c MFr Bld: 14.3 % — ABNORMAL HIGH (ref 4.6–6.5)

## 2022-04-08 LAB — BRAIN NATRIURETIC PEPTIDE: Pro B Natriuretic peptide (BNP): 30 pg/mL (ref 0.0–100.0)

## 2022-04-08 NOTE — Telephone Encounter (Signed)
Called patient and informed him of his high blood glucose and doctor recommendation of going to the ED. Patient states he is going to go today.

## 2022-04-08 NOTE — Telephone Encounter (Signed)
CRITICAL VALUE STICKER  CRITICAL VALUE: Glucose 508  RECEIVER (on-site recipient of call): Jesse Duncan   DATE & TIME NOTIFIED: 04/08/2022 @ 4:58pm  MESSENGER (representative from lab): Hope Velora Heckler lba  MD NOTIFIED: yes  TIME OF NOTIFICATION:4:59pm  RESPONSE:  Awaiting response

## 2022-04-08 NOTE — ED Triage Notes (Signed)
Pt sent by PCP for glucose of 580. Pt c/o thirst and increased urination int. Pt says the vision in his right eye is weakx3wks.

## 2022-04-09 ENCOUNTER — Encounter: Payer: Self-pay | Admitting: Internal Medicine

## 2022-04-09 LAB — CBG MONITORING, ED: Glucose-Capillary: 399 mg/dL — ABNORMAL HIGH (ref 70–99)

## 2022-04-09 NOTE — Discharge Instructions (Addendum)
Your blood sugar is down trending compared to your outpatient test. Please take your medications as directed and try to decrease sugary drinks in your diet.

## 2022-04-09 NOTE — ED Provider Notes (Signed)
[facility] Emergency Department Provider Note MRN:  [id number]  Arrival date & time: 04/09/22     Chief Complaint   abnormal labs   History of Present Illness   Jesse HesselbachChristopher Duncan is a 37 y.o. year-old male presents to the ED with chief complaint of hyperglycemia.  Patient reports that he has been non-compliant with his medications and also drank 3 non-diet sodas today.  He states his PCP referred him to the ED after a critical glucose of 599.  He reports polydipsia and polyuria today.  Otherwise, says he feels well.  History provided by patient.   Review of Systems  Pertinent positive and negative review of systems noted in HPI.    Physical Exam   Vitals:   04/09/22 0023 04/09/22 0100  BP:  (!) 142/92  Pulse:  71  Resp: 20 (!) 23  Temp:    SpO2:  95%    CONSTITUTIONAL:  non toxic-appearing, NAD NEURO:  Alert and oriented x 3, CN 3-12 grossly intact EYES:  eyes equal and reactive ENT/NECK:  Supple, no stridor  CARDIO:  normal rate, regular rhythm, appears well-perfused  PULM:  No respiratory distress,  GI/GU:  non-distended,  MSK/SPINE:  No gross deformities, no edema, moves all extremities  SKIN:  no rash, atraumatic   *Additional and/or pertinent findings included in MDM below  Diagnostic and Interventional Summary    EKG Interpretation  Date/Time:    Ventricular Rate:    PR Interval:    QRS Duration:   QT Interval:    QTC Calculation:   R Axis:     Text Interpretation:         Labs Reviewed  BASIC METABOLIC PANEL - Abnormal; Notable for the following components:      Result Value   Sodium 132 (*)    Chloride 92 (*)    Glucose, Bld 323 (*)    All other components within normal limits  CBC - Abnormal; Notable for the following components:   MCV 79.6 (*)    All other components within normal limits  URINALYSIS, ROUTINE W REFLEX MICROSCOPIC - Abnormal; Notable for the following components:   Glucose, UA >=500 (*)    All  other components within normal limits  CBG MONITORING, ED - Abnormal; Notable for the following components:   Glucose-Capillary 317 (*)    All other components within normal limits  CBG MONITORING, ED - Abnormal; Notable for the following components:   Glucose-Capillary 399 (*)    All other components within normal limits    No orders to display    Medications - No data to display   Procedures  /  Critical Care Procedures  ED Course and Medical Decision Making  I have reviewed the triage vital signs, the nursing notes, and pertinent available records from the EMR.  Social Determinants Affecting Complexity of Care: Patient has no clinically significant social determinants affecting this chief complaint..   ED Course:    Medical Decision Making Here for hyperglycemia.  Labs ordered in triage.  Amount and/or Complexity of Data Reviewed Labs: ordered.    Details: Glucose 399.  No significant anion gap.  Doesn't appear consistent with DKA.  No distress.  DC to home with instructions to take meds, watch diet, and follow-up with PCP.     Consultants: No consultations were needed in caring for this patient.   Treatment and Plan: Emergency department workup does not suggest an emergent condition requiring admission or immediate intervention beyond  what has been performed at this time. The patient is safe for discharge and has  been instructed to return immediately for worsening symptoms, change in  symptoms or any other concerns    Final Clinical Impressions(s) / ED Diagnoses     ICD-10-CM   1. Hyperglycemia  R73.9       ED Discharge Orders     None         Discharge Instructions Discussed with and Provided to Patient:    Discharge Instructions      Your blood sugar is down trending compared to your outpatient test. Please take your medications as directed and try to decrease sugary drinks in your diet.      Roxy Horseman, PA-C 04/09/22 0126    Tilden Fossa, MD 04/09/22 [phone]

## 2022-04-09 NOTE — ED Notes (Signed)
Pt denies complaints outside of some blurry vision in the right eye. Pt states he is noncompliant with medications at home, risks explained to pt about not taking his medications as prescribed. Pt verbalized understanding. Call bell in reach. No acute distress noted, bed locked & low.

## 2022-04-10 ENCOUNTER — Other Ambulatory Visit: Payer: Self-pay | Admitting: Internal Medicine

## 2022-04-10 DIAGNOSIS — E1169 Type 2 diabetes mellitus with other specified complication: Secondary | ICD-10-CM

## 2022-04-10 DIAGNOSIS — E7801 Familial hypercholesterolemia: Secondary | ICD-10-CM

## 2022-04-10 DIAGNOSIS — E119 Type 2 diabetes mellitus without complications: Secondary | ICD-10-CM

## 2022-04-10 MED ORDER — ROSUVASTATIN CALCIUM 40 MG PO TABS
40.0000 mg | ORAL_TABLET | Freq: Every day | ORAL | 1 refills | Status: DC
Start: 2022-04-10 — End: 2022-04-23

## 2022-04-10 MED ORDER — INSULIN ASPART (W/NIACINAMIDE) 100 UNIT/ML ~~LOC~~ SOPN
10.0000 [IU] | PEN_INJECTOR | Freq: Three times a day (TID) | SUBCUTANEOUS | 1 refills | Status: DC
Start: 2022-04-10 — End: 2022-06-18

## 2022-04-10 MED ORDER — METFORMIN HCL 1000 MG PO TABS
1000.0000 mg | ORAL_TABLET | Freq: Two times a day (BID) | ORAL | 0 refills | Status: DC
Start: 2022-04-10 — End: 2022-09-22

## 2022-04-10 MED ORDER — BD PEN NEEDLE NANO U/F 32G X 4 MM MISC
1.0000 | Freq: Four times a day (QID) | 1 refills | Status: DC
Start: 2022-04-10 — End: 2023-01-10

## 2022-04-10 MED ORDER — TOUJEO SOLOSTAR 300 UNIT/ML ~~LOC~~ SOPN
40.0000 [IU] | PEN_INJECTOR | Freq: Every day | SUBCUTANEOUS | 3 refills | Status: DC
Start: 2022-04-10 — End: 2022-09-22

## 2022-04-13 ENCOUNTER — Encounter (INDEPENDENT_AMBULATORY_CARE_PROVIDER_SITE_OTHER): Payer: BC Managed Care – PPO | Admitting: NURSE PRACTITIONER-ADULT HEALTH

## 2022-04-13 ENCOUNTER — Encounter (INDEPENDENT_AMBULATORY_CARE_PROVIDER_SITE_OTHER): Payer: Self-pay | Admitting: NURSE PRACTITIONER-ADULT HEALTH

## 2022-04-14 ENCOUNTER — Encounter (INDEPENDENT_AMBULATORY_CARE_PROVIDER_SITE_OTHER): Payer: Self-pay

## 2022-04-20 ENCOUNTER — Ambulatory Visit (INDEPENDENT_AMBULATORY_CARE_PROVIDER_SITE_OTHER): Payer: BC Managed Care – PPO | Admitting: Internal Medicine

## 2022-04-20 ENCOUNTER — Other Ambulatory Visit: Payer: Self-pay | Admitting: Internal Medicine

## 2022-04-20 ENCOUNTER — Encounter: Payer: Self-pay | Admitting: Internal Medicine

## 2022-04-20 ENCOUNTER — Encounter (INDEPENDENT_AMBULATORY_CARE_PROVIDER_SITE_OTHER): Payer: BC Managed Care – PPO | Admitting: NURSE PRACTITIONER-ADULT HEALTH

## 2022-04-20 ENCOUNTER — Other Ambulatory Visit (INDEPENDENT_AMBULATORY_CARE_PROVIDER_SITE_OTHER): Payer: Self-pay | Admitting: NURSE PRACTITIONER-ADULT HEALTH

## 2022-04-20 VITALS — BP 152/94 | HR 85 | Temp 98.5°F | Ht 73.0 in | Wt 271.0 lb

## 2022-04-20 DIAGNOSIS — E119 Type 2 diabetes mellitus without complications: Secondary | ICD-10-CM | POA: Diagnosis not present

## 2022-04-20 DIAGNOSIS — R4781 Slurred speech: Secondary | ICD-10-CM

## 2022-04-20 DIAGNOSIS — I10 Essential (primary) hypertension: Secondary | ICD-10-CM

## 2022-04-20 DIAGNOSIS — R200 Anesthesia of skin: Secondary | ICD-10-CM | POA: Diagnosis not present

## 2022-04-20 DIAGNOSIS — Z794 Long term (current) use of insulin: Secondary | ICD-10-CM | POA: Diagnosis not present

## 2022-04-20 LAB — BASIC METABOLIC PANEL
BUN: 12 mg/dL (ref 6–23)
CO2: 30 mEq/L (ref 19–32)
Calcium: 9.5 mg/dL (ref 8.4–10.5)
Chloride: 99 mEq/L (ref 96–112)
Creatinine, Ser: 0.89 mg/dL (ref 0.40–1.50)
GFR: 110.73 mL/min (ref >60.00)
Glucose, Bld: 266 mg/dL — ABNORMAL HIGH (ref 70–99)
Potassium: 3.9 mEq/L (ref 3.5–5.1)
Sodium: 135 mEq/L (ref 135–145)

## 2022-04-20 NOTE — Progress Notes (Signed)
Subjective:  Patient ID: Jesse Duncan, male    DOB: October 27, 1985  Age: 37 y.o. MRN: [id number]  CC: Hypertension and Diabetes   HPI Jesse Duncan presents for f/up  ---  He complains of a 4 day history of slurred speech, right facial and right hand numbness.  Outpatient Medications Prior to Visit  Medication Sig Dispense Refill   carvedilol (COREG) 12.5 MG tablet Take 1 tablet (12.5 mg total) by mouth 2 (two) times daily with a meal. 60 tablet 3   diltiazem (CARDIZEM CD) 300 MG 24 hr capsule TAKE 1 CAPSULE BY MOUTH EVERY DAY 90 capsule 1   glucose blood test strip Use as instructed 100 each 12   insulin aspart (FIASP) 100 UNIT/ML FlexTouch Pen Inject 10 Units into the skin 3 (three) times daily with meals. 15 mL 1   insulin glargine, 1 Unit Dial, (TOUJEO SOLOSTAR) 300 UNIT/ML Solostar Pen Inject 40 Units into the skin daily. 45 mL 3   Insulin Pen Needle (BD PEN NEEDLE NANO U/F) 32G X 4 MM MISC 1 Act by Subdermal route in the morning, at noon, in the evening, and at bedtime. Use to inject insulin once daily 400 each 1   metFORMIN (GLUCOPHAGE) 1000 MG tablet Take 1 tablet (1,000 mg total) by mouth 2 (two) times daily with a meal. 180 tablet 0   nicotine polacrilex (NICORETTE MINI) 4 MG lozenge Use  three times a day to stop smoking 100 tablet 4   OneTouch UltraSoft 2 Lancets MISC 1 Units by Does not apply route 3 (three) times daily. Before meals 100 each 4   rosuvastatin (CRESTOR) 40 MG tablet Take 1 tablet (40 mg total) by mouth daily. 90 tablet 1   Semaglutide (RYBELSUS) 7 MG TABS Take 7 mg by mouth daily. 60 tablet 2   valsartan-hydrochlorothiazide (DIOVAN-HCT) 320-25 MG tablet Take 1 tablet by mouth daily. 90 tablet 3   No facility-administered medications prior to visit.    ROS Review of Systems  Constitutional: Negative.  Negative for fatigue and unexpected weight change.  HENT: Negative.    Eyes:  Negative for visual disturbance.  Respiratory:  Negative for cough  and wheezing.   Cardiovascular: Negative.  Negative for chest pain, palpitations and leg swelling.  Gastrointestinal:  Negative for abdominal pain and diarrhea.  Genitourinary: Negative.   Musculoskeletal: Negative.   Skin: Negative.   Neurological:  Positive for speech difficulty and numbness. Negative for dizziness, seizures, weakness and headaches.  Hematological:  Negative for adenopathy. Does not bruise/bleed easily.  Psychiatric/Behavioral: Negative.      Objective:  BP (!) 152/94 (BP Location: Left Arm, Patient Position: Sitting, Cuff Size: Large)   Pulse 85   Temp 98.5 F (36.9 C) (Oral)   Ht  (1.854 m)   Wt 271 lb (122.9 kg)   SpO2 98%   BMI 35.75 kg/m   BP Readings from Last 3 Encounters:  04/20/22 (!) 152/94  04/09/22 (!) 142/92  04/07/22 (!) 160/102    Wt Readings from Last 3 Encounters:  04/20/22 271 lb (122.9 kg)  04/08/22 261 lb (118.4 kg)  09/28/21 259 lb (117.5 kg)    Physical Exam Vitals reviewed.  HENT:     Mouth/Throat:     Mouth: Mucous membranes are moist.  Eyes:     General: No visual field deficit.       Left eye: No discharge.     Conjunctiva/sclera: Conjunctivae normal.  Neck:     Thyroid: No thyroid mass or  thyromegaly.     Vascular: No carotid bruit.  Cardiovascular:     Rate and Rhythm: Normal rate and regular rhythm.     Pulses:          Carotid pulses are 1+ on the right side and 1+ on the left side.      Radial pulses are 1+ on the right side and 1+ on the left side.       Femoral pulses are 1+ on the right side and 1+ on the left side.      Popliteal pulses are 1+ on the right side and 1+ on the left side.       Dorsalis pedis pulses are 1+ on the right side and 1+ on the left side.       Posterior tibial pulses are 1+ on the right side and 1+ on the left side.     Heart sounds: No murmur heard. Pulmonary:     Effort: Pulmonary effort is normal.     Breath sounds: No stridor. No wheezing, rhonchi or rales.  Abdominal:      Palpations: There is no mass.     Tenderness: There is no abdominal tenderness. There is no guarding.     Hernia: No hernia is present.  Musculoskeletal:        General: Normal range of motion.     Cervical back: Neck supple.     Right lower leg: No edema.     Left lower leg: No edema.  Lymphadenopathy:     Cervical: No cervical adenopathy.  Skin:    General: Skin is warm and dry.  Neurological:     Mental Status: He is alert.     Cranial Nerves: Cranial nerve deficit, dysarthria and facial asymmetry present.     Motor: Motor function is intact. No weakness.     Coordination: Coordination is intact. Romberg sign negative. Coordination normal.     Gait: Gait is intact. Gait normal.  Psychiatric:        Mood and Affect: Mood normal.        Behavior: Behavior normal.     Lab Results  Component Value Date   WBC 8.5 04/08/2022   HGB 15.1 04/08/2022   HCT 44.9 04/08/2022   PLT 344 04/08/2022   GLUCOSE 323 (H) 04/08/2022   CHOL 349 (H) 04/08/2022   TRIG 378.0 (H) 04/08/2022   HDL 42.20 04/08/2022   LDLDIRECT 223.0 04/08/2022   LDLCALC 208 (H) 09/28/2021   ALT 13 04/08/2022   AST 13 04/08/2022   NA 132 (L) 04/08/2022   K 4.0 04/08/2022   CL 92 (L) 04/08/2022   CREATININE 0.95 04/08/2022   BUN 13 04/08/2022   CO2 25 04/08/2022   TSH 0.90 04/08/2022   PSA 1.57 10/18/2012   HGBA1C 14.3 (H) 04/08/2022   MICROALBUR 3.3 (H) 06/02/2021    No results found.  Assessment & Plan:   Insulin-requiring or dependent type II diabetes mellitus- Will monitor lytes and renal function. -     Basic metabolic panel; Future  Primary hypertension- His BP has improved. -     Basic metabolic panel; Future  Slurred speech- He is high risk for CVA. Will get an MRI of the brain. -     MR BRAIN WO CONTRAST; Future  Right facial numbness -     MR BRAIN WO CONTRAST; Future     Follow-up: No follow-ups on file.  Sanda Linger, MD

## 2022-04-21 MED ORDER — HYDROXYZINE PAMOATE 25 MG CAPSULE
ORAL_CAPSULE | ORAL | 2 refills | Status: DC
Start: 2022-04-21 — End: 2022-09-12

## 2022-04-21 MED ORDER — DOXEPIN 10 MG CAPSULE
10.0000 mg | ORAL_CAPSULE | Freq: Every evening | ORAL | 0 refills | Status: AC
Start: 2022-04-21 — End: 2022-05-21

## 2022-04-22 ENCOUNTER — Ambulatory Visit
Admission: RE | Admit: 2022-04-22 | Discharge: 2022-04-22 | Disposition: A | Discharge status: Home / Self Care | Payer: BC Managed Care – PPO | Source: Ambulatory Visit | Attending: Internal Medicine | Admitting: Internal Medicine

## 2022-04-22 ENCOUNTER — Other Ambulatory Visit: Payer: Self-pay

## 2022-04-22 ENCOUNTER — Inpatient Hospital Stay (HOSPITAL_COMMUNITY)
Admission: EM | Admit: 2022-04-22 | Discharge: 2022-04-23 | DRG: 064 | Disposition: A | Discharge status: Home / Self Care | Payer: BC Managed Care – PPO | Attending: Internal Medicine | Admitting: Internal Medicine

## 2022-04-22 ENCOUNTER — Encounter (HOSPITAL_COMMUNITY): Payer: Self-pay | Admitting: Emergency Medicine

## 2022-04-22 ENCOUNTER — Emergency Department (HOSPITAL_COMMUNITY): Payer: BC Managed Care – PPO

## 2022-04-22 DIAGNOSIS — Z91128 Patient's intentional underdosing of medication regimen for other reason: Secondary | ICD-10-CM

## 2022-04-22 DIAGNOSIS — I5042 Chronic combined systolic (congestive) and diastolic (congestive) heart failure: Secondary | ICD-10-CM | POA: Diagnosis present

## 2022-04-22 DIAGNOSIS — Z6835 Body mass index (BMI) 35.0-35.9, adult: Secondary | ICD-10-CM | POA: Diagnosis not present

## 2022-04-22 DIAGNOSIS — Z7984 Long term (current) use of oral hypoglycemic drugs: Secondary | ICD-10-CM | POA: Diagnosis not present

## 2022-04-22 DIAGNOSIS — I6502 Occlusion and stenosis of left vertebral artery: Secondary | ICD-10-CM | POA: Diagnosis present

## 2022-04-22 DIAGNOSIS — E119 Type 2 diabetes mellitus without complications: Secondary | ICD-10-CM | POA: Diagnosis not present

## 2022-04-22 DIAGNOSIS — Z720 Tobacco use: Secondary | ICD-10-CM | POA: Diagnosis not present

## 2022-04-22 DIAGNOSIS — R202 Paresthesia of skin: Secondary | ICD-10-CM | POA: Diagnosis not present

## 2022-04-22 DIAGNOSIS — I63512 Cerebral infarction due to unspecified occlusion or stenosis of left middle cerebral artery: Secondary | ICD-10-CM | POA: Diagnosis present

## 2022-04-22 DIAGNOSIS — I639 Cerebral infarction, unspecified: Secondary | ICD-10-CM | POA: Diagnosis not present

## 2022-04-22 DIAGNOSIS — F17290 Nicotine dependence, other tobacco product, uncomplicated: Secondary | ICD-10-CM | POA: Diagnosis present

## 2022-04-22 DIAGNOSIS — Z7902 Long term (current) use of antithrombotics/antiplatelets: Secondary | ICD-10-CM

## 2022-04-22 DIAGNOSIS — I480 Paroxysmal atrial fibrillation: Secondary | ICD-10-CM | POA: Diagnosis not present

## 2022-04-22 DIAGNOSIS — Z8249 Family history of ischemic heart disease and other diseases of the circulatory system: Secondary | ICD-10-CM | POA: Diagnosis not present

## 2022-04-22 DIAGNOSIS — E1165 Type 2 diabetes mellitus with hyperglycemia: Secondary | ICD-10-CM | POA: Diagnosis not present

## 2022-04-22 DIAGNOSIS — E785 Hyperlipidemia, unspecified: Secondary | ICD-10-CM | POA: Diagnosis not present

## 2022-04-22 DIAGNOSIS — Z79899 Other long term (current) drug therapy: Secondary | ICD-10-CM

## 2022-04-22 DIAGNOSIS — Z7982 Long term (current) use of aspirin: Secondary | ICD-10-CM

## 2022-04-22 DIAGNOSIS — R270 Ataxia, unspecified: Secondary | ICD-10-CM | POA: Diagnosis present

## 2022-04-22 DIAGNOSIS — Y92009 Unspecified place in unspecified non-institutional (private) residence as the place of occurrence of the external cause: Secondary | ICD-10-CM

## 2022-04-22 DIAGNOSIS — I6389 Other cerebral infarction: Secondary | ICD-10-CM | POA: Diagnosis not present

## 2022-04-22 DIAGNOSIS — E1169 Type 2 diabetes mellitus with other specified complication: Secondary | ICD-10-CM | POA: Diagnosis present

## 2022-04-22 DIAGNOSIS — I6603 Occlusion and stenosis of bilateral middle cerebral arteries: Secondary | ICD-10-CM | POA: Diagnosis not present

## 2022-04-22 DIAGNOSIS — I63532 Cerebral infarction due to unspecified occlusion or stenosis of left posterior cerebral artery: Principal | ICD-10-CM | POA: Diagnosis present

## 2022-04-22 DIAGNOSIS — G8321 Monoplegia of upper limb affecting right dominant side: Secondary | ICD-10-CM | POA: Diagnosis not present

## 2022-04-22 DIAGNOSIS — F17210 Nicotine dependence, cigarettes, uncomplicated: Secondary | ICD-10-CM | POA: Diagnosis present

## 2022-04-22 DIAGNOSIS — Z794 Long term (current) use of insulin: Secondary | ICD-10-CM | POA: Diagnosis not present

## 2022-04-22 DIAGNOSIS — H53461 Homonymous bilateral field defects, right side: Secondary | ICD-10-CM | POA: Diagnosis not present

## 2022-04-22 DIAGNOSIS — H5340 Unspecified visual field defects: Secondary | ICD-10-CM | POA: Diagnosis present

## 2022-04-22 DIAGNOSIS — E7801 Familial hypercholesterolemia: Secondary | ICD-10-CM

## 2022-04-22 DIAGNOSIS — R9431 Abnormal electrocardiogram [ECG] [EKG]: Secondary | ICD-10-CM | POA: Diagnosis not present

## 2022-04-22 DIAGNOSIS — E669 Obesity, unspecified: Secondary | ICD-10-CM | POA: Diagnosis not present

## 2022-04-22 DIAGNOSIS — R29810 Facial weakness: Secondary | ICD-10-CM | POA: Diagnosis present

## 2022-04-22 DIAGNOSIS — G936 Cerebral edema: Secondary | ICD-10-CM | POA: Diagnosis present

## 2022-04-22 DIAGNOSIS — I110 Hypertensive heart disease with heart failure: Secondary | ICD-10-CM | POA: Diagnosis present

## 2022-04-22 DIAGNOSIS — Z825 Family history of asthma and other chronic lower respiratory diseases: Secondary | ICD-10-CM

## 2022-04-22 DIAGNOSIS — I10 Essential (primary) hypertension: Secondary | ICD-10-CM | POA: Diagnosis not present

## 2022-04-22 DIAGNOSIS — R200 Anesthesia of skin: Secondary | ICD-10-CM

## 2022-04-22 DIAGNOSIS — R4781 Slurred speech: Secondary | ICD-10-CM

## 2022-04-22 DIAGNOSIS — R471 Dysarthria and anarthria: Secondary | ICD-10-CM | POA: Diagnosis present

## 2022-04-22 DIAGNOSIS — Z823 Family history of stroke: Secondary | ICD-10-CM | POA: Diagnosis not present

## 2022-04-22 DIAGNOSIS — G4733 Obstructive sleep apnea (adult) (pediatric): Secondary | ICD-10-CM | POA: Diagnosis present

## 2022-04-22 DIAGNOSIS — R233 Spontaneous ecchymoses: Secondary | ICD-10-CM | POA: Diagnosis present

## 2022-04-22 DIAGNOSIS — E7800 Pure hypercholesterolemia, unspecified: Secondary | ICD-10-CM | POA: Diagnosis present

## 2022-04-22 DIAGNOSIS — T490X6A Underdosing of local antifungal, anti-infective and anti-inflammatory drugs, initial encounter: Secondary | ICD-10-CM | POA: Diagnosis present

## 2022-04-22 DIAGNOSIS — I4891 Unspecified atrial fibrillation: Secondary | ICD-10-CM | POA: Diagnosis present

## 2022-04-22 LAB — COMPREHENSIVE METABOLIC PANEL
ALT: 14 U/L (ref 0–44)
AST: 22 U/L (ref 15–41)
Albumin: 3.8 g/dL (ref 3.5–5.0)
Alkaline Phosphatase: 71 U/L (ref 38–126)
Anion gap: 11 (ref 5–15)
BUN: 8 mg/dL (ref 6–20)
CO2: 25 mmol/L (ref 22–32)
Calcium: 9.9 mg/dL (ref 8.9–10.3)
Chloride: 99 mmol/L (ref 98–111)
Creatinine, Ser: 0.83 mg/dL (ref 0.61–1.24)
GFR, Estimated: >60 mL/min (ref >60)
Glucose, Bld: 229 mg/dL — ABNORMAL HIGH (ref 70–99)
Potassium: 3.6 mmol/L (ref 3.5–5.1)
Sodium: 135 mmol/L (ref 135–145)
Total Bilirubin: 1.5 mg/dL — ABNORMAL HIGH (ref 0.3–1.2)
Total Protein: 7.3 g/dL (ref 6.5–8.1)

## 2022-04-22 LAB — CBC
HCT: 38.4 % — ABNORMAL LOW (ref 39.0–52.0)
Hemoglobin: 12.5 g/dL — ABNORMAL LOW (ref 13.0–17.0)
MCH: 26.7 pg (ref 26.0–34.0)
MCHC: 32.6 g/dL (ref 30.0–36.0)
MCV: 81.9 fL (ref 80.0–100.0)
Platelets: 330 10*3/uL (ref 150–400)
RBC: 4.69 MIL/uL (ref 4.22–5.81)
RDW: 12.7 % (ref 11.5–15.5)
WBC: 6.4 10*3/uL (ref 4.0–10.5)
nRBC: 0 % (ref 0.0–0.2)

## 2022-04-22 LAB — DIFFERENTIAL
Abs Immature Granulocytes: 0.03 10*3/uL (ref 0.00–0.07)
Basophils Absolute: 0 10*3/uL (ref 0.0–0.1)
Basophils Relative: 1 %
Eosinophils Absolute: 0.2 10*3/uL (ref 0.0–0.5)
Eosinophils Relative: 2 %
Immature Granulocytes: 1 %
Lymphocytes Relative: 41 %
Lymphs Abs: 2.6 10*3/uL (ref 0.7–4.0)
Monocytes Absolute: 0.4 10*3/uL (ref 0.1–1.0)
Monocytes Relative: 6 %
Neutro Abs: 3.2 10*3/uL (ref 1.7–7.7)
Neutrophils Relative %: 49 %

## 2022-04-22 LAB — ETHANOL: Alcohol, Ethyl (B): <10 mg/dL (ref <10)

## 2022-04-22 LAB — PROTIME-INR
INR: 1 (ref 0.8–1.2)
Prothrombin Time: 12.8 seconds (ref 11.4–15.2)

## 2022-04-22 LAB — CBG MONITORING, ED: Glucose-Capillary: 204 mg/dL — ABNORMAL HIGH (ref 70–99)

## 2022-04-22 LAB — APTT: aPTT: 25 seconds (ref 24–36)

## 2022-04-22 LAB — HIV ANTIBODY (ROUTINE TESTING W REFLEX): HIV Screen 4th Generation wRfx: NONREACTIVE

## 2022-04-22 MED ORDER — IRBESARTAN 300 MG PO TABS
300.0000 mg | ORAL_TABLET | Freq: Every day | ORAL | Status: DC
  Administered 2022-04-22 – 2022-04-23 (×2): 300 mg via ORAL
  Filled 2022-04-22 (×2): qty 1

## 2022-04-22 MED ORDER — NICOTINE POLACRILEX 2 MG MT GUM
2.0000 mg | CHEWING_GUM | OROMUCOSAL | Status: DC | PRN
  Filled 2022-04-22: qty 1

## 2022-04-22 MED ORDER — ROSUVASTATIN CALCIUM 20 MG PO TABS
40.0000 mg | ORAL_TABLET | Freq: Every day | ORAL | Status: DC
  Administered 2022-04-23: 40 mg via ORAL
  Filled 2022-04-22: qty 2

## 2022-04-22 MED ORDER — INSULIN GLARGINE-YFGN 100 UNIT/ML ~~LOC~~ SOLN
32.0000 [IU] | Freq: Every day | SUBCUTANEOUS | Status: DC
  Administered 2022-04-22 – 2022-04-23 (×2): 32 [IU] via SUBCUTANEOUS
  Filled 2022-04-22 (×3): qty 0.32

## 2022-04-22 MED ORDER — INSULIN ASPART 100 UNIT/ML IJ SOLN
10.0000 [IU] | Freq: Three times a day (TID) | INTRAMUSCULAR | Status: DC
  Administered 2022-04-22 – 2022-04-23 (×3): 10 [IU] via SUBCUTANEOUS

## 2022-04-22 MED ORDER — ACETAMINOPHEN 650 MG RE SUPP: 650.0000 mg | RECTAL | Status: DC | PRN

## 2022-04-22 MED ORDER — NICOTINE POLACRILEX 2 MG MT LOZG: 2.0000 mg | LOZENGE | OROMUCOSAL | Status: DC | PRN

## 2022-04-22 MED ORDER — ENOXAPARIN SODIUM 40 MG/0.4ML IJ SOSY
40.0000 mg | PREFILLED_SYRINGE | INTRAMUSCULAR | Status: DC
  Administered 2022-04-22: 40 mg via SUBCUTANEOUS
  Filled 2022-04-22: qty 0.4

## 2022-04-22 MED ORDER — INSULIN ASPART 100 UNIT/ML IJ SOLN
0.0000 [IU] | Freq: Three times a day (TID) | INTRAMUSCULAR | Status: DC
  Administered 2022-04-22 – 2022-04-23 (×2): 5 [IU] via SUBCUTANEOUS
  Administered 2022-04-23: 3 [IU] via SUBCUTANEOUS

## 2022-04-22 MED ORDER — ACETAMINOPHEN 325 MG PO TABS: 650.0000 mg | ORAL_TABLET | ORAL | Status: DC | PRN

## 2022-04-22 MED ORDER — INSULIN GLARGINE (1 UNIT DIAL) 300 UNIT/ML ~~LOC~~ SOPN: 40.0000 [IU] | PEN_INJECTOR | Freq: Every day | SUBCUTANEOUS | Status: DC

## 2022-04-22 MED ORDER — CARVEDILOL 12.5 MG PO TABS
12.5000 mg | ORAL_TABLET | Freq: Two times a day (BID) | ORAL | Status: DC
  Administered 2022-04-22 – 2022-04-23 (×2): 12.5 mg via ORAL
  Filled 2022-04-22 (×2): qty 1

## 2022-04-22 MED ORDER — SENNOSIDES-DOCUSATE SODIUM 8.6-50 MG PO TABS: 1.0000 | ORAL_TABLET | Freq: Every evening | ORAL | Status: DC | PRN

## 2022-04-22 MED ORDER — STROKE: EARLY STAGES OF RECOVERY BOOK
Freq: Once | Status: AC
  Filled 2022-04-22: qty 1

## 2022-04-22 MED ORDER — ACETAMINOPHEN 160 MG/5ML PO SOLN: 650.0000 mg | ORAL | Status: DC | PRN

## 2022-04-22 MED ORDER — VALSARTAN-HYDROCHLOROTHIAZIDE 320-25 MG PO TABS: 1.0000 | ORAL_TABLET | Freq: Every day | ORAL | Status: DC

## 2022-04-22 MED ORDER — DILTIAZEM HCL ER COATED BEADS 180 MG PO CP24
300.0000 mg | ORAL_CAPSULE | Freq: Every day | ORAL | Status: DC
  Administered 2022-04-23: 300 mg via ORAL
  Filled 2022-04-22: qty 1

## 2022-04-22 MED ORDER — HYDROCHLOROTHIAZIDE 25 MG PO TABS
25.0000 mg | ORAL_TABLET | Freq: Every day | ORAL | Status: DC
  Administered 2022-04-22 – 2022-04-23 (×2): 25 mg via ORAL
  Filled 2022-04-22 (×2): qty 1

## 2022-04-22 MED ORDER — IOHEXOL 350 MG/ML SOLN
75.0000 mL | Freq: Once | INTRAVENOUS | Status: AC | PRN
  Administered 2022-04-22: 75 mL via INTRAVENOUS

## 2022-04-22 NOTE — ED Notes (Signed)
ED TO INPATIENT HANDOFF REPORT  ED Nurse Name and Phone #:  Theophilus Bones [phone]  S Name/Age/Gender Jesse Duncan 37 y.o. male Room/Bed: 038C/038C  Code Status   Code Status: Full Code  Home/SNF/Other Home Patient oriented to: self, place, time, and situation Is this baseline? Yes   Triage Complete: Triage complete  Chief Complaint Stroke [I63.9]  Triage Note Pt c/o numbness to right hand, tingling in his fingers to right hand, slurred speech since Monday, pt reports he saw PCP Tuesday and was scheduled for MRI this am, after MRI they told him to come to ED for "stroke symptoms"   Allergies No Known Allergies  Level of Care/Admitting Diagnosis ED Disposition     ED Disposition  Admit   Condition  --   Comment  Hospital Area: [facility] [[id number]]  Level of Care: Telemetry Medical [104]  May admit patient to [facility] or [facility] if equivalent level of care is available:: No  Covid Evaluation: Asymptomatic - no recent exposure (last 10 days) testing not required  Diagnosis: Stroke [130865]  Admitting Physician: [facility] [[id number]]  Attending Physician: [facility] [[id number]]  Certification:: I certify this patient will need inpatient services for at least 2 midnights  Estimated Length of Stay: 2          B Medical/Surgery History Past Medical History:  Diagnosis Date   Allergic rhinitis due to pollen 07/06/2010   Atrial fibrillation 08/15/2015   Diabetes mellitus without complication    Dyslipidemia    Hyperlipidemia 05/25/2010   Hypertension    Impaired fasting blood sugar 08/15/2015   Obesity    OSA (obstructive sleep apnea) 10/20/2015   Prediabetes    Sleep apnea    Smoker    Tobacco use disorder 11/14/2013   History reviewed. No pertinent surgical history.   A IV Location/Drains/Wounds Patient Lines/Drains/Airways Status     Active Line/Drains/Airways     Name Placement date Placement time Site Days   Peripheral  IV 04/22/22 18 G Anterior;Distal;Left;Upper Arm 04/22/22  1220  Arm  less than 1            Intake/Output Last 24 hours No intake or output data in the 24 hours ending 04/22/22 1524  Labs/Imaging Results for orders placed or performed during the hospital encounter of 04/22/22 (from the past 48 hour(s))  Ethanol     Status: None   Collection Time: 04/22/22 11:20 AM  Result Value Ref Range   Alcohol, Ethyl (B) <10 <10 mg/dL    Comment: (NOTE) Lowest detectable limit for serum alcohol is 10 mg/dL.  For medical purposes only. Performed at [facility], [address], [place], [place] [address]   Protime-INR     Status: None   Collection Time: 04/22/22 11:20 AM  Result Value Ref Range   Prothrombin Time 12.8 11.4 - 15.2 seconds   INR 1.0 0.8 - 1.2    Comment: (NOTE) INR goal varies based on device and disease states. Performed at [facility], [address] [place], [place] [address]   APTT     Status: None   Collection Time: 04/22/22 11:20 AM  Result Value Ref Range   aPTT 25 24 - 36 seconds    Comment: Performed at [facility], [address] [place], [place] [address]  CBC     Status: Abnormal   Collection Time: 04/22/22 11:20 AM  Result Value Ref Range   WBC 6.4 4.0 -  10.5 K/uL   RBC 4.69 4.22 - 5.81 MIL/uL   Hemoglobin 12.5 (L) 13.0 - 17.0 g/dL   HCT 16.1 (L) 09.6 - 04.5 %   MCV 81.9 80.0 - 100.0 fL   MCH 26.7 26.0 - 34.0 pg   MCHC 32.6 30.0 - 36.0 g/dL   RDW 40.9 81.1 - 91.4 %   Platelets 330 150 - 400 K/uL   nRBC 0.0 0.0 - 0.2 %    Comment: Performed at [facility], [address] [place], [place] [address]  Differential     Status: None   Collection Time: 04/22/22 11:20 AM  Result Value Ref Range   Neutrophils Relative % 49 %   Neutro Abs 3.2 1.7 - 7.7 K/uL   Lymphocytes Relative 41 %   Lymphs Abs 2.6 0.7 - 4.0 K/uL   Monocytes Relative 6 %   Monocytes Absolute 0.4 0.1 - 1.0 K/uL   Eosinophils Relative 2 %   Eosinophils  Absolute 0.2 0.0 - 0.5 K/uL   Basophils Relative 1 %   Basophils Absolute 0.0 0.0 - 0.1 K/uL   Immature Granulocytes 1 %   Abs Immature Granulocytes 0.03 0.00 - 0.07 K/uL    Comment: Performed at [facility], [address] [place], [place] [address]  Comprehensive metabolic panel     Status: Abnormal   Collection Time: 04/22/22 11:20 AM  Result Value Ref Range   Sodium 135 135 - 145 mmol/L   Potassium 3.6 3.5 - 5.1 mmol/L   Chloride 99 98 - 111 mmol/L   CO2 25 22 - 32 mmol/L   Glucose, Bld 229 (H) 70 - 99 mg/dL    Comment: Glucose reference range applies only to samples taken after fasting for at least 8 hours.   BUN 8 6 - 20 mg/dL   Creatinine, Ser 8.65 0.61 - 1.24 mg/dL   Calcium 9.9 8.9 - 78.4 mg/dL   Total Protein 7.3 6.5 - 8.1 g/dL   Albumin 3.8 3.5 - 5.0 g/dL   AST 22 15 - 41 U/L   ALT 14 0 - 44 U/L   Alkaline Phosphatase 71 38 - 126 U/L   Total Bilirubin 1.5 (H) 0.3 - 1.2 mg/dL   GFR, Estimated >69 >62 mL/min    Comment: (NOTE) Calculated using the CKD-EPI Creatinine Equation (2021)    Anion gap 11 5 - 15    Comment: Performed at [facility], [address], [place], [place] [address]   CT [facility] HEAD NECK W WO CM  Addendum Date: 04/22/2022   ADDENDUM REPORT: 04/22/2022 14:08 ADDENDUM: Findings discussed with Dr. Dalene Seltzer via telephone at 2 p.m. Electronically Signed   By: Feliberto Harts M.D.   On: 04/22/2022 14:08   Result Date: 04/22/2022 CLINICAL DATA:  Neuro deficit, acute, stroke suspected EXAM: CT ANGIOGRAPHY HEAD AND NECK WITH AND WITHOUT CONTRAST TECHNIQUE: Multidetector CT imaging of the head and neck was performed using the standard protocol during bolus administration of intravenous contrast. Multiplanar CT image reconstructions and MIPs were obtained to evaluate the vascular anatomy. Carotid stenosis measurements (when applicable) are obtained utilizing NASCET criteria, using the distal internal carotid diameter as the denominator. RADIATION  DOSE REDUCTION: This exam was performed according to the departmental dose-optimization program which includes automated exposure control, adjustment of the mA and/or kV according to patient size and/or use of iterative reconstruction technique. CONTRAST:  75mL OMNIPAQUE IOHEXOL 350 MG/ML SOLN COMPARISON:  MRI head same day. FINDINGS: CT HEAD  FINDINGS Brain: Left MCA and PCA territory infarcts, better characterized on same day MRI. No mass occupying acute hemorrhage or midline shift. No hydrocephalus. No mass lesion or extra-axial fluid collection. Vascular: See below. Skull: No acute fracture. Sinuses/Orbits: Largely clear sinuses.  No acute orbital findings. Other: No mastoid effusions. Review of the MIP images confirms the above findings CTA NECK FINDINGS Aortic arch: Great vessel origins are patent without significant stenosis. Right carotid system: No evidence of dissection, stenosis (50% or greater), or occlusion. Left carotid system: No evidence of dissection, stenosis (50% or greater), or occlusion. Vertebral arteries: Codominant. No evidence of dissection, stenosis (50% or greater), or occlusion. Skeleton: No acute fracture on limited assessment. Other neck: No acute abnormality on limited assessment. Upper chest: Visualized lung apices are clear. Review of the MIP images confirms the above findings CTA HEAD FINDINGS Anterior circulation: Bilateral intracranial ICAs are patent. Severe stenosis of proximal left M2 MCA stenosis. Moderate right M1 MCA stenosis. Posterior circulation: Severe stenosis of the proximal intradural vertebral artery, likely due to atherosclerosis. The basilar artery and right posterior cerebral artery are patent. Occlusion of the distal left P2 PCA. Venous sinuses: As permitted by contrast timing, patent. Review of the MIP images confirms the above findings IMPRESSION: 1. Occlusion of the distal left P2 PCA. 2. Severe left M2 MCA stenosis. 3. Severe proximal left intradural vertebral  artery stenosis. 4. Moderate right M1 MCA stenosis. Electronically Signed: By: Feliberto Harts M.D. On: 04/22/2022 13:55   MR Brain Wo Contrast  Result Date: 04/22/2022 CLINICAL DATA:  37 year old male "slurred speech, numbness and tingling, right facial numbness". Symptom onset reportedly began last week. History of hypertension and diabetes. EXAM: MRI HEAD WITHOUT CONTRAST TECHNIQUE: Multiplanar, multiecho pulse sequences of the brain and surrounding structures were obtained without intravenous contrast. COMPARISON:  No prior brain imaging. FINDINGS: Brain: Relatively large, roughly 5 cm area of confluent restricted diffusion in the left occipital pole, affecting the inferior left parietal lobe (left PCA territory). Left thalamus and temporal lobe appear relatively spared, but there is a roughly 1 cm area of diffusion restriction also in the splenium of the corpus callosum. Furthermore, there is similar patchy, scattered gyral and subcortical white matter diffusion restriction in the left middle frontal gyrus (middle division left MCA territory). Some of the left insula and inferior frontal gyrus also affected. No restricted diffusion identified in the right hemisphere or the posterior fossa. T2 hyperintense cytotoxic edema in the affected areas. Mild petechial hemorrhage in the left occipital pole on SWI (series 11, image 31). No other convincing blood products. No superimposed midline shift, ventriculomegaly, extra-axial collection. Left hemisphere and posterior fossa gray and white matter signal appears to remain normal. Cervicomedullary junction and pituitary are within normal limits. Vascular: Major intracranial vascular flow voids are preserved. Skull and upper cervical spine: Negative visible cervical spine. Visualized bone marrow signal is within normal limits. Sinuses/Orbits: Rightward gaze. Otherwise negative orbits soft tissues. Scattered paranasal sinus mucosal thickening is mild. Other: Mastoids  appear clear. Grossly normal visible internal auditory structures. Negative visible scalp and face. IMPRESSION: 1. Positive for acute to early subacute large or medium-sized vessel type infarcts in both the Left MCA and Left PCA territories. Cytotoxic edema with minor petechial hemorrhage in the left occipital lobe. No malignant hemorrhagic transformation or intracranial mass effect. No ischemia in the right hemisphere or posterior fossa, which argues against a recent cardiac embolic event. 2. Study discussed by telephone with Dr. Sanda Linger on 04/22/2022 at 1012 hours.  And we are arranging for the patient to present to the [facility] Emergency Department for additional Stroke care and workup. Electronically Signed   By: Odessa Fleming M.D.   On: 04/22/2022 10:23    Pending Labs Unresulted Labs (From admission, onward)     Start     Ordered   04/23/22 0500  Lipid panel  (Labs)  Tomorrow morning,   R       Comments: Fasting    04/22/22 1306   04/22/22 1305  HIV Antibody (routine testing w rflx)  (HIV Antibody (Routine testing w reflex) panel)  Once,   R        04/22/22 1306   04/22/22 1109  Urine rapid drug screen (hosp performed)  Once,   STAT        04/22/22 1109   04/22/22 1109  Urinalysis, Routine w reflex microscopic -Urine, Clean Catch  Once,   URGENT       Question:  Specimen Source  Answer:  Urine, Clean Catch   04/22/22 1109            Vitals/Pain Today's Vitals   04/22/22 1230 04/22/22 1300 04/22/22 1440 04/22/22 1519  BP: (!) 157/88  (!) 145/81   Pulse: 83 65 60   Resp: 19 17 13    Temp:    97.8 F (36.6 C)  TempSrc:    Oral  SpO2: 100% 100% 100%   Weight:      Height:      PainSc:        Isolation Precautions No active isolations  Medications Medications  carvedilol (COREG) tablet 12.5 mg (has no administration in time range)  diltiazem (CARDIZEM CD) 24 hr capsule 300 mg (has no administration in time range)  rosuvastatin (CRESTOR) tablet 40 mg (has no  administration in time range)  insulin aspart (novoLOG) injection 10 Units (has no administration in time range)   stroke: early stages of recovery book (has no administration in time range)  acetaminophen (TYLENOL) tablet 650 mg (has no administration in time range)    Or  acetaminophen (TYLENOL) 160 MG/5ML solution 650 mg (has no administration in time range)    Or  acetaminophen (TYLENOL) suppository 650 mg (has no administration in time range)  senna-docusate (Senokot-S) tablet 1 tablet (has no administration in time range)  enoxaparin (LOVENOX) injection 40 mg (has no administration in time range)  insulin aspart (novoLOG) injection 0-15 Units (has no administration in time range)  irbesartan (AVAPRO) tablet 300 mg (300 mg Oral Given 04/22/22 1438)  hydrochlorothiazide (HYDRODIURIL) tablet 25 mg (25 mg Oral Given 04/22/22 1439)  nicotine polacrilex (NICORETTE) gum 2 mg (has no administration in time range)  insulin glargine-yfgn (SEMGLEE) injection 32 Units (has no administration in time range)  iohexol (OMNIPAQUE) 350 MG/ML injection 75 mL (75 mLs Intravenous Contrast Given 04/22/22 1253)    Mobility walks     Focused Assessments Neuro Assessment Handoff:  Swallow screen pass? Yes          Neuro Assessment: Within Defined Limits Neuro Checks:      Has TPA been given? No If patient is a Neuro Trauma and patient is going to OR before floor call report to 4N Charge nurse: [phone] or [phone]   R Recommendations: See Admitting Provider Note  Report given to:   Additional Notes:

## 2022-04-22 NOTE — H&P (Signed)
History and Physical    Jesse Duncan NWG:[id number] DOB: 01-07-1985 DOA: 04/22/2022  PCP: [name], MD (Confirm with patient/family/NH records and if not entered, this has to be entered at [facility] point of entry) Patient coming from: Home  I have personally briefly reviewed patient's old medical records in [facility]  Chief Complaint: Right arm weakness numbness and speech problem  HPI: Jesse Duncan is a 37 y.o. male with medical history significant of PAF, HTN, chronic combined HFrEF and HFpEF, HLD, IDDM, cigarette smoking, presented with new onset of right arm weakness numbness and slurred speech.  Symptoms started Monday, which include new onset of right arm weakness numbness and slurred speech.  Initially patient felt might be a pressure palsy and did not take it seriously.  Symptoms persisted through Tuesday and Wednesday.  Denies any weakness numbness of the lower extremities, no headache no vision problems no double vision.  Patient was diagnosed with PAF 6 to 7 years ago, used to be on aspirin but no longer taking and he cannot recall the reason behind.  Denies any recent feeling of palpitations.  Most recent A1c= 14.  And patient smokes cigarette 1 pack a day.  ED Course: Blood pressure elevated 150/100, afebrile nonhypoxic.  MRI showed early subacute large and medium sized vessel time infarct in both left MCA and left PCA cytotoxic edema with minor petechial hemorrhage in the left occipital lobe  Review of Systems: As per HPI otherwise 14 point review of systems negative.    Past Medical History:  Diagnosis Date   Allergic rhinitis due to pollen 07/06/2010   Atrial fibrillation 08/15/2015   Diabetes mellitus without complication    Dyslipidemia    Hyperlipidemia 05/25/2010   Hypertension    Impaired fasting blood sugar 08/15/2015   Obesity    OSA (obstructive sleep apnea) 10/20/2015   Prediabetes    Sleep apnea    Smoker    Tobacco use disorder 11/14/2013     History reviewed. No pertinent surgical history.   reports that he has been smoking cigars. He has been exposed to tobacco smoke. He has never used smokeless tobacco. He reports current alcohol use. He reports that he does not use drugs.  No Known Allergies  Family History  Problem Relation Age of Onset   Hypertension Mother    Healthy Father    COPD Father    Stroke Maternal Grandmother    Cancer Maternal Grandfather    Heart disease Neg Hx      Prior to Admission medications   Medication Sig Start Date End Date Taking? Authorizing Provider  carvedilol (COREG) 12.5 MG tablet Take 1 tablet (12.5 mg total) by mouth 2 (two) times daily with a meal. 09/28/21   Storm Frisk, MD  diltiazem (CARDIZEM CD) 300 MG 24 hr capsule TAKE 1 CAPSULE BY MOUTH EVERY DAY 09/28/21   Storm Frisk, MD  glucose blood test strip Use as instructed 09/28/21   Storm Frisk, MD  insulin aspart (FIASP) 100 UNIT/ML FlexTouch Pen Inject 10 Units into the skin 3 (three) times daily with meals. 04/10/22   [name], MD  insulin glargine, 1 Unit Dial, (TOUJEO SOLOSTAR) 300 UNIT/ML Solostar Pen Inject 40 Units into the skin daily. 04/10/22   [name], MD  Insulin Pen Needle (BD PEN NEEDLE NANO U/F) 32G X 4 MM MISC 1 Act by Subdermal route in the morning, at noon, in the evening, and at bedtime. Use to inject insulin  once daily 04/10/22   [name], MD  metFORMIN (GLUCOPHAGE) 1000 MG tablet Take 1 tablet (1,000 mg total) by mouth 2 (two) times daily with a meal. 04/10/22   [name], MD  nicotine polacrilex (NICORETTE MINI) 4 MG lozenge Use 4mg  three times a day to stop smoking 09/28/21   Storm Frisk, MD  OneTouch UltraSoft 2 Lancets MISC 1 Units by Does not apply route 3 (three) times daily. Before meals 09/28/21   Storm Frisk, MD  rosuvastatin (CRESTOR) 40 MG tablet Take 1 tablet (40 mg total) by mouth daily. 04/10/22 10/07/22  [name], MD  Semaglutide (RYBELSUS) 7 MG  TABS Take 7 mg by mouth daily. 09/28/21   Storm Frisk, MD  valsartan-hydrochlorothiazide (DIOVAN-HCT) 320-25 MG tablet Take 1 tablet by mouth daily. 09/28/21   Storm Frisk, MD    Physical Exam: Vitals:   04/22/22 1045 04/22/22 1102  BP: (!) 154/100   Pulse: 76   Resp: 16   Temp: 98.7 F (37.1 C)   TempSrc: Oral   SpO2: 100%   Weight:  122.9 kg  Height:  6\' 1"  (1.854 m)    Constitutional: NAD, calm, comfortable Vitals:   04/22/22 1045 04/22/22 1102  BP: (!) 154/100   Pulse: 76   Resp: 16   Temp: 98.7 F (37.1 C)   TempSrc: Oral   SpO2: 100%   Weight:  122.9 kg  Height:  6\' 1"  (1.854 m)   Eyes: PERRL, lids and conjunctivae normal ENMT: Mucous membranes are moist. Posterior pharynx clear of any exudate or lesions.Normal dentition.  Neck: normal, supple, no masses, no thyromegaly Respiratory: clear to auscultation bilaterally, no wheezing, no crackles. Normal respiratory effort. No accessory muscle use.  Cardiovascular: Regular rate and rhythm, no murmurs / rubs / gallops. No extremity edema. 2+ pedal pulses. No carotid bruits.  Abdomen: no tenderness, no masses palpated. No hepatosplenomegaly. Bowel sounds positive.  Musculoskeletal: no clubbing / cyanosis. No joint deformity upper and lower extremities. Good ROM, no contractures. Normal muscle tone.  Skin: no rashes, lesions, ulcers. No induration Neurologic: CN 2-12 grossly intact. Sensation intact, DTR normal.  Right forearm and hand strength 4/5 compared to 5/5 on the left side.  Both lower extremities muscle strength 5/5.  Psychiatric: Normal judgment and insight. Alert and oriented x 3. Normal mood.     Labs on Admission: I have personally reviewed following labs and imaging studies  CBC: Recent Labs  Lab 04/22/22 1120  WBC 6.4  NEUTROABS 3.2  HGB 12.5*  HCT 38.4*  MCV 81.9  PLT 330   Basic Metabolic Panel: Recent Labs  Lab 04/20/22 1150 04/22/22 1120  NA 135 135  K 3.9 3.6  CL 99 99  CO2  30 25  GLUCOSE 266* 229*  BUN 12 8  CREATININE 0.89 0.83  CALCIUM 9.5 9.9   GFR: Estimated Creatinine Clearance: 170.6 mL/min (by C-G formula based on SCr of 0.83 mg/dL). Liver Function Tests: Recent Labs  Lab 04/22/22 1120  AST 22  ALT 14  ALKPHOS 71  BILITOT 1.5*  PROT 7.3  ALBUMIN 3.8   No results for input(s): "LIPASE", "AMYLASE" in the last 168 hours. No results for input(s): "AMMONIA" in the last 168 hours. Coagulation Profile: Recent Labs  Lab 04/22/22 1120  INR 1.0   Cardiac Enzymes: No results for input(s): "CKTOTAL", "CKMB", "CKMBINDEX", "TROPONINI" in the last 168 hours. BNP (last 3 results) Recent Labs    04/08/22 1533  PROBNP  30.0   HbA1C: No results for input(s): "HGBA1C" in the last 72 hours. CBG: No results for input(s): "GLUCAP" in the last 168 hours. Lipid Profile: No results for input(s): "CHOL", "HDL", "LDLCALC", "TRIG", "CHOLHDL", "LDLDIRECT" in the last 72 hours. Thyroid Function Tests: No results for input(s): "TSH", "T4TOTAL", "FREET4", "T3FREE", "THYROIDAB" in the last 72 hours. Anemia Panel: No results for input(s): "VITAMINB12", "FOLATE", "FERRITIN", "TIBC", "IRON", "RETICCTPCT" in the last 72 hours. Urine analysis:    Component Value Date/Time   COLORURINE YELLOW 04/08/2022 1824   APPEARANCEUR CLEAR 04/08/2022 1824   LABSPEC 1.030 04/08/2022 1824   PHURINE 5.0 04/08/2022 1824   GLUCOSEU >=500 (A) 04/08/2022 1824   GLUCOSEU >=1000 (A) 04/08/2022 1533   HGBUR NEGATIVE 04/08/2022 1824   BILIRUBINUR NEGATIVE 04/08/2022 1824   BILIRUBINUR n 12/23/2014 1218   KETONESUR NEGATIVE 04/08/2022 1824   PROTEINUR NEGATIVE 04/08/2022 1824   UROBILINOGEN 1.0 04/08/2022 1533   NITRITE NEGATIVE 04/08/2022 1824   LEUKOCYTESUR NEGATIVE 04/08/2022 1824    Radiological Exams on Admission: MR Brain Wo Contrast  Result Date: 04/22/2022 CLINICAL DATA:  37 year old male "slurred speech, numbness and tingling, right facial numbness". Symptom onset  reportedly began last week. History of hypertension and diabetes. EXAM: MRI HEAD WITHOUT CONTRAST TECHNIQUE: Multiplanar, multiecho pulse sequences of the brain and surrounding structures were obtained without intravenous contrast. COMPARISON:  No prior brain imaging. FINDINGS: Brain: Relatively large, roughly 5 cm area of confluent restricted diffusion in the left occipital pole, affecting the inferior left parietal lobe (left PCA territory). Left thalamus and temporal lobe appear relatively spared, but there is a roughly 1 cm area of diffusion restriction also in the splenium of the corpus callosum. Furthermore, there is similar patchy, scattered gyral and subcortical white matter diffusion restriction in the left middle frontal gyrus (middle division left MCA territory). Some of the left insula and inferior frontal gyrus also affected. No restricted diffusion identified in the right hemisphere or the posterior fossa. T2 hyperintense cytotoxic edema in the affected areas. Mild petechial hemorrhage in the left occipital pole on SWI (series 11, image 31). No other convincing blood products. No superimposed midline shift, ventriculomegaly, extra-axial collection. Left hemisphere and posterior fossa gray and white matter signal appears to remain normal. Cervicomedullary junction and pituitary are within normal limits. Vascular: Major intracranial vascular flow voids are preserved. Skull and upper cervical spine: Negative visible cervical spine. Visualized bone marrow signal is within normal limits. Sinuses/Orbits: Rightward gaze. Otherwise negative orbits soft tissues. Scattered paranasal sinus mucosal thickening is mild. Other: Mastoids appear clear. Grossly normal visible internal auditory structures. Negative visible scalp and face. IMPRESSION: 1. Positive for acute to early subacute large or medium-sized vessel type infarcts in both the Left MCA and Left PCA territories. Cytotoxic edema with minor petechial  hemorrhage in the left occipital lobe. No malignant hemorrhagic transformation or intracranial mass effect. No ischemia in the right hemisphere or posterior fossa, which argues against a recent cardiac embolic event. 2. Study discussed by telephone with Dr. Sanda Linger on 04/22/2022 at 1012 hours. And we are arranging for the patient to present to the [facility] Emergency Department for additional Stroke care and workup. Electronically Signed   By: Odessa Fleming M.D.   On: 04/22/2022 10:23    EKG: Independently reviewed.  Sinus, chronic T wave inversions on V4-V6.  Assessment/Plan Principal Problem:   Stroke Active Problems:   Atrial fibrillation   Hyperlipidemia associated with type 2 diabetes mellitus   Tobacco use  (please  populate well all problems here in Problem List. (For example, if patient is on BP meds at home and you resume or decide to hold them, it is a problem that needs to be her. Same for CAD, COPD, HLD and so on)  Subacute stroke -With right upper extremity paresis, and dysarthria -Suspected etiology, embolic, risk factor including A-fib not on antiplatelet/anticoagulation, HTN, diabetes, poorly controlled versus noncompliance -Past 48 hours time window for permissive HTN, will resume home BP meds -Hold ASA as there is some signs of petechia changes within the stroke on MRI, will discuss with neurology regarding long-term anticoagulation options.  CHADS2=5 now. -Check UDS, patient denied cocaine use  PAF -In sinus rhythm -Telemonitoring x 24 hours -Anticoagulation as discussed above  HTN -Resume home BP meds including Cardizem, Coreg, Diovan  IDDM, with hyperglycemia -Resume Lantus 40 units daily, Humalog 10 units 3 times daily AC, add SSI -Hold off metformin as patient received IV contrast today for CTA.  Chronic combined HFrEF and HFpEF -Euvolemic, continue home BP meds -Echo  Cigarette smoking -Cessation education performed at bedside -Continue nicotine  patch.  DVT prophylaxis: SCD Code Status: Full code Family Communication: Wife at bedside Disposition Plan: Patient sick with stroke with significant neuro deficit, requiring inpatient stroke workup, inpatient PT and speech evaluation, expect more than 2 midnight hospital stay. Consults called: Neurology Admission status: Tele admit   Emeline General MD [facility] Pager [phone]  04/22/2022, 1:12 PM

## 2022-04-22 NOTE — ED Notes (Signed)
Got patient into a gown on the monitor patient is resting with call bell in reach 

## 2022-04-22 NOTE — ED Triage Notes (Addendum)
Pt c/o numbness to right hand, tingling in his fingers to right hand, slurred speech since Monday, pt reports he saw PCP Tuesday and was scheduled for MRI this am, after MRI they told him to come to ED for "stroke symptoms"

## 2022-04-22 NOTE — Progress Notes (Signed)
Pt arrived to unit. Vitals stable. Notified provider.  

## 2022-04-22 NOTE — ED Notes (Signed)
Patient transported to CT 

## 2022-04-22 NOTE — ED Provider Triage Note (Signed)
Emergency Medicine Provider Triage Evaluation Note  Karlis Cregg , a 37 y.o. male  was evaluated in triage.  Pt presents from MRI, he had an outpatient MRI of the brain today for evaluation of slurred speech right facial and right hand numbness and weakness.  MRI positive for left MCA and PCA infarcts.  Patient without prior history of stroke, does have underlying history of hypertension and diabetes.  Review of Systems  Positive: Slurred speech, right-sided numbness and weakness Negative: Headache  Physical Exam  BP (!) 154/100 (BP Location: Right Arm)   Pulse 76   Temp 98.7 F (37.1 C) (Oral)   Resp 16   Ht  (1.854 m)   Wt 122.9 kg   SpO2 100%   BMI 35.75 kg/m  Gen:   Awake, no distress  Resp:  Normal effort  MSK:   Moves extremities without difficulty  Other:  Speech is currently clear although slightly delayed, slight weakness of the right upper extremity and patient reports decreased sensation to the right upper extremity and right side of the face  Medical Decision Making  Medically screening exam initiated at 11:23 AM.  Appropriate orders placed.  Kyland No was informed that the remainder of the evaluation will be completed by another provider, this initial triage assessment does not replace that evaluation, and the importance of remaining in the ED until their evaluation is complete.  Outpatient MRI completed this morning positive for stroke with no prior history.  Stroke labs ordered, secure chat message sent to Dr. Otelia Limes with neurology   Dartha Lodge, [place] 04/22/22 1126

## 2022-04-22 NOTE — Consult Note (Signed)
NEURO HOSPITALIST CONSULT NOTE   Requestig physician: Dr. Dalene Seltzer  Reason for Consult: New onset of slurred speech with RUE weakness and numbness.   History obtained from:  Patient and Chart     HPI:                                                                                                                                          Jesse Duncan is an 37 y.o. male with a PMHx of atrial fibrillation subsequently resolved with cardiac procedure, DM, HLD, obesity, OSA and tobacco use disorder who presented to the ED this morning with a chief complaint of numbness and tingling of his right hand, right hand weakness, RLE weakness and slurred speech since Monday. He saw his PCP Tuesday and was scheduled for MRI this AM. After MRI they told him to come to ED for a stroke evaluation. LKN Sunday. Of note, he states that he had new onset of blurring of the peripheral vision of his right eye about one month ago. He saw his Optometrist who advised him to be medically worked up for this. The patient was diagnosed with PAF 6 to 7 years ago; he used to be on aspirin but is no longer taking this medication and cannot recall the reason why.  Past Medical History:  Diagnosis Date   Allergic rhinitis due to pollen 07/06/2010   Atrial fibrillation 08/15/2015   Diabetes mellitus without complication    Dyslipidemia    Hyperlipidemia 05/25/2010   Hypertension    Impaired fasting blood sugar 08/15/2015   Obesity    OSA (obstructive sleep apnea) 10/20/2015   Prediabetes    Sleep apnea    Smoker    Tobacco use disorder 11/14/2013    History reviewed. No pertinent surgical history.  Family History  Problem Relation Age of Onset   Hypertension Mother    Healthy Father    COPD Father    Stroke Maternal Grandmother    Cancer Maternal Grandfather    Heart disease Neg Hx            Social History:  reports that he has been smoking cigars. He has been exposed to tobacco smoke. He  has never used smokeless tobacco. He reports current alcohol use. He reports that he does not use drugs.  No Known Allergies  MEDICATIONS:  Prior to Admission:  Medications Prior to Admission  Medication Sig Dispense Refill Last Dose   diltiazem (CARDIZEM CD) 300 MG 24 hr capsule TAKE 1 CAPSULE BY MOUTH EVERY DAY 90 capsule 1 04/22/2022   glucose blood test strip Use as instructed 100 each 12 04/22/2022   insulin aspart (FIASP) 100 UNIT/ML FlexTouch Pen Inject 10 Units into the skin 3 (three) times daily with meals. 15 mL 1 04/21/2022   insulin glargine, 1 Unit Dial, (TOUJEO SOLOSTAR) 300 UNIT/ML Solostar Pen Inject 40 Units into the skin daily. 45 mL 3 04/21/2022   Insulin Pen Needle (BD PEN NEEDLE NANO U/F) 32G X 4 MM MISC 1 Act by Subdermal route in the morning, at noon, in the evening, and at bedtime. Use to inject insulin once daily 400 each 1 04/22/2022   metFORMIN (GLUCOPHAGE) 1000 MG tablet Take 1 tablet (1,000 mg total) by mouth 2 (two) times daily with a meal. 180 tablet 0 04/22/2022   OneTouch UltraSoft 2 Lancets MISC 1 Units by Does not apply route 3 (three) times daily. Before meals 100 each 4 04/22/2022   rosuvastatin (CRESTOR) 40 MG tablet Take 1 tablet (40 mg total) by mouth daily. 90 tablet 1 04/22/2022   Semaglutide (RYBELSUS) 7 MG TABS Take 7 mg by mouth daily. 60 tablet 2 04/22/2022   carvedilol (COREG) 12.5 MG tablet Take 1 tablet (12.5 mg total) by mouth 2 (two) times daily with a meal. (Patient not taking: Reported on 04/22/2022) 60 tablet 3 Not Taking   nicotine polacrilex (NICORETTE MINI) 4 MG lozenge Use 4mg  three times a day to stop smoking (Patient not taking: Reported on 04/22/2022) 100 tablet 4 Not Taking   valsartan-hydrochlorothiazide (DIOVAN-HCT) 320-25 MG tablet Take 1 tablet by mouth daily. (Patient not taking: Reported on 04/22/2022) 90 tablet 3 Not Taking    Scheduled:  [START ON 04/23/2022]  stroke: early stages of recovery book   Does not apply Once   carvedilol  12.5 mg Oral BID WC   [START ON 04/23/2022] diltiazem  300 mg Oral Daily   enoxaparin (LOVENOX) injection  40 mg Subcutaneous Q24H   hydrochlorothiazide  25 mg Oral Daily   insulin aspart  0-15 Units Subcutaneous TID WC   insulin aspart  10 Units Subcutaneous TID WC   insulin glargine-yfgn  32 Units Subcutaneous Daily   irbesartan  300 mg Oral Daily   [START ON 04/23/2022] rosuvastatin  40 mg Oral Daily      ROS:                                                                                                                                       As per HPI. Does not endorse any additional symptoms at the time of Neurology evaluation.    Blood pressure (!) 157/88, pulse 65, temperature 98.7 F (37.1 C), temperature source Oral, resp. rate 17, height 6\' 1"  (1.854 m), weight  122.9 kg, SpO2 100 %.   General Examination:                                                                                                       Physical Exam  HEENT-  [place]/AT   Lungs- Respirations unlabored Extremities- No edema   Neurological Examination Mental Status: Awake, alert and oriented. Good insight. Speech is subtly dysarthric but otherwise fluent with intact comprehension and no naming deficits.  Cranial Nerves: II: OD: Right hemifield defect upper and lower quadrants.  OS: Crescentic nasal visual field defect in upper and lower quadrants.  PERRL.   III,IV, VI: EOMI. No ptosis. No nystagmus.  V: Temp sensation equal bilaterally VII: Subtle delay of perioral muscle contraction on the right when smiling.  VIII: Hearing intact to voice IX,X: No hypophonia or hoarseness XI: Head is midline XII: Midline tongue extension Motor: RUE 4/5 proximally and distally RLE 4+/5  LUE and LLE 5/5 Negative Barre Positive orbiting fingers test on the right Sensory: Temp and light touch intact  throughout, bilaterally with no asymmetry subjectively. No extinction to DSS.  Deep Tendon Reflexes: 2+ and symmetric throughout Plantars: Right: downgoing   Left: downgoing Cerebellar: Mild dysmetria with FNF on the right. Left FNF normal Gait: Deferred   Lab Results: Basic Metabolic Panel: Recent Labs  Lab 04/20/22 1150 04/22/22 1120  NA 135 135  K 3.9 3.6  CL 99 99  CO2 30 25  GLUCOSE 266* 229*  BUN 12 8  CREATININE 0.89 0.83  CALCIUM 9.5 9.9    CBC: Recent Labs  Lab 04/22/22 1120  WBC 6.4  NEUTROABS 3.2  HGB 12.5*  HCT 38.4*  MCV 81.9  PLT 330    Cardiac Enzymes: No results for input(s): "CKTOTAL", "CKMB", "CKMBINDEX", "TROPONINI" in the last 168 hours.  Lipid Panel: No results for input(s): "CHOL", "TRIG", "HDL", "CHOLHDL", "VLDL", "LDLCALC" in the last 168 hours.  Imaging: CT ANGIO HEAD NECK W WO CM  Addendum Date: 04/22/2022   ADDENDUM REPORT: 04/22/2022 14:08 ADDENDUM: Findings discussed with Dr. Dalene Seltzer via telephone at 2 p.m. Electronically Signed   By: Feliberto Harts M.D.   On: 04/22/2022 14:08   Result Date: 04/22/2022 CLINICAL DATA:  Neuro deficit, acute, stroke suspected EXAM: CT ANGIOGRAPHY HEAD AND NECK WITH AND WITHOUT CONTRAST TECHNIQUE: Multidetector CT imaging of the head and neck was performed using the standard protocol during bolus administration of intravenous contrast. Multiplanar CT image reconstructions and MIPs were obtained to evaluate the vascular anatomy. Carotid stenosis measurements (when applicable) are obtained utilizing NASCET criteria, using the distal internal carotid diameter as the denominator. RADIATION DOSE REDUCTION: This exam was performed according to the departmental dose-optimization program which includes automated exposure control, adjustment of the mA and/or kV according to patient size and/or use of iterative reconstruction technique. CONTRAST:  75mL OMNIPAQUE IOHEXOL 350 MG/ML SOLN COMPARISON:  MRI head same day.  FINDINGS: CT HEAD FINDINGS Brain: Left MCA and PCA territory infarcts, better characterized on same day MRI. No mass occupying acute hemorrhage or midline shift. No hydrocephalus. No mass lesion  or extra-axial fluid collection. Vascular: See below. Skull: No acute fracture. Sinuses/Orbits: Largely clear sinuses.  No acute orbital findings. Other: No mastoid effusions. Review of the MIP images confirms the above findings CTA NECK FINDINGS Aortic arch: Great vessel origins are patent without significant stenosis. Right carotid system: No evidence of dissection, stenosis (50% or greater), or occlusion. Left carotid system: No evidence of dissection, stenosis (50% or greater), or occlusion. Vertebral arteries: Codominant. No evidence of dissection, stenosis (50% or greater), or occlusion. Skeleton: No acute fracture on limited assessment. Other neck: No acute abnormality on limited assessment. Upper chest: Visualized lung apices are clear. Review of the MIP images confirms the above findings CTA HEAD FINDINGS Anterior circulation: Bilateral intracranial ICAs are patent. Severe stenosis of proximal left M2 MCA stenosis. Moderate right M1 MCA stenosis. Posterior circulation: Severe stenosis of the proximal intradural vertebral artery, likely due to atherosclerosis. The basilar artery and right posterior cerebral artery are patent. Occlusion of the distal left P2 PCA. Venous sinuses: As permitted by contrast timing, patent. Review of the MIP images confirms the above findings IMPRESSION: 1. Occlusion of the distal left P2 PCA. 2. Severe left M2 MCA stenosis. 3. Severe proximal left intradural vertebral artery stenosis. 4. Moderate right M1 MCA stenosis. Electronically Signed: By: Feliberto Harts M.D. On: 04/22/2022 13:55   MR Brain Wo Contrast  Result Date: 04/22/2022 CLINICAL DATA:  37 year old male "slurred speech, numbness and tingling, right facial numbness". Symptom onset reportedly began last week. History of  hypertension and diabetes. EXAM: MRI HEAD WITHOUT CONTRAST TECHNIQUE: Multiplanar, multiecho pulse sequences of the brain and surrounding structures were obtained without intravenous contrast. COMPARISON:  No prior brain imaging. FINDINGS: Brain: Relatively large, roughly 5 cm area of confluent restricted diffusion in the left occipital pole, affecting the inferior left parietal lobe (left PCA territory). Left thalamus and temporal lobe appear relatively spared, but there is a roughly 1 cm area of diffusion restriction also in the splenium of the corpus callosum. Furthermore, there is similar patchy, scattered gyral and subcortical white matter diffusion restriction in the left middle frontal gyrus (middle division left MCA territory). Some of the left insula and inferior frontal gyrus also affected. No restricted diffusion identified in the right hemisphere or the posterior fossa. T2 hyperintense cytotoxic edema in the affected areas. Mild petechial hemorrhage in the left occipital pole on SWI (series 11, image 31). No other convincing blood products. No superimposed midline shift, ventriculomegaly, extra-axial collection. Left hemisphere and posterior fossa gray and white matter signal appears to remain normal. Cervicomedullary junction and pituitary are within normal limits. Vascular: Major intracranial vascular flow voids are preserved. Skull and upper cervical spine: Negative visible cervical spine. Visualized bone marrow signal is within normal limits. Sinuses/Orbits: Rightward gaze. Otherwise negative orbits soft tissues. Scattered paranasal sinus mucosal thickening is mild. Other: Mastoids appear clear. Grossly normal visible internal auditory structures. Negative visible scalp and face. IMPRESSION: 1. Positive for acute to early subacute large or medium-sized vessel type infarcts in both the Left MCA and Left PCA territories. Cytotoxic edema with minor petechial hemorrhage in the left occipital lobe. No  malignant hemorrhagic transformation or intracranial mass effect. No ischemia in the right hemisphere or posterior fossa, which argues against a recent cardiac embolic event. 2. Study discussed by telephone with Dr. Sanda Linger on 04/22/2022 at 1012 hours. And we are arranging for the patient to present to the [facility] Emergency Department for additional Stroke care and workup. Electronically Signed   By:  Odessa Fleming M.D.   On: 04/22/2022 10:23     Assessment: 37 year old male presenting with new onset of slurred speech with RUE weakness and numbness. The patient states that he was diagnosed with atrial fibrillation several years ago that to the best of his recollection resolved with a cardiac procedure. - Exam reveals mild right sided weakness, subtle right facial droop, mild dysarthria and right visual field cut. All of these findings are referable to the strokes seen on MRI.  - CTA of head and neck: Occlusion of the distal left P2 PCA. Severe left M2 MCA stenosis. Severe proximal left intradural vertebral artery stenosis. Moderate right M1 MCA stenosis. - MRI brain: Positive for acute to early subacute large or medium-sized vessel type infarcts in both the Left MCA and Left PCA territories. Cytotoxic edema with minor petechial hemorrhage in the left occipital lobe. No malignant hemorrhagic transformation or intracranial mass effect. No ischemia in the right hemisphere or posterior fossa, which argues against a recent cardiac embolic event. - HgbA1c was 14.3 on 4/4.  - Lipid panel on 4/4 with elevated cholesterol and triglycerides.  - Stroke risk factors: Possible recurrent atrial fibrillation, DM, HLD, obesity, OSA and tobacco use disorder    Recommendations: 1. HgbA1c, fasting lipid panel 2. Cardiac telemetry 3. PT consult, OT consult, Speech consult 4. Echocardiogram 5. Continue rosuvastatin 6. Start ASA and Plavix DAPT. Continue for 21 days then discontinue Plavix and then continue ASA  monotherapy indefinitely.  7. Risk factor modification 8. Frequent neuro checks 10. NPO until passes stroke swallow screen 11. Improved glycemic control outpatient 12. SSI 13. BP management per standard protocol. Out of the permissive HTN time window.   Electronically signed: Dr. Caryl Pina 04/22/2022, 2:25 PM

## 2022-04-22 NOTE — ED Provider Notes (Signed)
[facility] AT [facility] Provider Note   CSN: [id number] Arrival date & time: 04/22/22  1030     History  Chief Complaint  Patient presents with   Numbness    Jesse Duncan is a 37 y.o. male.  HPI      37 year old male with a history of type 2 diabetes, hyperlipidemia, hypertension who was seen by his PCP for 4-day history of slurred speech, right facial and right hand numbness 2 days ago, found to have CVA on MRI brain.  Reports on Monday he noted weakness of his right hand.  Initially describes as a numbness, but then reports is more of a weakness, has not difficulty writing, difficulty gripping things.  On Tuesday, he noted each changes, like he is stumbling over his words and having a harder time getting them out.  He has had vision changes off-and-on over months.  About 1 month ago he noticed some peripheral vision changes on the right.  Denies any leg weakness, difficulty ambulating.  Denies headache, nausea, vomiting, recent illness including no fevers, cough, shortness of breath or chest pain.  Reports that his symptoms have remained constant since Monday to Tuesday when they first developed.  Home Medications Prior to Admission medications   Medication Sig Start Date End Date Taking? Authorizing Provider  aspirin EC 325 MG tablet Take 1 tablet (325 mg total) by mouth daily. Swallow whole. Take with Plavix for 90 days, after 90 stop plavix and continue on Aspirin 04/23/22 04/23/23 Yes Ghimire, Werner Lean, MD  clopidogrel (PLAVIX) 75 MG tablet Take 1 tablet (75 mg total) by mouth daily. Take along with aspirin for 90 days and then stop Plavix, but continue on ASA 04/23/22 04/23/23 Yes Ghimire, Werner Lean, MD  diltiazem (CARDIZEM CD) 300 MG 24 hr capsule TAKE 1 CAPSULE BY MOUTH EVERY DAY 09/28/21  Yes Storm Frisk, MD  glucose blood test strip Use as instructed 09/28/21  Yes Storm Frisk, MD  insulin aspart (FIASP) 100 UNIT/ML FlexTouch  Pen Inject 10 Units into the skin 3 (three) times daily with meals. 04/10/22  Yes [name], MD  insulin glargine, 1 Unit Dial, (TOUJEO SOLOSTAR) 300 UNIT/ML Solostar Pen Inject 40 Units into the skin daily. 04/10/22  Yes [name], MD  Insulin Pen Needle (BD PEN NEEDLE NANO U/F) 32G X 4 MM MISC 1 Act by Subdermal route in the morning, at noon, in the evening, and at bedtime. Use to inject insulin once daily 04/10/22  Yes [name], MD  metFORMIN (GLUCOPHAGE) 1000 MG tablet Take 1 tablet (1,000 mg total) by mouth 2 (two) times daily with a meal. 04/10/22  Yes [name], MD  OneTouch UltraSoft 2 Lancets MISC 1 Units by Does not apply route 3 (three) times daily. Before meals 09/28/21  Yes Storm Frisk, MD  pantoprazole (PROTONIX) 40 MG tablet Take 1 tablet (40 mg total) by mouth daily. 04/23/22 04/23/23 Yes Ghimire, Werner Lean, MD  Semaglutide (RYBELSUS) 7 MG TABS Take 7 mg by mouth daily. 09/28/21  Yes Storm Frisk, MD  carvedilol (COREG) 12.5 MG tablet Take 1 tablet (12.5 mg total) by mouth 2 (two) times daily with a meal. Patient not taking: Reported on 04/22/2022 09/28/21   Storm Frisk, MD  nicotine polacrilex (NICORETTE MINI) 4 MG lozenge Use 4mg  three times a day to stop smoking Patient not taking: Reported on 04/22/2022 09/28/21   Storm Frisk, MD  rosuvastatin (CRESTOR) 40  MG tablet Take 1 tablet (40 mg total) by mouth daily. 04/23/22 10/20/22  Ghimire, Werner Lean, MD  valsartan-hydrochlorothiazide (DIOVAN-HCT) 320-25 MG tablet Take 1 tablet by mouth daily. Patient not taking: Reported on 04/22/2022 09/28/21   Storm Frisk, MD      Allergies    Patient has no known allergies.    Review of Systems   Review of Systems  Physical Exam Updated Vital Signs BP (!) 132/92 (BP Location: Right Arm)   Pulse 76   Temp 99 F (37.2 C) (Oral)   Resp (!) 26   Ht 6\' 1"  (1.854 m)   Wt 122.9 kg   SpO2 97%   BMI 35.75 kg/m  Physical Exam Constitutional:       General: He is not in acute distress.    Appearance: Normal appearance. He is not ill-appearing.  HENT:     Head: Normocephalic and atraumatic.  Eyes:     General: Visual field deficit (right) present.     Extraocular Movements: Extraocular movements intact.     Conjunctiva/sclera: Conjunctivae normal.     Pupils: Pupils are equal, round, and reactive to light.  Cardiovascular:     Rate and Rhythm: Normal rate and regular rhythm.     Pulses: Normal pulses.  Pulmonary:     Effort: Pulmonary effort is normal. No respiratory distress.  Musculoskeletal:        General: No swelling or tenderness.     Cervical back: Normal range of motion.  Skin:    General: Skin is warm and dry.     Findings: No erythema or rash.  Neurological:     General: No focal deficit present.     Mental Status: He is alert and oriented to person, place, and time.     GCS: GCS eye subscore is 4. GCS verbal subscore is 5. GCS motor subscore is 6.     Cranial Nerves: No cranial nerve deficit, dysarthria or facial asymmetry.     Sensory: No sensory deficit.     Motor: Weakness (right grip, mild right arm.) present. No tremor.     Coordination: Coordination normal. Finger-Nose-Finger Test normal.     Gait: Gait normal.     ED Results / Procedures / Treatments   Labs (all labs ordered are listed, but only abnormal results are displayed) Labs Reviewed  CBC - Abnormal; Notable for the following components:      Result Value   Hemoglobin 12.5 (*)    HCT 38.4 (*)    All other components within normal limits  COMPREHENSIVE METABOLIC PANEL - Abnormal; Notable for the following components:   Glucose, Bld 229 (*)    Total Bilirubin 1.5 (*)    All other components within normal limits  LIPID PANEL - Abnormal; Notable for the following components:   Cholesterol 288 (*)    Triglycerides 313 (*)    HDL 36 (*)    VLDL 63 (*)    LDL Cholesterol 189 (*)    All other components within normal limits  GLUCOSE, CAPILLARY  - Abnormal; Notable for the following components:   Glucose-Capillary 286 (*)    All other components within normal limits  GLUCOSE, CAPILLARY - Abnormal; Notable for the following components:   Glucose-Capillary 224 (*)    All other components within normal limits  GLUCOSE, CAPILLARY - Abnormal; Notable for the following components:   Glucose-Capillary 154 (*)    All other components within normal limits  GLUCOSE, CAPILLARY - Abnormal; Notable for  the following components:   Glucose-Capillary 108 (*)    All other components within normal limits  CBG MONITORING, ED - Abnormal; Notable for the following components:   Glucose-Capillary 204 (*)    All other components within normal limits  ETHANOL  PROTIME-INR  APTT  DIFFERENTIAL  HIV ANTIBODY (ROUTINE TESTING W REFLEX)    EKG EKG Interpretation  Date/Time:  Thursday April 22 2022 10:48:09 EDT Ventricular Rate:  84 PR Interval:  126 QRS Duration: 96 QT Interval:  372 QTC Calculation: 439 R Axis:   30 Text Interpretation: Normal sinus rhythm T wave abnormality, consider inferolateral ischemia Abnormal ECG When compared with ECG of 16-Mar-2021 06:12, PREVIOUS ECG IS PRESENT Confirmed by Kommor, Madison [phone]) on 04/23/2022 3:43:57 PM  Radiology ECHOCARDIOGRAM COMPLETE  Result Date: 04/23/2022    ECHOCARDIOGRAM REPORT   Patient Name:   Jesse Duncan Date of Exam: 04/23/2022 Medical Rec #:  [id number]          Height:       73.0 in Accession #:    [id number]         Weight:       271.0 lb Date of Birth:  February 09, 1985          BSA:          2.448 m Patient Age:    36 years           BP:           124/73 mmHg Patient Gender: M                  HR:           63 bpm. Exam Location:  Inpatient Procedure: 2D Echo, Cardiac Doppler and Color Doppler Indications:    Stroke  History:        Patient has prior history of Echocardiogram examinations, most                 recent 08/04/2015. Stroke, Arrythmias:Atrial Fibrillation; Risk                  Factors:Diabetes, Current Smoker and HLD.  Sonographer:    Lucy Antigua Referring Phys: [id number] Werner Lean GHIMIRE IMPRESSIONS  1. Left ventricular ejection fraction, by estimation, is 40 to 45%. The left ventricle has mildly decreased function. The left ventricle demonstrates global hypokinesis. There is mild concentric left ventricular hypertrophy. Left ventricular diastolic parameters are consistent with Grade I diastolic dysfunction (impaired relaxation).  2. Right ventricular systolic function is normal. The right ventricular size is normal. Tricuspid regurgitation signal is inadequate for assessing PA pressure.  3. The mitral valve is normal in structure. No evidence of mitral valve regurgitation. No evidence of mitral stenosis.  4. The aortic valve is tricuspid. Aortic valve regurgitation is not visualized. No aortic stenosis is present.  5. The inferior vena cava is normal in size with greater than 50% respiratory variability, suggesting right atrial pressure of 3 mmHg. FINDINGS  Left Ventricle: Left ventricular ejection fraction, by estimation, is 40 to 45%. The left ventricle has mildly decreased function. The left ventricle demonstrates global hypokinesis. The left ventricular internal cavity size was normal in size. There is  mild concentric left ventricular hypertrophy. Left ventricular diastolic parameters are consistent with Grade I diastolic dysfunction (impaired relaxation). Right Ventricle: The right ventricular size is normal. No increase in right ventricular wall thickness. Right ventricular systolic function is normal. Tricuspid regurgitation signal is inadequate for assessing PA pressure. Left Atrium: Left  atrial size was normal in size. Right Atrium: Right atrial size was normal in size. Pericardium: There is no evidence of pericardial effusion. Mitral Valve: The mitral valve is normal in structure. Mild mitral annular calcification. No evidence of mitral valve regurgitation. No evidence of mitral  valve stenosis. Tricuspid Valve: The tricuspid valve is normal in structure. Tricuspid valve regurgitation is not demonstrated. Aortic Valve: The aortic valve is tricuspid. Aortic valve regurgitation is not visualized. No aortic stenosis is present. Aortic valve mean gradient measures 3.0 mmHg. Aortic valve peak gradient measures 6.7 mmHg. Aortic valve area, by VTI measures 3.86 cm. Pulmonic Valve: The pulmonic valve was normal in structure. Pulmonic valve regurgitation is not visualized. Aorta: The aortic root is normal in size and structure. Venous: The inferior vena cava is normal in size with greater than 50% respiratory variability, suggesting right atrial pressure of 3 mmHg. IAS/Shunts: No atrial level shunt detected by color flow Doppler.  LEFT VENTRICLE PLAX 2D LVIDd:         5.40 cm   Diastology LVIDs:         3.80 cm   LV e' medial:    5.13 cm/s LV PW:         1.40 cm   LV E/e' medial:  11.7 LV IVS:        1.20 cm   LV e' lateral:   8.24 cm/s LVOT diam:     2.60 cm   LV E/e' lateral: 7.3 LV SV:         83 LV SV Index:   34 LVOT Area:     5.31 cm  RIGHT VENTRICLE RV S prime:     15.20 cm/s TAPSE (M-mode): 2.2 cm LEFT ATRIUM             Index        RIGHT ATRIUM           Index LA Vol (A2C):   25.5 ml 10.42 ml/m  RA Area:     15.30 cm LA Vol (A4C):   30.6 ml 12.50 ml/m  RA Volume:   37.30 ml  15.24 ml/m LA Biplane Vol: 29.9 ml 12.21 ml/m  AORTIC VALVE AV Area (Vmax):    3.44 cm AV Area (Vmean):   3.42 cm AV Area (VTI):     3.86 cm AV Vmax:           129.00 cm/s AV Vmean:          84.400 cm/s AV VTI:            0.216 m AV Peak Grad:      6.7 mmHg AV Mean Grad:      3.0 mmHg LVOT Vmax:         83.60 cm/s LVOT Vmean:        54.400 cm/s LVOT VTI:          0.157 m LVOT/AV VTI ratio: 0.73  AORTA Ao Root diam: 3.40 cm Ao Asc diam:  2.80 cm MITRAL VALVE MV Area (PHT): 2.76 cm    SHUNTS MV Decel Time: 275 msec    Systemic VTI:  0.16 m MV E velocity: 59.80 cm/s  Systemic Diam: 2.60 cm MV A velocity: 58.60  cm/s MV E/A ratio:  1.02 Jesse McleanMD Electronically signed by Wilfred Lacy Signature Date/Time: 04/23/2022/4:10:48 PM    Final    CT ANGIO HEAD NECK W WO CM  Addendum Date: 04/22/2022   ADDENDUM REPORT: 04/22/2022 14:08 ADDENDUM: Findings discussed with  Dr. Dalene Seltzer via telephone at 2 p.m. Electronically Signed   By: Feliberto Harts M.D.   On: 04/22/2022 14:08   Result Date: 04/22/2022 CLINICAL DATA:  Neuro deficit, acute, stroke suspected EXAM: CT ANGIOGRAPHY HEAD AND NECK WITH AND WITHOUT CONTRAST TECHNIQUE: Multidetector CT imaging of the head and neck was performed using the standard protocol during bolus administration of intravenous contrast. Multiplanar CT image reconstructions and MIPs were obtained to evaluate the vascular anatomy. Carotid stenosis measurements (when applicable) are obtained utilizing NASCET criteria, using the distal internal carotid diameter as the denominator. RADIATION DOSE REDUCTION: This exam was performed according to the departmental dose-optimization program which includes automated exposure control, adjustment of the mA and/or kV according to patient size and/or use of iterative reconstruction technique. CONTRAST:  75mL OMNIPAQUE IOHEXOL 350 MG/ML SOLN COMPARISON:  MRI head same day. FINDINGS: CT HEAD FINDINGS Brain: Left MCA and PCA territory infarcts, better characterized on same day MRI. No mass occupying acute hemorrhage or midline shift. No hydrocephalus. No mass lesion or extra-axial fluid collection. Vascular: See below. Skull: No acute fracture. Sinuses/Orbits: Largely clear sinuses.  No acute orbital findings. Other: No mastoid effusions. Review of the MIP images confirms the above findings CTA NECK FINDINGS Aortic arch: Great vessel origins are patent without significant stenosis. Right carotid system: No evidence of dissection, stenosis (50% or greater), or occlusion. Left carotid system: No evidence of dissection, stenosis (50% or greater), or  occlusion. Vertebral arteries: Codominant. No evidence of dissection, stenosis (50% or greater), or occlusion. Skeleton: No acute fracture on limited assessment. Other neck: No acute abnormality on limited assessment. Upper chest: Visualized lung apices are clear. Review of the MIP images confirms the above findings CTA HEAD FINDINGS Anterior circulation: Bilateral intracranial ICAs are patent. Severe stenosis of proximal left M2 MCA stenosis. Moderate right M1 MCA stenosis. Posterior circulation: Severe stenosis of the proximal intradural vertebral artery, likely due to atherosclerosis. The basilar artery and right posterior cerebral artery are patent. Occlusion of the distal left P2 PCA. Venous sinuses: As permitted by contrast timing, patent. Review of the MIP images confirms the above findings IMPRESSION: 1. Occlusion of the distal left P2 PCA. 2. Severe left M2 MCA stenosis. 3. Severe proximal left intradural vertebral artery stenosis. 4. Moderate right M1 MCA stenosis. Electronically Signed: By: Feliberto Harts M.D. On: 04/22/2022 13:55   MR Brain Wo Contrast  Result Date: 04/22/2022 CLINICAL DATA:  37 year old male "slurred speech, numbness and tingling, right facial numbness". Symptom onset reportedly began last week. History of hypertension and diabetes. EXAM: MRI HEAD WITHOUT CONTRAST TECHNIQUE: Multiplanar, multiecho pulse sequences of the brain and surrounding structures were obtained without intravenous contrast. COMPARISON:  No prior brain imaging. FINDINGS: Brain: Relatively large, roughly 5 cm area of confluent restricted diffusion in the left occipital pole, affecting the inferior left parietal lobe (left PCA territory). Left thalamus and temporal lobe appear relatively spared, but there is a roughly 1 cm area of diffusion restriction also in the splenium of the corpus callosum. Furthermore, there is similar patchy, scattered gyral and subcortical white matter diffusion restriction in the left  middle frontal gyrus (middle division left MCA territory). Some of the left insula and inferior frontal gyrus also affected. No restricted diffusion identified in the right hemisphere or the posterior fossa. T2 hyperintense cytotoxic edema in the affected areas. Mild petechial hemorrhage in the left occipital pole on SWI (series 11, image 31). No other convincing blood products. No superimposed midline shift, ventriculomegaly, extra-axial collection. Left hemisphere  and posterior fossa gray and white matter signal appears to remain normal. Cervicomedullary junction and pituitary are within normal limits. Vascular: Major intracranial vascular flow voids are preserved. Skull and upper cervical spine: Negative visible cervical spine. Visualized bone marrow signal is within normal limits. Sinuses/Orbits: Rightward gaze. Otherwise negative orbits soft tissues. Scattered paranasal sinus mucosal thickening is mild. Other: Mastoids appear clear. Grossly normal visible internal auditory structures. Negative visible scalp and face. IMPRESSION: 1. Positive for acute to early subacute large or medium-sized vessel type infarcts in both the Left MCA and Left PCA territories. Cytotoxic edema with minor petechial hemorrhage in the left occipital lobe. No malignant hemorrhagic transformation or intracranial mass effect. No ischemia in the right hemisphere or posterior fossa, which argues against a recent cardiac embolic event. 2. Study discussed by telephone with Dr. Sanda Linger on 04/22/2022 at 1012 hours. And we are arranging for the patient to present to the [facility] Emergency Department for additional Stroke care and workup. Electronically Signed   By: Odessa Fleming M.D.   On: 04/22/2022 10:23    Procedures Procedures    Medications Ordered in ED Medications  iohexol (OMNIPAQUE) 350 MG/ML injection 75 mL (75 mLs Intravenous Contrast Given 04/22/22 1253)   stroke: early stages of recovery book ( Does not apply Given  04/23/22 0847)  living well with diabetes book MISC ( Does not apply Given 04/23/22 1318)    ED Course/ Medical Decision Making/ A&P                               37 year old male with a history of type 2 diabetes, hyperlipidemia, hypertension who was seen by his PCP for 4-day history of slurred speech, right facial and right hand numbness 2 days ago, found to have CVA on MRI brain.   Reviewed MRI was completed this morning showing left MCA and left PCA territory stroke, cytotoxic edema with minor petechial hemorrhage in the left occipital lobe  Labs completed and personally interpreted by me show mild hyperglycemia without diabetic emergency, hemoglobin of 12.5.  CTA completed to evaluate for signs of occlusion.  CTA shows a occlusion of the left distal PCA, severe left MCA stenosis, severe proximal left intradural vertebral artery stenosis and moderate M1 MCA stenosis.  Discussed with neurology, consulted Dr. Otelia Limes.  Will admit to the hospitalist for further care.          Final Clinical Impression(s) / ED Diagnoses Final diagnoses:  Cerebrovascular accident (CVA), unspecified mechanism    Rx / DC Orders ED Discharge Orders          Ordered    apixaban (ELIQUIS) 5 MG TABS tablet  2 times daily,   Status:  Discontinued        04/23/22 1111    Ambulatory referral to Neurology       Comments: An appointment is requested in approximately: 8 weeks   04/23/22 1112    rosuvastatin (CRESTOR) 40 MG tablet  Daily        04/23/22 1129    Increase activity slowly        04/23/22 1133    Diet - low sodium heart healthy        04/23/22 1133    Discharge instructions       Comments: Follow with Primary MD  [name], MD in 1-2 weeks  Please follow-up with stroke clinic as instructed Follow-up with cardiology as instructed  Please keep a record of your CBGs and take it to your next appointment with your primary care practitioner  Please take all your medications  including insulin/statin as prescribed.  Please get a complete blood count and chemistry panel checked by your Primary MD at your next visit, and again as instructed by your Primary MD.  Get Medicines reviewed and adjusted: Please take all your medications with you for your next visit with your Primary MD  Laboratory/radiological data: Please request your Primary MD to go over all hospital tests and procedure/radiological results at the follow up, please ask your Primary MD to get all Hospital records sent to his/her office.  In some cases, they will be blood work, cultures and biopsy results pending at the time of your discharge. Please request that your primary care M.D. follows up on these results.  Also Note the following: If you experience worsening of your admission symptoms, develop shortness of breath, life threatening emergency, suicidal or homicidal thoughts you must seek medical attention immediately by calling 911 or calling your MD immediately  if symptoms less severe.  You must read complete instructions/literature along with all the possible adverse reactions/side effects for all the Medicines you take and that have been prescribed to you. Take any new Medicines after you have completely understood and accpet all the possible adverse reactions/side effects.   Do not drive when taking Pain medications or sleeping medications (Benzodaizepines)  Do not take more than prescribed Pain, Sleep and Anxiety Medications. It is not advisable to combine anxiety,sleep and pain medications without talking with your primary care practitioner  Special Instructions: If you have smoked or chewed Tobacco  in the last 2 yrs please stop smoking, stop any regular Alcohol  and or any Recreational drug use.  Wear Seat belts while driving.  Please note: You were cared for by a hospitalist during your hospital stay. Once you are discharged, your primary care physician will handle any further medical  issues. Please note that NO REFILLS for any discharge medications will be authorized once you are discharged, as it is imperative that you return to your primary care physician (or establish a relationship with a primary care physician if you do not have one) for your post hospital discharge needs so that they can reassess your need for medications and monitor your lab values.   04/23/22 1133    Diet Carb Modified        04/23/22 1133    Call MD for:  extreme fatigue        04/23/22 1133    Call MD for:  persistant dizziness or light-headedness        04/23/22 1133    aspirin EC 325 MG tablet  Daily        04/23/22 1447    clopidogrel (PLAVIX) 75 MG tablet  Daily        04/23/22 1447    pantoprazole (PROTONIX) 40 MG tablet  Daily        04/23/22 1447              Alvira Monday, MD 04/23/22 2243

## 2022-04-23 ENCOUNTER — Other Ambulatory Visit (HOSPITAL_COMMUNITY): Payer: Self-pay

## 2022-04-23 ENCOUNTER — Other Ambulatory Visit: Payer: Self-pay | Admitting: Cardiology

## 2022-04-23 ENCOUNTER — Inpatient Hospital Stay (HOSPITAL_COMMUNITY): Payer: BC Managed Care – PPO

## 2022-04-23 ENCOUNTER — Ambulatory Visit: Payer: Self-pay | Attending: Emergency Medicine

## 2022-04-23 ENCOUNTER — Other Ambulatory Visit: Payer: Self-pay

## 2022-04-23 DIAGNOSIS — I639 Cerebral infarction, unspecified: Secondary | ICD-10-CM | POA: Diagnosis not present

## 2022-04-23 DIAGNOSIS — Z720 Tobacco use: Secondary | ICD-10-CM | POA: Diagnosis not present

## 2022-04-23 DIAGNOSIS — Z794 Long term (current) use of insulin: Secondary | ICD-10-CM

## 2022-04-23 DIAGNOSIS — E119 Type 2 diabetes mellitus without complications: Secondary | ICD-10-CM | POA: Diagnosis not present

## 2022-04-23 DIAGNOSIS — E785 Hyperlipidemia, unspecified: Secondary | ICD-10-CM

## 2022-04-23 DIAGNOSIS — E1169 Type 2 diabetes mellitus with other specified complication: Secondary | ICD-10-CM

## 2022-04-23 DIAGNOSIS — I480 Paroxysmal atrial fibrillation: Secondary | ICD-10-CM | POA: Diagnosis not present

## 2022-04-23 DIAGNOSIS — E7801 Familial hypercholesterolemia: Secondary | ICD-10-CM

## 2022-04-23 DIAGNOSIS — I6389 Other cerebral infarction: Secondary | ICD-10-CM

## 2022-04-23 LAB — LIPID PANEL
Cholesterol: 288 mg/dL — ABNORMAL HIGH (ref 0–200)
HDL: 36 mg/dL — ABNORMAL LOW (ref >40)
LDL Cholesterol: 189 mg/dL — ABNORMAL HIGH (ref 0–99)
Total CHOL/HDL Ratio: 8 RATIO
Triglycerides: 313 mg/dL — ABNORMAL HIGH (ref <150)
VLDL: 63 mg/dL — ABNORMAL HIGH (ref 0–40)

## 2022-04-23 LAB — GLUCOSE, CAPILLARY
Glucose-Capillary: 108 mg/dL — ABNORMAL HIGH (ref 70–99)
Glucose-Capillary: 154 mg/dL — ABNORMAL HIGH (ref 70–99)
Glucose-Capillary: 224 mg/dL — ABNORMAL HIGH (ref 70–99)
Glucose-Capillary: 286 mg/dL — ABNORMAL HIGH (ref 70–99)

## 2022-04-23 LAB — ECHOCARDIOGRAM COMPLETE
AR max vel: 3.44 cm2
AV Area VTI: 3.86 cm2
AV Area mean vel: 3.42 cm2
AV Mean grad: 3 mmHg
AV Peak grad: 6.7 mmHg
Ao pk vel: 1.29 m/s
Area-P 1/2: 2.76 cm2
Height: 73 in
S' Lateral: 3.8 cm
Weight: 4336 oz

## 2022-04-23 MED ORDER — PANTOPRAZOLE SODIUM 40 MG PO TBEC
40.0000 mg | DELAYED_RELEASE_TABLET | Freq: Every day | ORAL | 1 refills | Status: DC
  Filled 2022-04-23: qty 30, 30d supply, fill #0

## 2022-04-23 MED ORDER — ASPIRIN 325 MG PO TBEC
325.0000 mg | DELAYED_RELEASE_TABLET | Freq: Every day | ORAL | 2 refills | Status: AC
  Filled 2022-04-23: qty 90, 90d supply, fill #0
  Filled 2022-09-23: qty 90, 90d supply, fill #1

## 2022-04-23 MED ORDER — ROSUVASTATIN CALCIUM 40 MG PO TABS
40.0000 mg | ORAL_TABLET | Freq: Every day | ORAL | 1 refills | Status: DC
Start: 2022-04-23 — End: 2022-09-22
  Filled 2022-04-23 (×2): qty 30, 30d supply, fill #0

## 2022-04-23 MED ORDER — LIVING WELL WITH DIABETES BOOK
Freq: Once | Status: AC
  Filled 2022-04-23: qty 1

## 2022-04-23 MED ORDER — APIXABAN 5 MG PO TABS
5.0000 mg | ORAL_TABLET | Freq: Two times a day (BID) | ORAL | 3 refills | Status: DC
  Filled 2022-04-23: qty 60, 30d supply, fill #0

## 2022-04-23 MED ORDER — CLOPIDOGREL BISULFATE 75 MG PO TABS
75.0000 mg | ORAL_TABLET | Freq: Every day | ORAL | 0 refills | Status: DC
  Filled 2022-04-23: qty 30, 30d supply, fill #0

## 2022-04-23 MED ORDER — APIXABAN 5 MG PO TABS
5.0000 mg | ORAL_TABLET | Freq: Two times a day (BID) | ORAL | Status: DC
  Administered 2022-04-23: 5 mg via ORAL
  Filled 2022-04-23: qty 1

## 2022-04-23 MED ORDER — ASPIRIN 81 MG PO TBEC: 81.0000 mg | DELAYED_RELEASE_TABLET | Freq: Every day | ORAL | Status: DC

## 2022-04-23 MED ORDER — ASPIRIN 325 MG PO TBEC: 325.0000 mg | DELAYED_RELEASE_TABLET | Freq: Every day | ORAL | Status: DC

## 2022-04-23 MED ORDER — CLOPIDOGREL BISULFATE 75 MG PO TABS: 75.0000 mg | ORAL_TABLET | Freq: Every day | ORAL | Status: DC

## 2022-04-23 NOTE — Evaluation (Signed)
Occupational Therapy Evaluation  Patient Details Name: Jesse Duncan MRN: [id number] DOB: 16-Jan-1985 Today's Date: 04/23/2022   History of Present Illness 37 y/o M admitted to [facility] on 4/18 for R arm weakness/numbness and speech problem. MRI shows early subacute large and medium sized vessel time infarct in both left MCA and left PCA. Cytotoxic edema with minor petechial hemorrhage in left occipital lobe. PMHx: PAF, HTN, chronic combined HFrEF and HFpEF, HLD, IDDM, cigarette smoking.   Clinical Impression   Pt educated in home exercise program for R hand and vision compensatory strategies for R visual field deficits. Pt verbalizing and/or demonstrating understanding. He is independent in self care and and mobility. 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 Recommending OPOT.     Recommendations for follow up therapy are one component of a multi-disciplinary discharge planning process, led by the attending physician.  Recommendations may be updated based on patient status, additional functional criteria and insurance authorization.   Assistance Recommended at Discharge PRN  Patient can return home with the following Assist for transportation    Functional Status Assessment  Patient has had a recent decline in their functional status and demonstrates the ability to make significant improvements in function in a reasonable and predictable amount of time.  Equipment Recommendations  None recommended by OT    Recommendations for Other Services       Precautions / Restrictions Precautions Precautions: Fall      Mobility Bed Mobility Overal bed mobility: Independent                  Transfers Overall transfer level: Independent Equipment used: None                      Balance                                           ADL either performed or assessed with clinical judgement   ADL Overall ADL's : Modified  independent                                             Vision   Additional Comments: L field cut     Perception     Praxis      Pertinent Vitals/Pain Pain Assessment Pain Assessment: No/denies pain     Hand Dominance Right   Extremity/Trunk Assessment Upper Extremity Assessment Upper Extremity Assessment: RUE deficits/detail RUE Deficits / Details: tingling, weakness and incoordination in hand RUE Coordination: decreased fine motor   Lower Extremity Assessment Lower Extremity Assessment: Defer to PT evaluation   Cervical / Trunk Assessment Cervical / Trunk Assessment: Normal   Communication Communication Communication: Expressive difficulties   Cognition Arousal/Alertness: Awake/alert Behavior During Therapy: Flat affect Overall Cognitive Status: Within Functional Limits for tasks assessed                                       General Comments       Exercises     Shoulder Instructions      Home Living Family/patient expects to be discharged to:: Private residence Living Arrangements: Children;Spouse/significant other Available Help at  Discharge: Family;Available PRN/intermittently Type of Home: House Home Access: Level entry     Home Layout: Two level;Bed/bath upstairs Alternate Level Stairs-Number of Steps: 17 Alternate Level Stairs-Rails: Right Bathroom Shower/Tub: Producer, television/film/video: Standard     Home Equipment: None   Additional Comments: wife works from home 2 days/week      Prior Functioning/Environment Prior Level of Function : Independent/Modified Independent;Working/employed;Driving                        OT Problem List: Decreased strength;Impaired vision/perception;Decreased coordination      OT Treatment/Interventions:      OT Goals(Current goals can be found in the care plan section) Acute Rehab OT Goals OT Goal Formulation: With patient  OT Frequency:       Co-evaluation              AM-PAC OT "6 Clicks" Daily Activity     Outcome Measure Help from another person eating meals?: None Help from another person taking care of personal grooming?: None Help from another person toileting, which includes using toliet, bedpan, or urinal?: None Help from another person bathing (including washing, rinsing, drying)?: None Help from another person to put on and taking off regular upper body clothing?: None Help from another person to put on and taking off regular lower body clothing?: None 6 Click Score: 24   End of Session    Activity Tolerance: Patient tolerated treatment well Patient left: in bed;with call bell/phone within reach;with family/visitor present  OT Visit Diagnosis: Hemiplegia and hemiparesis Hemiplegia - Right/Left: Right Hemiplegia - dominant/non-dominant: Dominant Hemiplegia - caused by: Cerebral infarction                Time: 1430-1455 OT Time Calculation (min): 25 min Charges:  OT General Charges $OT Visit: 1 Visit OT Evaluation $OT Eval Low Complexity: 1 Low OT Treatments $Neuromuscular Re-education: 8-22 mins  Berna Spare, OTR/L Acute Rehabilitation Services Office: [phone]   Evern Bio 04/23/2022, 4:16 PM

## 2022-04-23 NOTE — Progress Notes (Addendum)
ANTICOAGULATION CONSULT NOTE - Initial Consult  Pharmacy Consult for apixaban>>ASA Indication: atrial fibrillation and stroke  No Known Allergies  Patient Measurements: Height:  (185.4 cm) Weight: 122.9 kg (271 lb) IBW/kg (Calculated) : 79.9 Heparin Dosing Weight:   Vital Signs: Temp: 97.9 F (36.6 C) (04/19 0408) Temp Source: Oral (04/19 0735) BP: 142/95 (04/19 0735) Pulse Rate: 63 (04/19 0735)  Labs: Recent Labs    04/20/22 1150 04/22/22 1120  HGB  --  12.5*  HCT  --  38.4*  PLT  --  330  APTT  --  25  LABPROT  --  12.8  INR  --  1.0  CREATININE 0.89 0.83    Estimated Creatinine Clearance: 170.6 mL/min (by C-G formula based on SCr of 0.83 mg/dL).   Medical History: Past Medical History:  Diagnosis Date   Allergic rhinitis due to pollen 07/06/2010   Atrial fibrillation 08/15/2015   Diabetes mellitus without complication    Dyslipidemia    Hyperlipidemia 05/25/2010   Hypertension    Impaired fasting blood sugar 08/15/2015   Obesity    OSA (obstructive sleep apnea) 10/20/2015   Prediabetes    Sleep apnea    Smoker    Tobacco use disorder 11/14/2013    Medications:  Medications Prior to Admission  Medication Sig Dispense Refill Last Dose   diltiazem (CARDIZEM CD) 300 MG 24 hr capsule TAKE 1 CAPSULE BY MOUTH EVERY DAY 90 capsule 1 04/22/2022   glucose blood test strip Use as instructed 100 each 12 04/22/2022   insulin aspart (FIASP) 100 UNIT/ML FlexTouch Pen Inject 10 Units into the skin 3 (three) times daily with meals. 15 mL 1 04/21/2022   insulin glargine, 1 Unit Dial, (TOUJEO SOLOSTAR) 300 UNIT/ML Solostar Pen Inject 40 Units into the skin daily. 45 mL 3 04/21/2022   Insulin Pen Needle (BD PEN NEEDLE NANO U/F) 32G X 4 MM MISC 1 Act by Subdermal route in the morning, at noon, in the evening, and at bedtime. Use to inject insulin once daily 400 each 1 04/22/2022   metFORMIN (GLUCOPHAGE) 1000 MG tablet Take 1 tablet (1,000 mg total) by mouth 2 (two) times daily  with a meal. 180 tablet 0 04/22/2022   OneTouch UltraSoft 2 Lancets MISC 1 Units by Does not apply route 3 (three) times daily. Before meals 100 each 4 04/22/2022   rosuvastatin (CRESTOR) 40 MG tablet Take 1 tablet (40 mg total) by mouth daily. 90 tablet 1 04/22/2022   Semaglutide (RYBELSUS) 7 MG TABS Take 7 mg by mouth daily. 60 tablet 2 04/22/2022   carvedilol (COREG) 12.5 MG tablet Take 1 tablet (12.5 mg total) by mouth 2 (two) times daily with a meal. (Patient not taking: Reported on 04/22/2022) 60 tablet 3 Not Taking   nicotine polacrilex (NICORETTE MINI) 4 MG lozenge Use  three times a day to stop smoking (Patient not taking: Reported on 04/22/2022) 100 tablet 4 Not Taking   valsartan-hydrochlorothiazide (DIOVAN-HCT) 320-25 MG tablet Take 1 tablet by mouth daily. (Patient not taking: Reported on 04/22/2022) 90 tablet 3 Not Taking   Scheduled:   apixaban  5 mg Oral BID   carvedilol  12.5 mg Oral BID WC   diltiazem  300 mg Oral Daily   hydrochlorothiazide  25 mg Oral Daily   insulin aspart  0-15 Units Subcutaneous TID WC   insulin aspart  10 Units Subcutaneous TID WC   insulin glargine-yfgn  32 Units Subcutaneous Daily   irbesartan  300 mg Oral Daily  rosuvastatin  40 mg Oral Daily   Infusions:   Assessment: Pt was admitted for new CVA. He had a hx of AF but not on anticoagulation. He got DCCV previously. Apixaban ordered to be started today.   Scr<1 Cbc stable  Addendum  Plan now is to use ASA 325 and plavix for 3 months and then ASA 81 alone per Neuro.   Goal of Therapy:  Monitor platelets by anticoagulation protocol: Yes   Plan:  Resume Lovenox for DVT px Dc apixaban Plan above  Ulyses Southward, PharmD, [place], AAHIVP, CPP Infectious Disease Pharmacist 04/23/2022 11:22 AM

## 2022-04-23 NOTE — Evaluation (Signed)
Speech Language Pathology Evaluation Patient Details Name: Jesse Duncan MRN: [id number] DOB: November 29, 1985 Today's Date: 04/23/2022 Time: 4098-1191 SLP Time Calculation (min) (ACUTE ONLY): 50 min  Problem List:  Patient Active Problem List   Diagnosis Date Noted   Stroke 04/22/2022   Slurred speech 04/20/2022   Right facial numbness 04/20/2022   Abnormal electrocardiogram (ECG) (EKG) 04/08/2022   Chronic combined systolic and diastolic heart failure 09/28/2021   Tobacco use 09/28/2021   Insulin-requiring or dependent type II diabetes mellitus 06/02/2021   Primary hypertension 06/02/2021   Hyperlipidemia associated with type 2 diabetes mellitus 12/04/2018   OSA (obstructive sleep apnea) 10/20/2015   Atrial fibrillation 08/15/2015   Morbid obesity 01/28/2014   Hypogonadism male 07/12/2013   Allergic rhinitis due to pollen 07/06/2010   Past Medical History:  Past Medical History:  Diagnosis Date   Allergic rhinitis due to pollen 07/06/2010   Atrial fibrillation 08/15/2015   Diabetes mellitus without complication    Dyslipidemia    Hyperlipidemia 05/25/2010   Hypertension    Impaired fasting blood sugar 08/15/2015   Obesity    OSA (obstructive sleep apnea) 10/20/2015   Prediabetes    Sleep apnea    Smoker    Tobacco use disorder 11/14/2013   Past Surgical History: History reviewed. No pertinent surgical history. HPI:  Per MD note "Patient is a 37 y.o.  male with history of HFrEF, PAF, HTN, HLD, DM-2, tobacco use who was referred to the ED by his PCP as outpatient MRI showed acute CVA.  Patient was having right upper extremity weakness, right facial weakness and dysarthria for the past several day."   Assessment / Plan / Recommendation Clinical Impression  Pt greeted in bed, sitting upright and willing to participate in evaluation.  He endorses facial *mandibular/maxillary branch* numbness yesterday that has improved as well as waxing/waning speech difficulties. No focal CN  deficits present - and pt's phonation as well as receptive language is intact.  SLUMS *[employer] Mental Status exam completed with pt scoring 26/30 - indicative of mild cog deficits per authors :  27-30 normal, 21-26 mild disorder, 1-20 severe.  Strengths noted in areas of orientation, memory and visuospatial skills.  Functional math question challenging for pt - correctly answered with visual cues- and pt reports this to be a new change.  Expressive language deficits noted resulting in dysfluent output with novel communication due to word finding deficits.  Pt is amenable to cues including phonemic cues, sentence completion,  phrase completion, etc. Repetition at complex multisyllabic area resulted in frequent breakdown for pt.  Reviewed findings and recommendations with pt using teach back. Follow up SLP at next venue of care indicated to maximize pt's cognitive linguistic skills especially given hefty demands in his life. 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  Pt agreeable to plan.  Will follow up while pt in -house for treatment and family education.    SLP Assessment  SLP Recommendation/Assessment: Patient needs continued Speech Lanaguage Pathology Services SLP Visit Diagnosis: Aphasia (R47.01);Cognitive communication deficit (R41.841)    Recommendations for follow up therapy are one component of a multi-disciplinary discharge planning process, led by the attending physician.  Recommendations may be updated based on patient status, additional functional criteria and insurance authorization.    Follow Up Recommendations  Follow physician's recommendations for discharge plan and follow up therapies    Assistance Recommended at Discharge     Functional Status Assessment  Patient has had a recent decline in their functional status and demonstrates the ability to make significant improvements in function in a reasonable and predictable amount  of time.  Frequency and Duration min 1 x/week  1 week      SLP Evaluation Cognition  Overall Cognitive Status: Impaired/Different from baseline Arousal/Alertness: Awake/alert Orientation Level: Oriented X4 Year: 2024 Month: April Day of Week: Correct Memory: Impaired (recalled 4/5 words independently, 1/5 with category cue) Memory Impairment: Retrieval deficit Awareness: Appears intact Problem Solving: Impaired Problem Solving Impairment: Verbal complex (functional math question - difficult for pt - with use of paper/pen *written by SLP due to his right hand deficits* pt able to answer correctly) Behaviors: Other (comment) (at times pt is sighing - notably when having difficulties with tasks and he admits to being frustrated) Safety/Judgment: Appears intact       Comprehension  Auditory Comprehension Overall Auditory Comprehension: Appears within functional limits for tasks assessed Yes/No Questions: Within Functional Limits Commands: Within Functional Limits Conversation: Complex Visual Recognition/Discrimination Discrimination: Not tested Reading Comprehension Reading Status: Within funtional limits (for short paragraph level information)    Expression Expression Primary Mode of Expression: Verbal Verbal Expression Overall Verbal Expression: Impaired Initiation: Impaired Level of Generative/Spontaneous Verbalization: Sentence Repetition: Impaired Level of Impairment:  (multisyllabic and lengthy sentence) Naming: No impairment Pragmatics: No impairment Effective Techniques: Phonemic cues;Sentence completion Non-Verbal Means of Communication: Not applicable Written Expression Dominant Hand: Right Written Expression:  (DNT as pt reports he currently cannot write)   Oral / Motor  Oral Motor/Sensory Function Overall Oral Motor/Sensory Function: Within functional limits Motor Speech Overall Motor Speech: Appears within functional limits for tasks assessed Respiration:  Within functional limits Resonance: Within functional limits Articulation: Within functional limitis Intelligibility: Intelligibility reduced (complex multisyllabic level marked by minimal imprecise articulation) Motor Planning: Witnin functional limits Effective Techniques: Slow rate    Rolena Infante, MS [facility] SLP Acute Rehab Services Office [phone]         Chales Abrahams 04/23/2022, 10:08 AM

## 2022-04-23 NOTE — Evaluation (Signed)
Physical Therapy Evaluation Patient Details Name: Jesse Duncan MRN: [id number] DOB: Sep 23, 1985 Today's Date: 04/23/2022  History of Present Illness  37 y/o M admitted to [facility] on 4/18 for R arm weakness/numbness and speech problem. MRI shows early subacute large and medium sized vessel time infarct in both left MCA and left PCA. Cytotoxic edema with minor petechial hemorrhage in left occipital lobe. PMHx: PAF, HTN, chronic combined HFrEF and HFpEF, HLD, IDDM, cigarette smoking.  Clinical Impression  Pt presents today performing all mobility independently. Pt reports no concerns with weakness in legs, strength, coordination, and sensation equal in BLE. Pt able to perform transfers, ambulation, and stair negotiation independently, reporting back to his baseline. Pt performing head turns and gait speed changes/stops when cued without LOB or sway. Pt reports biggest concern is stumbling over words and RUE tingling/strength, pt with speech and OT consults already in place. At this time, pt with no further skilled acute PT needs and no current needs for PT upon discharge.        Recommendations for follow up therapy are one component of a multi-disciplinary discharge planning process, led by the attending physician.  Recommendations may be updated based on patient status, additional functional criteria and insurance authorization.  Follow Up Recommendations       Assistance Recommended at Discharge PRN  Patient can return home with the following  Assist for transportation    Equipment Recommendations None recommended by PT  Recommendations for Other Services       Functional Status Assessment Patient has not had a recent decline in their functional status     Precautions / Restrictions Precautions Precautions: Fall Restrictions Weight Bearing Restrictions: No      Mobility  Bed Mobility               General bed mobility comments: pt seated in straight legged chair upon  arrival    Transfers Overall transfer level: Independent Equipment used: None               General transfer comment: no difficulty with transfers from straight leg chair and bed    Ambulation/Gait Ambulation/Gait assistance: Independent Gait Distance (Feet): 225 Feet Assistive device: None Gait Pattern/deviations: Step-through pattern, WFL(Within Functional Limits) Gait velocity: WFL     General Gait Details: gait seems grossly WFL, no cueing, appropriate gait speed, able to perform vertical and horizontal head turns, change gait speed, and come to a stop without sway or LOB  Stairs Stairs: Yes Stairs assistance: Independent Stair Management: No rails, Alternating pattern, Forwards Number of Stairs: 12 General stair comments: no difficulty with stairs, no use of rail or LOB  Wheelchair Mobility    Modified Rankin (Stroke Patients Only) Modified Rankin (Stroke Patients Only) Pre-Morbid Rankin Score: No symptoms Modified Rankin: No significant disability     Balance Overall balance assessment: No apparent balance deficits (not formally assessed)                                           Pertinent Vitals/Pain Pain Assessment Pain Assessment: No/denies pain    Home Living Family/patient expects to be discharged to:: Private residence Living Arrangements: Children;Spouse/significant other Available Help at Discharge: Family;Available PRN/intermittently Type of Home: House Home Access: Level entry     Alternate Level Stairs-Number of Steps: 17 Home Layout: Two level;Bed/bath upstairs Home Equipment: None Additional Comments: wife works from home 2  days/week    Prior Function Prior Level of Function : Independent/Modified Independent;Working/employed;Driving             Mobility Comments: independent with all mobility, no falls, currently employed and drives       Hand Dominance   Dominant Hand: Right    Extremity/Trunk  Assessment   Upper Extremity Assessment Upper Extremity Assessment: Defer to OT evaluation    Lower Extremity Assessment Lower Extremity Assessment: Overall WFL for tasks assessed (BLE strength grossly WFL, no sensation or coordination deficits noted)    Cervical / Trunk Assessment Cervical / Trunk Assessment: Normal  Communication   Communication: Expressive difficulties (reports stumbling over his words, mild expressive difficulty noted)  Cognition Arousal/Alertness: Awake/alert Behavior During Therapy: WFL for tasks assessed/performed Overall Cognitive Status: Within Functional Limits for tasks assessed                                 General Comments: Pt A&Ox4, pt and his wife deny any cognitive changes, just report stumbling speech. Pt following commands well and no change in processing noted        General Comments General comments (skin integrity, edema, etc.): VSS on room air    Exercises     Assessment/Plan    PT Assessment Patient does not need any further PT services  PT Problem List         PT Treatment Interventions      PT Goals (Current goals can be found in the Care Plan section)  Acute Rehab PT Goals Patient Stated Goal: go home PT Goal Formulation: All assessment and education complete, DC therapy    Frequency       Co-evaluation               AM-PAC PT "6 Clicks" Mobility  Outcome Measure Help needed turning from your back to your side while in a flat bed without using bedrails?: None Help needed moving from lying on your back to sitting on the side of a flat bed without using bedrails?: None Help needed moving to and from a bed to a chair (including a wheelchair)?: None Help needed standing up from a chair using your arms (e.g., wheelchair or bedside chair)?: None Help needed to walk in hospital room?: None Help needed climbing 3-5 steps with a railing? : None 6 Click Score: 24    End of Session Equipment Utilized  During Treatment: Gait belt Activity Tolerance: Patient tolerated treatment well Patient left: in chair;with family/visitor present;with call bell/phone within reach Nurse Communication: Mobility status PT Visit Diagnosis: Other abnormalities of gait and mobility (R26.89)    Time: 9604-5409 PT Time Calculation (min) (ACUTE ONLY): 9 min   Charges:   PT Evaluation $PT Eval Low Complexity: 1 Low          Lindalou Hose, PT DPT Acute Rehabilitation Services Office [phone]   Leonie Man 04/23/2022, 1:10 PM

## 2022-04-23 NOTE — Inpatient Diabetes Management (Signed)
Inpatient Diabetes Program Recommendations  AACE/ADA: New Consensus Statement on Inpatient Glycemic Control (2015)  Target Ranges:  Prepandial:   less than 140 mg/dL      Peak postprandial:   less than 180 mg/dL (1-2 hours)      Critically ill patients:  140 - 180 mg/dL   Lab Results  Component Value Date   GLUCAP 224 (H) 04/23/2022   HGBA1C 14.3 (H) 04/08/2022    Review of Glycemic Control  Diabetes history: type 2 Outpatient Diabetes medications: Toujeo 40 units daily, Fiasp 10 units TID, Metformin 1000 mg BID, Rybelsus 7 mg daily Current orders for Inpatient glycemic control: Semglee 32 units daily, Novolog 0-15 units correction scale TID, Novolog 10 units TID  Inpatient Diabetes Program Recommendations:   Spoke with patient and wife about his diabetes. States that he was diagnosed about 3-4 years ago. Discussed his HgbA1C of 14.3%. States that he had just started taking all his medications everyday for about 3 weeks. He had been taking them of and on before. Discussed normal blood sugar, checking blood sugars at home with meter, and where his HgbA1C should be.   Discussed the plate method for eating, cutting back on sweet tea, sodas, juices and trying to covert to diet over the next few weeks. States that he works for [employer] and does a lot of walking. States that he needs to cut back on fast food and eat healthier foods from home.   Will order Living Well with Diabetes booklet, exit notes on diabetes. Will need to follow up with PCP after discharge.   Smith Mince RN BSN CDE Diabetes Coordinator Pager: [phone]  8am-5pm

## 2022-04-23 NOTE — Progress Notes (Signed)
  Echocardiogram 2D Echocardiogram has been performed.  Jesse Duncan Wynn Banker 04/23/2022, 3:11 PM

## 2022-04-23 NOTE — Progress Notes (Addendum)
PROGRESS NOTE        PATIENT DETAILS Name: Jesse Duncan Age: 37 y.o. Sex: male Date of Birth: 1985/02/08 Admit Date: 04/22/2022 Admitting Physician Emeline General, MD [id number], Bernadene Bell, MD  Brief Summary: Patient is a 37 y.o.  male with history of HFrEF, PAF, HTN, HLD, DM-2, tobacco use who was referred to the ED by his PCP as outpatient MRI showed acute CVA.  Patient was having right upper extremity weakness, right facial weakness and dysarthria for the past several days.  Significant events: 4/18>> admit to [facility]  Significant studies: 4/04>> A1c: 14.3 4/18>> MRI brain: Acute infarct left MCA and left PCA territories with cytotoxic edema  4/18>> CTA head/neck: Occlusion left P2 PCA, left M2 MCA.  Severe proximal left intradural vertebral artery stenosis.  Moderate right M1 MCA stenosis.   4/19>>LDL:189  Significant microbiology data: None  Procedures: None  Consults: Neurology  Subjective: Slightly dysarthric-continues to have significant RUE weakness-some mild right facial weakness.  Objective: Vitals: Blood pressure (!) 142/95, pulse 63, temperature 97.9 F (36.6 C), temperature source Oral, resp. rate 12, height  (1.854 m), weight 122.9 kg, SpO2 98 %.   Exam: Gen Exam:Alert awake-not in any distress HEENT:atraumatic, normocephalic Chest: B/L clear to auscultation anteriorly CVS:S1S2 regular Abdomen:soft non tender, non distended Extremities:no edema Neurology: Dysarthric-RUE 3-4/5, slight right facial weakness. Skin: no rash  Pertinent Labs/Radiology:    Latest Ref Rng & Units 04/22/2022   11:20 AM 04/08/2022    6:24 PM 04/08/2022    3:33 PM  CBC  WBC 4.0 - 10.5 K/uL 6.4  8.5  5.4   Hemoglobin 13.0 - 17.0 g/dL 46.9  62.9  52.8   Hematocrit 39.0 - 52.0 % 38.4  44.9  41.8   Platelets 150 - 400 K/uL 330  344  287.0     Lab Results  Component Value Date   NA 135 04/22/2022   K 3.6 04/22/2022   CL 99 04/22/2022   CO2 25  04/22/2022     Assessment/Plan: Acute CVA Unchanged mild dysarthria/mild right facial weakness and RUE weakness Unclear whether this is thromboembolic stroke from intracranial atherosclerosis or embolic due to HFrEF/A-fib (telemetry negative for A-fib overnight). Workup in process-awaiting echo Await input from PT/OT Currently on aspirin/Plavix/statin Await further recommendations from stroke MD.  PAF Remote history approximately 7 years back-apparently required cardioversion.  Not on anticoagulation prior to this admit Maintaining sinus rhythm currently Continue telemetry monitoring Await echo-await recommendations from stroke MD regarding anticoagulation.  Chronic HFrEF Euvolemic Await echo  HTN  Outside of window for permissive hypertension as stroke happened several days back BP stable with Coreg/diltiazem/HCTZ/Avapro  HLD Noncompliant with Crestor Counseled Resume Crestor  DM-2-with uncontrolled hyperglycemia Continue Lantus 32 units daily, NovoLog 10 units with meals and SSI Needs better outpatient glycemic control given persistently elevated A1c  Recent Labs    04/22/22 1648 04/23/22 0022 04/23/22 0737  GLUCAP 204* 286* 224*    Tobacco abuse Counseled Transdermal nicotine  Obesity: Estimated body mass index is 35.75 kg/m as calculated from the following:   Height as of this encounter:  (1.854 m).   Weight as of this encounter: 122.9 kg.   Code status:   Code Status: Full Code   DVT Prophylaxis: enoxaparin (LOVENOX) injection 40 mg Start: 04/22/22 2000   Family Communication: None at bedside   Disposition  Plan: Status is: Inpatient Remains inpatient appropriate because: Severity of illness   Planned Discharge Destination:Home   Diet: Diet Order             Diet heart healthy/carb modified Room service appropriate? Yes; Fluid consistency: Thin  Diet effective now                     Antimicrobial agents: Anti-infectives  (From admission, onward)    None        MEDICATIONS: Scheduled Meds:  carvedilol  12.5 mg Oral BID WC   diltiazem  300 mg Oral Daily   enoxaparin (LOVENOX) injection  40 mg Subcutaneous Q24H   hydrochlorothiazide  25 mg Oral Daily   insulin aspart  0-15 Units Subcutaneous TID WC   insulin aspart  10 Units Subcutaneous TID WC   insulin glargine-yfgn  32 Units Subcutaneous Daily   irbesartan  300 mg Oral Daily   rosuvastatin  40 mg Oral Daily   Continuous Infusions: PRN Meds:.acetaminophen **OR** acetaminophen (TYLENOL) oral liquid 160 mg/5 mL **OR** acetaminophen, nicotine polacrilex, senna-docusate   I have personally reviewed following labs and imaging studies  LABORATORY DATA: CBC: Recent Labs  Lab 04/22/22 1120  WBC 6.4  NEUTROABS 3.2  HGB 12.5*  HCT 38.4*  MCV 81.9  PLT 330    Basic Metabolic Panel: Recent Labs  Lab 04/20/22 1150 04/22/22 1120  NA 135 135  K 3.9 3.6  CL 99 99  CO2 30 25  GLUCOSE 266* 229*  BUN 12 8  CREATININE 0.89 0.83  CALCIUM 9.5 9.9    GFR: Estimated Creatinine Clearance: 170.6 mL/min (by C-G formula based on SCr of 0.83 mg/dL).  Liver Function Tests: Recent Labs  Lab 04/22/22 1120  AST 22  ALT 14  ALKPHOS 71  BILITOT 1.5*  PROT 7.3  ALBUMIN 3.8   No results for input(s): "LIPASE", "AMYLASE" in the last 168 hours. No results for input(s): "AMMONIA" in the last 168 hours.  Coagulation Profile: Recent Labs  Lab 04/22/22 1120  INR 1.0    Cardiac Enzymes: No results for input(s): "CKTOTAL", "CKMB", "CKMBINDEX", "TROPONINI" in the last 168 hours.  BNP (last 3 results) Recent Labs    04/08/22 1533  PROBNP 30.0    Lipid Profile: Recent Labs    04/23/22 0345  CHOL 288*  HDL 36*  LDLCALC 189*  TRIG 313*  CHOLHDL 8.0    Thyroid Function Tests: No results for input(s): "TSH", "T4TOTAL", "FREET4", "T3FREE", "THYROIDAB" in the last 72 hours.  Anemia Panel: No results for input(s): "VITAMINB12",  "FOLATE", "FERRITIN", "TIBC", "IRON", "RETICCTPCT" in the last 72 hours.  Urine analysis:    Component Value Date/Time   COLORURINE YELLOW 04/08/2022 1824   APPEARANCEUR CLEAR 04/08/2022 1824   LABSPEC 1.030 04/08/2022 1824   PHURINE 5.0 04/08/2022 1824   GLUCOSEU >=500 (A) 04/08/2022 1824   GLUCOSEU >=1000 (A) 04/08/2022 1533   HGBUR NEGATIVE 04/08/2022 1824   BILIRUBINUR NEGATIVE 04/08/2022 1824   BILIRUBINUR n 12/23/2014 1218   KETONESUR NEGATIVE 04/08/2022 1824   PROTEINUR NEGATIVE 04/08/2022 1824   UROBILINOGEN 1.0 04/08/2022 1533   NITRITE NEGATIVE 04/08/2022 1824   LEUKOCYTESUR NEGATIVE 04/08/2022 1824    Sepsis Labs: Lactic Acid, Venous No results found for: "LATICACIDVEN"  MICROBIOLOGY: No results found for this or any previous visit (from the past 240 hour(s)).  RADIOLOGY STUDIES/RESULTS: CT ANGIO HEAD NECK W WO CM  Addendum Date: 04/22/2022   ADDENDUM REPORT: 04/22/2022 14:08 ADDENDUM: Findings discussed with  Dr. Dalene Seltzer via telephone at 2 p.m. Electronically Signed   By: Feliberto Harts M.D.   On: 04/22/2022 14:08   Result Date: 04/22/2022 CLINICAL DATA:  Neuro deficit, acute, stroke suspected EXAM: CT ANGIOGRAPHY HEAD AND NECK WITH AND WITHOUT CONTRAST TECHNIQUE: Multidetector CT imaging of the head and neck was performed using the standard protocol during bolus administration of intravenous contrast. Multiplanar CT image reconstructions and MIPs were obtained to evaluate the vascular anatomy. Carotid stenosis measurements (when applicable) are obtained utilizing NASCET criteria, using the distal internal carotid diameter as the denominator. RADIATION DOSE REDUCTION: This exam was performed according to the departmental dose-optimization program which includes automated exposure control, adjustment of the mA and/or kV according to patient size and/or use of iterative reconstruction technique. CONTRAST:  75mL OMNIPAQUE IOHEXOL 350 MG/ML SOLN COMPARISON:  MRI head  same day. FINDINGS: CT HEAD FINDINGS Brain: Left MCA and PCA territory infarcts, better characterized on same day MRI. No mass occupying acute hemorrhage or midline shift. No hydrocephalus. No mass lesion or extra-axial fluid collection. Vascular: See below. Skull: No acute fracture. Sinuses/Orbits: Largely clear sinuses.  No acute orbital findings. Other: No mastoid effusions. Review of the MIP images confirms the above findings CTA NECK FINDINGS Aortic arch: Great vessel origins are patent without significant stenosis. Right carotid system: No evidence of dissection, stenosis (50% or greater), or occlusion. Left carotid system: No evidence of dissection, stenosis (50% or greater), or occlusion. Vertebral arteries: Codominant. No evidence of dissection, stenosis (50% or greater), or occlusion. Skeleton: No acute fracture on limited assessment. Other neck: No acute abnormality on limited assessment. Upper chest: Visualized lung apices are clear. Review of the MIP images confirms the above findings CTA HEAD FINDINGS Anterior circulation: Bilateral intracranial ICAs are patent. Severe stenosis of proximal left M2 MCA stenosis. Moderate right M1 MCA stenosis. Posterior circulation: Severe stenosis of the proximal intradural vertebral artery, likely due to atherosclerosis. The basilar artery and right posterior cerebral artery are patent. Occlusion of the distal left P2 PCA. Venous sinuses: As permitted by contrast timing, patent. Review of the MIP images confirms the above findings IMPRESSION: 1. Occlusion of the distal left P2 PCA. 2. Severe left M2 MCA stenosis. 3. Severe proximal left intradural vertebral artery stenosis. 4. Moderate right M1 MCA stenosis. Electronically Signed: By: Feliberto Harts M.D. On: 04/22/2022 13:55   MR Brain Wo Contrast  Result Date: 04/22/2022 CLINICAL DATA:  37 year old male "slurred speech, numbness and tingling, right facial numbness". Symptom onset reportedly began last week.  History of hypertension and diabetes. EXAM: MRI HEAD WITHOUT CONTRAST TECHNIQUE: Multiplanar, multiecho pulse sequences of the brain and surrounding structures were obtained without intravenous contrast. COMPARISON:  No prior brain imaging. FINDINGS: Brain: Relatively large, roughly 5 cm area of confluent restricted diffusion in the left occipital pole, affecting the inferior left parietal lobe (left PCA territory). Left thalamus and temporal lobe appear relatively spared, but there is a roughly 1 cm area of diffusion restriction also in the splenium of the corpus callosum. Furthermore, there is similar patchy, scattered gyral and subcortical white matter diffusion restriction in the left middle frontal gyrus (middle division left MCA territory). Some of the left insula and inferior frontal gyrus also affected. No restricted diffusion identified in the right hemisphere or the posterior fossa. T2 hyperintense cytotoxic edema in the affected areas. Mild petechial hemorrhage in the left occipital pole on SWI (series 11, image 31). No other convincing blood products. No superimposed midline shift, ventriculomegaly, extra-axial collection. Left hemisphere  and posterior fossa gray and white matter signal appears to remain normal. Cervicomedullary junction and pituitary are within normal limits. Vascular: Major intracranial vascular flow voids are preserved. Skull and upper cervical spine: Negative visible cervical spine. Visualized bone marrow signal is within normal limits. Sinuses/Orbits: Rightward gaze. Otherwise negative orbits soft tissues. Scattered paranasal sinus mucosal thickening is mild. Other: Mastoids appear clear. Grossly normal visible internal auditory structures. Negative visible scalp and face. IMPRESSION: 1. Positive for acute to early subacute large or medium-sized vessel type infarcts in both the Left MCA and Left PCA territories. Cytotoxic edema with minor petechial hemorrhage in the left occipital  lobe. No malignant hemorrhagic transformation or intracranial mass effect. No ischemia in the right hemisphere or posterior fossa, which argues against a recent cardiac embolic event. 2. Study discussed by telephone with Dr. Sanda Linger on 04/22/2022 at 1012 hours. And we are arranging for the patient to present to the [facility] for additional Stroke care and workup. Electronically Signed   By: Odessa Fleming M.D.   On: 04/22/2022 10:23     LOS: 1 day   Jeoffrey Massed, MD  [facility]    To contact the attending provider between 7A-7P or the covering provider during after hours 7P-7A, please log into the web site [email or web address] and access using universal [place] password for that web site. If you do not have the password, please call the hospital operator.  04/23/2022, 9:19 AM

## 2022-04-23 NOTE — Progress Notes (Addendum)
STROKE TEAM PROGRESS NOTE   INTERVAL HISTORY His wife is at the bedside.  He presented yesterday for acute onset of slurred speech right upper extremity weakness numbness and vision problems MRI revealed acute/subacute infarcts in left MCA and left PCA minimal petechial hemorrhage and cytotoxic edema in the left occipital lobe CTA with left P2 PCA occlusion, severe left M2 stenosis, severe left intradural vertebral artery stenosis, moderate right M1 stenosis  Will put him on aspirin  and Plavix 75 mg for 3 months then 8 aspirin 81 mg daily.  Recommending 30-day heart monitor.  He endorses not being compliant with his medication Educated patient on importance of being compliant with his medications and controlling his risk factors of hyperlipidemia, hypertension, and diabetes as well as quitting smoking  Vitals:   04/23/22 0408 04/23/22 0735 04/23/22 1145 04/23/22 1208  BP: (!) 151/83 (!) 142/95    Pulse: 61 63    Resp: 12     Temp: 97.9 F (36.6 C)  98.1 F (36.7 C) 98.1 F (36.7 C)  TempSrc: Oral Oral Oral Oral  SpO2:      Weight:      Height:       CBC:  Recent Labs  Lab 04/22/22 1120  WBC 6.4  NEUTROABS 3.2  HGB 12.5*  HCT 38.4*  MCV 81.9  PLT 330   Basic Metabolic Panel:  Recent Labs  Lab 04/20/22 1150 04/22/22 1120  NA 135 135  K 3.9 3.6  CL 99 99  CO2 30 25  GLUCOSE 266* 229*  BUN 12 8  CREATININE 0.89 0.83  CALCIUM 9.5 9.9   Lipid Panel:  Recent Labs  Lab 04/23/22 0345  CHOL 288*  TRIG 313*  HDL 36*  CHOLHDL 8.0  VLDL 63*  LDLCALC 189*   HgbA1c: No results for input(s): "HGBA1C" in the last 168 hours. Urine Drug Screen: No results for input(s): "LABOPIA", "COCAINSCRNUR", "LABBENZ", "AMPHETMU", "THCU", "LABBARB" in the last 168 hours.  Alcohol Level  Recent Labs  Lab 04/22/22 1120  ETH <10    IMAGING past 24 hours No results found.  PHYSICAL EXAM  Temp:  [97.8 F (36.6 C)-98.4 F (36.9 C)] 98.1 F (36.7 C) (04/19 1208) Pulse  Rate:  [61-67] 63 (04/19 0735) Resp:  [12-23] 12 (04/19 0408) BP: (136-156)/(69-99) 142/95 (04/19 0735) SpO2:  [98 %-100 %] 98 % (04/18 2000)  General - Well nourished, well developed, in no apparent distress. Cardiovascular - Regular rhythm and rate.  Mental Status -  Level of arousal and orientation to time, place, and person were intact. Language including expression, naming, repetition, comprehension was assessed and found intact. Attention span and concentration were normal. Recent and remote memory were intact. Fund of Knowledge was assessed and was intact.  Cranial Nerves II - XII - II - Visual field right hemianopia III, IV, VI - Extraocular movements intact. V - Facial sensation intact bilaterally. VII - Facial movement intact bilaterally. VIII - Hearing & vestibular intact bilaterally. X - Palate elevates symmetrically. XI - Chin turning & shoulder shrug intact bilaterally. XII - Tongue protrusion intact.  Motor Strength right arm 4 out of 5, right grip 4 out of 5 left arm 5 out of 5, left grip 5 out of 5 left leg 5 out of 5 Motor Tone - Muscle tone was assessed at the neck and appendages and was normal.  Sensory -decreased on right arm  Coordination -mild ataxia  Gait and Station - deferred.  ASSESSMENT/PLAN Mr. Lamark Schue is  a 37 y.o. male with history of  PAF, HTN, chronic combined HFrEF and HFpEF, HLD, IDDM, cigarette smoking, presented with new onset of right arm weakness numbness and slurred speech.  He endorses not being compliant with his medication  Stroke:  acute/subacute infarcts in left MCA and left PCA, likely large vessel disease from left P2 occlusion and severe left M2 stenosis from uncontrolled risk factors  MRI  Positive for acute to early subacute large or medium-sized vessel type infarcts in both the Left MCA and Left PCA territories. Cytotoxic edema with minor petechial hemorrhage in the left occipital lobe. No malignant hemorrhagic  transformation or intracranial mass effect. CTA head & neck Occlusion of the distal left P2 PCA. Severe left M2 MCA stenosis. Severe proximal left intradural vertebral artery stenosis. Moderate right M1 MCA stenosis. 2D Echo EF 40 to 45% LDL 189 HgbA1c 14.3 VTE prophylaxis - SCD's No antithrombotic prior to admission, now on aspirin 325 mg daily and clopidogrel 75 mg daily given intracranial stenosis/occlusion.  For 3 months then 81 mg aspirin daily Therapy recommendations: None Disposition: Pending  Hypertension Home meds: Cardizem 300 mg, Coreg 12.5 mg, HCTZ 12.5 mg, Avapro 300 mg Stable On Coreg 12.5, Cardizem, HCTZ 25, Avapro Long-term BP goal normotensive  ? [place] A fib  Remote history ~ 7 years ago after heavy energy drink binge drinking, required cardioversion, was on Xarelto -> discontinued No symptomatic recurrence per pt Continue tele  Outpt 30 day cardiac event monitoring to rule out ongoing PAF Continue to follow up with cardiology as outpt  Hyperlipidemia Home meds: Crestor 40 mg, noncompliant  LDL 189, goal < 70 Resume home Crestor 40 Continue statin at discharge  Diabetes type II UnControlled Home meds: Metformin, insulin HgbA1c 14.3, goal < 7.0 CBGs SSI Patient diabetes management consult Will need close PCP follow-up for diabetes control  Tobacco abuse Current smoker Smoking cessation counseling provided Nicotine patch provided Pt is willing to quit  Other Stroke Risk Factors ETOH use, alcohol level <10, advised to drink no more than 1 drink(s) a day Obesity, Body mass index is 35.75 kg/m., BMI >/= 30 associated with increased stroke risk, recommend weight loss, diet and exercise as appropriate  [facility]  Hospital day # 1  Gevena Mart DNP, ACNPC-AG  [facility]  ATTENDING NOTE: I reviewed above note and agree with the assessment and plan. Pt was seen and examined.   Wife at bedside.  Patient lying in bed, still has right hemianopia and  right hand dexterity.  Otherwise no significant neurodeficit.  MRI showed left MCA and PCA territory scattered infarcts, etiology likely multifocal large vessel disease.  However, patient does have history of A-fib 6-7 years ago after energy drink binge drinking and status post cardioversion, was on Xarelto.  But with reported no recurrence, Xarelto discontinued.  Patient denies any symptomatic episodes.  Will recommend 30-day cardiac event monitor as outpatient to rule out A-fib.  Continue DAPT for 3 months and then aspirin alone.  Resume home Crestor 40.  Aggressive risk factor modification, better DM control and quit smoking.  PT/OT no recommendation.  For detailed assessment and plan, please refer to above/below as I have made changes wherever appropriate.   Neurology will sign off. Please call with questions. Pt will follow up with stroke clinic NP at [facility] in about 4 weeks. Thanks for the consult.   Marvel Plan, MD PhD Stroke Neurology 04/23/2022 11:16 PM     To contact Stroke Continuity provider, please refer to [email or web address].  After hours, contact General Neurology

## 2022-04-23 NOTE — Discharge Summary (Signed)
PATIENT DETAILS Name: Jesse Duncan Age: 37 y.o. Sex: male Date of Birth: [birth year] MRN: [id number]. Admitting Physician: Emeline General, MD WUJ:WJXBJ, Bernadene Bell, MD  Admit Date: 04/22/2022 Discharge date: 04/23/2022  Recommendations for Outpatient Follow-up:  Follow up with PCP in 1-2 weeks Please obtain CMP/CBC in one week Please ensure follow-up with neurology and cardiology.  Admitted From:  Home  Disposition: Home   Discharge Condition: good  CODE STATUS:   Code Status: Full Code   Diet recommendation:  Diet Order             Diet - low sodium heart healthy           Diet Carb Modified           Diet heart healthy/carb modified Room service appropriate? Yes; Fluid consistency: Thin  Diet effective now                    Brief Summary: Patient is a 37 y.o.  male with history of HFrEF, PAF, HTN, HLD, DM-2, tobacco use who was referred to the ED by his PCP as outpatient MRI showed acute CVA.  Patient was having right upper extremity weakness, right facial weakness and dysarthria for the past several days.   Significant events: 4/18>> admit to [facility]   Significant studies: 4/04>> A1c: 14.3 4/18>> MRI brain: Acute infarct left MCA and left PCA territories with cytotoxic edema  4/18>> CTA head/neck: Occlusion left P2 PCA, left M2 MCA.  Severe proximal left intradural vertebral artery stenosis.  Moderate right M1 MCA stenosis.   4/19>>LDL:189 4/19>>TTE:EF 40-45%   Significant microbiology data: None   Procedures: None   Consults: Neurology  Brief Hospital Course: Acute CVA Unchanged mild dysarthria/mild right facial weakness and RUE weakness Workup as above-discussed with neurology-Dr Xu-felt to be due to intracraiial stenosis, recommendations for ASA/plavix for 3 months, followed by ASA alone. Cardiology team has been requested to arrange to outpatient 30 day monitor. Noncompliant to statin in the past-continue Crestor-current dose Ensure  outpatient follow-up with neurology.   PAF Remote history approximately 7 years back-apparently required cardioversion.  Not on anticoagulation prior to this admit Maintaining sinus rhythm currently.  Reached out to cardiology team-Patricia Trent-they will arrange for outpatient follow-up and outpatient cardiac monitor  Chronic HFrEF Euvolemic   HTN  Outside of window for permissive hypertension as stroke happened several days back BP stable with Coreg/diltiazem/HCTZ/Avapro   HLD Noncompliant with Crestor Counseled Resume Crestor   DM-2-with uncontrolled hyperglycemia Continue Lantus 32 units daily, NovoLog 10 units with meals and SSI Needs better outpatient glycemic control given persistently elevated A1c  Obesity: Estimated body mass index is 35.75 kg/m as calculated from the following:   Height as of this encounter: 6\' 1"  (1.854 m).   Weight as of this encounter: 122.9 kg.    Discharge Diagnoses:  Principal Problem:   Stroke Active Problems:   Atrial fibrillation   Hyperlipidemia associated with type 2 diabetes mellitus   Tobacco use   Discharge Instructions:  Activity:  As tolerated with Full fall precautions use walker/cane & assistance as needed   Discharge Instructions     Ambulatory referral to Neurology   Complete by: As directed    An appointment is requested in approximately: 8 weeks   Call MD for:  extreme fatigue   Complete by: As directed    Call MD for:  persistant dizziness or light-headedness   Complete by: As directed    Diet - low sodium  heart healthy   Complete by: As directed    Diet Carb Modified   Complete by: As directed    Discharge instructions   Complete by: As directed    Follow with Primary MD  [name], MD in 1-2 weeks  Please follow-up with stroke clinic as instructed Follow-up with cardiology as instructed  Please keep a record of your CBGs and take it to your next appointment with your primary care  practitioner  Please take all your medications including insulin/statin as prescribed.  Please get a complete blood count and chemistry panel checked by your Primary MD at your next visit, and again as instructed by your Primary MD.  Get Medicines reviewed and adjusted: Please take all your medications with you for your next visit with your Primary MD  Laboratory/radiological data: Please request your Primary MD to go over all hospital tests and procedure/radiological results at the follow up, please ask your Primary MD to get all Hospital records sent to his/her office.  In some cases, they will be blood work, cultures and biopsy results pending at the time of your discharge. Please request that your primary care M.D. follows up on these results.  Also Note the following: If you experience worsening of your admission symptoms, develop shortness of breath, life threatening emergency, suicidal or homicidal thoughts you must seek medical attention immediately by calling 911 or calling your MD immediately  if symptoms less severe.  You must read complete instructions/literature along with all the possible adverse reactions/side effects for all the Medicines you take and that have been prescribed to you. Take any new Medicines after you have completely understood and accpet all the possible adverse reactions/side effects.   Do not drive when taking Pain medications or sleeping medications (Benzodaizepines)  Do not take more than prescribed Pain, Sleep and Anxiety Medications. It is not advisable to combine anxiety,sleep and pain medications without talking with your primary care practitioner  Special Instructions: If you have smoked or chewed Tobacco  in the last 2 yrs please stop smoking, stop any regular Alcohol  and or any Recreational drug use.  Wear Seat belts while driving.  Please note: You were cared for by a hospitalist during your hospital stay. Once you are discharged, your primary  care physician will handle any further medical issues. Please note that NO REFILLS for any discharge medications will be authorized once you are discharged, as it is imperative that you return to your primary care physician (or establish a relationship with a primary care physician if you do not have one) for your post hospital discharge needs so that they can reassess your need for medications and monitor your lab values.   Increase activity slowly   Complete by: As directed       Allergies as of 04/23/2022   No Known Allergies      Medication List     TAKE these medications    aspirin EC 325 MG tablet Take 1 tablet (325 mg total) by mouth daily. Swallow whole. Take with Plavix for 90 days, after 90 stop plavix and continue on Aspirin   BD Pen Needle Nano U/F 32G X 4 MM Misc Generic drug: Insulin Pen Needle 1 Act by Subdermal route in the morning, at noon, in the evening, and at bedtime. Use to inject insulin once daily   carvedilol 12.5 MG tablet Commonly known as: COREG Take 1 tablet (12.5 mg total) by mouth 2 (two) times daily with a meal.  clopidogrel 75 MG tablet Commonly known as: Plavix Take 1 tablet (75 mg total) by mouth daily. Take along with aspirin for 90 days and then stop Plavix, but continue on ASA   diltiazem 300 MG 24 hr capsule Commonly known as: CARDIZEM CD TAKE 1 CAPSULE BY MOUTH EVERY DAY   glucose blood test strip Use as instructed   insulin aspart 100 UNIT/ML FlexTouch Pen Commonly known as: FIASP Inject 10 Units into the skin 3 (three) times daily with meals.   metFORMIN 1000 MG tablet Commonly known as: GLUCOPHAGE Take 1 tablet (1,000 mg total) by mouth 2 (two) times daily with a meal.   nicotine polacrilex 4 MG lozenge Commonly known as: Nicorette Mini Use 4mg  three times a day to stop smoking   OneTouch UltraSoft 2 Lancets Misc 1 Units by Does not apply route 3 (three) times daily. Before meals   pantoprazole 40 MG tablet Commonly  known as: Protonix Take 1 tablet (40 mg total) by mouth daily.   rosuvastatin 40 MG tablet Commonly known as: CRESTOR Take 1 tablet (40 mg total) by mouth daily.   Rybelsus 7 MG Tabs Generic drug: Semaglutide Take 7 mg by mouth daily.   Toujeo SoloStar 300 UNIT/ML Solostar Pen Generic drug: insulin glargine (1 Unit Dial) Inject 40 Units into the skin daily.   valsartan-hydrochlorothiazide 320-25 MG tablet Commonly known as: DIOVAN-HCT Take 1 tablet by mouth daily.         Follow-up Information     [name], MD. Schedule an appointment as soon as possible for a visit in 1 week(s).   Specialty: Internal Medicine Contact information: [address] [place] [address] [phone]         [facility] Follow up.   Specialty: Neurology Why: Office will call with date/time, If you dont hear from them,please give them a call Contact information: [address] [place] [address] [phone]               No Known Allergies   Other Procedures/Studies: ECHOCARDIOGRAM COMPLETE  Result Date: 04/23/2022    ECHOCARDIOGRAM REPORT   Patient Name:   Jesse Duncan Date of Exam: 04/23/2022 Medical Rec #:  [id number]          Height:       73.0 in Accession #:    [id number]         Weight:       271.0 lb Date of Birth:  1985/05/23          BSA:          2.448 m Patient Age:    36 years           BP:           124/73 mmHg Patient Gender: M                  HR:           63 bpm. Exam Location:  Inpatient Procedure: 2D Echo, Cardiac Doppler and Color Doppler Indications:    Stroke  History:        Patient has prior history of Echocardiogram examinations, most                 recent 08/04/2015. Stroke, Arrythmias:Atrial Fibrillation; Risk                 Factors:Diabetes, Current Smoker and HLD.  Sonographer:    Lucy Antigua Referring  Phys: [id number] Dewayne Shorter M Jesse Duncan IMPRESSIONS  1. Left ventricular ejection  fraction, by estimation, is 40 to 45%. The left ventricle has mildly decreased function. The left ventricle demonstrates global hypokinesis. There is mild concentric left ventricular hypertrophy. Left ventricular diastolic parameters are consistent with Grade I diastolic dysfunction (impaired relaxation).  2. Right ventricular systolic function is normal. The right ventricular size is normal. Tricuspid regurgitation signal is inadequate for assessing PA pressure.  3. The mitral valve is normal in structure. No evidence of mitral valve regurgitation. No evidence of mitral stenosis.  4. The aortic valve is tricuspid. Aortic valve regurgitation is not visualized. No aortic stenosis is present.  5. The inferior vena cava is normal in size with greater than 50% respiratory variability, suggesting right atrial pressure of 3 mmHg. FINDINGS  Left Ventricle: Left ventricular ejection fraction, by estimation, is 40 to 45%. The left ventricle has mildly decreased function. The left ventricle demonstrates global hypokinesis. The left ventricular internal cavity size was normal in size. There is  mild concentric left ventricular hypertrophy. Left ventricular diastolic parameters are consistent with Grade I diastolic dysfunction (impaired relaxation). Right Ventricle: The right ventricular size is normal. No increase in right ventricular wall thickness. Right ventricular systolic function is normal. Tricuspid regurgitation signal is inadequate for assessing PA pressure. Left Atrium: Left atrial size was normal in size. Right Atrium: Right atrial size was normal in size. Pericardium: There is no evidence of pericardial effusion. Mitral Valve: The mitral valve is normal in structure. Mild mitral annular calcification. No evidence of mitral valve regurgitation. No evidence of mitral valve stenosis. Tricuspid Valve: The tricuspid valve is normal in structure. Tricuspid valve regurgitation is not demonstrated. Aortic Valve: The aortic  valve is tricuspid. Aortic valve regurgitation is not visualized. No aortic stenosis is present. Aortic valve mean gradient measures 3.0 mmHg. Aortic valve peak gradient measures 6.7 mmHg. Aortic valve area, by VTI measures 3.86 cm. Pulmonic Valve: The pulmonic valve was normal in structure. Pulmonic valve regurgitation is not visualized. Aorta: The aortic root is normal in size and structure. Venous: The inferior vena cava is normal in size with greater than 50% respiratory variability, suggesting right atrial pressure of 3 mmHg. IAS/Shunts: No atrial level shunt detected by color flow Doppler.  LEFT VENTRICLE PLAX 2D LVIDd:         5.40 cm   Diastology LVIDs:         3.80 cm   LV e' medial:    5.13 cm/s LV PW:         1.40 cm   LV E/e' medial:  11.7 LV IVS:        1.20 cm   LV e' lateral:   8.24 cm/s LVOT diam:     2.60 cm   LV E/e' lateral: 7.3 LV SV:         83 LV SV Index:   34 LVOT Area:     5.31 cm  RIGHT VENTRICLE RV S prime:     15.20 cm/s TAPSE (M-mode): 2.2 cm LEFT ATRIUM             Index        RIGHT ATRIUM           Index LA Vol (A2C):   25.5 ml 10.42 ml/m  RA Area:     15.30 cm LA Vol (A4C):   30.6 ml 12.50 ml/m  RA Volume:   37.30 ml  15.24 ml/m LA Biplane Vol: 29.9 ml 12.21  ml/m  AORTIC VALVE AV Area (Vmax):    3.44 cm AV Area (Vmean):   3.42 cm AV Area (VTI):     3.86 cm AV Vmax:           129.00 cm/s AV Vmean:          84.400 cm/s AV VTI:            0.216 m AV Peak Grad:      6.7 mmHg AV Mean Grad:      3.0 mmHg LVOT Vmax:         83.60 cm/s LVOT Vmean:        54.400 cm/s LVOT VTI:          0.157 m LVOT/AV VTI ratio: 0.73  AORTA Ao Root diam: 3.40 cm Ao Asc diam:  2.80 cm MITRAL VALVE MV Area (PHT): 2.76 cm    SHUNTS MV Decel Time: 275 msec    Systemic VTI:  0.16 m MV E velocity: 59.80 cm/s  Systemic Diam: 2.60 cm MV A velocity: 58.60 cm/s MV E/A ratio:  1.02 Jesse McleanMD Electronically signed by Wilfred Lacy Signature Date/Time: 04/23/2022/4:10:48 PM    Final    CT ANGIO HEAD  NECK W WO CM  Addendum Date: 04/22/2022   ADDENDUM REPORT: 04/22/2022 14:08 ADDENDUM: Findings discussed with Dr. Dalene Seltzer via telephone at 2 p.m. Electronically Signed   By: Feliberto Harts M.D.   On: 04/22/2022 14:08   Result Date: 04/22/2022 CLINICAL DATA:  Neuro deficit, acute, stroke suspected EXAM: CT ANGIOGRAPHY HEAD AND NECK WITH AND WITHOUT CONTRAST TECHNIQUE: Multidetector CT imaging of the head and neck was performed using the standard protocol during bolus administration of intravenous contrast. Multiplanar CT image reconstructions and MIPs were obtained to evaluate the vascular anatomy. Carotid stenosis measurements (when applicable) are obtained utilizing NASCET criteria, using the distal internal carotid diameter as the denominator. RADIATION DOSE REDUCTION: This exam was performed according to the departmental dose-optimization program which includes automated exposure control, adjustment of the mA and/or kV according to patient size and/or use of iterative reconstruction technique. CONTRAST:  75mL OMNIPAQUE IOHEXOL 350 MG/ML SOLN COMPARISON:  MRI head same day. FINDINGS: CT HEAD FINDINGS Brain: Left MCA and PCA territory infarcts, better characterized on same day MRI. No mass occupying acute hemorrhage or midline shift. No hydrocephalus. No mass lesion or extra-axial fluid collection. Vascular: See below. Skull: No acute fracture. Sinuses/Orbits: Largely clear sinuses.  No acute orbital findings. Other: No mastoid effusions. Review of the MIP images confirms the above findings CTA NECK FINDINGS Aortic arch: Great vessel origins are patent without significant stenosis. Right carotid system: No evidence of dissection, stenosis (50% or greater), or occlusion. Left carotid system: No evidence of dissection, stenosis (50% or greater), or occlusion. Vertebral arteries: Codominant. No evidence of dissection, stenosis (50% or greater), or occlusion. Skeleton: No acute fracture on limited assessment.  Other neck: No acute abnormality on limited assessment. Upper chest: Visualized lung apices are clear. Review of the MIP images confirms the above findings CTA HEAD FINDINGS Anterior circulation: Bilateral intracranial ICAs are patent. Severe stenosis of proximal left M2 MCA stenosis. Moderate right M1 MCA stenosis. Posterior circulation: Severe stenosis of the proximal intradural vertebral artery, likely due to atherosclerosis. The basilar artery and right posterior cerebral artery are patent. Occlusion of the distal left P2 PCA. Venous sinuses: As permitted by contrast timing, patent. Review of the MIP images confirms the above findings IMPRESSION: 1. Occlusion of the distal left P2 PCA. 2.  Severe left M2 MCA stenosis. 3. Severe proximal left intradural vertebral artery stenosis. 4. Moderate right M1 MCA stenosis. Electronically Signed: By: Feliberto Harts M.D. On: 04/22/2022 13:55   MR Brain Wo Contrast  Result Date: 04/22/2022 CLINICAL DATA:  37 year old male "slurred speech, numbness and tingling, right facial numbness". Symptom onset reportedly began last week. History of hypertension and diabetes. EXAM: MRI HEAD WITHOUT CONTRAST TECHNIQUE: Multiplanar, multiecho pulse sequences of the brain and surrounding structures were obtained without intravenous contrast. COMPARISON:  No prior brain imaging. FINDINGS: Brain: Relatively large, roughly 5 cm area of confluent restricted diffusion in the left occipital pole, affecting the inferior left parietal lobe (left PCA territory). Left thalamus and temporal lobe appear relatively spared, but there is a roughly 1 cm area of diffusion restriction also in the splenium of the corpus callosum. Furthermore, there is similar patchy, scattered gyral and subcortical white matter diffusion restriction in the left middle frontal gyrus (middle division left MCA territory). Some of the left insula and inferior frontal gyrus also affected. No restricted diffusion identified  in the right hemisphere or the posterior fossa. T2 hyperintense cytotoxic edema in the affected areas. Mild petechial hemorrhage in the left occipital pole on SWI (series 11, image 31). No other convincing blood products. No superimposed midline shift, ventriculomegaly, extra-axial collection. Left hemisphere and posterior fossa gray and white matter signal appears to remain normal. Cervicomedullary junction and pituitary are within normal limits. Vascular: Major intracranial vascular flow voids are preserved. Skull and upper cervical spine: Negative visible cervical spine. Visualized bone marrow signal is within normal limits. Sinuses/Orbits: Rightward gaze. Otherwise negative orbits soft tissues. Scattered paranasal sinus mucosal thickening is mild. Other: Mastoids appear clear. Grossly normal visible internal auditory structures. Negative visible scalp and face. IMPRESSION: 1. Positive for acute to early subacute large or medium-sized vessel type infarcts in both the Left MCA and Left PCA territories. Cytotoxic edema with minor petechial hemorrhage in the left occipital lobe. No malignant hemorrhagic transformation or intracranial mass effect. No ischemia in the right hemisphere or posterior fossa, which argues against a recent cardiac embolic event. 2. Study discussed by telephone with Dr. Sanda Linger on 04/22/2022 at 1012 hours. And we are arranging for the patient to present to the [facility] Emergency Department for additional Stroke care and workup. Electronically Signed   By: Odessa Fleming M.D.   On: 04/22/2022 10:23     TODAY-DAY OF DISCHARGE:  Subjective:   Jesse Duncan today has no headache,no chest abdominal pain,no new weakness tingling or numbness, feels much better wants to go home today.   Objective:   Blood pressure (!) 132/92, pulse 76, temperature 99 F (37.2 C), temperature source Oral, resp. rate (!) 26, height 6\' 1"  (1.854 m), weight 122.9 kg, SpO2 97 %. No intake or  output data in the 24 hours ending 04/23/22 1618 Filed Weights   04/22/22 1102  Weight: 122.9 kg    Exam: Awake Alert, Oriented *3, No new F.N deficits, Normal affect [place].AT,PERRAL Supple Neck,No JVD, No cervical lymphadenopathy appriciated.  Symmetrical Chest wall movement, Good air movement bilaterally, CTAB RRR,No Gallops,Rubs or new Murmurs, No Parasternal Heave +ve B.Sounds, Abd Soft, Non tender, No organomegaly appriciated, No rebound -guarding or rigidity. No Cyanosis, Clubbing or edema, No new Rash or bruise   PERTINENT RADIOLOGIC STUDIES: ECHOCARDIOGRAM COMPLETE  Result Date: 04/23/2022    ECHOCARDIOGRAM REPORT   Patient Name:   Jesse Duncan Date of Exam: 04/23/2022 Medical Rec #:  [id number]  Height:       73.0 in Accession #:    [id number]         Weight:       271.0 lb Date of Birth:  Nov 06, 1985          BSA:          2.448 m Patient Age:    36 years           BP:           124/73 mmHg Patient Gender: M                  HR:           63 bpm. Exam Location:  Inpatient Procedure: 2D Echo, Cardiac Doppler and Color Doppler Indications:    Stroke  History:        Patient has prior history of Echocardiogram examinations, most                 recent 08/04/2015. Stroke, Arrythmias:Atrial Fibrillation; Risk                 Factors:Diabetes, Current Smoker and HLD.  Sonographer:    Lucy Antigua Referring Phys: [id number] Jesse Duncan IMPRESSIONS  1. Left ventricular ejection fraction, by estimation, is 40 to 45%. The left ventricle has mildly decreased function. The left ventricle demonstrates global hypokinesis. There is mild concentric left ventricular hypertrophy. Left ventricular diastolic parameters are consistent with Grade I diastolic dysfunction (impaired relaxation).  2. Right ventricular systolic function is normal. The right ventricular size is normal. Tricuspid regurgitation signal is inadequate for assessing PA pressure.  3. The mitral valve is normal in structure. No  evidence of mitral valve regurgitation. No evidence of mitral stenosis.  4. The aortic valve is tricuspid. Aortic valve regurgitation is not visualized. No aortic stenosis is present.  5. The inferior vena cava is normal in size with greater than 50% respiratory variability, suggesting right atrial pressure of 3 mmHg. FINDINGS  Left Ventricle: Left ventricular ejection fraction, by estimation, is 40 to 45%. The left ventricle has mildly decreased function. The left ventricle demonstrates global hypokinesis. The left ventricular internal cavity size was normal in size. There is  mild concentric left ventricular hypertrophy. Left ventricular diastolic parameters are consistent with Grade I diastolic dysfunction (impaired relaxation). Right Ventricle: The right ventricular size is normal. No increase in right ventricular wall thickness. Right ventricular systolic function is normal. Tricuspid regurgitation signal is inadequate for assessing PA pressure. Left Atrium: Left atrial size was normal in size. Right Atrium: Right atrial size was normal in size. Pericardium: There is no evidence of pericardial effusion. Mitral Valve: The mitral valve is normal in structure. Mild mitral annular calcification. No evidence of mitral valve regurgitation. No evidence of mitral valve stenosis. Tricuspid Valve: The tricuspid valve is normal in structure. Tricuspid valve regurgitation is not demonstrated. Aortic Valve: The aortic valve is tricuspid. Aortic valve regurgitation is not visualized. No aortic stenosis is present. Aortic valve mean gradient measures 3.0 mmHg. Aortic valve peak gradient measures 6.7 mmHg. Aortic valve area, by VTI measures 3.86 cm. Pulmonic Valve: The pulmonic valve was normal in structure. Pulmonic valve regurgitation is not visualized. Aorta: The aortic root is normal in size and structure. Venous: The inferior vena cava is normal in size with greater than 50% respiratory variability, suggesting right  atrial pressure of 3 mmHg. IAS/Shunts: No atrial level shunt detected by color flow Doppler.  LEFT VENTRICLE PLAX 2D LVIDd:  5.40 cm   Diastology LVIDs:         3.80 cm   LV e' medial:    5.13 cm/s LV PW:         1.40 cm   LV E/e' medial:  11.7 LV IVS:        1.20 cm   LV e' lateral:   8.24 cm/s LVOT diam:     2.60 cm   LV E/e' lateral: 7.3 LV SV:         83 LV SV Index:   34 LVOT Area:     5.31 cm  RIGHT VENTRICLE RV S prime:     15.20 cm/s TAPSE (M-mode): 2.2 cm LEFT ATRIUM             Index        RIGHT ATRIUM           Index LA Vol (A2C):   25.5 ml 10.42 ml/m  RA Area:     15.30 cm LA Vol (A4C):   30.6 ml 12.50 ml/m  RA Volume:   37.30 ml  15.24 ml/m LA Biplane Vol: 29.9 ml 12.21 ml/m  AORTIC VALVE AV Area (Vmax):    3.44 cm AV Area (Vmean):   3.42 cm AV Area (VTI):     3.86 cm AV Vmax:           129.00 cm/s AV Vmean:          84.400 cm/s AV VTI:            0.216 m AV Peak Grad:      6.7 mmHg AV Mean Grad:      3.0 mmHg LVOT Vmax:         83.60 cm/s LVOT Vmean:        54.400 cm/s LVOT VTI:          0.157 m LVOT/AV VTI ratio: 0.73  AORTA Ao Root diam: 3.40 cm Ao Asc diam:  2.80 cm MITRAL VALVE MV Area (PHT): 2.76 cm    SHUNTS MV Decel Time: 275 msec    Systemic VTI:  0.16 m MV E velocity: 59.80 cm/s  Systemic Diam: 2.60 cm MV A velocity: 58.60 cm/s MV E/A ratio:  1.02 Jesse McleanMD Electronically signed by Wilfred Lacy Signature Date/Time: 04/23/2022/4:10:48 PM    Final    CT ANGIO HEAD NECK W WO CM  Addendum Date: 04/22/2022   ADDENDUM REPORT: 04/22/2022 14:08 ADDENDUM: Findings discussed with Dr. Dalene Seltzer via telephone at 2 p.m. Electronically Signed   By: Feliberto Harts M.D.   On: 04/22/2022 14:08   Result Date: 04/22/2022 CLINICAL DATA:  Neuro deficit, acute, stroke suspected EXAM: CT ANGIOGRAPHY HEAD AND NECK WITH AND WITHOUT CONTRAST TECHNIQUE: Multidetector CT imaging of the head and neck was performed using the standard protocol during bolus administration of intravenous  contrast. Multiplanar CT image reconstructions and MIPs were obtained to evaluate the vascular anatomy. Carotid stenosis measurements (when applicable) are obtained utilizing NASCET criteria, using the distal internal carotid diameter as the denominator. RADIATION DOSE REDUCTION: This exam was performed according to the departmental dose-optimization program which includes automated exposure control, adjustment of the mA and/or kV according to patient size and/or use of iterative reconstruction technique. CONTRAST:  75mL OMNIPAQUE IOHEXOL 350 MG/ML SOLN COMPARISON:  MRI head same day. FINDINGS: CT HEAD FINDINGS Brain: Left MCA and PCA territory infarcts, better characterized on same day MRI. No mass occupying acute hemorrhage or midline shift. No hydrocephalus. No mass lesion  or extra-axial fluid collection. Vascular: See below. Skull: No acute fracture. Sinuses/Orbits: Largely clear sinuses.  No acute orbital findings. Other: No mastoid effusions. Review of the MIP images confirms the above findings CTA NECK FINDINGS Aortic arch: Great vessel origins are patent without significant stenosis. Right carotid system: No evidence of dissection, stenosis (50% or greater), or occlusion. Left carotid system: No evidence of dissection, stenosis (50% or greater), or occlusion. Vertebral arteries: Codominant. No evidence of dissection, stenosis (50% or greater), or occlusion. Skeleton: No acute fracture on limited assessment. Other neck: No acute abnormality on limited assessment. Upper chest: Visualized lung apices are clear. Review of the MIP images confirms the above findings CTA HEAD FINDINGS Anterior circulation: Bilateral intracranial ICAs are patent. Severe stenosis of proximal left M2 MCA stenosis. Moderate right M1 MCA stenosis. Posterior circulation: Severe stenosis of the proximal intradural vertebral artery, likely due to atherosclerosis. The basilar artery and right posterior cerebral artery are patent. Occlusion  of the distal left P2 PCA. Venous sinuses: As permitted by contrast timing, patent. Review of the MIP images confirms the above findings IMPRESSION: 1. Occlusion of the distal left P2 PCA. 2. Severe left M2 MCA stenosis. 3. Severe proximal left intradural vertebral artery stenosis. 4. Moderate right M1 MCA stenosis. Electronically Signed: By: Feliberto Harts M.D. On: 04/22/2022 13:55   MR Brain Wo Contrast  Result Date: 04/22/2022 CLINICAL DATA:  37 year old male "slurred speech, numbness and tingling, right facial numbness". Symptom onset reportedly began last week. History of hypertension and diabetes. EXAM: MRI HEAD WITHOUT CONTRAST TECHNIQUE: Multiplanar, multiecho pulse sequences of the brain and surrounding structures were obtained without intravenous contrast. COMPARISON:  No prior brain imaging. FINDINGS: Brain: Relatively large, roughly 5 cm area of confluent restricted diffusion in the left occipital pole, affecting the inferior left parietal lobe (left PCA territory). Left thalamus and temporal lobe appear relatively spared, but there is a roughly 1 cm area of diffusion restriction also in the splenium of the corpus callosum. Furthermore, there is similar patchy, scattered gyral and subcortical white matter diffusion restriction in the left middle frontal gyrus (middle division left MCA territory). Some of the left insula and inferior frontal gyrus also affected. No restricted diffusion identified in the right hemisphere or the posterior fossa. T2 hyperintense cytotoxic edema in the affected areas. Mild petechial hemorrhage in the left occipital pole on SWI (series 11, image 31). No other convincing blood products. No superimposed midline shift, ventriculomegaly, extra-axial collection. Left hemisphere and posterior fossa gray and white matter signal appears to remain normal. Cervicomedullary junction and pituitary are within normal limits. Vascular: Major intracranial vascular flow voids are  preserved. Skull and upper cervical spine: Negative visible cervical spine. Visualized bone marrow signal is within normal limits. Sinuses/Orbits: Rightward gaze. Otherwise negative orbits soft tissues. Scattered paranasal sinus mucosal thickening is mild. Other: Mastoids appear clear. Grossly normal visible internal auditory structures. Negative visible scalp and face. IMPRESSION: 1. Positive for acute to early subacute large or medium-sized vessel type infarcts in both the Left MCA and Left PCA territories. Cytotoxic edema with minor petechial hemorrhage in the left occipital lobe. No malignant hemorrhagic transformation or intracranial mass effect. No ischemia in the right hemisphere or posterior fossa, which argues against a recent cardiac embolic event. 2. Study discussed by telephone with Dr. Sanda Linger on 04/22/2022 at 1012 hours. And we are arranging for the patient to present to the [facility] Emergency Department for additional Stroke care and workup. Electronically Signed   By:  Odessa Fleming M.D.   On: 04/22/2022 10:23     PERTINENT LAB RESULTS: CBC: Recent Labs    04/22/22 1120  WBC 6.4  HGB 12.5*  HCT 38.4*  PLT 330   CMET CMP     Component Value Date/Time   NA 135 04/22/2022 1120   NA 138 09/28/2021 0953   K 3.6 04/22/2022 1120   CL 99 04/22/2022 1120   CO2 25 04/22/2022 1120   GLUCOSE 229 (H) 04/22/2022 1120   BUN 8 04/22/2022 1120   BUN 10 09/28/2021 0953   CREATININE 0.83 04/22/2022 1120   CREATININE 0.94 08/26/2016 1546   CALCIUM 9.9 04/22/2022 1120   PROT 7.3 04/22/2022 1120   PROT 7.0 09/28/2021 0953   ALBUMIN 3.8 04/22/2022 1120   ALBUMIN 4.4 09/28/2021 0953   AST 22 04/22/2022 1120   ALT 14 04/22/2022 1120   ALKPHOS 71 04/22/2022 1120   BILITOT 1.5 (H) 04/22/2022 1120   BILITOT 0.4 09/28/2021 0953   GFRNONAA >60 04/22/2022 1120   GFRAA 121 12/07/2019 1420    GFR Estimated Creatinine Clearance: 170.6 mL/min (by C-G formula based on SCr of 0.83  mg/dL). No results for input(s): "LIPASE", "AMYLASE" in the last 72 hours. No results for input(s): "CKTOTAL", "CKMB", "CKMBINDEX", "TROPONINI" in the last 72 hours. Invalid input(s): "POCBNP" No results for input(s): "DDIMER" in the last 72 hours. No results for input(s): "HGBA1C" in the last 72 hours. Recent Labs    04/23/22 0345  CHOL 288*  HDL 36*  LDLCALC 189*  TRIG 313*  CHOLHDL 8.0   No results for input(s): "TSH", "T4TOTAL", "T3FREE", "THYROIDAB" in the last 72 hours.  Invalid input(s): "FREET3" No results for input(s): "VITAMINB12", "FOLATE", "FERRITIN", "TIBC", "IRON", "RETICCTPCT" in the last 72 hours. Coags: Recent Labs    04/22/22 1120  INR 1.0   Microbiology: No results found for this or any previous visit (from the past 240 hour(s)).  FURTHER DISCHARGE INSTRUCTIONS:  Get Medicines reviewed and adjusted: Please take all your medications with you for your next visit with your Primary MD  Laboratory/radiological data: Please request your Primary MD to go over all hospital tests and procedure/radiological results at the follow up, please ask your Primary MD to get all Hospital records sent to his/her office.  In some cases, they will be blood work, cultures and biopsy results pending at the time of your discharge. Please request that your primary care M.D. goes through all the records of your hospital data and follows up on these results.  Also Note the following: If you experience worsening of your admission symptoms, develop shortness of breath, life threatening emergency, suicidal or homicidal thoughts you must seek medical attention immediately by calling 911 or calling your MD immediately  if symptoms less severe.  You must read complete instructions/literature along with all the possible adverse reactions/side effects for all the Medicines you take and that have been prescribed to you. Take any new Medicines after you have completely understood and accpet all  the possible adverse reactions/side effects.   Do not drive when taking Pain medications or sleeping medications (Benzodaizepines)  Do not take more than prescribed Pain, Sleep and Anxiety Medications. It is not advisable to combine anxiety,sleep and pain medications without talking with your primary care practitioner  Special Instructions: If you have smoked or chewed Tobacco  in the last 2 yrs please stop smoking, stop any regular Alcohol  and or any Recreational drug use.  Wear Seat belts while driving.  Please note: You were cared for by a hospitalist during your hospital stay. Once you are discharged, your primary care physician will handle any further medical issues. Please note that NO REFILLS for any discharge medications will be authorized once you are discharged, as it is imperative that you return to your primary care physician (or establish a relationship with a primary care physician if you do not have one) for your post hospital discharge needs so that they can reassess your need for medications and monitor your lab values.  Total Time spent coordinating discharge including counseling, education and face to face time equals greater than 30 minutes.  SignedJeoffrey Massed 04/23/2022 4:18 PM

## 2022-04-23 NOTE — TOC Benefit Eligibility Note (Signed)
Patient Product/process development scientist completed.    The patient is currently admitted and upon discharge could be taking Eliquis 5 mg.  The current 30 day co-pay is $75.00.   The patient is insured through [employer]   This test claim was processed through [facility]- copay amounts may vary at other pharmacies due to pharmacy/plan contracts, or as the patient moves through the different stages of their insurance plan.  Roland Earl, CPHT Pharmacy Patient Advocate Specialist [facility] Patient Advocate Team Direct Number: [phone]  Fax: [phone]

## 2022-04-26 ENCOUNTER — Telehealth: Payer: Self-pay

## 2022-04-26 NOTE — Transitions of Care (Post Inpatient/ED Visit) (Signed)
   04/26/2022  Name: Jesse Duncan MRN: [id number] DOB: 07/10/1985  Today's TOC FU Call Status: Today's TOC FU Call Status:: Successful TOC FU Call Competed TOC FU Call Complete Date: 04/26/22  Transition Care Management Follow-up Telephone Call Date of Discharge: 04/23/22 Discharge Facility: [facility]) Type of Discharge: Inpatient Admission Primary Inpatient Discharge Diagnosis:: "stroke" How have you been since you were released from the hospital?: Better (Pr states he is "able to move hand a little bit better & getting around better." He reports he still "has trouble at times getting his thoughts out clearly and stuttering." He is aware that this is to be expected post-stroke and hopeful it will improve.) Any questions or concerns?: No  Items Reviewed: Did you receive and understand the discharge instructions provided?: Yes Medications obtained and verified?: Yes (Medications Reviewed) (pt ran of out insulin yesterday-has called pharmacy today to get refill) Any new allergies since your discharge?: No Dietary orders reviewed?: Yes Type of Diet Ordered:: low salt/heart healthy/carb modified Do you have support at home?: Yes People in Home: spouse Name of Support/Comfort Primary Source: [facility] and Equipment/Supplies: Were Home Health Services Ordered?: NA Any new equipment or medical supplies ordered?: NA  Functional Questionnaire: Do you need assistance with bathing/showering or dressing?: No Do you need assistance with meal preparation?: No Do you need assistance with eating?: No Do you have difficulty maintaining continence: No Do you need assistance with getting out of bed/getting out of a chair/moving?: No Do you have difficulty managing or taking your medications?: No  Follow up appointments reviewed: PCP Follow-up appointment confirmed?: Yes Date of PCP follow-up appointment?: 05/03/22 Follow-up Provider: Dr. [name] Specialist [facility] Follow-up  appointment confirmed?: No Reason Specialist Follow-Up Not Confirmed: Patient has Specialist Provider Number and will Call for Appointment (provided pt with [facility] office number to call and make f/u appt) Do you need transportation to your follow-up appointment?: No Do you understand care options if your condition(s) worsen?: Yes-patient verbalized understanding  SDOH Interventions Today    Flowsheet Row Most Recent Value  SDOH Interventions   Food Insecurity Interventions Intervention Not Indicated  Transportation Interventions Intervention Not Indicated      TOC Interventions Today    Flowsheet Row Most Recent Value  TOC Interventions   TOC Interventions Discussed/Reviewed TOC Interventions Discussed, Post discharge activity limitations per provider, Arranged PCP follow up within 7 days/Care Guide scheduled      Interventions Today    Flowsheet Row Most Recent Value  Chronic Disease   Chronic disease during today's visit Diabetes, Other  [post-stroke mgmt]  General Interventions   General Interventions Discussed/Reviewed General Interventions Discussed, Doctor Visits, Referral to Nurse  [f/u appt with assigned RN CM care coordinator scheduled]  Doctor Visits Discussed/Reviewed Doctor Visits Discussed, PCP, Specialist  PCP/Specialist Visits Compliance with follow-up visit  Education Interventions   Education Provided Provided Education  Provided Verbal Education On Nutrition, When to see the doctor, Medication, Blood Sugar Monitoring  [pt states cbgs have been ranging in the 160s-300s since d/c home, fasting blood sugar this morning was 202]  Nutrition Interventions   Nutrition Discussed/Reviewed Nutrition Discussed, Adding fruits and vegetables, Decreasing sugar intake, Decreasing salt, Increaing proteins  Pharmacy Interventions   Pharmacy Dicussed/Reviewed Pharmacy Topics Discussed, Medications and their functions  Safety Interventions   Safety Discussed/Reviewed Safety  Discussed        Alessandra Grout [facility] Care Management Care Management Community Coordinator Direct Phone: [phone] Toll Free: [phone] Fax: [phone]

## 2022-04-27 ENCOUNTER — Telehealth: Payer: Self-pay | Admitting: Internal Medicine

## 2022-04-27 NOTE — Telephone Encounter (Signed)
Disability paperwork received for patient & placed in provider box up front.

## 2022-04-28 DIAGNOSIS — Z0289 Encounter for other administrative examinations: Secondary | ICD-10-CM

## 2022-04-28 NOTE — Telephone Encounter (Signed)
Forms completed and given to PCP to review and sign.  

## 2022-04-28 NOTE — Telephone Encounter (Signed)
Forms have been signed and faxed back to [facility].   Copy sent to charge. Original filed with CMA

## 2022-05-03 ENCOUNTER — Other Ambulatory Visit: Payer: Self-pay | Admitting: Internal Medicine

## 2022-05-03 ENCOUNTER — Encounter: Payer: Self-pay | Admitting: Internal Medicine

## 2022-05-03 ENCOUNTER — Ambulatory Visit (INDEPENDENT_AMBULATORY_CARE_PROVIDER_SITE_OTHER): Payer: BC Managed Care – PPO | Admitting: Internal Medicine

## 2022-05-03 VITALS — BP 142/82 | HR 84 | Temp 97.9°F | Ht 73.0 in | Wt 267.0 lb

## 2022-05-03 DIAGNOSIS — Z23 Encounter for immunization: Secondary | ICD-10-CM | POA: Diagnosis not present

## 2022-05-03 DIAGNOSIS — I5042 Chronic combined systolic (congestive) and diastolic (congestive) heart failure: Secondary | ICD-10-CM | POA: Diagnosis not present

## 2022-05-03 DIAGNOSIS — E119 Type 2 diabetes mellitus without complications: Secondary | ICD-10-CM

## 2022-05-03 DIAGNOSIS — I480 Paroxysmal atrial fibrillation: Secondary | ICD-10-CM | POA: Diagnosis not present

## 2022-05-03 DIAGNOSIS — E66812 Obesity, class 2: Secondary | ICD-10-CM | POA: Insufficient documentation

## 2022-05-03 DIAGNOSIS — Z6835 Body mass index (BMI) 35.0-35.9, adult: Secondary | ICD-10-CM

## 2022-05-03 DIAGNOSIS — I63313 Cerebral infarction due to thrombosis of bilateral middle cerebral arteries: Secondary | ICD-10-CM

## 2022-05-03 DIAGNOSIS — Z794 Long term (current) use of insulin: Secondary | ICD-10-CM | POA: Diagnosis not present

## 2022-05-03 DIAGNOSIS — D539 Nutritional anemia, unspecified: Secondary | ICD-10-CM

## 2022-05-03 DIAGNOSIS — I10 Essential (primary) hypertension: Secondary | ICD-10-CM | POA: Diagnosis not present

## 2022-05-03 LAB — CBC WITH DIFFERENTIAL/PLATELET
Basophils Absolute: 0 10*3/uL (ref 0.0–0.1)
Basophils Relative: 0.6 % (ref 0.0–3.0)
Eosinophils Absolute: 0.2 10*3/uL (ref 0.0–0.7)
Eosinophils Relative: 2.6 % (ref 0.0–5.0)
HCT: 35.7 % — ABNORMAL LOW (ref 39.0–52.0)
Hemoglobin: 12 g/dL — ABNORMAL LOW (ref 13.0–17.0)
Lymphocytes Relative: 40.4 % (ref 12.0–46.0)
Lymphs Abs: 2.5 10*3/uL (ref 0.7–4.0)
MCHC: 33.5 g/dL (ref 30.0–36.0)
MCV: 81.8 fl (ref 78.0–100.0)
Monocytes Absolute: 0.5 10*3/uL (ref 0.1–1.0)
Monocytes Relative: 7.5 % (ref 3.0–12.0)
Neutro Abs: 3 10*3/uL (ref 1.4–7.7)
Neutrophils Relative %: 48.9 % (ref 43.0–77.0)
Platelets: 337 10*3/uL (ref 150.0–400.0)
RBC: 4.37 Mil/uL (ref 4.22–5.81)
RDW: 13.8 % (ref 11.5–15.5)
WBC: 6.2 10*3/uL (ref 4.0–10.5)

## 2022-05-03 LAB — MICROALBUMIN / CREATININE URINE RATIO
Creatinine,U: 96.3 mg/dL
Microalb Creat Ratio: 2 mg/g (ref 0.0–30.0)
Microalb, Ur: 1.9 mg/dL (ref 0.0–1.9)

## 2022-05-03 MED ORDER — DEXCOM G7 SENSOR MISC
1.0000 | Freq: Every day | 1 refills | Status: DC
Start: 2022-05-03 — End: 2022-10-28

## 2022-05-03 MED ORDER — DEXCOM G7 RECEIVER DEVI
1.0000 | Freq: Every day | 1 refills | Status: DC
Start: 2022-05-03 — End: 2023-05-02

## 2022-05-03 MED ORDER — HYDRALAZINE HCL 25 MG PO TABS
25.0000 mg | ORAL_TABLET | Freq: Three times a day (TID) | ORAL | 0 refills | Status: DC
Start: 2022-05-03 — End: 2022-09-22

## 2022-05-03 MED ORDER — FREESTYLE LIBRE 3 SENSOR MISC
1.0000 | Freq: Every day | 5 refills | Status: DC
Start: 2022-05-03 — End: 2022-05-03

## 2022-05-03 MED ORDER — CARVEDILOL 12.5 MG PO TABS: 12.5000 mg | ORAL_TABLET | Freq: Two times a day (BID) | ORAL | 0 refills | Status: DC

## 2022-05-03 MED ORDER — FREESTYLE LIBRE 3 READER DEVI
1.0000 | Freq: Every day | 3 refills | Status: DC
Start: 2022-05-03 — End: 2022-05-03

## 2022-05-03 NOTE — Progress Notes (Unsigned)
Subjective:  Patient ID: Jesse Duncan, male    DOB: Dec 12, 1985  Age: 37 y.o. MRN: [id number]  CC: Hypertension, Diabetes, and Hyperlipidemia   HPI Jesse Duncan presents for f/up after recent admission for CVAs.  His speech is improving and the paresthesias in his right upper extremity are improving.  Admit Date: 04/22/2022 Discharge date: 04/23/2022   Recommendations for Outpatient Follow-up:  Follow up with PCP in 1-2 weeks Please obtain CMP/CBC in one week Please ensure follow-up with neurology and cardiology.  Outpatient Medications Prior to Visit  Medication Sig Dispense Refill   aspirin EC 325 MG tablet Take 1 tablet (325 mg total) by mouth daily. Swallow whole. Take with Plavix for 90 days, after 90 stop plavix and continue on Aspirin 90 tablet 2   clopidogrel (PLAVIX) 75 MG tablet Take 1 tablet (75 mg total) by mouth daily. Take along with aspirin for 90 days and then stop Plavix, but continue on ASA 90 tablet 0   diltiazem (CARDIZEM CD) 300 MG 24 hr capsule TAKE 1 CAPSULE BY MOUTH EVERY DAY 90 capsule 1   glucose blood test strip Use as instructed 100 each 12   insulin aspart (FIASP) 100 UNIT/ML FlexTouch Pen Inject 10 Units into the skin 3 (three) times daily with meals. 15 mL 1   insulin glargine, 1 Unit Dial, (TOUJEO SOLOSTAR) 300 UNIT/ML Solostar Pen Inject 40 Units into the skin daily. 45 mL 3   Insulin Pen Needle (BD PEN NEEDLE NANO U/F) 32G X 4 MM MISC 1 Act by Subdermal route in the morning, at noon, in the evening, and at bedtime. Use to inject insulin once daily 400 each 1   metFORMIN (GLUCOPHAGE) 1000 MG tablet Take 1 tablet (1,000 mg total) by mouth 2 (two) times daily with a meal. 180 tablet 0   nicotine polacrilex (NICORETTE MINI) 4 MG lozenge Use 4mg  three times a day to stop smoking 100 tablet 4   pantoprazole (PROTONIX) 40 MG tablet Take 1 tablet (40 mg total) by mouth daily. 30 tablet 1   rosuvastatin (CRESTOR) 40 MG tablet Take 1 tablet (40 mg  total) by mouth daily. 90 tablet 1   Semaglutide (RYBELSUS) 7 MG TABS Take 7 mg by mouth daily. 60 tablet 2   valsartan-hydrochlorothiazide (DIOVAN-HCT) 320-25 MG tablet Take 1 tablet by mouth daily. 90 tablet 3   carvedilol (COREG) 12.5 MG tablet Take 1 tablet (12.5 mg total) by mouth 2 (two) times daily with a meal. 60 tablet 3   OneTouch UltraSoft 2 Lancets MISC 1 Units by Does not apply route 3 (three) times daily. Before meals 100 each 4   No facility-administered medications prior to visit.    ROS Review of Systems  Constitutional: Negative.  Negative for diaphoresis and fatigue.  HENT: Negative.    Eyes: Negative.  Negative for visual disturbance.  Respiratory:  Negative for cough, chest tightness and wheezing.   Cardiovascular:  Negative for chest pain, palpitations and leg swelling.  Gastrointestinal:  Negative for abdominal pain, diarrhea, nausea and vomiting.  Endocrine: Negative.   Genitourinary: Negative.   Musculoskeletal: Negative.   Skin: Negative.   Neurological:  Positive for speech difficulty and numbness. Negative for dizziness, weakness and headaches.  Hematological:  Negative for adenopathy. Does not bruise/bleed easily.  Psychiatric/Behavioral: Negative.      Objective:  BP (!) 142/82 (BP Location: Left Arm, Patient Position: Sitting, Cuff Size: Large)   Pulse 84   Temp 97.9 F (36.6 C) (Oral)   Ht  6\' 1"  (1.854 m)   Wt 267 lb (121.1 kg)   SpO2 98%   BMI 35.23 kg/m   BP Readings from Last 3 Encounters:  05/03/22 (!) 142/82  04/23/22 (!) 132/92  04/20/22 (!) 152/94    Wt Readings from Last 3 Encounters:  05/03/22 267 lb (121.1 kg)  04/22/22 271 lb (122.9 kg)  04/20/22 271 lb (122.9 kg)    Physical Exam Vitals reviewed.  Constitutional:      Appearance: He is not ill-appearing.  HENT:     Nose: Nose normal.     Mouth/Throat:     Mouth: Mucous membranes are moist.  Eyes:     General: No scleral icterus.    Conjunctiva/sclera: Conjunctivae  normal.  Cardiovascular:     Rate and Rhythm: Normal rate and regular rhythm.     Heart sounds: No murmur heard. Pulmonary:     Effort: Pulmonary effort is normal.     Breath sounds: No stridor. No wheezing, rhonchi or rales.  Abdominal:     General: Abdomen is flat.     Palpations: There is no mass.     Tenderness: There is no abdominal tenderness. There is no guarding.     Hernia: No hernia is present.  Musculoskeletal:        General: Normal range of motion.     Cervical back: Neck supple.     Right lower leg: No edema.     Left lower leg: No edema.  Lymphadenopathy:     Cervical: No cervical adenopathy.  Skin:    General: Skin is warm and dry.  Neurological:     General: No focal deficit present.     Mental Status: He is alert. Mental status is at baseline.  Psychiatric:        Mood and Affect: Mood normal.        Behavior: Behavior normal.     Lab Results  Component Value Date   WBC 6.2 05/03/2022   HGB 12.0 (L) 05/03/2022   HCT 35.7 (L) 05/03/2022   PLT 337.0 05/03/2022   GLUCOSE 229 (H) 04/22/2022   CHOL 288 (H) 04/23/2022   TRIG 313 (H) 04/23/2022   HDL 36 (L) 04/23/2022   LDLDIRECT 223.0 04/08/2022   LDLCALC 189 (H) 04/23/2022   ALT 14 04/22/2022   AST 22 04/22/2022   NA 135 04/22/2022   K 3.6 04/22/2022   CL 99 04/22/2022   CREATININE 0.83 04/22/2022   BUN 8 04/22/2022   CO2 25 04/22/2022   TSH 0.90 04/08/2022   PSA 1.57 10/18/2012   INR 1.0 04/22/2022   HGBA1C 14.3 (H) 04/08/2022   MICROALBUR 1.9 05/03/2022    ECHOCARDIOGRAM COMPLETE  Result Date: 04/23/2022    ECHOCARDIOGRAM REPORT   Patient Name:   Jesse Duncan Date of Exam: 04/23/2022 Medical Rec #:  [id number]          Height:       73.0 in Accession #:    [id number]         Weight:       271.0 lb Date of Birth:  1985/01/17          BSA:          2.448 m Patient Age:    36 years           BP:           124/73 mmHg Patient Gender: M  HR:           63 bpm. Exam Location:   Inpatient Procedure: 2D Echo, Cardiac Doppler and Color Doppler Indications:    Stroke  History:        Patient has prior history of Echocardiogram examinations, most                 recent 08/04/2015. Stroke, Arrythmias:Atrial Fibrillation; Risk                 Factors:Diabetes, Current Smoker and HLD.  Sonographer:    Lucy Antigua Referring Phys: [id number] Werner Lean GHIMIRE IMPRESSIONS  1. Left ventricular ejection fraction, by estimation, is 40 to 45%. The left ventricle has mildly decreased function. The left ventricle demonstrates global hypokinesis. There is mild concentric left ventricular hypertrophy. Left ventricular diastolic parameters are consistent with Grade I diastolic dysfunction (impaired relaxation).  2. Right ventricular systolic function is normal. The right ventricular size is normal. Tricuspid regurgitation signal is inadequate for assessing PA pressure.  3. The mitral valve is normal in structure. No evidence of mitral valve regurgitation. No evidence of mitral stenosis.  4. The aortic valve is tricuspid. Aortic valve regurgitation is not visualized. No aortic stenosis is present.  5. The inferior vena cava is normal in size with greater than 50% respiratory variability, suggesting right atrial pressure of 3 mmHg. FINDINGS  Left Ventricle: Left ventricular ejection fraction, by estimation, is 40 to 45%. The left ventricle has mildly decreased function. The left ventricle demonstrates global hypokinesis. The left ventricular internal cavity size was normal in size. There is  mild concentric left ventricular hypertrophy. Left ventricular diastolic parameters are consistent with Grade I diastolic dysfunction (impaired relaxation). Right Ventricle: The right ventricular size is normal. No increase in right ventricular wall thickness. Right ventricular systolic function is normal. Tricuspid regurgitation signal is inadequate for assessing PA pressure. Left Atrium: Left atrial size was normal in size.  Right Atrium: Right atrial size was normal in size. Pericardium: There is no evidence of pericardial effusion. Mitral Valve: The mitral valve is normal in structure. Mild mitral annular calcification. No evidence of mitral valve regurgitation. No evidence of mitral valve stenosis. Tricuspid Valve: The tricuspid valve is normal in structure. Tricuspid valve regurgitation is not demonstrated. Aortic Valve: The aortic valve is tricuspid. Aortic valve regurgitation is not visualized. No aortic stenosis is present. Aortic valve mean gradient measures 3.0 mmHg. Aortic valve peak gradient measures 6.7 mmHg. Aortic valve area, by VTI measures 3.86 cm. Pulmonic Valve: The pulmonic valve was normal in structure. Pulmonic valve regurgitation is not visualized. Aorta: The aortic root is normal in size and structure. Venous: The inferior vena cava is normal in size with greater than 50% respiratory variability, suggesting right atrial pressure of 3 mmHg. IAS/Shunts: No atrial level shunt detected by color flow Doppler.  LEFT VENTRICLE PLAX 2D LVIDd:         5.40 cm   Diastology LVIDs:         3.80 cm   LV e' medial:    5.13 cm/s LV PW:         1.40 cm   LV E/e' medial:  11.7 LV IVS:        1.20 cm   LV e' lateral:   8.24 cm/s LVOT diam:     2.60 cm   LV E/e' lateral: 7.3 LV SV:         83 LV SV Index:   34 LVOT Area:  5.31 cm  RIGHT VENTRICLE RV S prime:     15.20 cm/s TAPSE (M-mode): 2.2 cm LEFT ATRIUM             Index        RIGHT ATRIUM           Index LA Vol (A2C):   25.5 ml 10.42 ml/m  RA Area:     15.30 cm LA Vol (A4C):   30.6 ml 12.50 ml/m  RA Volume:   37.30 ml  15.24 ml/m LA Biplane Vol: 29.9 ml 12.21 ml/m  AORTIC VALVE AV Area (Vmax):    3.44 cm AV Area (Vmean):   3.42 cm AV Area (VTI):     3.86 cm AV Vmax:           129.00 cm/s AV Vmean:          84.400 cm/s AV VTI:            0.216 m AV Peak Grad:      6.7 mmHg AV Mean Grad:      3.0 mmHg LVOT Vmax:         83.60 cm/s LVOT Vmean:        54.400 cm/s LVOT  VTI:          0.157 m LVOT/AV VTI ratio: 0.73  AORTA Ao Root diam: 3.40 cm Ao Asc diam:  2.80 cm MITRAL VALVE MV Area (PHT): 2.76 cm    SHUNTS MV Decel Time: 275 msec    Systemic VTI:  0.16 m MV E velocity: 59.80 cm/s  Systemic Diam: 2.60 cm MV A velocity: 58.60 cm/s MV E/A ratio:  1.02 Dalton McleanMD Electronically signed by Wilfred Lacy Signature Date/Time: 04/23/2022/4:10:48 PM    Final    CT ANGIO HEAD NECK W WO CM  Addendum Date: 04/22/2022   ADDENDUM REPORT: 04/22/2022 14:08 ADDENDUM: Findings discussed with Dr. Dalene Seltzer via telephone at 2 p.m. Electronically Signed   By: Feliberto Harts M.D.   On: 04/22/2022 14:08   Result Date: 04/22/2022 CLINICAL DATA:  Neuro deficit, acute, stroke suspected EXAM: CT ANGIOGRAPHY HEAD AND NECK WITH AND WITHOUT CONTRAST TECHNIQUE: Multidetector CT imaging of the head and neck was performed using the standard protocol during bolus administration of intravenous contrast. Multiplanar CT image reconstructions and MIPs were obtained to evaluate the vascular anatomy. Carotid stenosis measurements (when applicable) are obtained utilizing NASCET criteria, using the distal internal carotid diameter as the denominator. RADIATION DOSE REDUCTION: This exam was performed according to the departmental dose-optimization program which includes automated exposure control, adjustment of the mA and/or kV according to patient size and/or use of iterative reconstruction technique. CONTRAST:  75mL OMNIPAQUE IOHEXOL 350 MG/ML SOLN COMPARISON:  MRI head same day. FINDINGS: CT HEAD FINDINGS Brain: Left MCA and PCA territory infarcts, better characterized on same day MRI. No mass occupying acute hemorrhage or midline shift. No hydrocephalus. No mass lesion or extra-axial fluid collection. Vascular: See below. Skull: No acute fracture. Sinuses/Orbits: Largely clear sinuses.  No acute orbital findings. Other: No mastoid effusions. Review of the MIP images confirms the above findings CTA  NECK FINDINGS Aortic arch: Great vessel origins are patent without significant stenosis. Right carotid system: No evidence of dissection, stenosis (50% or greater), or occlusion. Left carotid system: No evidence of dissection, stenosis (50% or greater), or occlusion. Vertebral arteries: Codominant. No evidence of dissection, stenosis (50% or greater), or occlusion. Skeleton: No acute fracture on limited assessment. Other neck: No acute abnormality on limited assessment. Upper  chest: Visualized lung apices are clear. Review of the MIP images confirms the above findings CTA HEAD FINDINGS Anterior circulation: Bilateral intracranial ICAs are patent. Severe stenosis of proximal left M2 MCA stenosis. Moderate right M1 MCA stenosis. Posterior circulation: Severe stenosis of the proximal intradural vertebral artery, likely due to atherosclerosis. The basilar artery and right posterior cerebral artery are patent. Occlusion of the distal left P2 PCA. Venous sinuses: As permitted by contrast timing, patent. Review of the MIP images confirms the above findings IMPRESSION: 1. Occlusion of the distal left P2 PCA. 2. Severe left M2 MCA stenosis. 3. Severe proximal left intradural vertebral artery stenosis. 4. Moderate right M1 MCA stenosis. Electronically Signed: By: Feliberto Harts M.D. On: 04/22/2022 13:55   MR Brain Wo Contrast  Result Date: 04/22/2022 CLINICAL DATA:  37 year old male "slurred speech, numbness and tingling, right facial numbness". Symptom onset reportedly began last week. History of hypertension and diabetes. EXAM: MRI HEAD WITHOUT CONTRAST TECHNIQUE: Multiplanar, multiecho pulse sequences of the brain and surrounding structures were obtained without intravenous contrast. COMPARISON:  No prior brain imaging. FINDINGS: Brain: Relatively large, roughly 5 cm area of confluent restricted diffusion in the left occipital pole, affecting the inferior left parietal lobe (left PCA territory). Left thalamus and  temporal lobe appear relatively spared, but there is a roughly 1 cm area of diffusion restriction also in the splenium of the corpus callosum. Furthermore, there is similar patchy, scattered gyral and subcortical white matter diffusion restriction in the left middle frontal gyrus (middle division left MCA territory). Some of the left insula and inferior frontal gyrus also affected. No restricted diffusion identified in the right hemisphere or the posterior fossa. T2 hyperintense cytotoxic edema in the affected areas. Mild petechial hemorrhage in the left occipital pole on SWI (series 11, image 31). No other convincing blood products. No superimposed midline shift, ventriculomegaly, extra-axial collection. Left hemisphere and posterior fossa gray and white matter signal appears to remain normal. Cervicomedullary junction and pituitary are within normal limits. Vascular: Major intracranial vascular flow voids are preserved. Skull and upper cervical spine: Negative visible cervical spine. Visualized bone marrow signal is within normal limits. Sinuses/Orbits: Rightward gaze. Otherwise negative orbits soft tissues. Scattered paranasal sinus mucosal thickening is mild. Other: Mastoids appear clear. Grossly normal visible internal auditory structures. Negative visible scalp and face. IMPRESSION: 1. Positive for acute to early subacute large or medium-sized vessel type infarcts in both the Left MCA and Left PCA territories. Cytotoxic edema with minor petechial hemorrhage in the left occipital lobe. No malignant hemorrhagic transformation or intracranial mass effect. No ischemia in the right hemisphere or posterior fossa, which argues against a recent cardiac embolic event. 2. Study discussed by telephone with Dr. Sanda Linger on 04/22/2022 at 1012 hours. And we are arranging for the patient to present to the [facility] Emergency Department for additional Stroke care and workup. Electronically Signed   By: Odessa Fleming  M.D.   On: 04/22/2022 10:23    Assessment & Plan:   Primary hypertension- His blood pressure has improved. -     Carvedilol; Take 1 tablet (12.5 mg total) by mouth 2 (two) times daily with a meal.  Dispense: 180 tablet; Refill: 0 -     hydrALAZINE HCl; Take 1 tablet (25 mg total) by mouth 3 (three) times daily.  Dispense: 270 tablet; Refill: 0 -     CBC with Differential/Platelet; Future  Chronic combined systolic and diastolic heart failure (HCC)- He has a normal volume status. -  Carvedilol; Take 1 tablet (12.5 mg total) by mouth 2 (two) times daily with a meal.  Dispense: 180 tablet; Refill: 0 -     hydrALAZINE HCl; Take 1 tablet (25 mg total) by mouth 3 (three) times daily.  Dispense: 270 tablet; Refill: 0 -     Ambulatory referral to Cardiology  Paroxysmal atrial fibrillation [facility])- He has good rate and rhythm control. -     Carvedilol; Take 1 tablet (12.5 mg total) by mouth 2 (two) times daily with a meal.  Dispense: 180 tablet; Refill: 0 -     Ambulatory referral to Cardiology  Need for vaccination -     Pneumococcal conjugate vaccine 20-valent  Insulin-requiring or dependent type II diabetes mellitus (HCC)- His blood sugar is improving. -     Microalbumin / creatinine urine ratio; Future -     HM Diabetes Foot Exam -     Dexcom G7 Sensor; 1 Act by Does not apply route daily.  Dispense: 9 each; Refill: 1 -     Dexcom G7 Receiver; 1 Act by Does not apply route daily.  Dispense: 9 each; Refill: 1  Class 2 severe obesity due to excess calories with serious comorbidity and body mass index (BMI) of 35.0 to 35.9 in adult [facility]) -     Ambulatory referral to General Surgery  Cerebrovascular accident (CVA) due to bilateral thrombosis of middle cerebral arteries (HCC)- Risk factor modifications addressed. -     Ambulatory referral to Occupational Therapy -     Ambulatory referral to Speech Therapy  Deficiency anemia- Will evaluate for vitamin deficiencies. -     Reticulocytes;  Future -     Vitamin B1; Future -     Zinc; Future -     Folate; Future -     Vitamin B12; Future -     IBC + Ferritin; Future  Other orders -     Tdap vaccine greater than or equal to 7yo IM     Follow-up: Return in about 3 months (around 08/02/2022).  Sanda Linger, MD

## 2022-05-03 NOTE — Patient Instructions (Signed)

## 2022-05-05 ENCOUNTER — Ambulatory Visit: Payer: Self-pay

## 2022-05-05 NOTE — Patient Instructions (Signed)
Visit Information  Thank you for taking time to visit with me today. Please don't hesitate to contact me if I can be of assistance to you.   Following are the goals we discussed today:  Take  medications as prescribed Attend provider appointments as scheduled/recommended] Contact RN Care Coordinator if care coordination needs at [phone]  Our next appointment is by telephone on 05/18/22 at 10:45 am  Please call the care guide team at [phone] if you need to cancel or reschedule your appointment.   If you are experiencing a Mental Health or Behavioral Health Crisis or need someone to talk to, please call the Suicide and Crisis Lifeline: 18  [name], RN, MSN, BSN, CCM [facility] Care Coordinator [phone]

## 2022-05-05 NOTE — Patient Outreach (Signed)
  Care Coordination   Initial Visit Note   05/05/2022 Name: Jesse Duncan MRN: [id number] DOB: 11-Feb-1985  Jesse Duncan is a 37 y.o. year old male who sees [name], MD for primary care. I spoke with  Jesse Duncan by phone today.  What matters to the patients health and wellness today?  [name] admitted 4/18-4/19 with stroke. H/o DM, CHF, a-fib. [name] states he is feeling better. He reports he has not picked up his Dexcom meter, but states he is going to pick it up today. He has bee checking blood sugar and state it has been ranging 200-300. He denies any episodes of hypoglycemia. He is without questions or concerns at this time.  Goals Addressed             This Visit's Progress    Care Coordination activities: continue to improved post hospitalization       Interventions Today    Flowsheet Row Most Recent Value  Chronic Disease   Chronic disease during today's visit Congestive Heart Failure (CHF), Diabetes, Other  [recent hospitalization stroke]  General Interventions   General Interventions Discussed/Reviewed General Interventions Discussed, Doctor Visits  [facility] encouraged patient to contact care coordinator if any care coordination needs prior to next outreach. Contact number provided.]  Doctor Visits Discussed/Reviewed Doctor Visits Discussed, Doctor Visits Reviewed  [reviewed patient instructions from latest PCP office visit]  PCP/Specialist Visits Compliance with follow-up visit  [reviewed upcoming appointments]  Exercise Interventions   Exercise Discussed/Reviewed Exercise Discussed  [reviewed schedule for outpatient rehab schedule OT/SP]  Education Interventions   Education Provided Provided Education  Provided Verbal Education On Blood Sugar Monitoring, Medication, Nutrition  [discussed low salt diet and encouraged healthy eating, lean meats, fresh/frozen fruits and vegetables and to avoid processed foods. encouraged to log blood sugars at least  until he gets used to using Dexcom meter and take all to next PCP visit]  Nutrition Interventions   Nutrition Discussed/Reviewed Nutrition Discussed  Pharmacy Interventions   Pharmacy Dicussed/Reviewed Pharmacy Topics Discussed  Safety Interventions   Safety Discussed/Reviewed Safety Discussed            SDOH assessments and interventions completed:  No recently completed. Patient denies any changes.   Care Coordination Interventions:  Yes, provided   Follow up plan: Follow up call scheduled for 05/18/22    Encounter Outcome:  Pt. Visit Completed   [name], RN, MSN, BSN, CCM [facility] Care Coordinator [phone]

## 2022-05-11 ENCOUNTER — Other Ambulatory Visit: Payer: Self-pay | Admitting: Internal Medicine

## 2022-05-11 ENCOUNTER — Ambulatory Visit: Payer: BC Managed Care – PPO | Attending: Internal Medicine | Admitting: Internal Medicine

## 2022-05-11 VITALS — BP 135/87 | Ht 73.0 in | Wt 274.6 lb

## 2022-05-11 DIAGNOSIS — I639 Cerebral infarction, unspecified: Secondary | ICD-10-CM

## 2022-05-11 DIAGNOSIS — E785 Hyperlipidemia, unspecified: Secondary | ICD-10-CM

## 2022-05-11 DIAGNOSIS — E1169 Type 2 diabetes mellitus with other specified complication: Secondary | ICD-10-CM | POA: Diagnosis not present

## 2022-05-11 DIAGNOSIS — Z7985 Long-term (current) use of injectable non-insulin antidiabetic drugs: Secondary | ICD-10-CM | POA: Diagnosis not present

## 2022-05-11 DIAGNOSIS — I4891 Unspecified atrial fibrillation: Secondary | ICD-10-CM

## 2022-05-11 NOTE — Progress Notes (Signed)
Cardiology Office Note:    Date:  05/11/2022   ID:  Jesse Duncan, DOB [birth year], MRN [id number]  PCP:  [name], MD   [facility] Providers Cardiologist:  Lesleigh Noe, MD (Inactive)     Referring MD: [name], MD   No chief complaint on file. CVA  History of Present Illness:    Jesse Duncan is a 37 y.o. male with a hx of obstructive sleep apnea, obesity, former smoker, paroxysmal atrial fibrillation, poorly controlled DM2, HTN . Patient of Dr. [name]. Last saw in 2021. EKGs this year are in sinus rhythm. He was hospitalized 04/22/2022 2/2 acute CVA Acute infarct left MCA and left PCA territories with cytotoxic edema. CTA head and neck showed ccclusion left P2 PCA, left M2 MCA. Severe proximal left intradural vertebral artery stenosis. Moderate right M1 MCA stenosis. Planned for DAPT for 3 months. EF 40-45%. No afib. Has not had recurrence since DCCV in 2017  Today, he feels well. He is taking his medications each day. Blood glucose mainly less than 200. Taking his insulin, he was unsure of the dosing.  No LE edema. No orthopnea or PND.  Past Medical History:  Diagnosis Date   Allergic rhinitis due to pollen 07/06/2010   Atrial fibrillation (HCC) 08/15/2015   Diabetes mellitus without complication (HCC)    Dyslipidemia    Hyperlipidemia 05/25/2010   Hypertension    Impaired fasting blood sugar 08/15/2015   Obesity    OSA (obstructive sleep apnea) 10/20/2015   Prediabetes    Sleep apnea    Smoker    Tobacco use disorder 11/14/2013    No past surgical history on file.  Current Medications: No outpatient medications have been marked as taking for the 05/11/22 encounter (Office Visit) with Maisie Fus, MD.     Allergies:   Patient has no known allergies.   Social History   Socioeconomic History   Marital status: Married    Spouse name: Not on file   Number of children: Not on file   Years of education: Not on file   Highest education  level: Not on file  Occupational History   Not on file  Tobacco Use   Smoking status: Every Day    Types: Cigars    Passive exposure: Past   Smokeless tobacco: Never  Vaping Use   Vaping Use: Former  Substance and Sexual Activity   Alcohol use: Yes    Comment: occ   Drug use: Never   Sexual activity: Yes    Partners: Female  Other Topics Concern   Not on file  Social History Narrative   ** Merged History Encounter **       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      Social Determinants of Health   Financial Resource Strain: Not on file  Food Insecurity: No Food Insecurity (04/26/2022)   Hunger Vital Sign    Worried About Running Out of Food in the Last Year: Never true    Ran Out of Food in the Last Year: Never true  Transportation Needs: No Transportation Needs (04/26/2022)   PRAPARE - Administrator, Civil Service (Medical): No    Lack of Transportation (Non-Medical): No  Physical Activity: Not on file  Stress: Not on file  Social Connections: Not on file     Family History: The patient's family history includes COPD in his father; Cancer in his maternal grandfather; Healthy in his  father; Hypertension in his mother; Stroke in his maternal grandmother. There is no history of Heart disease.  ROS:   Please see the history of present illness.     All other systems reviewed and are negative.  EKGs/Labs/Other Studies Reviewed:    The following studies were reviewed today:   EKG:  EKG is  ordered today.  The ekg ordered today demonstrates   TTE 08/04/2015- EF 40%, nl RV, no sig valve dx. Noted increased trabeculations  TTE 04/23/2022- EF 40-45%, global hypokinesis, nl RV, no valve dx  EKG 05/11/2022: NSR, sinus arrhythmia  Recent Labs: 04/08/2022: Pro B Natriuretic peptide (BNP) 30.0; TSH 0.90 04/22/2022: ALT 14; BUN 8; Creatinine, Ser 0.83; Potassium 3.6; Sodium 135 05/03/2022: Hemoglobin 12.0; Platelets 337.0  Recent Lipid Panel     Component Value Date/Time   CHOL 288 (H) 04/23/2022 0345   CHOL 298 (H) 09/28/2021 0953   TRIG 313 (H) 04/23/2022 0345   HDL 36 (L) 04/23/2022 0345   HDL 44 09/28/2021 0953   CHOLHDL 8.0 04/23/2022 0345   VLDL 63 (H) 04/23/2022 0345   LDLCALC 189 (H) 04/23/2022 0345   LDLCALC 208 (H) 09/28/2021 0953   LDLDIRECT 223.0 04/08/2022 1533     Risk Assessment/Calculations:    CHA2DS2-VASc Score = 5   This indicates a 7.2% annual risk of stroke. The patient's score is based upon: CHF History: 1 HTN History: 1 Diabetes History: 1 Stroke History: 2 Vascular Disease History: 0 Age Score: 0 Gender Score: 0               Physical Exam:    VS:  BP 135/87   Ht 6\' 1"  (1.854 m)   Wt 274 lb 9.6 oz (124.6 kg)   SpO2 97%   BMI 36.23 kg/m     Wt Readings from Last 3 Encounters:  05/11/22 274 lb 9.6 oz (124.6 kg)  05/03/22 267 lb (121.1 kg)  04/22/22 271 lb (122.9 kg)     GEN:  Well nourished, well developed in no acute distress HEENT: Normal NECK: No JVD;  CARDIAC: RRR, no murmurs, rubs, gallops RESPIRATORY:  Clear to auscultation without rales, wheezing or rhonchi  ABDOMEN: Soft, non-tender, non-distended MUSCULOSKELETAL:  No edema; No deformity  SKIN: Warm and dry NEUROLOGIC:  Alert and oriented x 3 PSYCHIATRIC:  Normal affect   ASSESSMENT:    Paroxysmal Afib: s/p DCCV 2017. CHADS2VASC = 5 (1-HTN, 1-CHF, 2- CVA, 1- DM) - continue coreg 12.5 mg BID - will review his cardiac monitor for recurrence of afib. Can make final decision about AC at that time.   Combined Chronic Systolic and diastolic HF: EF 44-03%.No prior ischemic eval - euvolemic - continue coreg 12.5 mg BID - continue hydralazine 25 mg TID - on SGLT2  MCA stroke: on DAPT planned for 3 months.  Continue crestor 40 mg daily  OSA: does not wear his CPAP. Notes he lost weight and stopped wearing it, I encouraged him to continue  DMII: Very poorly controlled A1c 14. goal A1c < 7.  A1c 14. Continue  jardiance 10 mg daily. He could use more support with medications. If having trouble with A1c, can refer him to our pharmacy. I discussed that he is at high risk for recurrent stroke, CAD, CKD etc.  HTN: 135/87 mmHg. Continue diltiazem 300 mg daily, hydralazine 25 mg TID. BP goal < 130/80 mmHg, can discuss on return visit about ambulatory monitoring  HLD: Direct LDL 223. Was not compliant with statins. Continue crestor 40  mg daily  PLAN:    In order of problems listed above:   Fasting direct LDL in 2 months (may require PCSK9i) 30 day cardiac monitor Follow up in 6 months       Medication Adjustments/Labs and Tests Ordered: Current medicines are reviewed at length with the patient today.  Concerns regarding medicines are outlined above.  Orders Placed This Encounter  Procedures   Direct LDL   CARDIAC EVENT MONITOR   No orders of the defined types were placed in this encounter.   Patient Instructions  Medication Instructions:  No changes *If you need a refill on your cardiac medications before your next appointment, please call your pharmacy*   Lab Work: Direct LDL- Fasting lab work in 2 months If you have labs (blood work) drawn today and your tests are completely normal, you will receive your results only by: MyChart Message (if you have MyChart) OR A paper copy in the mail If you have any lab test that is abnormal or we need to change your treatment, we will call you to review the results.   Testing/Procedures: Your physician has recommended that you wear an event monitor. Event monitors are medical devices that record the heart's electrical activity. Doctors most often [place] these monitors to diagnose arrhythmias. Arrhythmias are problems with the speed or rhythm of the heartbeat. The monitor is a small, portable device. You can wear one while you do your normal daily activities. This is usually used to diagnose what is causing palpitations/syncope (passing out).     Follow-Up: At [facility], you and your health needs are our priority.  As part of our continuing mission to provide you with exceptional heart care, we have created designated Provider Care Teams.  These Care Teams include your primary Cardiologist (physician) and Advanced Practice Providers (APPs -  Physician Assistants and Nurse Practitioners) who all work together to provide you with the care you need, when you need it.  We recommend signing up for the patient portal called "MyChart".  Sign up information is provided on this After Visit Summary.  MyChart is used to connect with patients for Virtual Visits (Telemedicine).  Patients are able to view lab/test results, encounter notes, upcoming appointments, etc.  Non-urgent messages can be sent to your provider as well.   To learn more about what you can do with MyChart, go to [email or web address].    Your next appointment:   6 month(s)  Provider:   Dr Wyline Mood     Signed, Maisie Fus, MD  05/11/2022 12:06 PM    [facility]

## 2022-05-11 NOTE — Patient Instructions (Signed)
Medication Instructions:  No changes *If you need a refill on your cardiac medications before your next appointment, please call your pharmacy*   Lab Work: Direct LDL- Fasting lab work in 2 months If you have labs (blood work) drawn today and your tests are completely normal, you will receive your results only by: MyChart Message (if you have MyChart) OR A paper copy in the mail If you have any lab test that is abnormal or we need to change your treatment, we will call you to review the results.   Testing/Procedures: Your physician has recommended that you wear an event monitor. Event monitors are medical devices that record the heart's electrical activity. Doctors most often [place] these monitors to diagnose arrhythmias. Arrhythmias are problems with the speed or rhythm of the heartbeat. The monitor is a small, portable device. You can wear one while you do your normal daily activities. This is usually used to diagnose what is causing palpitations/syncope (passing out).    Follow-Up: At [facility], you and your health needs are our priority.  As part of our continuing mission to provide you with exceptional heart care, we have created designated Provider Care Teams.  These Care Teams include your primary Cardiologist (physician) and Advanced Practice Providers (APPs -  Physician Assistants and Nurse Practitioners) who all work together to provide you with the care you need, when you need it.  We recommend signing up for the patient portal called "MyChart".  Sign up information is provided on this After Visit Summary.  MyChart is used to connect with patients for Virtual Visits (Telemedicine).  Patients are able to view lab/test results, encounter notes, upcoming appointments, etc.  Non-urgent messages can be sent to your provider as well.   To learn more about what you can do with MyChart, go to [email or web address].    Your next appointment:   6 month(s)  Provider:   Dr Wyline Mood

## 2022-05-11 NOTE — Addendum Note (Signed)
Addended by: Dorris Fetch on: 05/11/2022 03:51 PM   Modules accepted: Orders

## 2022-05-12 ENCOUNTER — Telehealth: Payer: Self-pay | Admitting: Internal Medicine

## 2022-05-12 ENCOUNTER — Telehealth: Payer: Self-pay

## 2022-05-12 NOTE — Telephone Encounter (Signed)
Pt has been informed that 05/24/22 RTW letters are ready for pick up.   Pt stated the forms from his employer would be faxed to the office.

## 2022-05-12 NOTE — Telephone Encounter (Signed)
No, he can RTW on 05/20  TJ

## 2022-05-12 NOTE — Telephone Encounter (Signed)
We have received FMLA, to be filled out by provider. Patient requested to send it via Fax within 7-days. Document is located in providers tray at front office.Please advise at Mobile [phone] (mobile)   Please fax to: [phone]

## 2022-05-12 NOTE — Telephone Encounter (Signed)
Letter needed to clear pt to rtw on May 20th with his 2nd job.  **Pt is also needing paperwork filed out for his 1st job due to same reasons he was taken out of work for the CVA. Pt is needing Paperwork back dated to the May 6th until May 20th due to his vacation hours running out on the 6th of May and was  not cleared to rtw until the 20th.  ** Please call pt once paperwork has been filed and letter is ready for pick as hr is needing letter of clearance for both jobs unless PCP sees fit for pt to stay out to longer then please update paperwork and clearance letter with the date PCP wants to rtw.

## 2022-05-13 DIAGNOSIS — Z0279 Encounter for issue of other medical certificate: Secondary | ICD-10-CM

## 2022-05-13 NOTE — Telephone Encounter (Signed)
Forms have been signed and faxed back to employer.  Copy sent to charge.  Original filed with CMA for 30days from today then sent to scan.

## 2022-05-13 NOTE — Telephone Encounter (Signed)
Forms have been completed and given to PCP to review and sign.  ?

## 2022-05-14 ENCOUNTER — Telehealth (HOSPITAL_BASED_OUTPATIENT_CLINIC_OR_DEPARTMENT_OTHER): Payer: Self-pay | Admitting: *Deleted

## 2022-05-14 NOTE — Telephone Encounter (Signed)
Left message for patient to call and schedule the Echocardiogram ordered by Dr. Thomas Jones 

## 2022-05-17 ENCOUNTER — Other Ambulatory Visit: Payer: Self-pay

## 2022-05-17 ENCOUNTER — Ambulatory Visit: Payer: BC Managed Care – PPO | Admitting: Speech Pathology

## 2022-05-17 ENCOUNTER — Encounter: Payer: Self-pay | Admitting: Speech Pathology

## 2022-05-17 ENCOUNTER — Ambulatory Visit: Payer: BC Managed Care – PPO | Attending: Internal Medicine | Admitting: Occupational Therapy

## 2022-05-17 DIAGNOSIS — R278 Other lack of coordination: Secondary | ICD-10-CM

## 2022-05-17 DIAGNOSIS — R41841 Cognitive communication deficit: Secondary | ICD-10-CM

## 2022-05-17 DIAGNOSIS — I63313 Cerebral infarction due to thrombosis of bilateral middle cerebral arteries: Secondary | ICD-10-CM | POA: Insufficient documentation

## 2022-05-17 DIAGNOSIS — I69351 Hemiplegia and hemiparesis following cerebral infarction affecting right dominant side: Secondary | ICD-10-CM | POA: Diagnosis not present

## 2022-05-17 DIAGNOSIS — M6281 Muscle weakness (generalized): Secondary | ICD-10-CM | POA: Diagnosis not present

## 2022-05-17 DIAGNOSIS — R471 Dysarthria and anarthria: Secondary | ICD-10-CM | POA: Diagnosis not present

## 2022-05-17 DIAGNOSIS — R208 Other disturbances of skin sensation: Secondary | ICD-10-CM

## 2022-05-17 NOTE — Therapy (Signed)
OUTPATIENT SPEECH LANGUAGE PATHOLOGY EVALUATION   Patient Name: Jesse Duncan MRN: [id number] DOB:Jan 21, 1985, 37 y.o., male Today's Date: 05/17/2022  PCP: [name], MD REFERRING PROVIDER: [name], MD  END OF SESSION:  End of Session - 05/17/22 1213     Visit Number 1    Number of Visits 9    Date for SLP Re-Evaluation 07/12/22    Authorization Type BCBS    SLP Start Time 1100    SLP Stop Time  1138    SLP Time Calculation (min) 38 min    Activity Tolerance Patient tolerated treatment well             Past Medical History:  Diagnosis Date   Allergic rhinitis due to pollen 07/06/2010   Atrial fibrillation (HCC) 08/15/2015   Diabetes mellitus without complication (HCC)    Dyslipidemia    Hyperlipidemia 05/25/2010   Hypertension    Impaired fasting blood sugar 08/15/2015   Obesity    OSA (obstructive sleep apnea) 10/20/2015   Prediabetes    Sleep apnea    Smoker    Tobacco use disorder 11/14/2013   History reviewed. No pertinent surgical history. Patient Active Problem List   Diagnosis Date Noted   Need for vaccination 05/03/2022   Class 2 severe obesity due to excess calories with serious comorbidity and body mass index (BMI) of 35.0 to 35.9 in adult [facility]) 05/03/2022   Deficiency anemia 05/03/2022   Stroke (HCC) 04/22/2022   Abnormal electrocardiogram (ECG) (EKG) 04/08/2022   Chronic combined systolic and diastolic heart failure (HCC) 09/28/2021   Tobacco use 09/28/2021   Insulin-requiring or dependent type II diabetes mellitus (HCC) 06/02/2021   Primary hypertension 06/02/2021   Hyperlipidemia associated with type 2 diabetes mellitus (HCC) 12/04/2018   OSA (obstructive sleep apnea) 10/20/2015   Atrial fibrillation (HCC) 08/15/2015   Hypogonadism male 07/12/2013   Allergic rhinitis due to pollen 07/06/2010    ONSET DATE: 04/22/2022   REFERRING DIAG: W09.811 (ICD-10-CM) - Cerebrovascular accident (CVA) due to bilateral thrombosis of middle  cerebral arteries (HCC)   THERAPY DIAG:  Cerebrovascular accident (CVA) due to bilateral thrombosis of middle cerebral arteries (HCC)  Rationale for Evaluation and Treatment: Rehabilitation  SUBJECTIVE:   SUBJECTIVE STATEMENT: Reports ongoing improvement since d/c from hospital 04/23/2022 Pt accompanied by: self  PERTINENT HISTORY: Per MD note "Patient is a 37 y.o.  male with history of HFrEF, PAF, HTN, HLD, DM-2, tobacco use who was referred to the ED by his PCP as outpatient MRI showed acute CVA.  Patient was having right upper extremity weakness, right facial weakness and dysarthria for the past several day."   PAIN:  Are you having pain? No  FALLS: Has patient fallen in last 6 months?  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 He endorses facial *mandibular/maxillary branch* numbness yesterday that has improved as well as waxing/waning speech difficulties. No focal CN deficits present - and pt's phonation as well as receptive language is intact. SLUMS *[employer] Mental Status exam completed with pt scoring 26/30 - indicative of mild cog deficits per authors : 27-30 normal, 21-26 mild  disorder, 1-20 severe. Strengths noted in areas of orientation, memory and visuospatial skills. Functional math question challenging for pt - correctly answered with visual cues- and pt reports this to be a new change. Expressive language deficits noted resulting in dysfluent output with novel communication due to word finding deficits. Pt is amenable to cues including  phonemic cues, sentence completion, phrase completion, etc. Repetition at complex multisyllabic area resulted in frequent breakdown for pt. Reviewed findings and recommendations with pt using teach back. Follow up SLP at next venue of care indicated to maximize pt's cognitive linguistic skills especially given hefty demands in his life. 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 Pt agreeable to plan. Will follow up while pt in -house for treatment and family education.   COGNITION: Overall cognitive status: Impaired Areas of impairment:  Attention: Impaired: Comment: ADHD per pt report Memory: Impaired: Working Teacher, music term Designer, fashion/clothing function: Impaired: Problem solving, Organization, Planning, and Slow processing Functional deficits: high level cognitive and expressive communication needs at work and at home. Pt is a Merchandiser, retail, working two jobs with three young children at home.   MOTOR SPEECH: assessed across variety of speech tasks: reading, word repetition, generative discourse sample Overall motor speech: impaired Level of impairment: Conversation Rate of Speech: WFL Dysfluencies: stutter like dysfluencies (sound/syllable repetitions) Phonation: normal Oral reading loudness average: 70 dB Conversational loudness average: 70 dB Voice Quality: normal Respiration: thoracic breathing Word and Phrasal Stress: WFL Resonance: WFL Articulation: Appears intact Diadochokinetic Rate (DDK): unremarkable Intelligibility: Intelligible Motor planning: Appears intact Interfering components:  none Effective technique: slow rate and pacing  ORAL MOTOR EXAMINATION: Overall status: WFL  STANDARDIZED ASSESSMENTS: Deferred for motor speech assessment and recent administration of SLUMS, standardized cognitive assessment may be indicated based on progress/observations in therapy  PATIENT REPORTED OUTCOME MEASURES (PROM): Cognitive Function: to  be completed initial session, preferably once pt has returned to work and cognitive load has increased  TODAY'S TREATMENT:                                                                                                                                         DATE:  05/17/22: Education provided on evaluation results and SLP's recommendations. Initiated training re: fluency strategies (calming breath, pacing). Pt verbalizes agreement with POC, all questions answered to satisfaction.    PATIENT EDUCATION: Education details: see treatment section Person educated: Patient Education method: Explanation, Demonstration, and Handouts Education comprehension: verbalized understanding, returned demonstration, and needs further education   GOALS: Goals reviewed with patient? Yes  SHORT TERM GOALS: Target date: 06/14/2022  Pt will complete cognitive assessment, if indicated based on clinical judgement Baseline: Goal status: INITIAL  2.  Pt will demonstrate dysarthria/fluency strategies in moderately complex generative speech or conversational speech tasks with rare min-A Baseline:  Goal status: INITIAL  3.  Pt will  verbalize appropriate cognitive strategy to support successful completion of x3 functional home or work based tasks Baseline:  Goal status: INITIAL   LONG TERM GOALS: Target date: 07/12/2022  Pt will verbalize x3 cognitive compensations (attention, memory, executive function) he has implemented at work, resulting in successful performance of vocational duties Baseline:  Goal status: INITIAL  2.  Pt will give 15 minute mock presentation on work related topic with successful use of dysarthria strategies and compensations PRN Baseline:  Goal status: INITIAL  3.  Pt will report subjective reduction in instances of "giving up" when communicating with spouse or friends over 1 week period Baseline: always when dysfluencies occur, per pt reprot Goal status: INITIAL  4.  Pt will report  improved cognitive function via PROM by dc  Baseline:  Goal status: INITIAL  ASSESSMENT:  CLINICAL IMPRESSION: Patient is a 37 y.o. M who was seen today for cognitive linguistic evaluation s/p stroke. Evaluation reveals mild dysarthria and mild impairments in attention (premorbid ADHD), memory, and executive function. Occasional anomia evidenced which repairs with minimal delay with pt self-cueing using gestures or description. Pt's speech is c/b dysfluencies which wax and wane based on undetermined factors. Pt reports high communication demands at work, as he gives presentations and as a Merchandiser, retail must successful communicate complex ideas to variety of communication partners. Pt would benefit from skilled ST to address aforementioned deficits to facilitate return to PLOF and maximize communication confidence and efficacy.   OBJECTIVE IMPAIRMENTS: include attention, memory, executive functioning, expressive language, and dysarthria. These impairments are limiting patient from effectively communicating at home and in community and maximizing performance in vocational duties . Factors affecting potential to achieve goals and functional outcome are  none evidenced . Patient will benefit from skilled SLP services to address above impairments and improve overall function.  REHAB POTENTIAL: Good  PLAN:  SLP FREQUENCY: 1x/week  SLP DURATION: 8 weeks  PLANNED INTERVENTIONS: Language facilitation, Cueing hierachy, Cognitive reorganization, Internal/external aids, Functional tasks, SLP instruction and feedback, Compensatory strategies, and Patient/family education  Maia Breslow, CCC-SLP 05/17/2022, 12:13 PM

## 2022-05-17 NOTE — Therapy (Signed)
OUTPATIENT OCCUPATIONAL THERAPY NEURO EVALUATION  Patient Name: Jesse Duncan MRN: [id number] DOB:07-17-1985, 37 y.o., male Today's Date: 05/17/2022  PCP: [name], MD REFERRING PROVIDER: [name], MD  END OF SESSION:  OT End of Session - 05/17/22 1331     Visit Number 1    Number of Visits 13    Date for OT Re-Evaluation 07/02/22    Authorization Type [employer] PPO    OT Start Time 1015    OT Stop Time 1100    OT Time Calculation (min) 45 min    Behavior During Therapy Flat affect             Past Medical History:  Diagnosis Date   Allergic rhinitis due to pollen 07/06/2010   Atrial fibrillation (HCC) 08/15/2015   Diabetes mellitus without complication (HCC)    Dyslipidemia    Hyperlipidemia 05/25/2010   Hypertension    Impaired fasting blood sugar 08/15/2015   Obesity    OSA (obstructive sleep apnea) 10/20/2015   Prediabetes    Sleep apnea    Smoker    Tobacco use disorder 11/14/2013   No past surgical history on file. Patient Active Problem List   Diagnosis Date Noted   Need for vaccination 05/03/2022   Class 2 severe obesity due to excess calories with serious comorbidity and body mass index (BMI) of 35.0 to 35.9 in adult [facility]) 05/03/2022   Deficiency anemia 05/03/2022   Stroke (HCC) 04/22/2022   Abnormal electrocardiogram (ECG) (EKG) 04/08/2022   Chronic combined systolic and diastolic heart failure (HCC) 09/28/2021   Tobacco use 09/28/2021   Insulin-requiring or dependent type II diabetes mellitus (HCC) 06/02/2021   Primary hypertension 06/02/2021   Hyperlipidemia associated with type 2 diabetes mellitus (HCC) 12/04/2018   OSA (obstructive sleep apnea) 10/20/2015   Atrial fibrillation (HCC) 08/15/2015   Hypogonadism male 07/12/2013   Allergic rhinitis due to pollen 07/06/2010    ONSET DATE: 04/22/22  REFERRING DIAG: G38.756 (ICD-10-CM) - Cerebrovascular accident (CVA) due to bilateral thrombosis of middle cerebral arteries  THERAPY DIAG:   Hemiplegia and hemiparesis following cerebral infarction affecting right dominant side (HCC)  Other lack of coordination  Other disturbances of skin sensation  Muscle weakness (generalized)  Rationale for Evaluation and Treatment: Rehabilitation  SUBJECTIVE:   SUBJECTIVE STATEMENT: Pt reports when he had the stroke he started stumbling over his words while he was at work.  Pt then reports feeling his face and arm going numb.  MD set him up for MRI where they noted infarcts.  Pt reports speech is still difficult, "knowing what to say but can't get it out" and stuttering comes and goes as he fatigues.  Pt reports arm still somewhat numb but when up and moving that he can maintain grasp but other times he still has difficulty with grasp and manipulation with pen for handwriting.  Pt reports still having difficulty with peripheral vision on R side.   Pt accompanied by: significant other  PERTINENT HISTORY: 37 y/o M admitted to [facility] on 4/18 for R arm weakness/numbness and speech problem. MRI shows early subacute large and medium sized vessel time infarct in both left MCA and left PCA. Cytotoxic edema with minor petechial hemorrhage in left occipital lobe. PMHx: PAF, HTN, chronic combined HFrEF and HFpEF, HLD, IDDM, cigarette smoking.  PRECAUTIONS: None  WEIGHT BEARING RESTRICTIONS: No  PAIN:  Are you having pain? No, tingling in R hand  FALLS: Has patient fallen in last 6 months? No  LIVING ENVIRONMENT:  Lives with: lives with their family (spouse and 3 children) Lives in: House/apartment Stairs: Yes: Internal: 17 steps; on right going up Has following equipment at home:  hand held shower head  PLOF: Independent, Independent with basic ADLs, and Vocation/Vocational requirements: works at [employer], works 2 jobs  Merchandiser, retail for [employer].  Pt reports that he felt like he was outgoing and had a sense of humor and now feels that is gone.    PATIENT GOALS: to get my life back, function every  day, getting the hand together  OBJECTIVE:   HAND DOMINANCE: Right  ADLs: Overall ADLs: Reports initial difficulty with clothing fasteners, however all Mod I - Independent now Transfers/ambulation related to ADLs: Independent  IADLs: Wife would complete all IADLs, pt did drive prior to CVA but has not since Medication management: "take them every day now" Handwriting: Increased time Pt reports "its a struggle"   MOBILITY STATUS: Independent  POSTURE COMMENTS:  No Significant postural limitations  ACTIVITY TOLERANCE: Activity tolerance: RUE fatigued with box and blocks assessment  FUNCTIONAL OUTCOME MEASURES: FOTO: 55 on eval, predicted 75 at d/c  UPPER EXTREMITY ROM:  WFL bilaterally  UPPER EXTREMITY MMT:   WFL bilaterally  HAND FUNCTION: Grip strength: Right: 36 lbs; Left: 90 lbs, Lateral pinch: Right: 9 lbs, Left: 26 lbs, and 3 point pinch: Right: 7 lbs, Left: 21 lbs  COORDINATION: Finger Nose Finger test: very mild slowing on R compared to L 9 Hole Peg test: Right: 29.12 sec; Left: 22.38 sec Box and Blocks:  Right 47 blocks, Left 55 blocks  SENSATION: Light touch: WFL Stereognosis: WFL Pt does report inconsistent sensation, and tingling in R hand  COGNITION: Overall cognitive status: Within functional limits for tasks assessed Some aphasia and difficulty with awareness and insight.  VISION: Subjective report: noticing intermittent R inattention, blurriness in R visual field Baseline vision: No visual deficits  VISION ASSESSMENT: Tracking/Visual pursuits: Decreased smoothness with horizontal tracking Visual Fields: Right visual field deficits  Patient has difficulty with following activities due to following visual impairments: Pt nearly ran into doorframe on R of body when ambulating from waiting room into treatment gym.  Pt with decreased spacing with line bisection 1/4 and with planning when completing clock drawing.  PERCEPTION: Impaired:  Inattention/neglect: Impaired- to be further tested in functional concext and Spatial orientation: Pt with decreased spacing with line bisection 1/4 and with planning when completing clock drawing.  OBSERVATIONS: flat affect.  Pt reporting noticing change in mood and motivation.   TODAY'S TREATMENT:                                                                                          05/17/22  Educated on "pen tricks" with pt completing each with mild-mod difficulty.  Encouraged completion a few times per day to increase motor control as needed for ADLs and handwriting.  Provided with handout. Educated on "lighthouse" technique with visual scanning and head turns to compensate for decreased visual attention of R environment. Provided with handout.  PATIENT EDUCATION: Education details: Educated on role and purpose of OT as well as potential interventions and goals for therapy based  on initial evaluation findings. Person educated: Patient Education method: Explanation, Demonstration, and Handouts Education comprehension: verbalized understanding and needs further education  HOME EXERCISE PROGRAM: Access Code: [email or web address] URL: [email or web address] Date: 05/17/2022 Prepared by: [facility] - [facility]  Patient Education - Lighthouse Strategy  In-Hand Manipulation Skills Rotation:  Hold pen, try to "twirl" like a baton, keeping flat with surface of table. Try going BOTH directions 10x For increased challenge, can complete with coin.  Flip:  Hold pen in writing position, flip in an arch to "erase" position, then back to "write" position. Do not lift hand off table.  10x   Translation:  Open hand palm up, put an object in your palm and then use your fingers and thumb to move it to the tips of your fingers, pinched against your thumb.  10x (Bigger is easier (fat marker), smaller is harder (penny))  Shift:  Hold pen like a dart, start "shifting" it  forward until you are holding it at the base, then shift it backwards until you are holding it at the tip again. 10x (like putting a key in a key hole)    GOALS: Goals reviewed with patient? No  SHORT TERM GOALS: Target date: 06/11/22  Pt will be independent with fine and gross motor control HEP. Baseline: Goal status: INITIAL  2.  Pt will be independent in hand strengthening HEP. Baseline:  Goal status: INITIAL  3.  Pt will verbalize understanding of compensatory strategies for impaired visual attention. Baseline:  Goal status: INITIAL   LONG TERM GOALS: Target date: 07/02/22  Pt will demonstrate improved fine motor coordination for ADLs as evidenced by decreasing 9 hole peg test score for RUE by 4 secs Baseline: Right: 29.12 sec; Left: 22.38 sec Goal status: INITIAL  2.  Pt will demonstrate improved UE functional use for ADLs as evidenced by increasing box/ blocks score by 5 blocks with RUE Baseline: Right 47 blocks, Left 55 blocks Goal status: INITIAL  3.  Pt will demonstrate improved grip strength by 5# to allow for increased ease and safety with opening refrigerator and food containers. Baseline: Right: 36 lbs; Left: 90 lbs Goal status: INITIAL  4.  Pt will verbalize understanding of return to driving recommendations. Baseline:  Goal status: INITIAL  5.  Pt will verbalize understanding of return to work recommendations and readiness. Baseline:  Goal status: INITIAL  6.  Pt will write and/or type 3-5 sentence paragraph in <5 mins with 90% accuracy. Baseline:  Goal status: INITIAL  ASSESSMENT:  CLINICAL IMPRESSION: Patient is a 37 y.o. male who was seen today for occupational therapy evaluation for impairments impacting ADLs/IADLs s/p CVA. Pt reports and demonstrates ongoing impairments with strength, sensation, coordination, and vision impacting ability to complete IADLs at PLOF.  Pt lives with spouse and 3 children in house with bedroom/bathroom on 2nd floor with  17 steps to 2nd floor.  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  Pt will benefit from skilled occupational therapy services to address strength and coordination, altered sensation, balance, GM/FM control, cognition, safety awareness, introduction of compensatory strategies/AE prn, visual-perception, and implementation of an HEP to improve participation and safety during ADLs and IADLs.   PERFORMANCE DEFICITS: in functional skills including ADLs, IADLs, coordination, sensation, strength, Fine motor control, Gross motor control, endurance, decreased knowledge of precautions, decreased knowledge of use of DME, vision, and UE functional use, cognitive skills including energy/drive  and sequencing, and psychosocial skills including environmental adaptation and routines and behaviors.   IMPAIRMENTS: are limiting patient from IADLs, work, and social participation.   CO-MORBIDITIES: may have co-morbidities  that affects occupational performance. Patient will benefit from skilled OT to address above impairments and improve overall function.  MODIFICATION OR ASSISTANCE TO COMPLETE EVALUATION: No modification of tasks or assist necessary to complete an evaluation.  OT OCCUPATIONAL PROFILE AND HISTORY: Detailed assessment: Review of records and additional review of physical, cognitive, psychosocial history related to current functional performance.  CLINICAL DECISION MAKING: LOW - limited treatment options, no task modification necessary  REHAB POTENTIAL: Good  EVALUATION COMPLEXITY: Low    PLAN:  OT FREQUENCY: 1-2x/week  OT DURATION: 6 weeks  PLANNED INTERVENTIONS: self care/ADL training, therapeutic exercise, therapeutic activity, neuromuscular re-education, passive range of motion, functional mobility training, ultrasound, moist heat, cryotherapy, patient/family education, visual/perceptual remediation/compensation, psychosocial skills  training, energy conservation, coping strategies training, and DME and/or AE instructions  RECOMMENDED OTHER SERVICES: NA  CONSULTED AND AGREED WITH PLAN OF CARE: Patient  PLAN FOR NEXT SESSION: review lighthouse strategy and "pen tricks".  Initiate grip strengthening with theraputty (yellow vs red).  Discuss stroke association return to work recommendations/check list.   Rosalio Loud, OTR/L 05/17/2022, 1:32 PM

## 2022-05-18 ENCOUNTER — Ambulatory Visit: Payer: Self-pay

## 2022-05-18 NOTE — Patient Instructions (Signed)
Visit Information  Thank you for taking time to visit with me today. Please don't hesitate to contact me if I can be of assistance to you.   Following are the goals we discussed today:   Goals Addressed             This Visit's Progress    Care Coordination activities: continue to improved post hospitalization       Interventions Today    Flowsheet Row Most Recent Value  Chronic Disease   Chronic disease during today's visit Diabetes, Other  [CVA]  General Interventions   General Interventions Discussed/Reviewed General Interventions Reviewed, Doctor Visits, Communication with  Doctor Visits Discussed/Reviewed Doctor Visits Discussed, PCP, Specialist  PCP/Specialist Visits Compliance with follow-up visit  [reviewed provider instructions for PCP visit(05/03/22) and cardiology visit(05/11/22)]  Communication with PCP/Specialists  [message sent re: clarification of Jardiance]  Education Interventions   Education Provided Provided Education, Provided Web-based Education  [emmi education video: Diabetes, type 2 and nutrition and healthy eating]  Provided Verbal Education On Nutrition, Medication, Blood Sugar Monitoring  Pharmacy Interventions   Pharmacy Dicussed/Reviewed Medications and their functions, Medication Adherence, Pharmacy Topics Reviewed            Our next appointment is by telephone on 06/17/21 at 10:00 am  Please call the care guide team at [phone] if you need to cancel or reschedule your appointment.   If you are experiencing a Mental Health or Behavioral Health Crisis or need someone to talk to, please call the Suicide and Crisis Lifeline: 22  [name], RN, MSN, BSN, CCM [facility] Care Coordinator [phone]

## 2022-05-18 NOTE — Telephone Encounter (Signed)
Left message for patient to call and discuss scheduling the echocardiogram ordered by Dr. Thomas Jones 

## 2022-05-18 NOTE — Patient Outreach (Addendum)
  Care Coordination   Follow Up Visit Note   05/18/2022 Name: Jesse Duncan MRN: [id number] DOB: [birth year]  Jesse Duncan is a 37 y.o. year old male who sees [name], MD for primary care. I spoke with  Jesse Duncan by phone today.  What matters to the patients health and wellness today?  Jesse Duncan reports he is doing well. He states he has picked up dexcom and is using. He reports fasting blood sugar this morning 120.. per review of cardiology visit assessment note, patient to continue Jardiance. However patient reports is not taken Jardiance and states has not taken in about 6 years. He reports blood sugar this morning was 120. He is attending outpatient OT/SP therapy sessions and is without any questions or concerns at this time.  Goals Addressed             This Visit's Progress    Care Coordination activities: continue to improved post hospitalization       Interventions Today    Flowsheet Row Most Recent Value  Chronic Disease   Chronic disease during today's visit Diabetes, Other  [CVA]  General Interventions   General Interventions Discussed/Reviewed General Interventions Reviewed, Doctor Visits, Communication with  Doctor Visits Discussed/Reviewed Doctor Visits Discussed, PCP, Specialist  PCP/Specialist Visits Compliance with follow-up visit  [reviewed provider instructions for PCP visit(05/03/22) and cardiology visit(05/11/22)]  Communication with PCP/Specialists  [message sent re: clarification of Jardiance]  Education Interventions   Education Provided Provided Education, Provided Web-based Education  [emmi education video: Diabetes, type 2 and nutrition and healthy eating]  Provided Verbal Education On Nutrition, Medication, Blood Sugar Monitoring  Pharmacy Interventions   Pharmacy Dicussed/Reviewed Medications and their functions, Medication Adherence, Pharmacy Topics Reviewed            SDOH assessments and interventions completed:  No  Care  Coordination Interventions:  Yes, provided   Follow up plan: Follow up call scheduled for 06/18/22    Encounter Outcome:  Pt. Visit Completed   [name], RN, MSN, BSN, [facility] Care Coordinator [phone]   05/18/22 5:00 pm Addendum: received message from Dr. Wyline Mood: "It is not critical, I can discuss on his follow up appointment. Patient notified.

## 2022-05-19 ENCOUNTER — Other Ambulatory Visit: Payer: Self-pay | Admitting: Internal Medicine

## 2022-05-19 DIAGNOSIS — I639 Cerebral infarction, unspecified: Secondary | ICD-10-CM | POA: Diagnosis not present

## 2022-05-19 DIAGNOSIS — I480 Paroxysmal atrial fibrillation: Secondary | ICD-10-CM

## 2022-05-19 DIAGNOSIS — I4891 Unspecified atrial fibrillation: Secondary | ICD-10-CM | POA: Diagnosis not present

## 2022-05-19 DIAGNOSIS — I10 Essential (primary) hypertension: Secondary | ICD-10-CM

## 2022-05-19 DIAGNOSIS — I5042 Chronic combined systolic (congestive) and diastolic (congestive) heart failure: Secondary | ICD-10-CM

## 2022-05-20 ENCOUNTER — Emergency Department (HOSPITAL_COMMUNITY): Payer: BC Managed Care – PPO

## 2022-05-20 ENCOUNTER — Encounter (HOSPITAL_COMMUNITY): Payer: Self-pay

## 2022-05-20 ENCOUNTER — Emergency Department (HOSPITAL_COMMUNITY)
Admission: EM | Admit: 2022-05-20 | Discharge: 2022-05-20 | Disposition: A | Discharge status: Home / Self Care | Payer: BC Managed Care – PPO | Attending: Emergency Medicine | Admitting: Emergency Medicine

## 2022-05-20 ENCOUNTER — Other Ambulatory Visit: Payer: Self-pay

## 2022-05-20 DIAGNOSIS — Z7902 Long term (current) use of antithrombotics/antiplatelets: Secondary | ICD-10-CM | POA: Diagnosis not present

## 2022-05-20 DIAGNOSIS — E119 Type 2 diabetes mellitus without complications: Secondary | ICD-10-CM | POA: Insufficient documentation

## 2022-05-20 DIAGNOSIS — I5020 Unspecified systolic (congestive) heart failure: Secondary | ICD-10-CM | POA: Insufficient documentation

## 2022-05-20 DIAGNOSIS — R202 Paresthesia of skin: Secondary | ICD-10-CM | POA: Diagnosis not present

## 2022-05-20 DIAGNOSIS — Z8673 Personal history of transient ischemic attack (TIA), and cerebral infarction without residual deficits: Secondary | ICD-10-CM | POA: Diagnosis not present

## 2022-05-20 DIAGNOSIS — Z794 Long term (current) use of insulin: Secondary | ICD-10-CM | POA: Diagnosis not present

## 2022-05-20 DIAGNOSIS — R200 Anesthesia of skin: Secondary | ICD-10-CM | POA: Diagnosis not present

## 2022-05-20 DIAGNOSIS — Z7982 Long term (current) use of aspirin: Secondary | ICD-10-CM | POA: Insufficient documentation

## 2022-05-20 DIAGNOSIS — I110 Hypertensive heart disease with heart failure: Secondary | ICD-10-CM | POA: Diagnosis not present

## 2022-05-20 DIAGNOSIS — I10 Essential (primary) hypertension: Secondary | ICD-10-CM | POA: Diagnosis not present

## 2022-05-20 DIAGNOSIS — Z7984 Long term (current) use of oral hypoglycemic drugs: Secondary | ICD-10-CM | POA: Insufficient documentation

## 2022-05-20 DIAGNOSIS — Z79899 Other long term (current) drug therapy: Secondary | ICD-10-CM | POA: Insufficient documentation

## 2022-05-20 DIAGNOSIS — R29818 Other symptoms and signs involving the nervous system: Secondary | ICD-10-CM | POA: Diagnosis not present

## 2022-05-20 LAB — COMPREHENSIVE METABOLIC PANEL
ALT: 37 U/L (ref 0–44)
AST: 30 U/L (ref 15–41)
Albumin: 4.1 g/dL (ref 3.5–5.0)
Alkaline Phosphatase: 59 U/L (ref 38–126)
Anion gap: 13 (ref 5–15)
BUN: 16 mg/dL (ref 6–20)
CO2: 24 mmol/L (ref 22–32)
Calcium: 9.5 mg/dL (ref 8.9–10.3)
Chloride: 101 mmol/L (ref 98–111)
Creatinine, Ser: 0.79 mg/dL (ref 0.61–1.24)
GFR, Estimated: >60 mL/min (ref >60)
Glucose, Bld: 125 mg/dL — ABNORMAL HIGH (ref 70–99)
Potassium: 3.6 mmol/L (ref 3.5–5.1)
Sodium: 138 mmol/L (ref 135–145)
Total Bilirubin: 0.4 mg/dL (ref 0.3–1.2)
Total Protein: 7.3 g/dL (ref 6.5–8.1)

## 2022-05-20 LAB — DIFFERENTIAL
Abs Immature Granulocytes: 0.01 10*3/uL (ref 0.00–0.07)
Basophils Absolute: 0 10*3/uL (ref 0.0–0.1)
Basophils Relative: 1 %
Eosinophils Absolute: 0.2 10*3/uL (ref 0.0–0.5)
Eosinophils Relative: 3 %
Immature Granulocytes: 0 %
Lymphocytes Relative: 48 %
Lymphs Abs: 2.7 10*3/uL (ref 0.7–4.0)
Monocytes Absolute: 0.4 10*3/uL (ref 0.1–1.0)
Monocytes Relative: 7 %
Neutro Abs: 2.3 10*3/uL (ref 1.7–7.7)
Neutrophils Relative %: 41 %

## 2022-05-20 LAB — CBC
HCT: 38.9 % — ABNORMAL LOW (ref 39.0–52.0)
Hemoglobin: 12.2 g/dL — ABNORMAL LOW (ref 13.0–17.0)
MCH: 27 pg (ref 26.0–34.0)
MCHC: 31.4 g/dL (ref 30.0–36.0)
MCV: 86.1 fL (ref 80.0–100.0)
Platelets: 314 10*3/uL (ref 150–400)
RBC: 4.52 MIL/uL (ref 4.22–5.81)
RDW: 12.8 % (ref 11.5–15.5)
WBC: 5.6 10*3/uL (ref 4.0–10.5)
nRBC: 0 % (ref 0.0–0.2)

## 2022-05-20 LAB — I-STAT CHEM 8, ED
BUN: 17 mg/dL (ref 6–20)
Calcium, Ion: 1.16 mmol/L (ref 1.15–1.40)
Chloride: 101 mmol/L (ref 98–111)
Creatinine, Ser: 0.9 mg/dL (ref 0.61–1.24)
Glucose, Bld: 126 mg/dL — ABNORMAL HIGH (ref 70–99)
HCT: 39 % (ref 39.0–52.0)
Hemoglobin: 13.3 g/dL (ref 13.0–17.0)
Potassium: 3.4 mmol/L — ABNORMAL LOW (ref 3.5–5.1)
Sodium: 139 mmol/L (ref 135–145)
TCO2: 26 mmol/L (ref 22–32)

## 2022-05-20 LAB — ETHANOL: Alcohol, Ethyl (B): <10 mg/dL (ref <10)

## 2022-05-20 LAB — PROTIME-INR
INR: 1 (ref 0.8–1.2)
Prothrombin Time: 13.3 seconds (ref 11.4–15.2)

## 2022-05-20 LAB — APTT: aPTT: 26 seconds (ref 24–36)

## 2022-05-20 NOTE — Plan of Care (Signed)
Discussed briefly with ED PA Sarah Smooth  These are curbside recommendations based upon the information readily available in the chart on brief review as well as history and examination information provided to me by requesting provider and do not replace a full detailed consult  Per ED provider patient had a brief episode of right facial numbness and tingling in right calf which is nearly resolved.  Symptoms too mild to treat, nondisabling and he has had a full stroke workup recently  MRI brain negative for acute process.  Had a full stroke workup recently as planned for dual antiplatelet therapy Notably A1c 14.3%, LDL 189, smoking, severe intracranial atherosclerotic disease, recommended for dual antiplatelet therapy for 3 months  Recommend if patient has not been adherent to Plavix would load with 300 mg of Plavix and start 75 mg daily again for 60-month course, otherwise continue plan as listed in the last neurology note, continue to counsel medication adherence, diet, exercise, weight loss and smoking cessation if patient is still smoking  Please do not hesitate to reach out to neurology if additional questions or concerns arise   Brooke Dare MD-PhD [facility] [phone] Available 7 AM to 7 PM, outside these hours please contact Neurologist on call listed on AMION

## 2022-05-20 NOTE — ED Provider Triage Note (Addendum)
Emergency Medicine Provider Triage Evaluation Note  Jesse Duncan , a 37 y.o. male  was evaluated in triage.  Pt complains of numbness/tingling of the right face and right calf.  Patient states that he went to bed at baseline around 11 PM last night.  States that he woke around 630 this morning and noticed some tingling/numbness of right side of his face and right calf upon awakening.  Patient with history of CVA last month with residual deficits being some "stuttering" of speech and numbness of his right hand.  Reports compliance with at home medications.  Denies any visual disturbance from baseline, gait abnormality from baseline, weakness, gait abnormality, facial droop.  Review of Systems  Positive: See above Negative:   Physical Exam  BP (!) 158/101 Comment: RN notified  Pulse 89   Temp 99.2 F (37.3 C) (Oral)   Resp 18   SpO2 99%  Gen:   Awake, no distress   Resp:  Normal effort  MSK:   Moves extremities without difficulty  Other:  Patient with reported sensory deficits along V2 of cranial nerve V as well as in right calf area.  Otherwise, cranial nerves III through XII grossly intact.  Medical Decision Making  Medically screening exam initiated at 12:27 PM.  Appropriate orders placed.  [place] was informed that the remainder of the evaluation will be completed by another provider, this initial triage assessment does not replace that evaluation, and the importance of remaining in the ED until their evaluation is complete.    Peter Garter, [place] 05/20/22 1227   Radiology called with evidence of new potential hypodensity in right anterior temporal lobe.  MRI ordered.  Room requested.     Peter Garter, [place] 05/20/22 1357

## 2022-05-20 NOTE — Discharge Instructions (Signed)
As we discussed, your workup in the ER today was reassuring for acute findings.  MRI imaging of your brain and laboratory evaluation did not reveal any emergent concerns.  I have discussed your case with our on call neurologist to recommends that you call your neurology team at your earliest convenience and schedule an appointment closely outpatient.  Also please be sure to be taking your prescribed medications as prescribed every day monitor for any changes in your condition as this may warrant immediate return to the emergency department.

## 2022-05-20 NOTE — ED Provider Notes (Signed)
[facility] AT [facility] Provider Note   CSN: [id number] Arrival date & time: 05/20/22  1212     History  Chief Complaint  Patient presents with   Rt sided Facial & hand numbness    Jesse Duncan is a 37 y.o. male.  Patient with history of CVA 1 month ago, hypertension, hyperlipemia, afib, T2DM, HFrEF presents today with complaints of right facial and right lower leg numbness. He states that he went to bed around 11 pm last night in good health and when he woke up this morning around 6:30 am he noticed that his right middle face was numb as well as his right calf area of his lower leg. He states that these symptoms are similar to when he had a stroke 1 month ago but had resolved. These symptoms lasted for approximately 2 hours before beginning to improved around 8:30. Upon my evaluation, his calf numbness has completely resolved, however his right face numbness has continued. He denies any other symptoms including headache, vision changes, weakness, chest pain, shortness of breath, fevers, chills, or confusion. He is complaint with his Plavix.  The history is provided by the patient. No language interpreter was used.       Home Medications Prior to Admission medications   Medication Sig Start Date End Date Taking? Authorizing Provider  aspirin EC 325 MG tablet Take 1 tablet (325 mg total) by mouth daily. Swallow whole. Take with Plavix for 90 days, after 90 stop plavix and continue on Aspirin 04/23/22 04/23/23  Ghimire, Werner Lean, MD  carvedilol (COREG) 12.5 MG tablet TAKE 1 TABLET (12.5MG  TOTAL) BY MOUTH TWICE A DAY WITH MEALS 05/19/22   [name], MD  clopidogrel (PLAVIX) 75 MG tablet Take 1 tablet (75 mg total) by mouth daily. Take along with aspirin for 90 days and then stop Plavix, but continue on ASA 04/23/22 04/23/23  Ghimire, Werner Lean, MD  Continuous Glucose Receiver (DEXCOM G7 RECEIVER) DEVI 1 Act by Does not apply route daily. 05/03/22    [name], MD  Continuous Glucose Sensor (DEXCOM G7 SENSOR) MISC 1 Act by Does not apply route daily. 05/03/22   [name], MD  diltiazem (CARDIZEM CD) 300 MG 24 hr capsule TAKE 1 CAPSULE BY MOUTH EVERY DAY 09/28/21   Storm Frisk, MD  glucose blood test strip Use as instructed 09/28/21   Storm Frisk, MD  hydrALAZINE (APRESOLINE) 25 MG tablet Take 1 tablet (25 mg total) by mouth 3 (three) times daily. 05/03/22   [name], MD  insulin aspart (FIASP) 100 UNIT/ML FlexTouch Pen Inject 10 Units into the skin 3 (three) times daily with meals. 04/10/22   [name], MD  insulin glargine, 1 Unit Dial, (TOUJEO SOLOSTAR) 300 UNIT/ML Solostar Pen Inject 40 Units into the skin daily. 04/10/22   [name], MD  Insulin Pen Needle (BD PEN NEEDLE NANO U/F) 32G X 4 MM MISC 1 Act by Subdermal route in the morning, at noon, in the evening, and at bedtime. Use to inject insulin once daily 04/10/22   [name], MD  metFORMIN (GLUCOPHAGE) 1000 MG tablet Take 1 tablet (1,000 mg total) by mouth 2 (two) times daily with a meal. 04/10/22   [name], MD  nicotine polacrilex (NICORETTE MINI) 4 MG lozenge Use 4mg  three times a day to stop smoking Patient not taking: Reported on 05/17/2022 09/28/21   Storm Frisk, MD  pantoprazole (PROTONIX) 40 MG  tablet Take 1 tablet (40 mg total) by mouth daily. 04/23/22 04/23/23  Ghimire, Werner Lean, MD  rosuvastatin (CRESTOR) 40 MG tablet Take 1 tablet (40 mg total) by mouth daily. 04/23/22 10/20/22  Ghimire, Werner Lean, MD  Semaglutide (RYBELSUS) 7 MG TABS Take 7 mg by mouth daily. 09/28/21   Storm Frisk, MD  valsartan-hydrochlorothiazide (DIOVAN-HCT) 320-25 MG tablet Take 1 tablet by mouth daily. 09/28/21   Storm Frisk, MD      Allergies    Patient has no known allergies.    Review of Systems   Review of Systems  Neurological:  Positive for numbness.  All other systems reviewed and are negative.   Physical Exam Updated Vital  Signs BP (!) 158/101 Comment: RN notified  Pulse 89   Temp 99.2 F (37.3 C) (Oral)   Resp 18   SpO2 99%  Physical Exam Vitals and nursing note reviewed.  Constitutional:      General: He is not in acute distress.    Appearance: Normal appearance. He is normal weight. He is not ill-appearing, toxic-appearing or diaphoretic.  HENT:     Head: Normocephalic and atraumatic.  Eyes:     Extraocular Movements: Extraocular movements intact.     Pupils: Pupils are equal, round, and reactive to light.  Cardiovascular:     Rate and Rhythm: Normal rate and regular rhythm.     Heart sounds: Normal heart sounds.  Pulmonary:     Effort: Pulmonary effort is normal. No respiratory distress.     Breath sounds: Normal breath sounds.  Abdominal:     General: Abdomen is flat.     Palpations: Abdomen is soft.  Musculoskeletal:        General: Normal range of motion.     Cervical back: Normal range of motion.  Skin:    General: Skin is warm and dry.  Neurological:     Mental Status: He is alert and oriented to person, place, and time.     GCS: GCS eye subscore is 4. GCS verbal subscore is 5. GCS motor subscore is 6.     Sensory: Sensation is intact.     Motor: Motor function is intact.     Coordination: Coordination is intact.     Gait: Gait is intact.     Comments: Alert and oriented to self, place, time and event.    Speech is fluent, clear without dysarthria or dysphasia.    Strength 5/5 in upper/lower extremities   Sensation intact in upper/lower extremities   Ambulatory with steady gait   CN I not tested  CN II grossly intact visual fields bilaterally. Did not visualize posterior eye.  CN III, IV, VI PERRLA and EOMs intact bilaterally  CN V Subjective sensation change to the medial portion of the right face. Upper and lower portions of the right face, sensation intact. Normal sensation to the left face. CN VII facial movements symmetric  CN VIII not tested  CN IX, X no uvula  deviation, symmetric rise of soft palate  CN XI 5/5 SCM and trapezius strength bilaterally  CN XII Midline tongue protrusion, symmetric L/R movements   Psychiatric:        Mood and Affect: Mood normal.        Behavior: Behavior normal.     ED Results / Procedures / Treatments   Labs (all labs ordered are listed, but only abnormal results are displayed) Labs Reviewed  CBC - Abnormal; Notable for the following components:  Result Value   Hemoglobin 12.2 (*)    HCT 38.9 (*)    All other components within normal limits  COMPREHENSIVE METABOLIC PANEL - Abnormal; Notable for the following components:   Glucose, Bld 125 (*)    All other components within normal limits  I-STAT CHEM 8, ED - Abnormal; Notable for the following components:   Potassium 3.4 (*)    Glucose, Bld 126 (*)    All other components within normal limits  ETHANOL  PROTIME-INR  APTT  DIFFERENTIAL  RAPID URINE DRUG SCREEN, HOSP PERFORMED  URINALYSIS, ROUTINE W REFLEX MICROSCOPIC    EKG None  Radiology MR BRAIN WO CONTRAST  Result Date: 05/20/2022 CLINICAL DATA:  Neuro deficit, stroke suspected EXAM: MRI HEAD WITHOUT CONTRAST TECHNIQUE: Multiplanar, multiecho pulse sequences of the brain and surrounding structures were obtained without intravenous contrast. COMPARISON:  04/22/2022 FINDINGS: Brain: No evidence of acute or subacute infarct.Increased signal on diffusion-weighted imaging in the left parietooccipital region and left splenium of the corpus callosum, which are without definite ADC correlates and likely reflect T2 shine through. No acute hemorrhage, mass, mass effect, or midline shift. No hydrocephalus or extra-axial collection. Normal pituitary and craniocervical junction. Curvilinear susceptibility is associated with the left parietooccipital infarct, likely petechial hemorrhage. Additional focus of hemosiderin deposition left lentiform nucleus (series 4, image 57), which correlates with a remote lacunar  infarct. Additional cortical encephalomalacia and lacunar infarcts in the posterior left frontal lobe (series 3, image 35. Vascular: Normal arterial flow voids. Skull and upper cervical spine: Normal marrow signal. Sinuses/Orbits: Mucous retention cyst in the left maxillary sinus. Mild mucosal thickening in the ethmoid air cells. No acute finding in the orbits. Other: The mastoid air cells are well aerated. IMPRESSION: No acute intracranial process. No evidence of acute or subacute infarct. Electronically Signed   By: Wiliam Ke M.D.   On: 05/20/2022 17:13   CT HEAD WO CONTRAST  Result Date: 05/20/2022 CLINICAL DATA:  Stroke suspected, numbness/tingling of right face and right EXAM: CT HEAD WITHOUT CONTRAST TECHNIQUE: Contiguous axial images were obtained from the base of the skull through the vertex without intravenous contrast. RADIATION DOSE REDUCTION: This exam was performed according to the departmental dose-optimization program which includes automated exposure control, adjustment of the mA and/or kV according to patient size and/or use of iterative reconstruction technique. COMPARISON:  04/22/2022 FINDINGS: Brain: Possible hypodensity in the right anterior temporal lobe (series 3, image 11), although this area is prone to artifact. Hypodensity in the left PCA territory, consistent with expected evolution of the infarcts noted on the 04/22/2022 exam. The previously noted left MCA territory infarcts are less conspicuous and only caused minimal encephalomalacia (series 3, images 21 and 22). No evidence of acute hemorrhage, mass, mass effect, or midline shift. No hydrocephalus or extra-axial fluid collection. Vascular: No hyperdense vessel. Atherosclerotic calcifications in the intracranial carotid and vertebral arteries. Skull: Negative for fracture or focal lesion. Sinuses/Orbits: Small mucous retention cyst in the left maxillary sinus. No acute finding in the orbits. Other: The mastoid air cells are  well aerated. IMPRESSION: 1. Possible hypodensity in the right anterior temporal lobe. Although this area is prone to artifact, this could an acute infarct. MRI is recommended for further evaluation. 2. Evolution of previously noted left MCA and PCA territory infarcts. 3. No acute intracranial hemorrhage. These results were called by telephone at the time of interpretation on 05/20/2022 at 1:52 pm to provider COOPER ROBBINS , who verbally acknowledged these results. Electronically Signed   By:  Wiliam Ke M.D.   On: 05/20/2022 13:53    Procedures Procedures    Medications Ordered in ED Medications - No data to display  ED Course/ Medical Decision Making/ A&P                             Medical Decision Making  This patient is a 37 y.o. male who presents to the ED for concern of right facial numbness, this involves an extensive number of treatment options, and is a complaint that carries with it a high risk of complications and morbidity. The emergent differential diagnosis prior to evaluation includes, but is not limited to,  CVA, neoplasm, neuropathy, neuromuscular disorder . This is not an exhaustive differential.   Past Medical History / Co-morbidities / Social History: history of CVA 1 month ago, hypertension, hyperlipemia, afib, T2DM, HFrEF  Additional history: Chart reviewed. Pertinent results include: recent admission and work-up for CVA 1 month ago  Physical Exam: Physical exam performed. The pertinent findings include: subjective right medial facial numbness.  Otherwise alert and oriented and neurologically intact without focal deficits.  Lab Tests: I ordered, and personally interpreted labs.  The pertinent results include:  K 3.4, no other acute laboratory findings    Imaging Studies: I ordered imaging studies including CT head, MRI brain. I independently visualized and interpreted imaging which showed   CT:   1. Possible hypodensity in the right anterior temporal  lobe. Although this area is prone to artifact, this could an acute infarct. MRI is recommended for further evaluation. 2. Evolution of previously noted left MCA and PCA territory infarcts. 3. No acute intracranial hemorrhage.  MRI:  No acute intracranial process. No evidence of acute or subacute infarct.  I agree with the radiologist interpretation.   Cardiac Monitoring:  The patient was maintained on a cardiac monitor. Cardiac monitor showed an underlying rhythm of: sinus rhythm, no STEMI. I agree with this interpretation.  Consultations Obtained: I requested consultation with the neurology on call Dr. Iver Nestle,  and discussed lab and imaging findings as well as pertinent plan - they recommend: d/c with close outpatient neurology follow-up.  Emphasized importance of compliance with medications.  No indication for admission for TIA workup.   Disposition: After consideration of the diagnostic results and the patients response to treatment, I feel that emergency department workup does not suggest an emergent condition requiring admission or immediate intervention beyond what has been performed at this time. The plan is: Discharge with close outpatient neurology follow-up per neurology recommendations.  No indication for admission for TIA workup give recent comprehensive work-up. Evaluation and diagnostic testing in the emergency department does not suggest an emergent condition requiring admission or immediate intervention beyond what has been performed at this time.  Plan for discharge with close PCP follow-up.  Patient is understanding and amenable with plan, educated on red flag symptoms that would prompt immediate return.  Patient discharged in stable condition.   Final Clinical Impression(s) / ED Diagnoses Final diagnoses:  Right facial numbness    Rx / DC Orders ED Discharge Orders     None     An After Visit Summary was printed and given to the patient.     Silva Bandy,  PA-C 05/20/22 1812    Sloan Leiter, DO 05/21/22 [phone]

## 2022-05-20 NOTE — ED Triage Notes (Signed)
Pt came in via POV d/t noticing some Rt sided facial numbness/tingling & in his Rt calf as well. Did have a stroke last month with residual stuttering & Rt hand numbness. He last felt normal at his baseline at 11pm last night. A/Ox4, denies pain while in triage.

## 2022-05-21 ENCOUNTER — Other Ambulatory Visit (HOSPITAL_COMMUNITY): Payer: Self-pay

## 2022-05-21 ENCOUNTER — Telehealth: Payer: Self-pay | Admitting: Internal Medicine

## 2022-05-21 NOTE — Telephone Encounter (Signed)
Forms were re-faxed with new return to work date. (06/28/22)

## 2022-05-21 NOTE — Telephone Encounter (Signed)
Patient called and asked if he could have an extension on his work note for his second job due to his previous stroke. He would like a call back at [phone].

## 2022-05-24 ENCOUNTER — Encounter: Payer: Self-pay | Admitting: Internal Medicine

## 2022-05-24 NOTE — Telephone Encounter (Signed)
Pt states his job will not accept the letter because it was not signed. Please have Dr [name] sign the document and fax it again.

## 2022-05-24 NOTE — Telephone Encounter (Signed)
What letter ?

## 2022-05-25 ENCOUNTER — Encounter: Payer: Self-pay | Admitting: Internal Medicine

## 2022-05-25 NOTE — Telephone Encounter (Signed)
PW faxed again.

## 2022-05-25 NOTE — Telephone Encounter (Signed)
Error

## 2022-05-26 ENCOUNTER — Ambulatory Visit (HOSPITAL_COMMUNITY): Payer: BC Managed Care – PPO | Attending: Cardiology

## 2022-05-26 ENCOUNTER — Other Ambulatory Visit (HOSPITAL_COMMUNITY): Payer: BC Managed Care – PPO

## 2022-05-26 DIAGNOSIS — R9431 Abnormal electrocardiogram [ECG] [EKG]: Secondary | ICD-10-CM | POA: Diagnosis not present

## 2022-05-26 LAB — ECHOCARDIOGRAM COMPLETE
Area-P 1/2: 2.77 cm2
S' Lateral: 3.8 cm

## 2022-06-07 NOTE — Therapy (Signed)
OUTPATIENT OCCUPATIONAL THERAPY NEURO TREATMENT NOTE  Patient Name: Jesse Duncan MRN: [id number] DOB:[birth year], 37 y.o., male Today's Date: 06/09/2022  PCP: [name], MD REFERRING PROVIDER: [name], MD  END OF SESSION:  OT End of Session - 06/09/22 0937     Visit Number 2    Number of Visits 13    Date for OT Re-Evaluation 07/02/22    Authorization Type [employer] PPO    OT Start Time [phone]    OT Stop Time 1018    OT Time Calculation (min) 41 min    Activity Tolerance Patient tolerated treatment well;No increased pain;Patient limited by fatigue    Behavior During Therapy Flat affect;WFL for tasks assessed/performed             Past Medical History:  Diagnosis Date   Allergic rhinitis due to pollen 07/06/2010   Atrial fibrillation (HCC) 08/15/2015   Diabetes mellitus without complication (HCC)    Dyslipidemia    Hyperlipidemia 05/25/2010   Hypertension    Impaired fasting blood sugar 08/15/2015   Obesity    OSA (obstructive sleep apnea) 10/20/2015   Prediabetes    Sleep apnea    Smoker    Tobacco use disorder 11/14/2013   Past Surgical History:  Procedure Laterality Date   BREAST SURGERY     Patient Active Problem List   Diagnosis Date Noted   Need for vaccination 05/03/2022   Class 2 severe obesity due to excess calories with serious comorbidity and body mass index (BMI) of 35.0 to 35.9 in adult (HCC) 05/03/2022   Deficiency anemia 05/03/2022   Stroke (HCC) 04/22/2022   Abnormal electrocardiogram (ECG) (EKG) 04/08/2022   Chronic combined systolic and diastolic heart failure (HCC) 09/28/2021   Tobacco use 09/28/2021   Insulin-requiring or dependent type II diabetes mellitus (HCC) 06/02/2021   Primary hypertension 06/02/2021   Hyperlipidemia associated with type 2 diabetes mellitus (HCC) 12/04/2018   OSA (obstructive sleep apnea) 10/20/2015   Atrial fibrillation (HCC) 08/15/2015   Hypogonadism male 07/12/2013   Allergic rhinitis due to pollen  07/06/2010    ONSET DATE: 04/22/22  REFERRING DIAG: J19.147 (ICD-10-CM) - Cerebrovascular accident (CVA) due to bilateral thrombosis of middle cerebral arteries  THERAPY DIAG:  Other lack of coordination  Other disturbances of skin sensation  Hemiplegia and hemiparesis following cerebral infarction affecting right dominant side (HCC)  Muscle weakness (generalized)  Rationale for Evaluation and Treatment: Rehabilitation  PERTINENT HISTORY: 37 y/o M admitted to [facility] on 4/18 for R arm weakness/numbness and speech problem. MRI shows early subacute large and medium sized vessel time infarct in both left MCA and left PCA. Cytotoxic edema with minor petechial hemorrhage in left occipital lobe. PMHx: PAF, HTN, chronic combined HFrEF and HFpEF, HLD, IDDM, cigarette smoking. Pt reports when he had the stroke he started stumbling over his words while he was at work.  Pt then reports feeling his face and arm going numb.  MD set him up for MRI where they noted infarcts.  Pt reports speech is still difficult, "knowing what to say but can't get it out" and stuttering comes and goes as he fatigues.  Pt reports arm still somewhat numb but when up and moving that he can maintain grasp but other times he still has difficulty with grasp and manipulation with pen for handwriting.  Pt reports still having difficulty with peripheral vision on R side.    PRECAUTIONS: None; WEIGHT BEARING RESTRICTIONS: No   SUBJECTIVE:   SUBJECTIVE STATEMENT: He did go  to ED for increased face/arm tingling on 05/20/22 which showed possible acute infarct and evolution of previous CVA, but was not admitted and told to f/u closely with OP neurology.  He states no incident since then. Neurology appt is in July (8th). BP check is 155/183mmHg today.   PAIN:  Are you having pain? No, tingling in R hand  FALLS: Has patient fallen in last 6 months? No  LIVING ENVIRONMENT: Lives with: lives with their family (spouse and 3  children) Lives in: House/apartment Stairs: Yes: Internal: 17 steps; on right going up Has following equipment at home:  hand held shower head  PLOF: Independent, Independent with basic ADLs, and Vocation/Vocational requirements: works at [employer], works 2 jobs  Merchandiser, retail for [employer].  Pt reports that he felt like he was outgoing and had a sense of humor and now feels that is gone.    PATIENT GOALS: to get my life back, function every day, getting the hand together   OBJECTIVE: (All objective assessments below are from initial evaluation on: 05/17/22 unless otherwise specified.)   HAND DOMINANCE: Right  ADLs: Overall ADLs: Reports initial difficulty with clothing fasteners, however all Mod I - Independent now Transfers/ambulation related to ADLs: Independent  IADLs: Wife would complete all IADLs, pt did drive prior to CVA but has not since Medication management: "take them every day now" Handwriting: Increased time Pt reports "its a struggle"   MOBILITY STATUS: Independent  POSTURE COMMENTS:  No Significant postural limitations  ACTIVITY TOLERANCE: Activity tolerance: RUE fatigued with box and blocks assessment  FUNCTIONAL OUTCOME MEASURES: FOTO: 55 on eval, predicted 75 at d/c  UPPER EXTREMITY ROM:  WFL bilaterally  UPPER EXTREMITY MMT:   WFL bilaterally  HAND FUNCTION: Grip strength: Right: 36 lbs; Left: 90 lbs, Lateral pinch: Right: 9 lbs, Left: 26 lbs, and 3 point pinch: Right: 7 lbs, Left: 21 lbs  COORDINATION: Finger Nose Finger test: very mild slowing on R compared to L 9 Hole Peg test: Right: 29.12 sec; Left: 22.38 sec Box and Blocks:  Right 47 blocks, Left 55 blocks  SENSATION: Light touch: WFL Stereognosis: WFL Pt does report inconsistent sensation, and tingling in R hand  COGNITION: Overall cognitive status: Within functional limits for tasks assessed Some aphasia and difficulty with awareness and insight.  VISION: Subjective report: noticing  intermittent R inattention, blurriness in R visual field Baseline vision: No visual deficits  VISION ASSESSMENT: Tracking/Visual pursuits: Decreased smoothness with horizontal tracking Visual Fields: Right visual field deficits  Patient has difficulty with following activities due to following visual impairments: Pt nearly ran into doorframe on R of body when ambulating from waiting room into treatment gym.  Pt with decreased spacing with line bisection 1/4 and with planning when completing clock drawing.  PERCEPTION: Impaired: Inattention/neglect: Impaired- to be further tested in functional concext and Spatial orientation: Pt with decreased spacing with line bisection 1/4 and with planning when completing clock drawing.  OBSERVATIONS: flat affect.  Pt reporting noticing change in mood and motivation.  TODAY'S TREATMENT:  06/09/22: OT reviews pencil tricks with him- he's doing well, next he does show mild wrist strength and continued tingling and hand weakness, so OT gives him the following HEP, including "waiter carry" nerve glides to give feedback through all nerves of the Rt arm. He is given green t-putty and hand strength activities in pinch and different positions. He struggles with these and arm does fatigue today with hammer and wrist strength.   Exercises - Wrist Extension with Resistance  - 2-4 x daily - 1-2 sets - 10-15 reps - Wrist Flexion with Resistance  - 2-4 x daily - 1-2 sets - 10-15 reps - Hammer Stretch or Strength   - 2-4 x daily - 1-2 sets - 10-15 reps - Full Fist  - 2-3 x daily - 5 reps - "Duck Mouth" Strength  - 2-3 x daily - 5 reps - Finger Extension "Pizza!"   - 2-3 x daily - 5 reps - Thumb Opposition with Putty  - 2-3 x daily - 5 reps - Cutting Putty  - 2-3 x daily - 5 reps Patient Education - [employer] Strategy    PATIENT EDUCATION: Education details: Educated on role and  purpose of OT as well as potential interventions and goals for therapy based on initial evaluation findings. Person educated: Patient Education method: Explanation, Demonstration, and Handouts Education comprehension: verbalized understanding and needs further education  HOME EXERCISE PROGRAM: Access Code: [email or web address] URL: [email or web address] Date: 06/09/2022 Prepared by: Fannie Knee  Additional Patient Education: - [facility] Strategy - In-Hand Manipulation Skills   GOALS: Goals reviewed with patient? No  SHORT TERM GOALS: Target date: 06/11/22  Pt will be independent with fine and gross motor control HEP. Baseline: Goal status: 06/09/22: Progressing  2.  Pt will be independent in hand strengthening HEP. Baseline:  Goal status: 06/09/22: progressing   3.  Pt will verbalize understanding of compensatory strategies for impaired visual attention. Baseline:  Goal status: 06/09/22: progressing   LONG TERM GOALS: Target date: 07/02/22  Pt will demonstrate improved fine motor coordination for ADLs as evidenced by decreasing 9 hole peg test score for RUE by 4 secs Baseline: Right: 29.12 sec; Left: 22.38 sec Goal status: INITIAL  2.  Pt will demonstrate improved UE functional use for ADLs as evidenced by increasing box/ blocks score by 5 blocks with RUE Baseline: Right 47 blocks, Left 55 blocks Goal status: INITIAL  3.  Pt will demonstrate improved grip strength by 5# to allow for increased ease and safety with opening refrigerator and food containers. Baseline: Right: 36 lbs; Left: 90 lbs Goal status: INITIAL  4.  Pt will verbalize understanding of return to driving recommendations. Baseline:  Goal status: INITIAL  5.  Pt will verbalize understanding of return to work recommendations and readiness. Baseline:  Goal status: INITIAL  6.  Pt will write and/or type 3-5 sentence paragraph in <5 mins with 90% accuracy. Baseline:  Goal status:  INITIAL  ASSESSMENT:  CLINICAL IMPRESSION: 06/09/22: Doing well, tolerating exericses, but does have some weakness and fatigue, continue on   Eval: Patient is a 37 y.o. male who was seen today for occupational therapy evaluation for impairments impacting ADLs/IADLs s/p CVA. Pt reports and demonstrates ongoing impairments with strength, sensation, coordination, and vision impacting ability to complete IADLs at PLOF.  Pt lives with spouse and 3 children in house with bedroom/bathroom on 2nd floor with 17 steps to 2nd floor.  Pt works 2 jobs in Scientist, water quality, Merchandiser, retail at [employer] - jobs requiring a lot of  typing and computer skills.  Pt will benefit from skilled occupational therapy services to address strength and coordination, altered sensation, balance, GM/FM control, cognition, safety awareness, introduction of compensatory strategies/AE prn, visual-perception, and implementation of an HEP to improve participation and safety during ADLs and IADLs.    PLAN:  OT FREQUENCY: 1-2x/week  OT DURATION: 6 weeks  PLANNED INTERVENTIONS: self care/ADL training, therapeutic exercise, therapeutic activity, neuromuscular re-education, passive range of motion, functional mobility training, ultrasound, moist heat, cryotherapy, patient/family education, visual/perceptual remediation/compensation, psychosocial skills training, energy conservation, coping strategies training, and DME and/or AE instructions  CONSULTED AND AGREED WITH PLAN OF CARE: Patient  PLAN FOR NEXT SESSION:  If needed discuss stroke association return to work recommendations/check list. Review HEP    Fannie Knee, OTR/L 06/09/2022, 12:59 PM

## 2022-06-09 ENCOUNTER — Ambulatory Visit
Payer: BC Managed Care – PPO | Attending: Internal Medicine | Admitting: Rehabilitative and Restorative Service Providers"

## 2022-06-09 ENCOUNTER — Encounter: Payer: Self-pay | Admitting: Rehabilitative and Restorative Service Providers"

## 2022-06-09 ENCOUNTER — Ambulatory Visit: Payer: BC Managed Care – PPO

## 2022-06-09 DIAGNOSIS — R41841 Cognitive communication deficit: Secondary | ICD-10-CM

## 2022-06-09 DIAGNOSIS — M6281 Muscle weakness (generalized): Secondary | ICD-10-CM | POA: Insufficient documentation

## 2022-06-09 DIAGNOSIS — R471 Dysarthria and anarthria: Secondary | ICD-10-CM

## 2022-06-09 DIAGNOSIS — R278 Other lack of coordination: Secondary | ICD-10-CM | POA: Insufficient documentation

## 2022-06-09 DIAGNOSIS — R208 Other disturbances of skin sensation: Secondary | ICD-10-CM | POA: Diagnosis not present

## 2022-06-09 DIAGNOSIS — I69351 Hemiplegia and hemiparesis following cerebral infarction affecting right dominant side: Secondary | ICD-10-CM | POA: Diagnosis not present

## 2022-06-09 NOTE — Therapy (Signed)
OUTPATIENT SPEECH LANGUAGE PATHOLOGY TREATMENT   Patient Name: Jesse Duncan MRN: [id number] DOB:[birth year], 37 y.o., male Today's Date: 06/09/2022  PCP: [name], MD REFERRING PROVIDER: [name], MD  END OF SESSION:  End of Session - 06/09/22 1020     Visit Number 2    Number of Visits 9    Date for SLP Re-Evaluation 07/12/22    SLP Start Time 1019    SLP Stop Time  1100    SLP Time Calculation (min) 41 min    Activity Tolerance Patient tolerated treatment well             Past Medical History:  Diagnosis Date   Allergic rhinitis due to pollen 07/06/2010   Atrial fibrillation (HCC) 08/15/2015   Diabetes mellitus without complication (HCC)    Dyslipidemia    Hyperlipidemia 05/25/2010   Hypertension    Impaired fasting blood sugar 08/15/2015   Obesity    OSA (obstructive sleep apnea) 10/20/2015   Prediabetes    Sleep apnea    Smoker    Tobacco use disorder 11/14/2013   Past Surgical History:  Procedure Laterality Date   BREAST SURGERY     Patient Active Problem List   Diagnosis Date Noted   Need for vaccination 05/03/2022   Class 2 severe obesity due to excess calories with serious comorbidity and body mass index (BMI) of 35.0 to 35.9 in adult (HCC) 05/03/2022   Deficiency anemia 05/03/2022   Stroke (HCC) 04/22/2022   Abnormal electrocardiogram (ECG) (EKG) 04/08/2022   Chronic combined systolic and diastolic heart failure (HCC) 09/28/2021   Tobacco use 09/28/2021   Insulin-requiring or dependent type II diabetes mellitus (HCC) 06/02/2021   Primary hypertension 06/02/2021   Hyperlipidemia associated with type 2 diabetes mellitus (HCC) 12/04/2018   OSA (obstructive sleep apnea) 10/20/2015   Atrial fibrillation (HCC) 08/15/2015   Hypogonadism male 07/12/2013   Allergic rhinitis due to pollen 07/06/2010    ONSET DATE: 04/22/2022   REFERRING DIAG: W09.811 (ICD-10-CM) - Cerebrovascular accident (CVA) due to bilateral thrombosis of middle  cerebral arteries (HCC)   THERAPY DIAG:  Cognitive communication deficit  Dysarthria and anarthria  Rationale for Evaluation and Treatment: Rehabilitation  SUBJECTIVE:   SUBJECTIVE STATEMENT: Reports ongoing improvement since evaluation on 05/26/22, especially with decr'd speech sx. Pt accompanied by: self  PERTINENT HISTORY: Per MD note "Patient is a 37 y.o.  male (at time of event) with history of HFrEF, PAF, HTN, HLD, DM-2, tobacco use who was referred to the ED by his PCP as outpatient MRI showed acute CVA.  Patient was having right upper extremity weakness, right facial weakness and dysarthria for the past several day."   PAIN:  Are you having pain? No  FALLS: Has patient fallen in last 6 months?  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 He endorses facial *mandibular/maxillary branch* numbness yesterday that has improved as well as waxing/waning speech difficulties. No focal CN deficits present - and pt's phonation as well as receptive language is intact. SLUMS *[employer] Mental Status exam completed with pt scoring 26/30 - indicative of mild cog deficits per  authors : 27-30 normal, 21-26 mild disorder, 1-20 severe. Strengths noted in areas of orientation, memory and visuospatial skills. Functional math question challenging for pt - correctly answered with visual cues- and pt reports this to be a new change. Expressive language deficits noted resulting in dysfluent output with novel communication due to word finding deficits. Pt is amenable to  cues including phonemic cues, sentence completion, phrase completion, etc. Repetition at complex multisyllabic area resulted in frequent breakdown for pt. Reviewed findings and recommendations with pt using teach back. Follow up SLP at next venue of care indicated to maximize pt's cognitive linguistic skills especially given hefty demands in his life. 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 Pt agreeable to plan. Will follow up while pt in -house for treatment and family education.    TODAY'S TREATMENT:                                                                                                                                         DATE:  06/09/22: Pt indicates his anomia, and cognitive skills are at baseline. He has been back to [facility] for over 2 weeks, and returns to [employer] the end of this month (07/03/22). SLP conferred with pt about his STGs and LTGs set at eval (05/17/22) and he told SLP that many are now not necessary due to his progress since evaluation. SLP has either "met" or "deferred" those goals.  05/17/22: Education provided on evaluation results and SLP's recommendations. Initiated training re: fluency strategies (calming breath, pacing). Pt verbalizes agreement with POC, all questions answered to satisfaction.    PATIENT EDUCATION: Education details: see treatment section Person educated: Patient Education method: Explanation, Demonstration, and Handouts Education comprehension: verbalized understanding, returned demonstration, and needs further education   GOALS: Goals reviewed with patient? Yes  SHORT TERM GOALS: Target date: 06/14/2022  Pt will complete cognitive assessment, if indicated based on clinical judgement Baseline: Goal status: DEFERRED as pt states skills are essentially at baseline  2.  Pt will demonstrate dysarthria/fluency strategies in moderately complex generative speech or conversational speech tasks with rare  min-A Baseline:  Goal status: MET/DEFERRED - pt's skills today were WNL  3.  Pt will verbalize appropriate cognitive strategy to support successful completion of x3 functional home or work based tasks Baseline:  Goal status: DEFERRED as pt states skills are essentially at baseline   LONG TERM GOALS: Target date: 07/12/2022  Pt will verbalize x3 cognitive compensations (attention, memory, executive function) he has implemented at work, resulting in successful performance of vocational duties Baseline:  Goal status: DEFERRED as pt states skills are essentially at baseline  2.  Pt will give 15 minute mock presentation on work related topic with successful use of dysarthria strategies and compensations PRN Baseline:  Goal status: IN PROGRESS  3.  Pt will report subjective  reduction in instances of "giving up" when communicating with spouse or friends over 1 week period Baseline: always when dysfluencies occur, per pt reprot Goal status: DEFERRED as pt states she does not do this anymore  4.  Pt will report improved cognitive function via PROM by dc  Baseline:  Goal status: DEFERRED as pt states skills are essentially at baseline  ASSESSMENT:  CLINICAL IMPRESSION: Patient is a 37 y.o. M who was seen today for motor speech treatment s/p stroke. Pt reports that his mild dysarthria is now only rare - mostly evident at work under stressful and busier situations, and skills that were hampered by occasional anomia, and mildly impaired attention, memory, and executive function have all now returned to baseline. Pt's speech today appears WNL. Pt agreed with SLP that he would benefit from another appointment in approx 4 weeks to verity cont'd and persistent progress.   OBJECTIVE IMPAIRMENTS: include attention, memory, executive functioning, expressive language, and dysarthria. These impairments are limiting patient from effectively communicating at home and in community and maximizing performance in  vocational duties . Factors affecting potential to achieve goals and functional outcome are  none evidenced . Patient will benefit from skilled SLP services to address above impairments and improve overall function.  REHAB POTENTIAL: Good  PLAN:  SLP FREQUENCY: 1x/week  SLP DURATION: 8 weeks  PLANNED INTERVENTIONS: Language facilitation, Cueing hierachy, Cognitive reorganization, Internal/external aids, Functional tasks, SLP instruction and feedback, Compensatory strategies, and Patient/family education  [facility], CCC-SLP 06/09/2022, 10:24 AM

## 2022-06-14 NOTE — Therapy (Signed)
OUTPATIENT OCCUPATIONAL THERAPY NEURO TREATMENT NOTE  Patient Name: Jesse Duncan MRN: [id number] DOB:March 09, 1985, 37 y.o., male Today's Date: 06/16/2022  PCP: [name], MD REFERRING PROVIDER: [name], MD  END OF SESSION:  OT End of Session - 06/16/22 1412     Visit Number 3    Number of Visits 13    Date for OT Re-Evaluation 07/02/22    Authorization Type BC[employer] PPO    OT Start Time 1413    OT Stop Time 1453    OT Time Calculation (min) 40 min    Activity Tolerance Patient tolerated treatment well;No increased pain;Patient limited by fatigue    Behavior During Therapy Flat affect;WFL for tasks assessed/performed              Past Medical History:  Diagnosis Date   Allergic rhinitis due to pollen 07/06/2010   Atrial fibrillation (HCC) 08/15/2015   Diabetes mellitus without complication (HCC)    Dyslipidemia    Hyperlipidemia 05/25/2010   Hypertension    Impaired fasting blood sugar 08/15/2015   Obesity    OSA (obstructive sleep apnea) 10/20/2015   Prediabetes    Sleep apnea    Smoker    Tobacco use disorder 11/14/2013   Past Surgical History:  Procedure Laterality Date   BREAST SURGERY     Patient Active Problem List   Diagnosis Date Noted   Need for vaccination 05/03/2022   Class 2 severe obesity due to excess calories with serious comorbidity and body mass index (BMI) of 35.0 to 35.9 in adult (HCC) 05/03/2022   Deficiency anemia 05/03/2022   Stroke (HCC) 04/22/2022   Abnormal electrocardiogram (ECG) (EKG) 04/08/2022   Chronic combined systolic and diastolic heart failure (HCC) 09/28/2021   Tobacco use 09/28/2021   Insulin-requiring or dependent type II diabetes mellitus (HCC) 06/02/2021   Primary hypertension 06/02/2021   Hyperlipidemia associated with type 2 diabetes mellitus (HCC) 12/04/2018   OSA (obstructive sleep apnea) 10/20/2015   Atrial fibrillation (HCC) 08/15/2015   Hypogonadism male 07/12/2013   Allergic rhinitis due to pollen  07/06/2010    ONSET DATE: 04/22/22  REFERRING DIAG: W09.811 (ICD-10-CM) - Cerebrovascular accident (CVA) due to bilateral thrombosis of middle cerebral arteries  THERAPY DIAG:  Other lack of coordination  Muscle weakness (generalized)  Hemiplegia and hemiparesis following cerebral infarction affecting right dominant side (HCC)  Other disturbances of skin sensation  Rationale for Evaluation and Treatment: Rehabilitation  PERTINENT HISTORY: 37 y/o M admitted to [facility] on 4/18 for R arm weakness/numbness and speech problem. MRI shows early subacute large and medium sized vessel time infarct in both left MCA and left PCA. Cytotoxic edema with minor petechial hemorrhage in left occipital lobe. PMHx: PAF, HTN, chronic combined HFrEF and HFpEF, HLD, IDDM, cigarette smoking. Pt reports when he had the stroke he started stumbling over his words while he was at work.  Pt then reports feeling his face and arm going numb.  MD set him up for MRI where they noted infarcts.  Pt reports speech is still difficult, "knowing what to say but can't get it out" and stuttering comes and goes as he fatigues.  Pt reports arm still somewhat numb but when up and moving that he can maintain grasp but other times he still has difficulty with grasp and manipulation with pen for handwriting.  Pt reports still having difficulty with peripheral vision on R side.    PRECAUTIONS: None; WEIGHT BEARING RESTRICTIONS: No   SUBJECTIVE:   SUBJECTIVE STATEMENT: He states  doing well, has been working with putty and his wrist/hand.   PAIN:  Are you having pain?   No, tingling in R hand  FALLS: Has patient fallen in last 6 months? No  LIVING ENVIRONMENT: Lives with: lives with their family (spouse and 3 children) Lives in: House/apartment Stairs: Yes: Internal: 17 steps; on right going up Has following equipment at home:  hand held shower head  PLOF: Independent, Independent with basic ADLs, and Vocation/Vocational  requirements: works at [employer], works 2 jobs  Merchandiser, retail for [employer].  Pt reports that he felt like he was outgoing and had a sense of humor and now feels that is gone.    PATIENT GOALS: to get my life back, function every day, getting the hand together   OBJECTIVE: (All objective assessments below are from initial evaluation on: 05/17/22 unless otherwise specified.)   HAND DOMINANCE: Right  ADLs: Overall ADLs: Reports initial difficulty with clothing fasteners, however all Mod I - Independent now Transfers/ambulation related to ADLs: Independent  IADLs: Wife would complete all IADLs, pt did drive prior to CVA but has not since Medication management: "take them every day now" Handwriting: Increased time Pt reports "its a struggle"   MOBILITY STATUS: Independent  POSTURE COMMENTS:  No Significant postural limitations  ACTIVITY TOLERANCE: Activity tolerance: RUE fatigued with box and blocks assessment  FUNCTIONAL OUTCOME MEASURES: FOTO: 55 on eval, predicted 75 at d/c  UPPER EXTREMITY ROM:  WFL bilaterally  UPPER EXTREMITY MMT:   WFL bilaterally  HAND FUNCTION: 06/16/22: Grip Rt: 22##  Eval: Grip strength: Right: 36 lbs; Left: 90 lbs, Lateral pinch: Right: 9 lbs, Left: 26 lbs, and 3 point pinch: Right: 7 lbs, Left: 21 lbs  COORDINATION: Finger Nose Finger test: very mild slowing on R compared to L 9 Hole Peg test: Right: 29.12 sec; Left: 22.38 sec Box and Blocks:  Right 47 blocks, Left 55 blocks  SENSATION: 06/16/22:  Rt hand median and ulnar nerves test ~73mm Static 2-point discrimination today  (4-24mm is "normal")   Light touch: WFL Stereognosis: WFL Pt does report inconsistent sensation, and tingling in R hand  COGNITION: Overall cognitive status: Within functional limits for tasks assessed Some aphasia and difficulty with awareness and insight.  VISION: Subjective report: noticing intermittent R inattention, blurriness in R visual field Baseline vision: No visual  deficits  VISION ASSESSMENT: Tracking/Visual pursuits: Decreased smoothness with horizontal tracking Visual Fields: Right visual field deficits  Patient has difficulty with following activities due to following visual impairments: Pt nearly ran into doorframe on R of body when ambulating from waiting room into treatment gym.  Pt with decreased spacing with line bisection 1/4 and with planning when completing clock drawing.  PERCEPTION: Impaired: Inattention/neglect: Impaired- to be further tested in functional concext and Spatial orientation: Pt with decreased spacing with line bisection 1/4 and with planning when completing clock drawing.  OBSERVATIONS: flat affect.  Pt reporting noticing change in mood and motivation.  TODAY'S TREATMENT:                                                                                          06/16/22: OT  does a grip and sensation check with him and unfortunately grip is weaker than before (possibly due to recent CVA evolution?).  Sensation measures decreased globally compared to Lt hand.  Ot reviews his HEP with him with nerve re-education nerve glides, educated on new stretches as bolded below for elbow ext stretch (fridge door), prayer and wrist flexion stretches. He can do his nerve glides bette rafter that.   He is then lead through PRE as listed below showing some gains in strength.   Arm does fatigue after farmer's carry.   Hammer with 5# today in pro/sup x15 Wrist flexion with 5# x 15 Wrist ext with 6# x15 T-putty (as below with green, 1 x each to review)  Farmer's carry 10# x 45ft x1  BP check at end still shows elevated somewhat:  157/38mmHg machine;  147/32mmHg manual. Recommended to try bicep/tricep training with t-bands as well.    Exercises - Wrist Extension with Resistance  - 2-4 x daily - 1-2 sets - 10-15 reps - Wrist Flexion with Resistance  - 2-4 x daily - 1-2 sets - 10-15 reps - Hammer Stretch or Strength   - 2-4 x daily - 1-2 sets -  10-15 reps - Full Fist  - 2-3 x daily - 5 reps - "Duck Mouth" Strength  - 2-3 x daily - 5 reps - Finger Extension "Pizza!"   - 2-3 x daily - 5 reps - Thumb Opposition with Putty  - 2-3 x daily - 5 reps - Cutting Putty  - 2-3 x daily - 5 reps - Fridge Door Stretch  - 4 x daily - 3-5 reps - 15 sec hold - Wrist Prayer Stretch  - 4 x daily - 3-5 reps - 15 sec hold - Wrist Flexion Stretch  - 4 x daily - 3-5 reps - 15 sec hold     06/09/22: OT reviews pencil tricks with him- he's doing well, next he does show mild wrist strength and continued tingling and hand weakness, so OT gives him the following HEP, including "waiter carry" nerve glides to give feedback through all nerves of the Rt arm. He is given green t-putty and hand strength activities in pinch and different positions. He struggles with these and arm does fatigue today with hammer and wrist strength.   Exercises - Wrist Extension with Resistance  - 2-4 x daily - 1-2 sets - 10-15 reps - Wrist Flexion with Resistance  - 2-4 x daily - 1-2 sets - 10-15 reps - Hammer Stretch or Strength   - 2-4 x daily - 1-2 sets - 10-15 reps - Full Fist  - 2-3 x daily - 5 reps - "Duck Mouth" Strength  - 2-3 x daily - 5 reps - Finger Extension "Pizza!"   - 2-3 x daily - 5 reps - Thumb Opposition with Putty  - 2-3 x daily - 5 reps - Cutting Putty  - 2-3 x daily - 5 reps Patient Education - Lighthouse Strategy    PATIENT EDUCATION: Education details: Educated on role and purpose of OT as well as potential interventions and goals for therapy based on initial evaluation findings. Person educated: Patient Education method: Explanation, Demonstration, and Handouts Education comprehension: verbalized understanding and needs further education  HOME EXERCISE PROGRAM: Access Code: [email or web address] URL: [email or web address] Date: 06/09/2022 Prepared by: Fannie Knee  Additional Patient Education: - Light[facility] Strategy - In-Hand Manipulation  Skills   GOALS: Goals reviewed with patient? No  SHORT TERM GOALS: Target date: 06/11/22  Pt will  be independent with fine and gross motor control HEP. Baseline: Goal status: 06/09/22: Progressing  2.  Pt will be independent in hand strengthening HEP. Baseline:  Goal status: 06/09/22: progressing   3.  Pt will verbalize understanding of compensatory strategies for impaired visual attention. Baseline:  Goal status: 06/09/22: progressing   LONG TERM GOALS: Target date: 07/02/22  Pt will demonstrate improved fine motor coordination for ADLs as evidenced by decreasing 9 hole peg test score for RUE by 4 secs Baseline: Right: 29.12 sec; Left: 22.38 sec Goal status: INITIAL  2.  Pt will demonstrate improved UE functional use for ADLs as evidenced by increasing box/ blocks score by 5 blocks with RUE Baseline: Right 47 blocks, Left 55 blocks Goal status: INITIAL  3.  Pt will demonstrate improved grip strength by 5# to allow for increased ease and safety with opening refrigerator and food containers. Baseline: Right: 36 lbs; Left: 90 lbs Goal status: INITIAL  4.  Pt will verbalize understanding of return to driving recommendations. Baseline:  Goal status: INITIAL  5.  Pt will verbalize understanding of return to work recommendations and readiness. Baseline:  Goal status: INITIAL  6.  Pt will write and/or type 3-5 sentence paragraph in <5 mins with 90% accuracy. Baseline:  Goal status: INITIAL  ASSESSMENT:  CLINICAL IMPRESSION: 06/16/22: He is progressing arm strength but grip stil weak and likely moreso after semi-recent ED trip.  Will continue monitor BP, and he should speak with his doctors if it remains this high while on medications.    PLAN:  OT FREQUENCY: 1-2x/week  OT DURATION: 6 weeks  PLANNED INTERVENTIONS: self care/ADL training, therapeutic exercise, therapeutic activity, neuromuscular re-education, passive range of motion, functional mobility training, ultrasound,  moist heat, cryotherapy, patient/family education, visual/perceptual remediation/compensation, psychosocial skills training, energy conservation, coping strategies training, and DME and/or AE instructions  CONSULTED AND AGREED WITH PLAN OF CARE: Patient  PLAN FOR NEXT SESSION:  Perform stretches, PRE, endurance training for Rt hand/arm.  Try bi/triceps and also FMS if needed.  Check BP    Fannie Knee, OTR/L 06/16/2022, 3:04 PM

## 2022-06-16 ENCOUNTER — Ambulatory Visit: Payer: BC Managed Care – PPO | Admitting: Rehabilitative and Restorative Service Providers"

## 2022-06-16 ENCOUNTER — Ambulatory Visit: Payer: BC Managed Care – PPO

## 2022-06-16 ENCOUNTER — Encounter: Payer: Self-pay | Admitting: Rehabilitative and Restorative Service Providers"

## 2022-06-16 DIAGNOSIS — R278 Other lack of coordination: Secondary | ICD-10-CM

## 2022-06-16 DIAGNOSIS — M6281 Muscle weakness (generalized): Secondary | ICD-10-CM

## 2022-06-16 DIAGNOSIS — R41841 Cognitive communication deficit: Secondary | ICD-10-CM | POA: Diagnosis not present

## 2022-06-16 DIAGNOSIS — R208 Other disturbances of skin sensation: Secondary | ICD-10-CM

## 2022-06-16 DIAGNOSIS — R471 Dysarthria and anarthria: Secondary | ICD-10-CM | POA: Diagnosis not present

## 2022-06-16 DIAGNOSIS — I69351 Hemiplegia and hemiparesis following cerebral infarction affecting right dominant side: Secondary | ICD-10-CM

## 2022-06-18 ENCOUNTER — Ambulatory Visit: Payer: Self-pay

## 2022-06-18 ENCOUNTER — Other Ambulatory Visit: Payer: Self-pay | Admitting: Internal Medicine

## 2022-06-18 ENCOUNTER — Telehealth: Payer: Self-pay | Admitting: Internal Medicine

## 2022-06-18 ENCOUNTER — Telehealth: Payer: Self-pay

## 2022-06-18 DIAGNOSIS — E119 Type 2 diabetes mellitus without complications: Secondary | ICD-10-CM

## 2022-06-18 MED ORDER — INSULIN ASPART (W/NIACINAMIDE) 100 UNIT/ML ~~LOC~~ SOPN
10.0000 [IU] | PEN_INJECTOR | Freq: Three times a day (TID) | SUBCUTANEOUS | 1 refills | Status: DC
Start: 2022-06-18 — End: 2022-09-22

## 2022-06-18 NOTE — Patient Instructions (Signed)
Visit Information  Thank you for taking time to visit with me today. Please don't hesitate to contact me if I can be of assistance to you.   Following are the goals we discussed today:  Follow up with your pharmacy and PCP regarding medications needs I have sent a referral to our clinical pharmacist re: assistance with medication needs Continue to take medications as prescribed Continue to attend provider visits as scheduled  Our next appointment is by telephone on 06/22/22 at 11:45 am  Please call the care guide team at [phone] if you need to cancel or reschedule your appointment.   If you are experiencing a Mental Health or Behavioral Health Crisis or need someone to talk to, please call the Suicide and Crisis Lifeline: 44  [name], RN, MSN, BSN, CCM [facility] Care Coordinator [phone]

## 2022-06-18 NOTE — Patient Outreach (Signed)
  Care Coordination   Care Coordination  Visit Note   06/18/2022 Name: Jesse Duncan MRN: [id number] DOB: 19-Jan-1985  Jesse Duncan is a 37 y.o. year old male who sees [name], MD for primary care.  Collaboration/Care Coordination: RNCM  contacted patient to follow up on medication needs. Per [name] he called PCP office regarding medications needs and was informed it will be Monday before refill is completed. [facility] and was informed that they have the Fiasp in stock and could fill if prescription sent to them. However, pharmacist noted a problem with insurance and instructed RNCM to contact [facility] [address] for further information/direction. RN[facility] [address]. Per pharmacy not clear why unable to fill -states an insurance issue. RNCM contacted PCP office and spoke with Dominique to update. Per Dondra Prader [facility], not [facility] on [address](noted in chart as requested by patient today), adding [facility] should have been able to fill if there were no insurance issues.  Goals Addressed             This Visit's Progress    Care Coordination Activities       Interventions Today    Flowsheet Row Most Recent Value  Chronic Disease   Chronic disease during today's visit Diabetes  General Interventions   General Interventions Discussed/Reviewed Communication with  [[name] updated]  Communication with PCP/Specialists, Pharmacists  Education Interventions   Provided Verbal Education On Blood Sugar Monitoring, Medication  [RNCM advised patient to take other medications as directed, health healthy, avoid concentrated sweets, continue to check blood sugars as recommended]  Pharmacy Interventions   Pharmacy Dicussed/Reviewed Referral to Pharmacist  [assistance with obtaining insulin Fiasp]            SDOH assessments and interventions completed:  No  Care Coordination  Interventions:  Yes, provided   Follow up plan:  06/22/22    Encounter Outcome:  Pt. Visit Completed   [name], RN, MSN, BSN, CCM [facility] Care Coordinator [phone]

## 2022-06-18 NOTE — Telephone Encounter (Signed)
Prescription Request  06/18/2022  LOV: 05/03/2022  What is the name of the medication or equipment?  insulin aspart (FIASP) 100 UNIT/ML FlexTouch Pen   Have you contacted your pharmacy to request a refill? Yes   Which pharmacy would you like this sent to?  [facility] on [address] in [place]    Patient notified that their request is being sent to the clinical staff for review and that they should receive a response within 2 business days.   Please advise at Mobile [phone] (mobile)

## 2022-06-18 NOTE — Patient Outreach (Signed)
  Care Coordination   Follow Up Visit Note   06/18/2022 Name: Joash Fank MRN: [id number] DOB: 1985-09-05  Kaled Ai is a 37 y.o. year old male who sees [name], MD for primary care. I spoke with  [name] by phone today.  What matters to the patients health and wellness today?  Patient reports went to ED for numbness on 05/20/22. Reports was instructed to contact neurology to try to get sooner appointment. States called but wait time was long and missed his call back. Also reports difficulty obtaining Fiasp. He states [facility] [address] [facility] not [facility] it.  Goals Addressed             This Visit's Progress    Care Coordination activities: continue to improve post hospitalization       Interventions Today    Flowsheet Row Most Recent Value  Chronic Disease   Chronic disease during today's visit Diabetes, Other  General Interventions   General Interventions Discussed/Reviewed General Interventions Reviewed, Doctor Visits  Doctor Visits Discussed/Reviewed Doctor Visits Reviewed  [advsided to contact neurology to request sooner appointment as recommended per ED after visit summary]  Education Interventions   Education Provided Provided Education  Provided Verbal Education On Blood Sugar Monitoring, Medication  Nutrition Interventions   Nutrition Discussed/Reviewed Nutrition Reviewed  Pharmacy Interventions   Pharmacy Dicussed/Reviewed Pharmacy Topics Reviewed  [advised to contact pharmacy to follow up on insulin fiasp refill,  inquire if Candie Mile can be obtained at another [facility] if needed,  notify PCP if unable to get medication. Encouraged to Call [facility] to update once he has made calls.]            SDOH assessments and interventions completed:  No  Care Coordination Interventions:  Yes, provided   Follow up plan:  continue to follow    Encounter Outcome:  Pt. Visit Completed   [name], RN, MSN, BSN, CCM [facility] Coordinator [phone]

## 2022-06-21 ENCOUNTER — Telehealth: Payer: Self-pay

## 2022-06-21 ENCOUNTER — Ambulatory Visit: Payer: BC Managed Care – PPO | Attending: Internal Medicine

## 2022-06-21 DIAGNOSIS — I639 Cerebral infarction, unspecified: Secondary | ICD-10-CM

## 2022-06-21 DIAGNOSIS — I4891 Unspecified atrial fibrillation: Secondary | ICD-10-CM

## 2022-06-21 NOTE — Progress Notes (Unsigned)
   06/21/2022  Patient ID: Jesse Duncan, male   DOB: 18-Aug-1985, 37 y.o.   MRN: [id number]  Patient outreach to assist with access/affordability of Fiasp.  [facility] [address] [facility] not [facility] Fiasp in stock, but the location on [address] does.   Contacted pharmacy to check on status of prescription, and it is rejecting on insurance for "exceeding plan limits."  Upon contacting the insurance, they will only pay for 30 or 90 day supplies; and the pharmacy is billing for a 50 day supply.  [facility]ed [facility] to request they adjust the claim and resubmit, and the claim is going through when billed for 30 day supply.  [address] location does not have prescription in stock, as stated by the patient.  Contacted [place] [facility], and they are going to transfer the prescription and fill for the patient.  Lenna Gilford, PharmD, DPLA

## 2022-06-22 ENCOUNTER — Ambulatory Visit: Payer: Self-pay

## 2022-06-22 NOTE — Patient Outreach (Signed)
  Care Coordination   Follow Up Visit Note   06/22/2022 Name: Jesse Duncan MRN: [id number] DOB: 1985/10/11  Jesse Duncan is a 37 y.o. year old male who sees Jesse Grandchild, MD for primary care. I spoke with  Jesse Duncan by phone today.  What matters to the patients health and wellness today?  Jesse Duncan reports he has spoken with the clinical pharmacist and is awaiting a call from the pharmacy when medications are ready. He also reports he has called neurology office and was placed on cancellation list.  He does however have an upcoming appointment for 07/12/22. He is without questions at this time.  Goals Addressed             This Visit's Progress    COMPLETED: Care Coordination Activities       Interventions Today    Flowsheet Row Most Recent Value  Chronic Disease   Chronic disease during today's visit Diabetes, Other  [stroke]  General Interventions   General Interventions Discussed/Reviewed General Interventions Reviewed  Doctor Visits Discussed/Reviewed Specialist  [confirmed patient has contacted neurology and placed on the cancelation list. however patient is currently scheduled for office visit on 07/12/22.]  PCP/Specialist Visits Compliance with follow-up visit  Pharmacy Interventions   Pharmacy Dicussed/Reviewed Medication Adherence  [confirmed clinical pharmacist is assisting patient with Candie Mile needs. RNCM instructed patient to contact pharmacy on [facility] to see if prescription is ready for pick up and notify RNCM if he continues to have challenges with obtaing meds.]  Medication Adherence Unable to refill medication           Care Coordination activities: continue to improve post hospitalization       Interventions Today    Flowsheet Row Most Recent Value  Chronic Disease   Chronic disease during today's visit Diabetes, Other  [stroke]  General Interventions   General Interventions Discussed/Reviewed General Interventions Reviewed  Doctor  Visits Discussed/Reviewed Specialist  [confirmed patient has contacted neurology and placed on the cancelation list. however patient is currently scheduled for office visit on 07/12/22.]  PCP/Specialist Visits Compliance with follow-up visit  Pharmacy Interventions   Pharmacy Dicussed/Reviewed Medication Adherence  [confirmed clinical pharmacist is assisting patient with Candie Mile needs. RNCM instructed patient to contact pharmacy on [facility] to see if prescription is ready for pick up and notify RNCM if he continues to have challenges with obtaing meds.]  Medication Adherence Unable to refill medication            SDOH assessments and interventions completed:  No recently completed. No changes.  Care Coordination Interventions:  Yes, provided   Follow up plan: Follow up call scheduled for 07/13/22    Encounter Outcome:  Pt. Visit Completed   [name], RN, MSN, BSN, CCM [facility] Care Coordinator [phone]

## 2022-06-22 NOTE — Progress Notes (Signed)
   06/22/2022  Patient ID: Jesse Duncan, male   DOB: 01-May-1985, 37 y.o.   MRN: [id number]

## 2022-06-22 NOTE — Patient Instructions (Signed)
Visit Information  Thank you for taking time to visit with me today. Please don't hesitate to contact me if I can be of assistance to you.   Following are the goals we discussed today:  Call your Pharmacy to confirm if your medication is ready for pick up Contact RN Care coordinator [phone]), if you are not able to pick up your medication today. Continue to check blood sugar. Notify provider if outside recommended range.   Our next appointment is by telephone on 07/13/22 at 11:30 am  Please call the care guide team at [phone] if you need to cancel or reschedule your appointment.   If you are experiencing a Mental Health or Behavioral Health Crisis or need someone to talk to, please call the Suicide and Crisis Lifeline: 70  [name], RN, MSN, BSN, CCM [facility] Care Coordinator [phone]

## 2022-06-23 ENCOUNTER — Ambulatory Visit: Payer: BC Managed Care – PPO | Admitting: Physical Therapy

## 2022-06-23 ENCOUNTER — Other Ambulatory Visit (HOSPITAL_BASED_OUTPATIENT_CLINIC_OR_DEPARTMENT_OTHER): Payer: BC Managed Care – PPO

## 2022-06-23 ENCOUNTER — Ambulatory Visit: Payer: BC Managed Care – PPO | Admitting: Rehabilitative and Restorative Service Providers"

## 2022-06-26 ENCOUNTER — Encounter (HOSPITAL_COMMUNITY): Payer: Self-pay

## 2022-06-26 ENCOUNTER — Emergency Department (HOSPITAL_COMMUNITY): Payer: BC Managed Care – PPO

## 2022-06-26 ENCOUNTER — Other Ambulatory Visit: Payer: Self-pay

## 2022-06-26 ENCOUNTER — Emergency Department
Admission: EM | Admit: 2022-06-26 | Discharge: 2022-06-26 | Disposition: A | Discharge status: Home / Self Care | Payer: BC Managed Care – PPO | Attending: Medical | Admitting: Medical

## 2022-06-26 DIAGNOSIS — M79672 Pain in left foot: Secondary | ICD-10-CM

## 2022-06-26 DIAGNOSIS — S9032XA Contusion of left foot, initial encounter: Secondary | ICD-10-CM | POA: Insufficient documentation

## 2022-06-26 DIAGNOSIS — W208XXA Other cause of strike by thrown, projected or falling object, initial encounter: Secondary | ICD-10-CM | POA: Insufficient documentation

## 2022-06-26 DIAGNOSIS — S99922A Unspecified injury of left foot, initial encounter: Secondary | ICD-10-CM

## 2022-06-26 NOTE — Discharge Instructions (Addendum)
Use crutches as needed for weight-bearing  Apply ice every hour for 15 minutes while awake  Alternate Tylenol and ibuprofen as needed for any pain  Call ortho at the number above if there is no improvement in 3-5 days  Return to emergency department with any new or worsening symptoms

## 2022-06-26 NOTE — ED Provider Notes (Signed)
[facility]       Attending ED APP:  Gerhard Munch. Elmon Kirsch[place], [place]  Name: [name]  Age and Gender: 37 y.o. male  PCP: Peter Minium, MD    History of Present Illness      Chief Complaint   Patient presents with    Foot Injury       [name], date of birth 1985/06/29, is a 37 y.o. male who arrived by Car    HPI:  Patient was helping a friend move it would burner this morning when a piece of wood he was stepping on broke and caused wood burner to fall onto his left foot.  Patient complaining of pain that increases with palpation weight-bearing.  He states he took ibuprofen throughout the day in it did not not provide any relief from his discomfort.  He states that he took a shower and it seemed to make pain worse.  He denies prior injury to the left foot.    Review of Systems   Constitutional:  Positive for activity change. Negative for chills and fever.   Musculoskeletal:  Positive for gait problem. Negative for back pain.   Neurological:  Negative for weakness and numbness.   Hematological:  Does not bruise/bleed easily.   Psychiatric/Behavioral: Negative.     All other systems reviewed and are negative.         Historical Data    History Reviewed This Encounter: Medical History  Surgical History  Family History  Social History     Past Medical History:  Diagnosis  Past Medical History:   Diagnosis Date    Allergic rhinitis     Depression     Essential hypertension     Fracture of nasal bones     Headache     Nosebleed     Sinusitis     Viral hepatitis C        Past Surgical History:  Past Surgical History:   Procedure Laterality Date    Hx hand surgery      Hx orbital surgery Left 03/31/2020       Family History:   Family History   Problem Relation Age of Onset    Cancer Mother     Hypertension (High Blood Pressure) Father     Cancer Sister     Anesthesia Complications Neg Hx        Social History  Social History     Tobacco Use    Smoking status: Every Day     Current  packs/day: 0.00     Types: Cigarettes     Last attempt to quit: 03/31/2020     Years since quitting: 2.2     Passive exposure: Current    Smokeless tobacco: Never   Vaping Use    Vaping status: Never Used   Substance Use Topics    Alcohol use: Yes     Comment: denies    Drug use: Not Currently     Types: Cocaine       Social History     Substance and Sexual Activity   Drug Use Not Currently    Types: Cocaine       Allergies   Allergen Reactions    Penicillins        No outpatient medications have been marked as taking for the 06/26/22 encounter [facility] Encounter).           Physical Exam    ED Triage Vitals [06/26/22 0024]  BP (Non-Invasive) 132/85   Heart Rate (!) 105   Respiratory Rate 20   Temperature 36.9 C (98.4 F)   SpO2 97 %   Weight 86.2 kg (190 lb)   Height 1.753 m (5\' 9" )       Physical Exam  Vitals and nursing note reviewed.   Constitutional:       Appearance: Normal appearance. He is normal weight.   Cardiovascular:      Pulses: Normal pulses.           Dorsalis pedis pulses are 2+ on the left side.        Posterior tibial pulses are 2+ on the left side.   Musculoskeletal:         General: Swelling, tenderness and signs of injury present. No deformity.      Left foot: Decreased range of motion. No deformity, foot drop or prominent metatarsal heads.        Feet:    Feet:      Left foot:      Protective Sensation: 3 sites tested.  3 sites sensed.      Skin integrity: Erythema present.      Toenail Condition: Left toenails are normal.      Comments: Area tender palpation with erythema and light ecchymosis noted.  Negative gross or palpable deformity noted.  Skin:     General: Skin is warm and moist.      Capillary Refill: Capillary refill takes less than 2 seconds.      Findings: Bruising and erythema present.   Neurological:      Mental Status: He is alert.      Sensory: Sensation is intact.      Motor: Motor function is intact.      Comments: Active range of motion to the left foot and ankle but  flexion of the left ankle increasing pain in the metatarsal area.   Psychiatric:         Attention and Perception: Attention normal.         Mood and Affect: Mood normal.         Behavior: Behavior normal.            DDx:  Differential diagnosis includes, but is not limited to left foot fracture, left foot contusion call crush injury, sprain      Orders Placed This Encounter    XR FOOT LEFT         Patient Data    Labs and x-rays reviewed by myself  No results found for this or any previous visit (from the past 12 hour(s)).     XR FOOT LEFT   Final Result by Edi, Radresults In (06/22 0122)   No acute displaced fracture or dislocation.         Signed by Donavan Burnet, MD              EKG:  No results found for this visit on 06/26/22 (from the past 720 hour(s)).      ED Course:  Relevant prior external notes and tests were reviewed by myself if available.  During the patient's stay in the emergency department, the above listed imaging and/or labs were performed to assist with medical decision making and were reviewed by myself when available for review and attending consulted when appropriate.  Patient rechecked throughout remainder of emergency department course.   All questions/concerns addressed to the best of my ability.    ED Course as of 06/26/22 0143  Sat Jun 26, 2022   0135 IMPRESSION:  No acute displaced fracture or dislocation.        Signed by Donavan Burnet, MD           Medical Decision Making         Medical Decision Making  37 year old white male alert and oriented x3 no acute distress vital signs stable heart rate a little pass but he is in pain.  He states a wood burner fell on his left foot mom moving it with a friend.  He took ibuprofen over-the-counter and a hot shower and it did not relieve symptoms.  He did not hit her injury foot.  Increased pain with weight-bearing and palpation.  Physical exam neurologically intact no focal deficits noted.  X-ray was ordered in triage I do not see any acute  injury but we will await the official radial.  Radiology negative will discharge patient to home    Amount and/or Complexity of Data Reviewed  Radiology: ordered. Decision-making details documented in ED Course.    Risk  OTC drugs.  Risk Details: Alternate Tylenol and ibuprofen as needed for any pain or swelling  Apply ice every hour for 15 minutes while awake  Use the crutches as needed for weight-bearing  If no improvement 3-5 days call ortho at the number above  Return to emergency department            Clinical Impression:  Clinical Impression   Injury of left foot, initial encounter (Primary)   Contusion of left foot, initial encounter          Disposition:  Discharged       Peter Minium, MD  [address]  [place] [address]  [phone]      As needed    Rexanne Mano, DO  [address]  [place] [address]  [phone]    Call   If no improvement in 3-5 days      Current Discharge Medication List             //  Cala Bradford B. Athena Masse , 01:03     [facility], [facility] Emergency Department     The co-signing faculty was physically present in the emergency department and available for consultation and did not participate in the care of this patient.    This chart may have been completed after the conclusion of this patient's care due to the time constraints of simultaneous responsibilities of direct patient care activities during my clinical shift in the emergency department.     This note was partially generated using MModal Fluency Direct system, and there may be some incorrect words, spellings, and punctuation's that were not noted in checking the note before saving.    54}

## 2022-06-26 NOTE — ED Nurses Note (Signed)
Patient discharged home with family.  AVS reviewed with patient/care giver.  A written copy of the AVS and discharge instructions was given to the patient/care giver. Scripts handed to patient/care giver. Questions sufficiently answered as needed.  Patient/care giver encouraged to follow up with PCP as indicated.  In the event of an emergency, patient/care giver instructed to call 911 or go to the nearest emergency room.

## 2022-06-26 NOTE — ED Triage Notes (Signed)
Wood burn fell on left foot.

## 2022-06-28 ENCOUNTER — Telehealth: Payer: Self-pay | Admitting: Internal Medicine

## 2022-06-28 DIAGNOSIS — I10 Essential (primary) hypertension: Secondary | ICD-10-CM | POA: Diagnosis not present

## 2022-06-28 DIAGNOSIS — E119 Type 2 diabetes mellitus without complications: Secondary | ICD-10-CM | POA: Diagnosis not present

## 2022-06-28 DIAGNOSIS — Z6838 Body mass index (BMI) 38.0-38.9, adult: Secondary | ICD-10-CM | POA: Diagnosis not present

## 2022-06-28 DIAGNOSIS — Z794 Long term (current) use of insulin: Secondary | ICD-10-CM | POA: Diagnosis not present

## 2022-06-28 DIAGNOSIS — Z8673 Personal history of transient ischemic attack (TIA), and cerebral infarction without residual deficits: Secondary | ICD-10-CM | POA: Diagnosis not present

## 2022-06-28 NOTE — Telephone Encounter (Signed)
Patient would like his fmla extended for another month due to his stroke - He does not see the neurologist until next month.  Please advise.    Phone:  [phone]

## 2022-06-29 DIAGNOSIS — E119 Type 2 diabetes mellitus without complications: Secondary | ICD-10-CM | POA: Diagnosis not present

## 2022-06-29 DIAGNOSIS — Z8673 Personal history of transient ischemic attack (TIA), and cerebral infarction without residual deficits: Secondary | ICD-10-CM | POA: Diagnosis not present

## 2022-06-29 DIAGNOSIS — Z713 Dietary counseling and surveillance: Secondary | ICD-10-CM | POA: Diagnosis not present

## 2022-06-29 DIAGNOSIS — I10 Essential (primary) hypertension: Secondary | ICD-10-CM | POA: Diagnosis not present

## 2022-06-29 NOTE — Therapy (Signed)
OUTPATIENT OCCUPATIONAL THERAPY NEURO TREATMENT & PROGRESS NOTE  Patient Name: Jesse Duncan MRN: [id number] DOB:[birth year], 37 y.o., male Today's Date: 06/30/2022  PCP: [name], MD REFERRING PROVIDER: [name], MD  Progress Note  Reporting Period 05/17/22 to 06/30/22 .  See note below for Objective Data and Assessment of Progress/Goals.     END OF SESSION:  OT End of Session - 06/30/22 1315     Visit Number 4    Number of Visits 13    Date for OT Re-Evaluation 07/30/22    Authorization Type [employer] PPO    OT Start Time 1315    OT Stop Time 1405    OT Time Calculation (min) 50 min    Activity Tolerance Patient tolerated treatment well;No increased pain;Patient limited by fatigue    Behavior During Therapy [facility] for tasks assessed/performed              Past Medical History:  Diagnosis Date   Allergic rhinitis due to pollen 07/06/2010   Atrial fibrillation (HCC) 08/15/2015   Diabetes mellitus without complication (HCC)    Dyslipidemia    Hyperlipidemia 05/25/2010   Hypertension    Impaired fasting blood sugar 08/15/2015   Obesity    OSA (obstructive sleep apnea) 10/20/2015   Prediabetes    Sleep apnea    Smoker    Tobacco use disorder 11/14/2013   Past Surgical History:  Procedure Laterality Date   BREAST SURGERY     Patient Active Problem List   Diagnosis Date Noted   Need for vaccination 05/03/2022   Class 2 severe obesity due to excess calories with serious comorbidity and body mass index (BMI) of 35.0 to 35.9 in adult (HCC) 05/03/2022   Deficiency anemia 05/03/2022   Stroke (HCC) 04/22/2022   Abnormal electrocardiogram (ECG) (EKG) 04/08/2022   Chronic combined systolic and diastolic heart failure (HCC) 09/28/2021   Tobacco use 09/28/2021   Insulin-requiring or dependent type II diabetes mellitus (HCC) 06/02/2021   Primary hypertension 06/02/2021   Hyperlipidemia associated with type 2 diabetes mellitus (HCC) 12/04/2018   OSA (obstructive  sleep apnea) 10/20/2015   Atrial fibrillation (HCC) 08/15/2015   Hypogonadism male 07/12/2013   Allergic rhinitis due to pollen 07/06/2010    ONSET DATE: 04/22/22  REFERRING DIAG: D63.875 (ICD-10-CM) - Cerebrovascular accident (CVA) due to bilateral thrombosis of middle cerebral arteries  THERAPY DIAG:  Other lack of coordination  Muscle weakness (generalized)  Other disturbances of skin sensation  Rationale for Evaluation and Treatment: Rehabilitation  PERTINENT HISTORY: 37 y/o M admitted to [facility] on 4/18 for R arm weakness/numbness and speech problem. MRI shows early subacute large and medium sized vessel time infarct in both left MCA and left PCA. Cytotoxic edema with minor petechial hemorrhage in left occipital lobe. PMHx: PAF, HTN, chronic combined HFrEF and HFpEF, HLD, IDDM, cigarette smoking. Pt reports when he had the stroke he started stumbling over his words while he was at work.  Pt then reports feeling his face and arm going numb.  MD set him up for MRI where they noted infarcts.  Pt reports speech is still difficult, "knowing what to say but can't get it out" and stuttering comes and goes as he fatigues.  Pt reports arm still somewhat numb but when up and moving that he can maintain grasp but other times he still has difficulty with grasp and manipulation with pen for handwriting.  Pt reports still having difficulty with peripheral vision on R side.    PRECAUTIONS: None;  WEIGHT BEARING RESTRICTIONS: No   SUBJECTIVE:   SUBJECTIVE STATEMENT: He states "hit and miss" with sensation in Rt hand, still states some weakness. No significant pain.   PAIN:  Are you having pain?   No, tingling in R hand  FALLS: Has patient fallen in last 6 months? No  LIVING ENVIRONMENT: Lives with: lives with their family (spouse and 3 children) Lives in: House/apartment Stairs: Yes: Internal: 17 steps; on right going up Has following equipment at home:  hand held shower head  PLOF:  Independent, Independent with basic ADLs, and Vocation/Vocational requirements: works at [employer], works 2 jobs  Merchandiser, retail for [employer].  Pt reports that he felt like he was outgoing and had a sense of humor and now feels that is gone.    PATIENT GOALS: to get my life back, function every day, getting the hand together   OBJECTIVE: (All objective assessments below are from initial evaluation on: 05/17/22 unless otherwise specified.)   HAND DOMINANCE: Right  ADLs: Overall ADLs: 06/30/22: He states he can't drive or return to work yet due to heavy lifting required at [employer]. Home tasks are getting easier.    IADLs: Wife would complete all IADLs, pt did drive prior to CVA but has not since Medication management: "take them every day now" Handwriting: Increased time Pt reports "its a struggle"   FUNCTIONAL OUTCOME MEASURES: FOTO: 55 on eval, predicted 75 at d/c  UPPER EXTREMITY ROM:  WFL bilaterally  UPPER EXTREMITY MMT:   WFL bilaterally  HAND FUNCTION: 06/30/22: Grip Rt:  1-13#; 2-20#; 3- 24#; 4- 13#; 5- 14#    Lat pinch Rt: 9#,     3pt Pinch Rt: 5#  06/16/22: Grip Rt: 22#  Eval: Grip strength: Right: 36 lbs; Left: 90 lbs, Lateral pinch: Right: 9 lbs, Left: 26 lbs, and 3 point pinch: Right: 7 lbs, Left: 21 lbs  COORDINATION: 06/30/22: 9HPT: 25.6sec Box & Blocks: 50 blocks today    Finger Nose Finger test: very mild slowing on R compared to L 9 Hole Peg test: Right: 29.12 sec; Left: 22.38 sec Box and Blocks:  Right 47 blocks, Left 55 blocks  SENSATION: 06/30/22: Rt hand med nerve S2PD test:  4mm; Ulnar nerve: 4mm   06/16/22:  Rt hand median and ulnar nerves test ~29mm Static 2-point discrimination today  (4-27mm is "normal")   Light touch: WFL Stereognosis: WFL Pt does report inconsistent sensation, and tingling in R hand  COGNITION: Overall cognitive status: 06/30/22: WFL- no deficits noted in OT   VISION & PERCEPTION 06/30/22: no noted visual-spatial issues in OT sessions  recently, but Rt visual field is still decreased and he does state using lighthouse strategy, having decreased Rt awareness still    TODAY'S TREATMENT:                                                                                          06/30/22: He begins with BP check which is 156/115mmHg.  60BPM OT will send this to his PCP, as this remains high and he has not reached out to him yet, as asked.   Next, Pt performs AROM, gripping, and  strength with Rt arm/hand against therapist's resistance for exercise/activities as well as new measures today. (See areas of note above for details).  OT also discusses home and functional tasks with the pt and reviews goals. OT also reviews home exercises and strategies (inlcuding lighthouse and scanning) and provides review as below. Pt states understanding recommendations and desire to continue therapy as he feels he is still too weak to return to work and he is unconfident to return to driving (though he did drive to session today).   Exercises/Activities Reviewed:  - Fridge Door Stretch  - 4 x daily - 3-5 reps - 15 sec hold - Wrist Prayer Stretch  - 4 x daily - 3-5 reps - 15 sec hold - Wrist Flexion Stretch  - 4 x daily - 3-5 reps - 15 sec hold - Wrist Extension with Resistance  - 2-4 x daily - 1-2 sets - 10-15 reps - Wrist Flexion with Resistance  - 2-4 x daily - 1-2 sets - 10-15 reps - Hammer Stretch or Strength   - 2-4 x daily - 1-2 sets - 10-15 reps - Full Fist  - 2-3 x daily - 5 reps - "Duck Mouth" Strength  - 2-3 x daily - 5 reps - Finger Extension "Pizza!"   - 2-3 x daily - 5 reps - Thumb Opposition with Putty  - 2-3 x daily - 5 reps - Cutting Putty  - 2-3 x daily - 5 reps Patient Education - Lighthouse Strategy   PATIENT EDUCATION: Education details: Educated on role and purpose of OT as well as potential interventions and goals for therapy based on initial evaluation findings. Person educated: Patient Education method: Explanation,  Demonstration, and Handouts Education comprehension: verbalized understanding and needs further education  HOME EXERCISE PROGRAM: Access Code: [email or web address] URL: [email or web address] Date: 06/09/2022 Prepared by: Fannie Knee  Additional Patient Education: - Lighthouse Strategy - In-Hand Manipulation Skills   GOALS: Goals reviewed with patient? No  SHORT TERM GOALS: Target date: 06/11/22  Pt will be independent with fine and gross motor control HEP. Baseline: Goal status: 06/30/22: MET 06/09/22: Progressing  2.  Pt will be independent in hand strengthening HEP. Baseline:  Goal status: 06/30/22: MET 06/09/22: progressing   3.  Pt will verbalize understanding of compensatory strategies for impaired visual attention. Baseline:  Goal status: 06/30/22: MET 06/09/22: progressing   LONG TERM GOALS: Target date: 07/30/22  Pt will demonstrate improved fine motor coordination for ADLs as evidenced by decreasing 9 hole peg test score for RUE by 4 secs Baseline: Right: 29.12 sec; Left: 22.38 sec Goal status:06/30/22: MET 25sec   2.  Pt will demonstrate improved UE functional use for ADLs as evidenced by increasing box/ blocks score by 5 blocks with RUE Baseline: Right 47 blocks, Left 55 blocks Goal status: 06/30/22: Improved to 50 blocks now  3.  Pt will demonstrate improved grip strength by 5# to allow for increased ease and safety with opening refrigerator and food containers. Baseline: Right: 36 lbs; Left: 90 lbs Goal status: 06/30/22: Avg ~18-20# today worse now   4.  Pt will verbalize understanding of return to driving recommendations. Goal status: 06/30/22: MET (was referred to [facility])  5.  Pt will verbalize understanding of return to work recommendations and readiness. Goal status: 06/30/22: in process  6.  Pt will write and/or type 3-5 sentence paragraph in <5 mins with 90% accuracy. Goal status: 06/30/22: NT today, but he states he has not been practicing typing (not  good)  ASSESSMENT:  CLINICAL IMPRESSION: 06/30/22: He is doing well and better since start of care, increasing self-management strategies, coordination, sensation, but still complains of: Continued right sided spatial problems, continued paresthesia, grip is still weak and total arm strength/endurance is still decreased for him.  There is no good reason that his grip strength would be decreased today and may show a lack of effort due to 5 position grip testing not following the typical bell curve pattern.  He does voice a mild motivational deficit to return to all activities like work and typing in such and does admit to not practicing typing.  He also has only showed up for 4 total visits in a 6-week period of time which is far less than was initially prescribed.  We will request an additional moth of OT for strength and fnl endurance, to try to motivate him and prepare him for return to work   PLAN:  OT FREQUENCY: 1 x week  OT DURATION: 4 additional weeks from 06/30/22 - 07/30/22  PLANNED INTERVENTIONS: self care/ADL training, therapeutic exercise, therapeutic activity, neuromuscular re-education, passive range of motion, functional mobility training, ultrasound, moist heat, cryotherapy, patient/family education, visual/perceptual remediation/compensation, psychosocial skills training, energy conservation, coping strategies training, and DME and/or AE instructions  CONSULTED AND AGREED WITH PLAN OF CARE: Patient  PLAN FOR NEXT SESSION:  Focus mainly on: Warm up and stretches where tight, total arm strengthening (especially grip training), arm coordination trying to meet box and blocks goal, return to work tasks like lifting and moving heavy objects.  Keep monitoring blood pressure and make sure he has spoken to his doctor about this   Fannie Knee, OTR/L 06/30/2022, 5:24 PM

## 2022-06-29 NOTE — Telephone Encounter (Signed)
Patient called back to check on the status of his request. Best callback is [phone].

## 2022-06-29 NOTE — Telephone Encounter (Signed)
Contacted pt.  Inquired in additional paperwork would be faxed.   He stated his employer stated that PCP office could addend the forms already completed to reflect the extension dated on 08/02/22.  I have printed copy from media tab, updated return to work dated and faxed it back.

## 2022-06-30 ENCOUNTER — Ambulatory Visit: Payer: BC Managed Care – PPO | Admitting: Rehabilitative and Restorative Service Providers"

## 2022-06-30 ENCOUNTER — Encounter: Payer: BC Managed Care – PPO | Admitting: Speech Pathology

## 2022-06-30 ENCOUNTER — Encounter: Payer: Self-pay | Admitting: Rehabilitative and Restorative Service Providers"

## 2022-06-30 ENCOUNTER — Ambulatory Visit: Payer: BC Managed Care – PPO | Admitting: Physical Therapy

## 2022-06-30 DIAGNOSIS — M6281 Muscle weakness (generalized): Secondary | ICD-10-CM

## 2022-06-30 DIAGNOSIS — R208 Other disturbances of skin sensation: Secondary | ICD-10-CM

## 2022-06-30 DIAGNOSIS — R278 Other lack of coordination: Secondary | ICD-10-CM

## 2022-06-30 DIAGNOSIS — R41841 Cognitive communication deficit: Secondary | ICD-10-CM | POA: Diagnosis not present

## 2022-06-30 DIAGNOSIS — R471 Dysarthria and anarthria: Secondary | ICD-10-CM | POA: Diagnosis not present

## 2022-06-30 DIAGNOSIS — I69351 Hemiplegia and hemiparesis following cerebral infarction affecting right dominant side: Secondary | ICD-10-CM | POA: Diagnosis not present

## 2022-06-30 NOTE — Telephone Encounter (Signed)
New paperwork received and placed in provider's box up front.

## 2022-07-01 DIAGNOSIS — Z0279 Encounter for issue of other medical certificate: Secondary | ICD-10-CM

## 2022-07-01 NOTE — Telephone Encounter (Signed)
Forms have been completed and given to PCP to review and sign.  ?

## 2022-07-01 NOTE — Patient Instructions (Addendum)
Below is our plan:  Stroke:  acute/subacute infarcts in left MCA and left PCA, likely large vessel disease from left P2 occlusion and severe left M2 stenosis from uncontrolled risk factors  : Residual deficit: intermittent tingling and numbness of right side, right lower field vision loss. Continue aspirin 325 mg daily and clopidogrel 75 mg daily until 07/22/2022 then discontinue aspirin 325mg  and Plavix 75mg  and start aspirin 81mg  only. Continue  rosuvastatin 40mg  daily for secondary stroke prevention.  Discussed secondary stroke prevention measures and importance of close PCP follow up for aggressive stroke risk factor management. I have gone over the pathophysiology of stroke, warning signs and symptoms, risk factors and their management in some detail with instructions to go to the closest emergency room for symptoms of concern. HTN: BP goal <130/90. Elevated on Cardizem 300mg , Coreg 12.5mg  BID, hydralazine 25mg  TID and valsartan/hydrochlorothiazide 300-25mg  daily. Please monitor closely. Continue meds and follow up per PCP and cardiology.  HLD: LDL goal <70. Recent LDL 189. Continue rosuvastatin 40mg  daily per PCP.  DMII: A1c goal<7.0. Recent A1c 14.3. Uncontrolled. Continue insulin, metformin and Rybelsus per Dr [name]. Please follow up closely with PCP.  Right lower field vision loss: seek ophthalmology evaluation to rule out other possible causes.  Past episode of afib: avoid caffeine. 30 day monitor. Continue follow up with cardiology.  Combined chronic systolic/diastolic HF: continue coreg and hydralizine per cardiology.  Tobacco use: continue cessation.  OSA: please resume use of CPAP Obesity: work on healthy lifestyle habits with regular exercise and well balanced diet.   Goals:  1) Maintain strict control of hypertension with blood pressure goal below 130/90 2) Maintain good control of diabetes with hemoglobin A1c goal below 7%  3) Maintain good control of lipids with LDL cholesterol goal  below 70 mg/dL.  4) Eat a healthy diet with plenty of whole grains, cereals, fruits and vegetables, exercise regularly and maintain ideal body weight   Resources: [email or web address]  Please make sure you are staying well hydrated. I recommend 50-60 ounces daily. Well balanced diet and regular exercise encouraged. Consistent sleep schedule with 6-8 hours recommended.   Please continue follow up with care team as directed.   Follow up with me in 6 months   You may receive a survey regarding today's visit. I encourage you to leave honest feed back as I do use this information to improve patient care. Thank you for seeing me today!

## 2022-07-01 NOTE — Progress Notes (Signed)
[facility] [address] [place]. [place] [address] [phone]       HOSPITAL FOLLOW UP NOTE  [name] Date of Birth:  08/24/1985 Medical Record Number:  [id number]   Reason for Referral:  hospital stroke follow up   SUBJECTIVE:   CHIEF COMPLAINT:  Chief Complaint  Patient presents with   Follow-up    Pt in room 2. Here for hospital follow up in [facility] for stroke. Pt still having facial tingling, numbness at times, right arm and right leg numbness. Pt had Ct and MRI at [facility]. Pt had last speech therapy class last week, still in Physical therapy.     HPI:   Jesse Duncan is a 37 y.o. who  has a past medical history of Allergic rhinitis due to pollen (07/06/2010), Atrial fibrillation (HCC) (08/15/2015), Diabetes mellitus without complication (HCC), Dyslipidemia, Hyperlipidemia (05/25/2010), Hypertension, Impaired fasting blood sugar (08/15/2015), Obesity, OSA (obstructive sleep apnea) (10/20/2015), Prediabetes, Sleep apnea, Smoker, and Tobacco use disorder (11/14/2013).  Patient presented on 04/22/2022 for sudden onset of slurred speech, right upper ext weakness and numbness as well as vision disturbance. MRI revealed acute/subacute infarcts in left MCA and left PCA with minimal petechial hemorrhage and cytotoxic edema in left occipital lobe. CTA with left P2 PCA occlusion, severe left M2 stenosis, severe left intradural vertebral artery stenosis, moderate right M1 stenosis. He was started on asa 325mg  and Plavix for 3 months then asa 81mg  daily. He was discharged home 04/23/2022. On 05/20/2022, he returned to ER with right sided facial and right calf numbness. He reports upon waking, he had right middle face numbness and right calf numbness. Symptoms improved over 2 hours and by ER eval, right calf numbness and resolved. Facial numbness continued. CT and MRI unremarkable and he was discharged home. Personally reviewed hospitalization pertinent progress  notes, lab work and imaging.  Evaluated by Roda Shutters.   Since discharge, he reports doing failry well.   He was seen by Dr [name] 05/03/22. OT and ST referrals placed. Repeat ECHO ordered. EF 50-55%. He continues to have intermittent numbness of right sided facial, arm and leg. Symptoms wax and wane.  Speech is mostly back to baseline. He reports stuttering at times when he talks fast. Speech therapy has released him. He continues to work with PT. NO weakness.   BP has been up and down. He reports usual readings are around 155/90. CBGs are running around 160-175. He has a continuous glucose monitor. He continues metformin, Rybelsus, Toujeo and Fiasp. He stopped smoking at discharge. He has had trouble controlling his appetite since smoking cessation. He is eating more pretzels which he knows are higher in sodium. BP is usually arund 150-160 at home. He continues aspirin 325mg  and Plavix. He continues rosuvastatin 40mg  daily and is tolerating well.   He has a remote history of afib in setting of excessive caffeine use. He was cardioverted and started on Xarelto but was discontinued. He reports 30 day monitor was unremarkable.    He has a history of OSA. He does not use CPAP regularly. Dr Wyline Mood, cardiology, encouraged him to resume when seen 05/11/2022.   He was seen by Dr Lowell Guitar with bariatrics on 06/28/2022. He is being considered for surgery.    PERTINENT IMAGING/LABS  MRI  Positive for acute to early subacute large or medium-sized vessel type infarcts in both the Left MCA and Left PCA territories. Cytotoxic edema with minor petechial hemorrhage in the left occipital lobe. No malignant hemorrhagic transformation or  intracranial mass effect. CTA head & neck Occlusion of the distal left P2 PCA. Severe left M2 MCA stenosis. Severe proximal left intradural vertebral artery stenosis. Moderate right M1 MCA stenosis. 2D Echo EF 40 to 45%   A1C Lab Results  Component Value Date   HGBA1C 14.3 (H) 04/08/2022     Lipid Panel     Component Value Date/Time   CHOL 288 (H) 04/23/2022 0345   CHOL 298 (H) 09/28/2021 0953   TRIG 313 (H) 04/23/2022 0345   HDL 36 (L) 04/23/2022 0345   HDL 44 09/28/2021 0953   CHOLHDL 8.0 04/23/2022 0345   VLDL 63 (H) 04/23/2022 0345   LDLCALC 189 (H) 04/23/2022 0345   LDLCALC 208 (H) 09/28/2021 0953   LDLDIRECT 223.0 04/08/2022 1533   LABVLDL 46 (H) 09/28/2021 0953      ROS:   14 system review of systems performed and negative with exception of those listed in HPI  PMH:  Past Medical History:  Diagnosis Date   Allergic rhinitis due to pollen 07/06/2010   Atrial fibrillation (HCC) 08/15/2015   Diabetes mellitus without complication (HCC)    Dyslipidemia    Hyperlipidemia 05/25/2010   Hypertension    Impaired fasting blood sugar 08/15/2015   Obesity    OSA (obstructive sleep apnea) 10/20/2015   Prediabetes    Sleep apnea    Smoker    Tobacco use disorder 11/14/2013    PSH:  Past Surgical History:  Procedure Laterality Date   BREAST SURGERY      Social History:  Social History   Socioeconomic History   Marital status: Married    Spouse name: Not on file   Number of children: Not on file   Years of education: Not on file   Highest education level: Not on file  Occupational History   Not on file  Tobacco Use   Smoking status: Every Day    Types: Cigars    Passive exposure: Past   Smokeless tobacco: Never  Vaping Use   Vaping Use: Former  Substance and Sexual Activity   Alcohol use: Yes    Comment: occ   Drug use: Never   Sexual activity: Yes    Partners: Female  Other Topics Concern   Not on file  Social History Narrative   ** Merged History Encounter **       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      Social Determinants of Health   Financial Resource Strain: Not on file  Food Insecurity: No Food Insecurity (04/26/2022)   Hunger Vital Sign    Worried About Running Out of Food in the Last Year: Never true     Ran Out of Food in the Last Year: Never true  Transportation Needs: No Transportation Needs (04/26/2022)   PRAPARE - Administrator, Civil Service (Medical): No    Lack of Transportation (Non-Medical): No  Physical Activity: Not on file  Stress: Not on file  Social Connections: Not on file  Intimate Partner Violence: Not At Risk (04/22/2022)   Humiliation, Afraid, Rape, and Kick questionnaire    Fear of Current or Ex-Partner: No    Emotionally Abused: No    Physically Abused: No    Sexually Abused: No    Family History:  Family History  Problem Relation Age of Onset   Hypertension Mother    Healthy Father    COPD Father    Stroke Maternal Grandmother    Cancer  Maternal Grandfather    Heart disease Neg Hx     Medications:   Current Outpatient Medications on File Prior to Visit  Medication Sig Dispense Refill   aspirin EC 325 MG tablet Take 1 tablet (325 mg total) by mouth daily. Swallow whole. Take with Plavix for 90 days, after 90 stop plavix and continue on Aspirin 90 tablet 2   carvedilol (COREG) 12.5 MG tablet TAKE 1 TABLET (12.5MG  TOTAL) BY MOUTH TWICE A DAY WITH MEALS 180 tablet 0   clopidogrel (PLAVIX) 75 MG tablet Take 1 tablet (75 mg total) by mouth daily. Take along with aspirin for 90 days and then stop Plavix, but continue on ASA 90 tablet 0   Continuous Glucose Receiver (DEXCOM G7 RECEIVER) DEVI 1 Act by Does not apply route daily. 9 each 1   Continuous Glucose Sensor (DEXCOM G7 SENSOR) MISC 1 Act by Does not apply route daily. 9 each 1   diltiazem (CARDIZEM CD) 300 MG 24 hr capsule TAKE 1 CAPSULE BY MOUTH EVERY DAY 90 capsule 1   glucose blood test strip Use as instructed 100 each 12   hydrALAZINE (APRESOLINE) 25 MG tablet Take 1 tablet (25 mg total) by mouth 3 (three) times daily. 270 tablet 0   insulin aspart (FIASP) 100 UNIT/ML FlexTouch Pen Inject 10 Units into the skin 3 (three) times daily with meals. 15 mL 1   insulin glargine, 1 Unit Dial, (TOUJEO  SOLOSTAR) 300 UNIT/ML Solostar Pen Inject 40 Units into the skin daily. 45 mL 3   Insulin Pen Needle (BD PEN NEEDLE NANO U/F) 32G X 4 MM MISC 1 Act by Subdermal route in the morning, at noon, in the evening, and at bedtime. Use to inject insulin once daily 400 each 1   metFORMIN (GLUCOPHAGE) 1000 MG tablet Take 1 tablet (1,000 mg total) by mouth 2 (two) times daily with a meal. 180 tablet 0   pantoprazole (PROTONIX) 40 MG tablet Take 1 tablet (40 mg total) by mouth daily. 30 tablet 1   rosuvastatin (CRESTOR) 40 MG tablet Take 1 tablet (40 mg total) by mouth daily. 90 tablet 1   Semaglutide (RYBELSUS) 7 MG TABS Take 7 mg by mouth daily. 60 tablet 2   valsartan-hydrochlorothiazide (DIOVAN-HCT) 320-25 MG tablet Take 1 tablet by mouth daily. 90 tablet 3   No current facility-administered medications on file prior to visit.    Allergies:  No Known Allergies    OBJECTIVE:  Physical Exam  Vitals:   07/12/22 0804 07/12/22 0810  BP: (!) 155/107 (!) 160/107  Pulse: 64 61  Weight: 294 lb (133.4 kg)   Height: 6\' 1"  (1.854 m)    Body mass index is 38.79 kg/m. No results found.     09/28/2021    9:41 AM  Depression screen PHQ 2/9  Decreased Interest 0  Down, Depressed, Hopeless 0  PHQ - 2 Score 0     General: well developed, well nourished, seated, in no evident distress Head: head normocephalic and atraumatic.   Neck: supple with no carotid or supraclavicular bruits Cardiovascular: regular rate and rhythm, no murmurs Musculoskeletal: no deformity Skin:  no rash/petichiae Vascular:  Normal pulses all extremities   Neurologic Exam Mental Status: Awake and fully alert.  Fluent speech and language.  Oriented to place and time. Recent and remote memory intact. Attention span, concentration and fund of knowledge appropriate. Mood and affect appropriate.  Cranial Nerves: Fundoscopic exam reveals sharp disc margins. Pupils equal, briskly reactive to light. Extraocular movements  full  without nystagmus. Visual fields full to confrontation with exception of right lower field. Hearing intact. Facial sensation intact. Face, tongue, palate moves normally and symmetrically.  Motor: Normal bulk and tone. Normal strength in all tested extremity muscles Sensory.: intact to touch , pinprick , position and vibratory sensation.  Coordination: Rapid alternating movements normal in all extremities. Finger-to-nose and heel-to-shin performed accurately bilaterally. Gait and Station: Arises from chair without difficulty. Stance is normal. Gait demonstrates normal stride length and balance with no assistive.  Reflexes: 1+ and symmetric.    NIHSS  0 Modified Rankin  0    ASSESSMENT: Jesse Duncan is a 37 y.o. year old male presented on 04/22/2022 for sudden onset of slurred speech, right upper ext weakness and numbness as well as vision disturbance.. Vascular risk factors include HTN, HLD, DMT2, Tobacco use, hx afib, obesity, OSA.    PLAN:  Stroke:  acute/subacute infarcts in left MCA and left PCA, likely large vessel disease from left P2 occlusion and severe left M2 stenosis from uncontrolled risk factors  : Residual deficit: intermittent tingling and numbness of right side, right lower field vision loss. Continue aspirin 325 mg daily and clopidogrel 75 mg daily until 07/22/2022 then discontinue aspirin 325mg  and Plavix 75mg  and start aspirin 81mg  only. Continue  rosuvastatin 40mg  daily for secondary stroke prevention.  Discussed secondary stroke prevention measures and importance of close PCP follow up for aggressive stroke risk factor management. I have gone over the pathophysiology of stroke, warning signs and symptoms, risk factors and their management in some detail with instructions to go to the closest emergency room for symptoms of concern. HTN: BP goal <130/90. Elevated on Cardizem 300mg , Coreg 12.5mg  BID, hydralazine 25mg  TID and valsartan/hydrochlorothiazide 300-25mg  daily. Please  monitor closely. Continue meds and follow up per PCP and cardiology.  HLD: LDL goal <70. Recent LDL 189. Continue rosuvastatin 40mg  daily per PCP.  DMII: A1c goal<7.0. Recent A1c 14.3. Uncontrolled. Continue insulin, metformin and Rybelsus per Dr [name]. Please follow up closely with PCP.  Right lower field vision loss: seek ophthalmology evaluation to rule out other possible causes.  Past episode of afib: avoid caffeine. 30 day monitor. Continue follow up with cardiology.  Combined chronic systolic/diastolic HF: continue coreg and hydralizine per cardiology.  Tobacco use: continue cessation.  OSA: please resume use of CPAP Obesity: work on healthy lifestyle habits with regular exercise and well balanced diet.    Follow up in 6 months or call earlier if needed   CC:  [facility] provider: Dr. Pearlean Brownie PCP: [name], MD    I spent 45 minutes of face-to-face and non-face-to-face time with patient.  This included previsit chart review including review of recent hospitalization, lab review, study review, order entry, electronic health record documentation, patient education regarding recent stroke including etiology, secondary stroke prevention measures and importance of managing stroke risk factors, residual deficits and typical recovery time and answered all other questions to patient satisfaction   Shawnie Dapper, [facility]  [facility] [address] [place], [place] [address]  Phone [phone] Fax [phone] Note: This document was prepared with digital dictation and possible smart phrase technology. Any transcriptional errors that result from this process are unintentional.

## 2022-07-02 NOTE — Telephone Encounter (Signed)
Forms have completed and faxed back.

## 2022-07-07 ENCOUNTER — Ambulatory Visit: Payer: BC Managed Care – PPO

## 2022-07-07 ENCOUNTER — Ambulatory Visit: Payer: BC Managed Care – PPO | Attending: Internal Medicine | Admitting: Occupational Therapy

## 2022-07-07 DIAGNOSIS — M6281 Muscle weakness (generalized): Secondary | ICD-10-CM | POA: Insufficient documentation

## 2022-07-07 DIAGNOSIS — R41841 Cognitive communication deficit: Secondary | ICD-10-CM | POA: Diagnosis not present

## 2022-07-07 DIAGNOSIS — R278 Other lack of coordination: Secondary | ICD-10-CM | POA: Insufficient documentation

## 2022-07-07 DIAGNOSIS — I69351 Hemiplegia and hemiparesis following cerebral infarction affecting right dominant side: Secondary | ICD-10-CM | POA: Diagnosis not present

## 2022-07-07 DIAGNOSIS — R208 Other disturbances of skin sensation: Secondary | ICD-10-CM | POA: Diagnosis not present

## 2022-07-07 DIAGNOSIS — R471 Dysarthria and anarthria: Secondary | ICD-10-CM

## 2022-07-07 NOTE — Therapy (Signed)
OUTPATIENT SPEECH LANGUAGE PATHOLOGY TREATMENT   Patient Name: Jesse Duncan MRN: [id number] DOB:May 28, 1985, 37 y.o., male Today's Date: 07/07/2022  PCP: [name], MD REFERRING PROVIDER: [name], MD  END OF SESSION:  End of Session - 07/07/22 1524     Visit Number 3    Number of Visits 9    Date for SLP Re-Evaluation 07/12/22    SLP Start Time 1446    SLP Stop Time  1457    SLP Time Calculation (min) 11 min    Activity Tolerance Patient tolerated treatment well              Past Medical History:  Diagnosis Date   Allergic rhinitis due to pollen 07/06/2010   Atrial fibrillation (HCC) 08/15/2015   Diabetes mellitus without complication (HCC)    Dyslipidemia    Hyperlipidemia 05/25/2010   Hypertension    Impaired fasting blood sugar 08/15/2015   Obesity    OSA (obstructive sleep apnea) 10/20/2015   Prediabetes    Sleep apnea    Smoker    Tobacco use disorder 11/14/2013   Past Surgical History:  Procedure Laterality Date   BREAST SURGERY     Patient Active Problem List   Diagnosis Date Noted   Need for vaccination 05/03/2022   Class 2 severe obesity due to excess calories with serious comorbidity and body mass index (BMI) of 35.0 to 35.9 in adult (HCC) 05/03/2022   Deficiency anemia 05/03/2022   Stroke (HCC) 04/22/2022   Abnormal electrocardiogram (ECG) (EKG) 04/08/2022   Chronic combined systolic and diastolic heart failure (HCC) 09/28/2021   Tobacco use 09/28/2021   Insulin-requiring or dependent type II diabetes mellitus (HCC) 06/02/2021   Primary hypertension 06/02/2021   Hyperlipidemia associated with type 2 diabetes mellitus (HCC) 12/04/2018   OSA (obstructive sleep apnea) 10/20/2015   Atrial fibrillation (HCC) 08/15/2015   Hypogonadism male 07/12/2013   Allergic rhinitis due to pollen 07/06/2010   SPEECH THERAPY DISCHARGE SUMMARY  Visits from Start of Care: 3  Current functional level related to goals / functional outcomes: See  below. Pt's speech skills cont to improve- pt sees less and less frequent dysfluency, and states cognition and language are at baseline.   Remaining deficits: Infrequent dysfluency.   Education / Equipment: Compensations for dysfluency.   Patient agrees to discharge. Patient goals were met. Patient is being discharged due to being pleased with the current functional level.    ONSET DATE: 04/22/2022   REFERRING DIAG: M57.846 (ICD-10-CM) - Cerebrovascular accident (CVA) due to bilateral thrombosis of middle cerebral arteries (HCC)   THERAPY DIAG:  Cognitive communication deficit  Dysarthria and anarthria  Rationale for Evaluation and Treatment: Rehabilitation  SUBJECTIVE:   SUBJECTIVE STATEMENT: "Yesterday was the first day since the stroke that my mom caught me stuttering a little." Pt accompanied by: self  PERTINENT HISTORY: Per MD note "Patient is a 37 y.o.  male (at time of event) with history of HFrEF, PAF, HTN, HLD, DM-2, tobacco use who was referred to the ED by his PCP as outpatient MRI showed acute CVA.  Patient was having right upper extremity weakness, right facial weakness and dysarthria for the past several day."   PAIN:  Are you having pain? No  FALLS: Has patient fallen in last 6 months?  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 He endorses facial *mandibular/maxillary branch* numbness yesterday that has improved as well as waxing/waning speech difficulties. No focal CN deficits present - and pt's phonation as well as receptive  language is intact. SLUMS *[employer] Mental Status exam completed with pt scoring 26/30 - indicative of mild cog deficits per authors : 27-30 normal, 21-26 mild disorder, 1-20 severe. Strengths noted in areas of orientation, memory and visuospatial skills. Functional math question challenging for pt - correctly answered with visual cues- and pt reports this to be a new change. Expressive language deficits noted resulting in dysfluent output with novel communication due to word finding deficits. Pt is amenable to cues including phonemic cues, sentence completion, phrase completion, etc. Repetition at complex multisyllabic area resulted in frequent breakdown for pt. Reviewed findings and recommendations with pt using teach back. Follow up SLP at next venue of care indicated to maximize pt's cognitive linguistic skills especially given hefty demands in his life. 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 Pt agreeable to plan. Will follow up while pt in -house for treatment and family education.    TODAY'S TREATMENT:                                                                                                                                         DATE:  07/07/22: Pt arrives reporting further improvement in his speech, with very rare motor speech sx most often when he is excited or rushed. He has given three orientations at work without any speech issues. Wavely reports when he does have rare speech sx, he utilizes compensation of slowing speech rate and, if necessary, taking a cleansing breath - which area successful. Other than rare speech sx, pt reports all other language and cognitive skills have returned to WNL. He was comfortable with d/c today and had no other questions or concerns re: speech, language, or cognition.  06/09/22: Pt indicates his anomia, and cognitive skills are at baseline. He has been back to [facility] for over 2 weeks, and returns to  [employer] the end of this month (07/03/22). SLP conferred with pt about his STGs and LTGs set at eval (05/17/22) and he told SLP that many are now not necessary due to his progress since evaluation. SLP has either "met" or "deferred" those goals.  05/17/22: Education provided on evaluation results and SLP's recommendations. Initiated training re: fluency strategies (calming breath, pacing). Pt verbalizes agreement with POC, all questions answered to satisfaction.    PATIENT EDUCATION: Education details: see treatment section Person educated: Patient Education method: Explanation, Demonstration, and Handouts Education comprehension: verbalized understanding, returned demonstration, and needs further education  GOALS: Goals reviewed with patient? Yes  SHORT TERM GOALS: Target date: 06/14/2022  Pt will complete cognitive assessment, if indicated based on clinical judgement Baseline: Goal status: DEFERRED as pt states skills are essentially at baseline  2.  Pt will demonstrate dysarthria/fluency strategies in moderately complex generative speech or conversational speech tasks with rare min-A Baseline:  Goal status: MET/DEFERRED - pt's skills today were WNL  3.  Pt will verbalize appropriate cognitive strategy to support successful completion of x3 functional home or work based tasks Baseline:  Goal status: DEFERRED as pt states skills are essentially at baseline   LONG TERM GOALS: Target date: 07/12/2022  Pt will verbalize x3 cognitive compensations (attention, memory, executive function) he has implemented at work, resulting in successful performance of vocational duties Baseline:  Goal status: DEFERRED as pt states skills are essentially at baseline  2.  Pt will give 15 minute mock presentation on work related topic with successful use of dysarthria strategies and compensations PRN Baseline:  Goal status: MET, per pt report  3.  Pt will report subjective reduction in instances of "giving  up" when communicating with spouse or friends over 1 week period Baseline: always when dysfluencies occur, per pt reprot Goal status: DEFERRED as pt states she does not do this anymore  4.  Pt will report improved cognitive function via PROM by dc  Baseline:  Goal status: DEFERRED as pt states skills are essentially at baseline  ASSESSMENT:  CLINICAL IMPRESSION: Patient is a 37 y.o. M who was seen today for ensuring motor speech     is still progressing. Pt's speech today appears WNL, and pt states his cognition and language are WNL/baseline. Pt agreed with d/c today   OBJECTIVE IMPAIRMENTS: include attention, memory, executive functioning, expressive language, and dysarthria. These impairments are limiting patient from effectively communicating at home and in community and maximizing performance in vocational duties . Factors affecting potential to achieve goals and functional outcome are  none evidenced . Patient will benefit from skilled SLP services to address above impairments and improve overall function.  REHAB POTENTIAL: Good  PLAN: Discharge today.  PLANNED INTERVENTIONS: Language facilitation, Cueing hierachy, Cognitive reorganization, Internal/external aids, Functional tasks, SLP instruction and feedback, Compensatory strategies, and Patient/family education  [facility], CCC-SLP 07/07/2022, 3:24 PM

## 2022-07-07 NOTE — Therapy (Signed)
OUTPATIENT OCCUPATIONAL THERAPY NEURO TREATMENT  Patient Name: Riyad Brinser MRN: [id number] DOB:[birth year], 37 y.o., male Today's Date: 07/07/2022  PCP: [name], MD REFERRING PROVIDER: [name], MD     END OF SESSION:  OT End of Session - 07/07/22 1402     Visit Number 5    Number of Visits 13    Date for OT Re-Evaluation 07/30/22    Authorization Type [employer] PPO    OT Start Time 1402    OT Stop Time 1445    OT Time Calculation (min) 43 min    Activity Tolerance Patient tolerated treatment well;No increased pain;Patient limited by fatigue    Behavior During Therapy [facility] for tasks assessed/performed               Past Medical History:  Diagnosis Date   Allergic rhinitis due to pollen 07/06/2010   Atrial fibrillation (HCC) 08/15/2015   Diabetes mellitus without complication (HCC)    Dyslipidemia    Hyperlipidemia 05/25/2010   Hypertension    Impaired fasting blood sugar 08/15/2015   Obesity    OSA (obstructive sleep apnea) 10/20/2015   Prediabetes    Sleep apnea    Smoker    Tobacco use disorder 11/14/2013   Past Surgical History:  Procedure Laterality Date   BREAST SURGERY     Patient Active Problem List   Diagnosis Date Noted   Need for vaccination 05/03/2022   Class 2 severe obesity due to excess calories with serious comorbidity and body mass index (BMI) of 35.0 to 35.9 in adult (HCC) 05/03/2022   Deficiency anemia 05/03/2022   Stroke (HCC) 04/22/2022   Abnormal electrocardiogram (ECG) (EKG) 04/08/2022   Chronic combined systolic and diastolic heart failure (HCC) 09/28/2021   Tobacco use 09/28/2021   Insulin-requiring or dependent type II diabetes mellitus (HCC) 06/02/2021   Primary hypertension 06/02/2021   Hyperlipidemia associated with type 2 diabetes mellitus (HCC) 12/04/2018   OSA (obstructive sleep apnea) 10/20/2015   Atrial fibrillation (HCC) 08/15/2015   Hypogonadism male 07/12/2013   Allergic rhinitis due to pollen 07/06/2010     ONSET DATE: 04/22/22  REFERRING DIAG: W09.811 (ICD-10-CM) - Cerebrovascular accident (CVA) due to bilateral thrombosis of middle cerebral arteries  THERAPY DIAG:  Other lack of coordination  Muscle weakness (generalized)  Other disturbances of skin sensation  Hemiplegia and hemiparesis following cerebral infarction affecting right dominant side (HCC)  Rationale for Evaluation and Treatment: Rehabilitation  PERTINENT HISTORY: 37 y/o M admitted to [facility] on 4/18 for R arm weakness/numbness and speech problem. MRI shows early subacute large and medium sized vessel time infarct in both left MCA and left PCA. Cytotoxic edema with minor petechial hemorrhage in left occipital lobe. PMHx: PAF, HTN, chronic combined HFrEF and HFpEF, HLD, IDDM, cigarette smoking. Pt reports when he had the stroke he started stumbling over his words while he was at work.  Pt then reports feeling his face and arm going numb.  MD set him up for MRI where they noted infarcts.  Pt reports speech is still difficult, "knowing what to say but can't get it out" and stuttering comes and goes as he fatigues.  Pt reports arm still somewhat numb but when up and moving that he can maintain grasp but other times he still has difficulty with grasp and manipulation with pen for handwriting.  Pt reports still having difficulty with peripheral vision on R side.    PRECAUTIONS: None; WEIGHT BEARING RESTRICTIONS: No   SUBJECTIVE:   SUBJECTIVE STATEMENT:  He states "every day is a different day" but feeling that arm is improving overall.  Reports today is a good day for the R hand.    PAIN:  Are you having pain?   No, tingling in R hand  FALLS: Has patient fallen in last 6 months? No  LIVING ENVIRONMENT: Lives with: lives with their family (spouse and 3 children) Lives in: House/apartment Stairs: Yes: Internal: 17 steps; on right going up Has following equipment at home:  hand held shower head  PLOF: Independent, Independent  with basic ADLs, and Vocation/Vocational requirements: works at [employer], works 2 jobs  Merchandiser, retail for [employer].  Pt reports that he felt like he was outgoing and had a sense of humor and now feels that is gone.    PATIENT GOALS: to get my life back, function every day, getting the hand together   OBJECTIVE: (All objective assessments below are from initial evaluation on: 05/17/22 unless otherwise specified.)   HAND DOMINANCE: Right  ADLs: Overall ADLs: 06/30/22: He states he can't drive or return to work yet due to heavy lifting required at [employer]. Home tasks are getting easier.    IADLs: Wife would complete all IADLs, pt did drive prior to CVA but has not since Medication management: "take them every day now" Handwriting: Increased time Pt reports "its a struggle"   FUNCTIONAL OUTCOME MEASURES: FOTO: 55 on eval, predicted 75 at d/c  UPPER EXTREMITY ROM:  WFL bilaterally  UPPER EXTREMITY MMT:   WFL bilaterally  HAND FUNCTION: 06/30/22: Grip Rt:  1-13#; 2-20#; 3- 24#; 4- 13#; 5- 14#    Lat pinch Rt: 9#,     3pt Pinch Rt: 5#  06/16/22: Grip Rt: 22#  Eval: Grip strength: Right: 36 lbs; Left: 90 lbs, Lateral pinch: Right: 9 lbs, Left: 26 lbs, and 3 point pinch: Right: 7 lbs, Left: 21 lbs  COORDINATION: 06/30/22: 9HPT: 25.6sec Box & Blocks: 50 blocks today    Finger Nose Finger test: very mild slowing on R compared to L 9 Hole Peg test: Right: 29.12 sec; Left: 22.38 sec Box and Blocks:  Right 47 blocks, Left 55 blocks  SENSATION: 06/30/22: Rt hand med nerve S2PD test:  4mm; Ulnar nerve: 4mm   06/16/22:  Rt hand median and ulnar nerves test ~74mm Static 2-point discrimination today  (4-9mm is "normal")   Light touch: WFL Stereognosis: WFL Pt does report inconsistent sensation, and tingling in R hand  COGNITION: Overall cognitive status: 06/30/22: WFL- no deficits noted in OT   VISION & PERCEPTION 06/30/22: no noted visual-spatial issues in OT sessions recently, but Rt visual field  is still decreased and he does state using lighthouse strategy, having decreased Rt awareness still    TODAY'S TREATMENT:                                                                                          07/07/22 Return to work: engaged in discussion about return to work readiness.  Pt reports MD has written pt out of work until the end of July to allow for f/u with neurology and therapy appts. OT provided pt with [employer]  RTW readiness checklist and encouraged pt to complete with spouse to assess areas that are still deficient for return to work. Pt does report that he is still lacking physical strength to pick up the heavier items at his job. Hand Gripper: Assessed grip strength 20#, prior to task.  Utilized  RUE on level 20# with yellow spring, progressed to 35# with black spring. Pt picked up 1 inch blocks with gripper with 0 drops and no difficulty with yellow spring, min difficulty with 35#/black spring.  Pt demonstrating improved functional, sustained grasp up to 55# with hand gripper however still testing lower with dynamometer.   Pinch grip: engaged in isolated finger flexion and full finger flexion with medium resistance digiflex with good motor control.  OT educated on finger flexion with green theraputty to make mountain shape with fingers working together.   Vitals assessed at beginning of session: BP 158/92 HR 72.  Pt reports that he still has not discussed with PCP elevated BPs, therefore OT encouraged that he f/u as previous OT was to f/u with PCP.    06/30/22: He begins with BP check which is 156/159mmHg.  60BPM OT will send this to his PCP, as this remains high and he has not reached out to him yet, as asked.   Next, Pt performs AROM, gripping, and strength with Rt arm/hand against therapist's resistance for exercise/activities as well as new measures today. (See areas of note above for details).  OT also discusses home and functional tasks with the pt and reviews  goals. OT also reviews home exercises and strategies (inlcuding lighthouse and scanning) and provides review as below. Pt states understanding recommendations and desire to continue therapy as he feels he is still too weak to return to work and he is unconfident to return to driving (though he did drive to session today).   Exercises/Activities Reviewed:  - Fridge Door Stretch  - 4 x daily - 3-5 reps - 15 sec hold - Wrist Prayer Stretch  - 4 x daily - 3-5 reps - 15 sec hold - Wrist Flexion Stretch  - 4 x daily - 3-5 reps - 15 sec hold - Wrist Extension with Resistance  - 2-4 x daily - 1-2 sets - 10-15 reps - Wrist Flexion with Resistance  - 2-4 x daily - 1-2 sets - 10-15 reps - Hammer Stretch or Strength   - 2-4 x daily - 1-2 sets - 10-15 reps - Full Fist  - 2-3 x daily - 5 reps - "Duck Mouth" Strength  - 2-3 x daily - 5 reps - Finger Extension "Pizza!"   - 2-3 x daily - 5 reps - Thumb Opposition with Putty  - 2-3 x daily - 5 reps - Cutting Putty  - 2-3 x daily - 5 reps Patient Education - Lighthouse Strategy   PATIENT EDUCATION: Education details: Educated on role and purpose of OT as well as potential interventions and goals for therapy based on initial evaluation findings. Person educated: Patient Education method: Explanation, Demonstration, and Handouts Education comprehension: verbalized understanding and needs further education  HOME EXERCISE PROGRAM: Access Code: [email or web address] URL: [email or web address] Date: 06/09/2022 Prepared by: Fannie Knee  Additional Patient Education: - Lighthouse Strategy - In-Hand Manipulation Skills   GOALS: Goals reviewed with patient? No  SHORT TERM GOALS: Target date: 06/11/22  Pt will be independent with fine and gross motor control HEP. Baseline: Goal status: 06/30/22: MET 06/09/22: Progressing  2.  Pt will be independent in hand strengthening HEP. Baseline:  Goal status: 06/30/22: MET 06/09/22: progressing   3.  Pt will  verbalize understanding of compensatory strategies for impaired visual attention. Baseline:  Goal status: 06/30/22: MET 06/09/22: progressing   LONG TERM GOALS: Target date: 07/30/22  Pt will demonstrate improved fine motor coordination for ADLs as evidenced by decreasing 9 hole peg test score for RUE by 4 secs Baseline: Right: 29.12 sec; Left: 22.38 sec Goal status:06/30/22: MET 25sec   2.  Pt will demonstrate improved UE functional use for ADLs as evidenced by increasing box/ blocks score by 5 blocks with RUE Baseline: Right 47 blocks, Left 55 blocks Goal status: 06/30/22: Improved to 50 blocks now  3.  Pt will demonstrate improved grip strength by 5# to allow for increased ease and safety with opening refrigerator and food containers. Baseline: Right: 36 lbs; Left: 90 lbs Goal status: 06/30/22: Avg ~18-20# today worse now   4.  Pt will verbalize understanding of return to driving recommendations. Goal status: 06/30/22: MET (was referred to [facility])  5.  Pt will verbalize understanding of return to work recommendations and readiness. Goal status: 06/30/22: in process  6.  Pt will write and/or type 3-5 sentence paragraph in <5 mins with 90% accuracy. Goal status: 06/30/22: NT today, but he states he has not been practicing typing (not good)     ASSESSMENT:  CLINICAL IMPRESSION: Pt is demonstrating improved functional grip strength, however still not performing well on dynamometer strength assessment.  Pt demonstrating good awareness of decreased RUE sustained strength to engage in typical work tasks.  Pt encouraged to resume theraputty exercises and to continue to pursue other grip strengthening devices at home (pt has grip strengthener that he is using but is unsure of the resistance).  Reiterated return to work recommendations and plan to further assess and discuss adaptations as needed.    PLAN:  OT FREQUENCY: 1 x week  OT DURATION: 4 additional weeks from 06/30/22 -  07/30/22  PLANNED INTERVENTIONS: self care/ADL training, therapeutic exercise, therapeutic activity, neuromuscular re-education, passive range of motion, functional mobility training, ultrasound, moist heat, cryotherapy, patient/family education, visual/perceptual remediation/compensation, psychosocial skills training, energy conservation, coping strategies training, and DME and/or AE instructions  CONSULTED AND AGREED WITH PLAN OF CARE: Patient  PLAN FOR NEXT SESSION:  Focus mainly on: Warm up and stretches where tight, total arm strengthening (especially grip training), arm coordination trying to meet box and blocks goal, return to work tasks like lifting and moving heavy objects.  Keep monitoring blood pressure and make sure he has spoken to his doctor about this   Rosalio Loud, OTR/L 07/07/2022, 3:40 PM

## 2022-07-12 ENCOUNTER — Encounter: Payer: Self-pay | Admitting: Family Medicine

## 2022-07-12 ENCOUNTER — Ambulatory Visit (INDEPENDENT_AMBULATORY_CARE_PROVIDER_SITE_OTHER): Payer: BC Managed Care – PPO | Admitting: Family Medicine

## 2022-07-12 VITALS — BP 160/107 | HR 61 | Ht 73.0 in | Wt 294.0 lb

## 2022-07-12 DIAGNOSIS — I63313 Cerebral infarction due to thrombosis of bilateral middle cerebral arteries: Secondary | ICD-10-CM

## 2022-07-12 DIAGNOSIS — Z713 Dietary counseling and surveillance: Secondary | ICD-10-CM | POA: Diagnosis not present

## 2022-07-13 ENCOUNTER — Ambulatory Visit: Payer: Self-pay

## 2022-07-13 NOTE — Patient Outreach (Signed)
  Care Coordination   Follow Up Visit Note   07/13/2022 Name: Riyaan Heroux MRN: [id number] DOB: [birth year]  Tasman Zapata is a 37 y.o. year old male who sees [name], MD for primary care. I spoke with  [name] by phone today.  What matters to the patients health and wellness today?  Mr. Fitzhenry states does not have much time to talk. However he reports he is doing well. Confirms he attended neurology visit on yesterday. He states he has his medications and denies any questions or concerns at this time. Mr. Castagna reports he has RNCM contact number and will call RNCM if needed before next scheduled call.   Goals Addressed             This Visit's Progress    Care Coordination activities: continue to improve post hospitalization       Interventions Today    Flowsheet Row Most Recent Value  Chronic Disease   Chronic disease during today's visit Other  [stroke]  General Interventions   General Interventions Discussed/Reviewed General Interventions Reviewed, Doctor Visits  Doctor Visits Discussed/Reviewed Doctor Visits Discussed  [confirmed patient completed office visit with neurologist]  PCP/Specialist Visits Compliance with follow-up visit  Education Interventions   Education Provided Provided Education  Provided Verbal Education On Other  [advised continue to take medications as prescribed and follow up with provider as scheduled or with health questions/concerns.]  Pharmacy Interventions   Pharmacy Dicussed/Reviewed Pharmacy Topics Reviewed  [name] if patient had medications. Patient confirms has all his medications]            SDOH assessments and interventions completed:  No  Care Coordination Interventions:  Yes, provided   Follow up plan: Follow up call scheduled for 08/09/22    Encounter Outcome:  Pt. Visit Completed   [name], RN, MSN, BSN, CCM [facility] Care Coordinator [phone]

## 2022-07-13 NOTE — Therapy (Signed)
OUTPATIENT OCCUPATIONAL THERAPY NEURO TREATMENT  Patient Name: Jesse Duncan MRN: [id number] DOB:1985/03/07, 37 y.o., male Today's Date: 07/13/2022  PCP: [name], MD REFERRING PROVIDER: [name], MD     END OF SESSION:      Past Medical History:  Diagnosis Date   Allergic rhinitis due to pollen 07/06/2010   Atrial fibrillation (HCC) 08/15/2015   Diabetes mellitus without complication (HCC)    Dyslipidemia    Hyperlipidemia 05/25/2010   Hypertension    Impaired fasting blood sugar 08/15/2015   Obesity    OSA (obstructive sleep apnea) 10/20/2015   Prediabetes    Sleep apnea    Smoker    Tobacco use disorder 11/14/2013   Past Surgical History:  Procedure Laterality Date   BREAST SURGERY     Patient Active Problem List   Diagnosis Date Noted   Need for vaccination 05/03/2022   Class 2 severe obesity due to excess calories with serious comorbidity and body mass index (BMI) of 35.0 to 35.9 in adult [facility]) 05/03/2022   Deficiency anemia 05/03/2022   Stroke (HCC) 04/22/2022   Abnormal electrocardiogram (ECG) (EKG) 04/08/2022   Chronic combined systolic and diastolic heart failure (HCC) 09/28/2021   Tobacco use 09/28/2021   Insulin-requiring or dependent type II diabetes mellitus (HCC) 06/02/2021   Primary hypertension 06/02/2021   Hyperlipidemia associated with type 2 diabetes mellitus (HCC) 12/04/2018   OSA (obstructive sleep apnea) 10/20/2015   Atrial fibrillation (HCC) 08/15/2015   Hypogonadism male 07/12/2013   Allergic rhinitis due to pollen 07/06/2010    ONSET DATE: 04/22/22  REFERRING DIAG: W09.811 (ICD-10-CM) - Cerebrovascular accident (CVA) due to bilateral thrombosis of middle cerebral arteries  THERAPY DIAG:  No diagnosis found.  Rationale for Evaluation and Treatment: Rehabilitation  PERTINENT HISTORY: 37 y/o M admitted to [facility] on 4/18 for R arm weakness/numbness and speech problem. MRI shows early subacute large and medium sized vessel  time infarct in both left MCA and left PCA. Cytotoxic edema with minor petechial hemorrhage in left occipital lobe. PMHx: PAF, HTN, chronic combined HFrEF and HFpEF, HLD, IDDM, cigarette smoking. Pt reports when he had the stroke he started stumbling over his words while he was at work.  Pt then reports feeling his face and arm going numb.  MD set him up for MRI where they noted infarcts.  Pt reports speech is still difficult, "knowing what to say but can't get it out" and stuttering comes and goes as he fatigues.  Pt reports arm still somewhat numb but when up and moving that he can maintain grasp but other times he still has difficulty with grasp and manipulation with pen for handwriting.  Pt reports still having difficulty with peripheral vision on R side.    PRECAUTIONS: None; WEIGHT BEARING RESTRICTIONS: No   SUBJECTIVE:   SUBJECTIVE STATEMENT: He states ***  "every day is a different day" but feeling that arm is improving overall.  Reports today is a good day for the R hand.    PAIN:  Are you having pain? ***  No, tingling in R hand  FALLS: Has patient fallen in last 6 months? No  LIVING ENVIRONMENT: Lives with: lives with their family (spouse and 3 children) Lives in: House/apartment Stairs: Yes: Internal: 17 steps; on right going up Has following equipment at home:  hand held shower head  PLOF: Independent, Independent with basic ADLs, and Vocation/Vocational requirements: works at [employer], works 2 jobs  Merchandiser, retail for [employer].  Pt reports that he felt  like he was outgoing and had a sense of humor and now feels that is gone.    PATIENT GOALS: to get my life back, function every day, getting the hand together   OBJECTIVE: (All objective assessments below are from initial evaluation on: 05/17/22 unless otherwise specified.)   HAND DOMINANCE: Right  ADLs: Overall ADLs: 06/30/22: He states he can't drive or return to work yet due to heavy lifting required at [employer]. Home tasks are  getting easier.    IADLs: Wife would complete all IADLs, pt did drive prior to CVA but has not since Medication management: "take them every day now" Handwriting: Increased time Pt reports "its a struggle"   FUNCTIONAL OUTCOME MEASURES: FOTO: 55 on eval, predicted 75 at d/c  UPPER EXTREMITY ROM:  WFL bilaterally  UPPER EXTREMITY MMT:   WFL bilaterally  HAND FUNCTION: 06/30/22: Grip Rt:  1-13#; 2-20#; 3- 24#; 4- 13#; 5- 14#    Lat pinch Rt: 9#,     3pt Pinch Rt: 5#  06/16/22: Grip Rt: 22#  Eval: Grip strength: Right: 36 lbs; Left: 90 lbs, Lateral pinch: Right: 9 lbs, Left: 26 lbs, and 3 point pinch: Right: 7 lbs, Left: 21 lbs  COORDINATION: 06/30/22: 9HPT: 25.6sec Box & Blocks: 50 blocks today    Finger Nose Finger test: very mild slowing on R compared to L 9 Hole Peg test: Right: 29.12 sec; Left: 22.38 sec Box and Blocks:  Right 47 blocks, Left 55 blocks  SENSATION: 06/30/22: Rt hand med nerve S2PD test:  4mm; Ulnar nerve: 4mm   06/16/22:  Rt hand median and ulnar nerves test ~74mm Static 2-point discrimination today  (4-4mm is "normal")   Light touch: WFL Stereognosis: WFL Pt does report inconsistent sensation, and tingling in R hand   VISION & PERCEPTION 06/30/22: no noted visual-spatial issues in OT sessions recently, but Rt visual field is still decreased and he does state using lighthouse strategy, having decreased Rt awareness still    TODAY'S TREATMENT:                                                                                          07/14/22: ***   Focus mainly on: Warm up and stretches where tight, total arm strengthening (especially grip training), arm coordination trying to meet box and blocks goal, return to work tasks like lifting and moving heavy objects.  Keep monitoring blood pressure and make sure he has spoken to his doctor about this   07/07/22 Return to work: engaged in discussion about return to work readiness.  Pt reports MD has written pt out of  work until the end of July to allow for f/u with neurology and therapy appts. OT provided pt with [employer] RTW readiness checklist and encouraged pt to complete with spouse to assess areas that are still deficient for return to work. Pt does report that he is still lacking physical strength to pick up the heavier items at his job. Hand Gripper: Assessed grip strength 20#, prior to task.  Utilized  RUE on level 20# with yellow spring, progressed to 35# with black spring. Pt picked up 1 inch  blocks with gripper with 0 drops and no difficulty with yellow spring, min difficulty with 35#/black spring.  Pt demonstrating improved functional, sustained grasp up to 55# with hand gripper however still testing lower with dynamometer.   Pinch grip: engaged in isolated finger flexion and full finger flexion with medium resistance digiflex with good motor control.  OT educated on finger flexion with green theraputty to make mountain shape with fingers working together.  Vitals assessed at beginning of session: BP 158/92 HR 72.  Pt reports that he still has not discussed with PCP elevated BPs, therefore OT encouraged that he f/u as previous OT was to f/u with PCP.   06/30/22: He begins with BP check which is 156/121mmHg.  60BPM OT will send this to his PCP, as this remains high and he has not reached out to him yet, as asked.   Next, Pt performs AROM, gripping, and strength with Rt arm/hand against therapist's resistance for exercise/activities as well as new measures today. (See areas of note above for details).  OT also discusses home and functional tasks with the pt and reviews goals. OT also reviews home exercises and strategies (inlcuding lighthouse and scanning) and provides review as below. Pt states understanding recommendations and desire to continue therapy as he feels he is still too weak to return to work and he is unconfident to return to driving (though he did drive to session today).    Exercises/Activities Reviewed:  - Fridge Door Stretch  - 4 x daily - 3-5 reps - 15 sec hold - Wrist Prayer Stretch  - 4 x daily - 3-5 reps - 15 sec hold - Wrist Flexion Stretch  - 4 x daily - 3-5 reps - 15 sec hold - Wrist Extension with Resistance  - 2-4 x daily - 1-2 sets - 10-15 reps - Wrist Flexion with Resistance  - 2-4 x daily - 1-2 sets - 10-15 reps - Hammer Stretch or Strength   - 2-4 x daily - 1-2 sets - 10-15 reps - Full Fist  - 2-3 x daily - 5 reps - "Duck Mouth" Strength  - 2-3 x daily - 5 reps - Finger Extension "Pizza!"   - 2-3 x daily - 5 reps - Thumb Opposition with Putty  - 2-3 x daily - 5 reps - Cutting Putty  - 2-3 x daily - 5 reps Patient Education - Lighthouse Strategy   PATIENT EDUCATION: Education details: Educated on role and purpose of OT as well as potential interventions and goals for therapy based on initial evaluation findings. Person educated: Patient Education method: Explanation, Demonstration, and Handouts Education comprehension: verbalized understanding and needs further education  HOME EXERCISE PROGRAM: Access Code: [email or web address] URL: [email or web address] Date: 06/09/2022 Prepared by: Fannie Knee  Additional Patient Education: - Lighthouse Strategy - In-Hand Manipulation Skills   GOALS: Goals reviewed with patient? No  SHORT TERM GOALS: Target date: 06/11/22  Pt will be independent with fine and gross motor control HEP. Baseline: Goal status: 06/30/22: MET 06/09/22: Progressing  2.  Pt will be independent in hand strengthening HEP. Baseline:  Goal status: 06/30/22: MET 06/09/22: progressing   3.  Pt will verbalize understanding of compensatory strategies for impaired visual attention. Baseline:  Goal status: 06/30/22: MET 06/09/22: progressing   LONG TERM GOALS: Target date: 07/30/22  Pt will demonstrate improved fine motor coordination for ADLs as evidenced by decreasing 9 hole peg test score for RUE by 4  secs Baseline: Right: 29.12 sec; Left: 22.38 sec Goal status:06/30/22: MET  25sec   2.  Pt will demonstrate improved UE functional use for ADLs as evidenced by increasing box/ blocks score by 5 blocks with RUE Baseline: Right 47 blocks, Left 55 blocks Goal status: 06/30/22: Improved to 50 blocks now  3.  Pt will demonstrate improved grip strength by 5# to allow for increased ease and safety with opening refrigerator and food containers. Baseline: Right: 36 lbs; Left: 90 lbs Goal status: 06/30/22: Avg ~18-20# today worse now   4.  Pt will verbalize understanding of return to driving recommendations. Goal status: 06/30/22: MET (was referred to [facility])  5.  Pt will verbalize understanding of return to work recommendations and readiness. Goal status: 06/30/22: in process  6.  Pt will write and/or type 3-5 sentence paragraph in <5 mins with 90% accuracy. Goal status: 06/30/22: NT today, but he states he has not been practicing typing (not good)     ASSESSMENT:  CLINICAL IMPRESSION: 07/14/22: ***  07/07/22: Pt is demonstrating improved functional grip strength, however still not performing well on dynamometer strength assessment.  Pt demonstrating good awareness of decreased RUE sustained strength to engage in typical work tasks.  Pt encouraged to resume theraputty exercises and to continue to pursue other grip strengthening devices at home (pt has grip strengthener that he is using but is unsure of the resistance).  Reiterated return to work recommendations and plan to further assess and discuss adaptations as needed.    PLAN:  OT FREQUENCY: 1 x week  OT DURATION: 4 additional weeks from 06/30/22 - 07/30/22  PLANNED INTERVENTIONS: self care/ADL training, therapeutic exercise, therapeutic activity, neuromuscular re-education, passive range of motion, functional mobility training, ultrasound, moist heat, cryotherapy, patient/family education, visual/perceptual remediation/compensation, psychosocial  skills training, energy conservation, coping strategies training, and DME and/or AE instructions  CONSULTED AND AGREED WITH PLAN OF CARE: Patient  PLAN FOR NEXT SESSION:  ***   Fannie Knee, OTR/L 07/13/2022, 8:24 AM

## 2022-07-13 NOTE — Patient Instructions (Signed)
Visit Information  Thank you for taking time to visit with me today. Please don't hesitate to contact me if I can be of assistance to you.   Following are the goals we discussed today:  Continue to take medications as prescribed Continue to attend provider visits as scheduled Contact your provider with health questions or concerns as needed   Our next appointment is by telephone on 08/10/22 at 9:30 am.  Please call the care guide team at [phone] if you need to cancel or reschedule your appointment.   If you are experiencing a Mental Health or Behavioral Health Crisis or need someone to talk to, please call the Suicide and Crisis Lifeline: 31  [name], RN, MSN, BSN, CCM [facility] Care Coordinator [phone]

## 2022-07-14 ENCOUNTER — Ambulatory Visit: Payer: BC Managed Care – PPO | Admitting: Rehabilitative and Restorative Service Providers"

## 2022-07-14 ENCOUNTER — Encounter: Payer: Self-pay | Admitting: Rehabilitative and Restorative Service Providers"

## 2022-07-14 DIAGNOSIS — R208 Other disturbances of skin sensation: Secondary | ICD-10-CM

## 2022-07-14 DIAGNOSIS — M6281 Muscle weakness (generalized): Secondary | ICD-10-CM

## 2022-07-14 DIAGNOSIS — R635 Abnormal weight gain: Secondary | ICD-10-CM | POA: Diagnosis not present

## 2022-07-14 DIAGNOSIS — I69351 Hemiplegia and hemiparesis following cerebral infarction affecting right dominant side: Secondary | ICD-10-CM

## 2022-07-14 DIAGNOSIS — R41841 Cognitive communication deficit: Secondary | ICD-10-CM | POA: Diagnosis not present

## 2022-07-14 DIAGNOSIS — R278 Other lack of coordination: Secondary | ICD-10-CM | POA: Diagnosis not present

## 2022-07-14 DIAGNOSIS — R471 Dysarthria and anarthria: Secondary | ICD-10-CM | POA: Diagnosis not present

## 2022-07-15 DIAGNOSIS — Z794 Long term (current) use of insulin: Secondary | ICD-10-CM | POA: Diagnosis not present

## 2022-07-15 DIAGNOSIS — E119 Type 2 diabetes mellitus without complications: Secondary | ICD-10-CM | POA: Diagnosis not present

## 2022-07-15 DIAGNOSIS — I10 Essential (primary) hypertension: Secondary | ICD-10-CM | POA: Diagnosis not present

## 2022-07-15 DIAGNOSIS — Z01818 Encounter for other preprocedural examination: Secondary | ICD-10-CM | POA: Diagnosis not present

## 2022-07-15 DIAGNOSIS — Z8673 Personal history of transient ischemic attack (TIA), and cerebral infarction without residual deficits: Secondary | ICD-10-CM | POA: Diagnosis not present

## 2022-07-16 DIAGNOSIS — F4320 Adjustment disorder, unspecified: Secondary | ICD-10-CM | POA: Diagnosis not present

## 2022-07-21 ENCOUNTER — Ambulatory Visit: Payer: BC Managed Care – PPO | Admitting: Rehabilitative and Restorative Service Providers"

## 2022-07-21 ENCOUNTER — Encounter: Payer: BC Managed Care – PPO | Admitting: Occupational Therapy

## 2022-07-21 DIAGNOSIS — Z01818 Encounter for other preprocedural examination: Secondary | ICD-10-CM | POA: Diagnosis not present

## 2022-07-21 DIAGNOSIS — Z87891 Personal history of nicotine dependence: Secondary | ICD-10-CM | POA: Diagnosis not present

## 2022-07-21 DIAGNOSIS — F4320 Adjustment disorder, unspecified: Secondary | ICD-10-CM | POA: Diagnosis not present

## 2022-07-21 NOTE — Telephone Encounter (Signed)
More paperwork received and placed in provider's box up front.

## 2022-07-22 ENCOUNTER — Telehealth: Payer: Self-pay | Admitting: Internal Medicine

## 2022-07-22 NOTE — Telephone Encounter (Signed)
Pt called wanting a call back to discuss his situation with being on leave. Please advise.

## 2022-07-23 NOTE — Telephone Encounter (Signed)
Patient has called again about the paperwork from [facility] for his leave. Just wanting to know if we have received it an get an update.Please advise

## 2022-07-26 ENCOUNTER — Encounter: Payer: Self-pay | Admitting: Internal Medicine

## 2022-07-26 ENCOUNTER — Other Ambulatory Visit: Payer: Self-pay | Admitting: Internal Medicine

## 2022-07-26 NOTE — Telephone Encounter (Signed)
Pt has been informed that forms were completed and faxed on 6/28 per Epic media tab.   Pt inquiring if he can get a release letter to go back to work on 08/02/22.  Please advise.

## 2022-07-28 ENCOUNTER — Ambulatory Visit: Payer: BC Managed Care – PPO | Admitting: Occupational Therapy

## 2022-07-28 DIAGNOSIS — I69351 Hemiplegia and hemiparesis following cerebral infarction affecting right dominant side: Secondary | ICD-10-CM

## 2022-07-28 DIAGNOSIS — R278 Other lack of coordination: Secondary | ICD-10-CM | POA: Diagnosis not present

## 2022-07-28 DIAGNOSIS — R41841 Cognitive communication deficit: Secondary | ICD-10-CM | POA: Diagnosis not present

## 2022-07-28 DIAGNOSIS — M6281 Muscle weakness (generalized): Secondary | ICD-10-CM

## 2022-07-28 DIAGNOSIS — R471 Dysarthria and anarthria: Secondary | ICD-10-CM | POA: Diagnosis not present

## 2022-07-28 DIAGNOSIS — R208 Other disturbances of skin sensation: Secondary | ICD-10-CM | POA: Diagnosis not present

## 2022-07-28 NOTE — Therapy (Signed)
OUTPATIENT OCCUPATIONAL THERAPY NEURO TREATMENT & Discharge  Patient Name: Jesse Duncan MRN: [id number] DOB:11-08-1985, 37 y.o., male Today's Date: 07/28/2022  PCP: [name], MD REFERRING PROVIDER: [name], MD  OCCUPATIONAL THERAPY DISCHARGE SUMMARY  Visits from Start of Care: 7  Current functional level related to goals / functional outcomes: Pt is demonstrating increased activity tolerance, grip strength, and coordination as needed for ADLs and IADLs, pt does continue to fatigue with repetitions and exertion.     Remaining deficits: Decreased strength and endurance of dominant RUE compared to L, however improved from eval   Education / Equipment: Coordination, strengthening, and ROM HEP and endurance/energy conservation as pertaining to ADLs, IADLs, and return to work.  Patient agrees to discharge. Patient goals were met. Patient is being discharged due to meeting the stated rehab goals..     END OF SESSION:  OT End of Session - 07/28/22 1445     Visit Number 7    Number of Visits 13    Date for OT Re-Evaluation 07/30/22    Authorization Type [employer] PPO    OT Start Time 1405    OT Stop Time 1435    OT Time Calculation (min) 30 min    Activity Tolerance Patient tolerated treatment well;No increased pain;Patient limited by fatigue    Behavior During Therapy [facility] for tasks assessed/performed              Past Medical History:  Diagnosis Date   Allergic rhinitis due to pollen 07/06/2010   Atrial fibrillation (HCC) 08/15/2015   Diabetes mellitus without complication (HCC)    Dyslipidemia    Hyperlipidemia 05/25/2010   Hypertension    Impaired fasting blood sugar 08/15/2015   Obesity    OSA (obstructive sleep apnea) 10/20/2015   Prediabetes    Sleep apnea    Smoker    Tobacco use disorder 11/14/2013   Past Surgical History:  Procedure Laterality Date   BREAST SURGERY     Patient Active Problem List   Diagnosis Date Noted   Need for  vaccination 05/03/2022   Class 2 severe obesity due to excess calories with serious comorbidity and body mass index (BMI) of 35.0 to 35.9 in adult (HCC) 05/03/2022   Deficiency anemia 05/03/2022   Stroke (HCC) 04/22/2022   Abnormal electrocardiogram (ECG) (EKG) 04/08/2022   Chronic combined systolic and diastolic heart failure (HCC) 09/28/2021   Tobacco use 09/28/2021   Insulin-requiring or dependent type II diabetes mellitus (HCC) 06/02/2021   Primary hypertension 06/02/2021   Hyperlipidemia associated with type 2 diabetes mellitus (HCC) 12/04/2018   OSA (obstructive sleep apnea) 10/20/2015   Atrial fibrillation (HCC) 08/15/2015   Hypogonadism male 07/12/2013   Allergic rhinitis due to pollen 07/06/2010    ONSET DATE: 04/22/22  REFERRING DIAG: X32.440 (ICD-10-CM) - Cerebrovascular accident (CVA) due to bilateral thrombosis of middle cerebral arteries  THERAPY DIAG:  Other lack of coordination  Muscle weakness (generalized)  Hemiplegia and hemiparesis following cerebral infarction affecting right dominant side (HCC)  Rationale for Evaluation and Treatment: Rehabilitation  PERTINENT HISTORY: 37 y/o M admitted to [facility] on 4/18 for R arm weakness/numbness and speech problem. MRI shows early subacute large and medium sized vessel time infarct in both left MCA and left PCA. Cytotoxic edema with minor petechial hemorrhage in left occipital lobe. PMHx: PAF, HTN, chronic combined HFrEF and HFpEF, HLD, IDDM, cigarette smoking. Pt reports when he had the stroke he started stumbling over his words while he was at work.  Pt  then reports feeling his face and arm going numb.  MD set him up for MRI where they noted infarcts.  Pt reports speech is still difficult, "knowing what to say but can't get it out" and stuttering comes and goes as he fatigues.  Pt reports arm still somewhat numb but when up and moving that he can maintain grasp but other times he still has difficulty with grasp and manipulation  with pen for handwriting.  Pt reports still having difficulty with peripheral vision on R side.    PRECAUTIONS: None; WEIGHT BEARING RESTRICTIONS: No   SUBJECTIVE:   SUBJECTIVE STATEMENT: He states "things are improving" and "my arm is better than before but things will keep on increasing."  Reports having an appt next Weds with primary.     BP check to start: 152/31mmHg   PAIN:  Are you having pain? Not at rest.    FALLS: Has patient fallen in last 6 months? No  LIVING ENVIRONMENT: Lives with: lives with their family (spouse and 3 children) Lives in: House/apartment Stairs: Yes: Internal: 17 steps; on right going up Has following equipment at home:  hand held shower head  PLOF: Independent, Independent with basic ADLs, and Vocation/Vocational requirements: works at [employer], works 2 jobs  Merchandiser, retail for [employer].  Pt reports that he felt like he was outgoing and had a sense of humor and now feels that is gone.    PATIENT GOALS: to get my life back, function every day, getting the hand together   OBJECTIVE: (All objective assessments below are from initial evaluation on: 05/17/22 unless otherwise specified.)   HAND DOMINANCE: Right  ADLs: Overall ADLs: 06/30/22: He states he can't drive or return to work yet due to heavy lifting required at [employer]. Home tasks are getting easier.    IADLs: Wife would complete all IADLs, pt did drive prior to CVA but has not since Medication management: "take them every day now" Handwriting: Increased time Pt reports "its a struggle"   FUNCTIONAL OUTCOME MEASURES: FOTO: 55 on eval, predicted 75 at d/c  UPPER EXTREMITY ROM:  WFL bilaterally  UPPER EXTREMITY MMT:   WFL bilaterally  HAND FUNCTION: 07/28/22: 1 - 44# , 2 - 44#, 3 - 55#   07/14/22: Rt grip 31#   06/30/22: Grip Rt:  1-13#; 2-20#; 3- 24#; 4- 13#; 5- 14#    Lat pinch Rt: 9#,     3pt Pinch Rt: 5#  06/16/22: Grip Rt: 22#  Eval: Grip strength: Right: 36 lbs; Left: 90 lbs, Lateral  pinch: Right: 9 lbs, Left: 26 lbs, and 3 point pinch: Right: 7 lbs, Left: 21 lbs  COORDINATION: 06/30/22: 9HPT: 25.6sec Box & Blocks: 50 blocks today    Finger Nose Finger test: very mild slowing on R compared to L 9 Hole Peg test: Right: 29.12 sec; Left: 22.38 sec Box and Blocks:  Right 47 blocks, Left 55 blocks  SENSATION: 06/30/22: Rt hand med nerve S2PD test:  4mm; Ulnar nerve: 4mm   06/16/22:  Rt hand median and ulnar nerves test ~3mm Static 2-point discrimination today  (4-58mm is "normal")   Light touch: WFL Stereognosis: WFL Pt does report inconsistent sensation, and tingling in R hand   VISION & PERCEPTION 06/30/22: no noted visual-spatial issues in OT sessions recently, but Rt visual field is still decreased and he does state using lighthouse strategy, having decreased Rt awareness still    TODAY'S TREATMENT:  07/28/22:  Engaged in handwriting with pt writing 4 sentences with good legibility, but reporting fatigue at the end.  Discussed taking breaks, finger flexion/extension and thumb opposition as needed to "exercise" fingers during handwriting and typing as needed.  Assessed grip strength and box and blocks with pt demonstrating improvements with both strength (44, 44, 55#) and coordination (L:55 blocks).  Engaged in lifting and carrying 25# box from low and high surface to simulate aspects of work tasks.  Reiterated recommendations to discuss with supervisor possibility of slow return to work and/or initiating increased breaks during the day for increased endurance.    07/14/22: He starts with UBE on 3.5 resistance for 5 mins for fnl endurance and grip/arm strength. He does state fatigue. Next he performs the following strengthening and activities to increase strength and fnl endurance:   Reverse grip biceps curls (straight bar, bil) 10# x 15 (2 sets)  Catch/throw single Rt arm 4.4# ball  x25 Bball shot 5# x15 Farmer's carry 10# bil 40Ft, then 15# for 40 ft  Bowling activity with 6.6# Gripper squeeze to transfer markers x8 (set to 25# today)   He does intermittent wrist and FA stretches today as needed, He seems to demo better endurance, strength and ability now, but does fatigue at end of session.     07/07/22 Return to work: engaged in discussion about return to work readiness.  Pt reports MD has written pt out of work until the end of July to allow for f/u with neurology and therapy appts. OT provided pt with [employer] RTW readiness checklist and encouraged pt to complete with spouse to assess areas that are still deficient for return to work. Pt does report that he is still lacking physical strength to pick up the heavier items at his job. Hand Gripper: Assessed grip strength 20#, prior to task.  Utilized  RUE on level 20# with yellow spring, progressed to 35# with black spring. Pt picked up 1 inch blocks with gripper with 0 drops and no difficulty with yellow spring, min difficulty with 35#/black spring.  Pt demonstrating improved functional, sustained grasp up to 55# with hand gripper however still testing lower with dynamometer.   Pinch grip: engaged in isolated finger flexion and full finger flexion with medium resistance digiflex with good motor control.  OT educated on finger flexion with green theraputty to make mountain shape with fingers working together.  Vitals assessed at beginning of session: BP 158/92 HR 72.  Pt reports that he still has not discussed with PCP elevated BPs, therefore OT encouraged that he f/u as previous OT was to f/u with PCP.   PATIENT EDUCATION: Education details: Educated on role and purpose of OT as well as potential interventions and goals for therapy based on initial evaluation findings. Person educated: Patient Education method: Explanation, Demonstration, and Handouts Education comprehension: verbalized understanding and needs  further education  HOME EXERCISE PROGRAM: Access Code: [email or web address] URL: [email or web address] Date: 06/09/2022 Prepared by: Fannie Knee  Additional Patient Education: - Lighthouse Strategy - In-Hand Manipulation Skills   GOALS: Goals reviewed with patient? No  SHORT TERM GOALS: Target date: 06/11/22  Pt will be independent with fine and gross motor control HEP. Baseline: Goal status: 06/30/22: MET 06/09/22: Progressing  2.  Pt will be independent in hand strengthening HEP. Baseline:  Goal status: 06/30/22: MET 06/09/22: progressing   3.  Pt will verbalize understanding of compensatory strategies for impaired visual attention. Baseline:  Goal status: 06/30/22: MET 06/09/22: progressing   LONG  TERM GOALS: Target date: 07/30/22  Pt will demonstrate improved fine motor coordination for ADLs as evidenced by decreasing 9 hole peg test score for RUE by 4 secs Baseline: Right: 29.12 sec; Left: 22.38 sec Goal status:06/30/22: MET 25sec   2.  Pt will demonstrate improved UE functional use for ADLs as evidenced by increasing box/ blocks score by 5 blocks with RUE Baseline: Right 47 blocks, Left 55 blocks Goal status: MET - 55 on 07/28/22  3.  Pt will demonstrate improved grip strength by 5# to allow for increased ease and safety with opening refrigerator and food containers. Baseline: Right: 36 lbs; Left: 90 lbs Goal status: MET  - 47.6# on 07/28/22  4.  Pt will verbalize understanding of return to driving recommendations. Goal status: 06/30/22: MET (was referred to [facility])  5.  Pt will verbalize understanding of return to work recommendations and readiness. Goal status:  MET - 07/28/22  6.  Pt will write and/or type 3-5 sentence paragraph in <5 mins with 90% accuracy. Goal status: MET - 07/28/22    ASSESSMENT:  CLINICAL IMPRESSION: Pt is demonstrating increased activity tolerance, grip strength, and coordination as needed for ADLs and IADLs, pt does continue to fatigue  with repetitions and exertion.  Pt does intermittent wrist, forearm, and hand stretches today as needed.  Reiterated engagement in household tasks and increased challenge to progress to upcoming return to work. Pt is pleased with current status and agreeable to d/c at this time.    PLAN:  OT FREQUENCY: 1 x week  OT DURATION: 4 additional weeks from 06/30/22 - 07/30/22  PLANNED INTERVENTIONS: self care/ADL training, therapeutic exercise, therapeutic activity, neuromuscular re-education, passive range of motion, functional mobility training, ultrasound, moist heat, cryotherapy, patient/family education, visual/perceptual remediation/compensation, psychosocial skills training, energy conservation, coping strategies training, and DME and/or AE instructions  CONSULTED AND AGREED WITH PLAN OF CARE: Patient  PLAN FOR NEXT SESSION:   NA, D/c at this time.   Rosalio Loud, OTR/L 07/28/2022, 2:45 PM

## 2022-08-04 ENCOUNTER — Encounter: Payer: BC Managed Care – PPO | Admitting: Occupational Therapy

## 2022-08-10 ENCOUNTER — Ambulatory Visit: Payer: Self-pay

## 2022-08-10 NOTE — Patient Outreach (Signed)
  Care Coordination   Follow Up Visit Note   08/10/2022 Name: Jesse Duncan MRN: [id number] DOB: 1985/04/11  Jesse Duncan is a 37 y.o. year old male who sees Jesse Grandchild, MD for primary care. I spoke with  Jesse Duncan by phone today.  What matters to the patients health and wellness today?  Jesse Duncan reports he is back to work and states everything is going "ok". He states he needs to get an appointment with PCP regarding blood pressure ranging 150's/80-90. He also reports blood sugar ranges 130-140's. Last office visit with primary provider was 05/03/22 - per After visit summary was supposed to follow up around 08/02/22   Goals Addressed             This Visit's Progress    COMPLETED: Care Coordination activities: continue to improve post hospitalization       Interventions Today    Flowsheet Row Most Recent Value  Chronic Disease   Chronic disease during today's visit Other  [stroke]  General Interventions   General Interventions Discussed/Reviewed General Interventions Reviewed, Doctor Visits  Doctor Visits Discussed/Reviewed Doctor Visits Discussed  [confirmed patient completed office visit with neurologist]  PCP/Specialist Visits Compliance with follow-up visit  Education Interventions   Education Provided Provided Education  Provided Verbal Education On Other  [advised continue to take medications as prescribed and follow up with provider as scheduled or with health questions/concerns.]  Pharmacy Interventions   Pharmacy Dicussed/Reviewed Pharmacy Topics Reviewed  [name] if patient had medications. Patient confirms has all his medications]           health management       Interventions Today    Flowsheet Row Most Recent Value  Chronic Disease   Chronic disease during today's visit Diabetes, Hypertension (HTN)  General Interventions   General Interventions Discussed/Reviewed General Interventions Reviewed, Doctor Visits  Doctor Visits  Discussed/Reviewed Doctor Visits Reviewed, PCP  PCP/Specialist Visits Compliance with follow-up visit  [reviewed upcoming appointments,  encouraged to call to schedule appointment with PCP re: blood pressure and blood sugars.]  Exercise Interventions   Exercise Discussed/Reviewed Physical Activity  Education Interventions   Education Provided Provided Education  Provided Verbal Education On Medication, Blood Sugar Monitoring, Other  [advised to continue to take medications as prescribed, continue to eat healthy limit carbs such as rice, breads,  discussed how to get most accurate Blood pressure reading.]            SDOH assessments and interventions completed:  No  Care Coordination Interventions:  Yes, provided   Follow up plan: Follow up call scheduled for 09/13/22    Encounter Outcome:  Pt. Visit Completed   Jesse Sheriff, RN, MSN, BSN, CCM [facility] Care Coordinator [phone]

## 2022-08-10 NOTE — Patient Instructions (Addendum)
Visit Information  Thank you for taking time to visit with me today. Please don't hesitate to contact me if I can be of assistance to you.   Following are the goals we discussed today:  Attend provider visits as scheduled/recommended. Contact Primary Provider to schedule your follow up appointment. Take medications as prescribed Contact provider with health questions or concerns as needed   Our next appointment is by telephone on 09/13/22 at 10:00 am  Please call the care guide team at [phone] if you need to cancel or reschedule your appointment.   If you are experiencing a Mental Health or Behavioral Health Crisis or need someone to talk to, please call the Suicide and Crisis Lifeline: 988 call the [place] National Suicide Prevention Lifeline: (909)095-0262 or TTY: 206-760-8786 TTY (804) 254-3374) to talk to a trained counselor call 1-800-273-TALK (toll free, 24 hour hotline)  [name], RN, MSN, BSN, CCM [facility] Care Coordinator [phone]

## 2022-08-13 DIAGNOSIS — Z713 Dietary counseling and surveillance: Secondary | ICD-10-CM | POA: Diagnosis not present

## 2022-09-12 ENCOUNTER — Encounter (HOSPITAL_COMMUNITY): Payer: Self-pay

## 2022-09-12 ENCOUNTER — Emergency Department (HOSPITAL_COMMUNITY): Payer: MEDICAID

## 2022-09-12 ENCOUNTER — Other Ambulatory Visit: Payer: Self-pay

## 2022-09-12 ENCOUNTER — Emergency Department
Admission: EM | Admit: 2022-09-12 | Discharge: 2022-09-12 | Disposition: A | Discharge status: Home / Self Care | Payer: MEDICAID | Attending: Emergency Medicine | Admitting: Emergency Medicine

## 2022-09-12 DIAGNOSIS — X501XXA Overexertion from prolonged static or awkward postures, initial encounter: Secondary | ICD-10-CM | POA: Insufficient documentation

## 2022-09-12 DIAGNOSIS — X58XXXA Exposure to other specified factors, initial encounter: Secondary | ICD-10-CM

## 2022-09-12 DIAGNOSIS — S46011A Strain of muscle(s) and tendon(s) of the rotator cuff of right shoulder, initial encounter: Secondary | ICD-10-CM | POA: Insufficient documentation

## 2022-09-12 DIAGNOSIS — S46811A Strain of other muscles, fascia and tendons at shoulder and upper arm level, right arm, initial encounter: Secondary | ICD-10-CM

## 2022-09-12 MED ORDER — ORPHENADRINE CITRATE ER 100 MG TABLET,EXTENDED RELEASE
100.0000 mg | ORAL_TABLET | Freq: Two times a day (BID) | ORAL | 0 refills | Status: AC
Start: 2022-09-12 — End: 2022-09-19

## 2022-09-12 NOTE — ED Nurses Note (Signed)
Patient discharged home with family.  AVS reviewed with patient/care giver.  A written copy of the AVS and discharge instructions was given to the patient/care giver. Scripts handed to patient/care giver. Questions sufficiently answered as needed.  Patient/care giver encouraged to follow up with PCP as indicated.  In the event of an emergency, patient/care giver instructed to call 911 or go to the nearest emergency room.

## 2022-09-12 NOTE — ED Triage Notes (Addendum)
RT shoulder pain/injury while working on his truck yesterday  800 mg Ibuprofen taken at 0500

## 2022-09-12 NOTE — ED Provider Notes (Signed)
Department of Emergency Medicine  [facility]  09/12/2022        Chief Complaint   Patient presents with    Shoulder Injury       Patient is a 37 y.o.  male presenting to the ED with chief complaint of right shoulder pain.  Patient states he was working on his truck yesterday loosening a bolt with a Breaker bar when he felt a pull sensation and pop sensation in posterior aspect of the right shoulder.  Pain increases with certain range of motion of the shoulder.  He has no other acute complaints.       Review of Systems:    Constitutional: No fever, chills  Cardio: No chest pain, palpitations   Respiratory: No cough, wheezing or SOB  MSK:  Patient has pain in the right shoulder in the posterior aspect of the shoulder adjacent to the shoulder blade and medial to the thoracic spine.  Neuro: No numbness, tingling, or weakness.    All other symptoms reviewed and are negative, unless commented on in the HPI.       Past Medical History:  Past Medical History:   Diagnosis Date    Allergic rhinitis     Depression     Essential hypertension     Fracture of nasal bones     Headache     Nosebleed     Sinusitis     Viral hepatitis C      Past Surgical History:   Procedure Laterality Date    Hx hand surgery      Hx orbital surgery Left 03/31/2020     Above history reviewed with patient.  Allergies, medication list, and old records also reviewed.     Filed Vitals:    09/12/22 0551   BP: 132/86   Pulse: 82   Resp: 20   Temp: 36.5 C (97.7 F)   SpO2: 98%       Physical Exam:     Nursing note and vitals reviewed.  Vital signs reviewed as above. No acute distress.     Constitutional:  Patient is sitting upright in bed no apparent distress.  Head: Normocephalic and atraumatic.   Musculoskeletal:  Patient has tenderness over the supraspinatus musculature as well as the rhomboids mildly.  There is no evidence of tenderness over the infraspinatus.  Minimal tenderness at the glenohumeral joint.  He has intact range of  motion with elevation and abduction but abduction increases his pain.  Internal and external rotation is intact although internal and external rotation also exacerbate the pain.  Normal sensation over the axillary nerve distribution.  Normal radial pulse.  Normal sensation and motor over distal radial, median, and ulnar nerves.  Range of motion at the elbow hand and wrist do not elicit significant discomfort..   Neurological:  Patient alert and oriented x3.  o focal deficits noted.  Skin: Warm and dry. No rash or lesions  Psychiatric: Patient has a normal mood and affect.     Workup:     Labs:  No results found for this or any previous visit (from the past 24 hour(s)).    Imaging:         Orders Placed This Encounter    XR SHOULDER RIGHT    FOLLOW-UP: ORTHOPEDICS [facility] [place], [place]    orphenadrine citrate (NORFLEX) 100 mg Oral Tablet Sustained Release       Abnormal Lab results:  Labs Ordered/Reviewed - No data to  display    No results found for this visit on 09/12/22 (from the past 720 hour(s)).     Plan: Appropriate labs and imaging ordered. Medical Records reviewed.    MDM:   During the patient's stay in the emergency department, the above listed imaging and/or labs were performed to assist with medical decision making and were reviewed by myself when available for review.     Differential diagnosis (includes but is not limited to):  Supraspinatus injury    MDM:   MDM Narrative: [name] is a 37 y.o. male presenting to the emergency department and had concerns including Shoulder Injury.        Problems addressed during this encounter:   Clinical Impression   Supraspinatus sprain, right, initial encounter (Primary)         Amount and/or Complexity of Data Reviewed:   See results section for testing ordered during this encounter.  Independent Historian/External Data Reviewed: None    Patient has evidence for shoulder strain of the supraspinatus musculature of the right shoulder.   Patient has some chronic changes at the acromioclavicular joint seen on previous x-ray, no other bony abnormality.  Patient is given a muscle relaxer to use in addition to him taking 800 ibuprofen 3 times a day and should follow up with Orthopedics.        Risk of complications and/or morbidity or mortality of patient management:         Following the history, physical exam, and ED workup, the patient was deemed stable and suitable for discharge. The patient/caregiver was advised to return to the ED for any new or worsening symptoms. Discharge medications, and follow-up instructions were discussed with the patient/caregiver in detail, who verbalizes understanding. The patient/caregiver is in agreement and is comfortable with the plan of care.    Disposition: Discharged         Current Discharge Medication List        START taking these medications.        Details   orphenadrine citrate 100 mg Tablet Sustained Release  Commonly known as: NORFLEX   100 mg, Oral, 2 TIMES DAILY  Qty: 14 Tablet  Refills: 0            CONTINUE these medications - NO CHANGES were made during your visit.        Details   buprenorphine HCL 2 mg Tablet, Sublingual  Commonly known as: SUBUTEX   8 mg, Sublingual, 2 TIMES DAILY  Refills: 0            Follow up:   Orthopedics, [facility]  [address]  [place] [address]  [phone]                 I have independently reviewed radiology tests to include right shoulder x-ray    External data reviewed:None    Discussion with consultants: None    Chronic Medical Conditions/Past Surgical Conditions managed: None    Risk Details/medical decision making regarding admission:    Discharge home medications:  Nor flex    Pt remained stable throughout the emergency department course.      Impression and plan:  Shoulder strain, plan as above      Critical care time: 0    Sherryle Lis, MD  09/12/2022, 06:08    This note was partially generated using MModal Fluency Direct  system, and there may be some incorrect words, spellings, and punctuation that were not noted in  checking the note before saving.

## 2022-09-13 ENCOUNTER — Telehealth: Payer: Self-pay

## 2022-09-13 NOTE — Patient Outreach (Signed)
  Care Coordination   09/13/2022 Name: Jesse Duncan MRN: [id number] DOB: 1985/09/12   Care Coordination Outreach Attempts:  An unsuccessful telephone outreach was attempted for a scheduled appointment today.  Follow Up Plan:  Additional outreach attempts will be made to offer the patient care coordination information and services.   Encounter Outcome:  No Answer   Care Coordination Interventions:  No, not indicated    [name], RN, MSN, BSN, CCM [facility] Care Coordinator [phone]

## 2022-09-15 ENCOUNTER — Telehealth: Payer: Self-pay | Admitting: *Deleted

## 2022-09-15 NOTE — Progress Notes (Signed)
  Care Coordination Note  09/15/2022 Name: Jesse Duncan MRN: [id number] DOB: 08-Jul-1985  Jesse Duncan is a 37 y.o. year old male who is a primary care patient of [name], MD and is actively engaged with the care management team. I reached out to [name] by phone today to assist with re-scheduling a follow up visit with the RN Case Manager  Follow up plan: Unsuccessful telephone outreach attempt made. A HIPAA compliant phone message was left for the patient providing contact information and requesting a return call.   Burman Nieves, CCMA Care Coordination Care Guide Direct Dial: [phone]

## 2022-09-17 ENCOUNTER — Encounter: Payer: Self-pay | Admitting: Pharmacist

## 2022-09-20 DIAGNOSIS — Z713 Dietary counseling and surveillance: Secondary | ICD-10-CM | POA: Diagnosis not present

## 2022-09-22 ENCOUNTER — Encounter: Payer: Self-pay | Admitting: Internal Medicine

## 2022-09-22 ENCOUNTER — Ambulatory Visit (INDEPENDENT_AMBULATORY_CARE_PROVIDER_SITE_OTHER): Payer: BC Managed Care – PPO | Admitting: Internal Medicine

## 2022-09-22 VITALS — BP 146/96 | HR 68 | Temp 98.5°F | Ht 73.0 in | Wt 311.0 lb

## 2022-09-22 DIAGNOSIS — E7801 Familial hypercholesterolemia: Secondary | ICD-10-CM

## 2022-09-22 DIAGNOSIS — I480 Paroxysmal atrial fibrillation: Secondary | ICD-10-CM

## 2022-09-22 DIAGNOSIS — Z794 Long term (current) use of insulin: Secondary | ICD-10-CM

## 2022-09-22 DIAGNOSIS — Z1159 Encounter for screening for other viral diseases: Secondary | ICD-10-CM

## 2022-09-22 DIAGNOSIS — E1169 Type 2 diabetes mellitus with other specified complication: Secondary | ICD-10-CM

## 2022-09-22 DIAGNOSIS — E785 Hyperlipidemia, unspecified: Secondary | ICD-10-CM | POA: Diagnosis not present

## 2022-09-22 DIAGNOSIS — E119 Type 2 diabetes mellitus without complications: Secondary | ICD-10-CM | POA: Diagnosis not present

## 2022-09-22 DIAGNOSIS — I5042 Chronic combined systolic (congestive) and diastolic (congestive) heart failure: Secondary | ICD-10-CM

## 2022-09-22 DIAGNOSIS — R002 Palpitations: Secondary | ICD-10-CM | POA: Diagnosis not present

## 2022-09-22 DIAGNOSIS — I10 Essential (primary) hypertension: Secondary | ICD-10-CM | POA: Diagnosis not present

## 2022-09-22 DIAGNOSIS — D539 Nutritional anemia, unspecified: Secondary | ICD-10-CM

## 2022-09-22 LAB — BASIC METABOLIC PANEL WITH GFR
BUN: 14 mg/dL (ref 6–23)
CO2: 26 meq/L (ref 19–32)
Calcium: 9.6 mg/dL (ref 8.4–10.5)
Chloride: 103 meq/L (ref 96–112)
Creatinine, Ser: 0.96 mg/dL (ref 0.40–1.50)
GFR: 101.83 mL/min (ref >60.00)
Glucose, Bld: 102 mg/dL — ABNORMAL HIGH (ref 70–99)
Potassium: 3.8 meq/L (ref 3.5–5.1)
Sodium: 136 meq/L (ref 135–145)

## 2022-09-22 LAB — CBC WITH DIFFERENTIAL/PLATELET
Basophils Absolute: 0 10*3/uL (ref 0.0–0.1)
Basophils Relative: 0.4 % (ref 0.0–3.0)
Eosinophils Absolute: 0.1 10*3/uL (ref 0.0–0.7)
Eosinophils Relative: 2.8 % (ref 0.0–5.0)
HCT: 40.4 % (ref 39.0–52.0)
Hemoglobin: 13 g/dL (ref 13.0–17.0)
Lymphocytes Relative: 54.8 % — ABNORMAL HIGH (ref 12.0–46.0)
Lymphs Abs: 2.6 10*3/uL (ref 0.7–4.0)
MCHC: 32.1 g/dL (ref 30.0–36.0)
MCV: 80.4 fl (ref 78.0–100.0)
Monocytes Absolute: 0.4 10*3/uL (ref 0.1–1.0)
Monocytes Relative: 8.5 % (ref 3.0–12.0)
Neutro Abs: 1.6 10*3/uL (ref 1.4–7.7)
Neutrophils Relative %: 33.5 % — ABNORMAL LOW (ref 43.0–77.0)
Platelets: 292 10*3/uL (ref 150.0–400.0)
RBC: 5.02 Mil/uL (ref 4.22–5.81)
RDW: 13.6 % (ref 11.5–15.5)
WBC: 4.8 10*3/uL (ref 4.0–10.5)

## 2022-09-22 LAB — LIPID PANEL
Cholesterol: 163 mg/dL (ref 0–200)
HDL: 40.2 mg/dL (ref >39.00)
LDL Cholesterol: 79 mg/dL (ref 0–99)
NonHDL: 122.53
Total CHOL/HDL Ratio: 4
Triglycerides: 217 mg/dL — ABNORMAL HIGH (ref 0.0–149.0)
VLDL: 43.4 mg/dL — ABNORMAL HIGH (ref 0.0–40.0)

## 2022-09-22 LAB — TSH: TSH: 1.74 u[IU]/mL (ref 0.35–5.50)

## 2022-09-22 LAB — IBC + FERRITIN
Ferritin: 335.8 ng/mL — ABNORMAL HIGH (ref 22.0–322.0)
Iron: 64 ug/dL (ref 42–165)
Saturation Ratios: 20.1 % (ref 20.0–50.0)
TIBC: 319.2 ug/dL (ref 250.0–450.0)
Transferrin: 228 mg/dL (ref 212.0–360.0)

## 2022-09-22 LAB — HEMOGLOBIN A1C: Hgb A1c MFr Bld: 6.9 % — ABNORMAL HIGH (ref 4.6–6.5)

## 2022-09-22 MED ORDER — HYDRALAZINE HCL 25 MG PO TABS
25.0000 mg | ORAL_TABLET | Freq: Three times a day (TID) | ORAL | 0 refills | Status: DC
Start: 2022-09-22 — End: 2022-12-22

## 2022-09-22 MED ORDER — TIRZEPATIDE 2.5 MG/0.5ML ~~LOC~~ SOAJ: 2.5000 mg | SUBCUTANEOUS | 0 refills | Status: DC

## 2022-09-22 MED ORDER — METFORMIN HCL ER 750 MG PO TB24
750.0000 mg | ORAL_TABLET | Freq: Every day | ORAL | 1 refills | Status: DC
Start: 2022-09-22 — End: 2022-12-16

## 2022-09-22 MED ORDER — ROSUVASTATIN CALCIUM 40 MG PO TABS: 40.0000 mg | ORAL_TABLET | Freq: Every day | ORAL | 1 refills | Status: DC

## 2022-09-22 NOTE — Progress Notes (Signed)
Subjective:  Patient ID: Jesse Duncan, male    DOB: Jul 15, 1985  Age: 37 y.o. MRN: [id number]  CC: Hypertension, Hyperlipidemia, and Diabetes   HPI Jesse Duncan presents for f/up ----  Discussed the use of AI scribe software for clinical note transcription with the patient, who gave verbal consent to proceed.  History of Present Illness   The patient, with a history of hypertension and diabetes, presents with concerns about managing their blood pressure. They report difficulty adhering to a thrice-daily hydralazine regimen due to the inconvenience of multiple daily doses. They deny experiencing any symptoms of hypertension such as headaches, blurred vision, chest pain, or shortness of breath.  Two weeks prior, they experienced an episode of tachycardia, described as a 'racing' heart, lasting approximately 20 minutes. This occurred on two consecutive days while on vacation. During these episodes, they felt weak but did not experience dizziness or syncope. They speculate that the episodes may have been triggered by dietary changes, specifically the consumption of smoothies. No similar episodes have occurred since.  In addition to hypertension, they are also managing diabetes with a continuous glucose monitor. Their 90-day average glucose level is around 180. They deny any symptoms of hyperglycemia or hypoglycemia. They express interest in a weight management medication, Mounjaro, to assist with weight control and insulin balance. Their last EKG was performed in May of the current year.       Outpatient Medications Prior to Visit  Medication Sig Dispense Refill   aspirin EC 325 MG tablet Take 1 tablet (325 mg total) by mouth daily. Swallow whole. Take with Plavix for 90 days, after 90 stop plavix and continue on Aspirin 90 tablet 2   clopidogrel (PLAVIX) 75 MG tablet Take 1 tablet (75 mg total) by mouth daily. Take along with aspirin for 90 days and then stop Plavix, but continue on  ASA 90 tablet 0   Continuous Glucose Receiver (DEXCOM G7 RECEIVER) DEVI 1 Act by Does not apply route daily. 9 each 1   Continuous Glucose Sensor (DEXCOM G7 SENSOR) MISC 1 Act by Does not apply route daily. 9 each 1   diltiazem (CARDIZEM CD) 300 MG 24 hr capsule TAKE 1 CAPSULE BY MOUTH EVERY DAY 90 capsule 1   glucose blood test strip Use as instructed 100 each 12   Insulin Pen Needle (BD PEN NEEDLE NANO U/F) 32G X 4 MM MISC 1 Act by Subdermal route in the morning, at noon, in the evening, and at bedtime. Use to inject insulin once daily 400 each 1   pantoprazole (PROTONIX) 40 MG tablet Take 1 tablet (40 mg total) by mouth daily. 30 tablet 1   carvedilol (COREG) 12.5 MG tablet TAKE 1 TABLET (12.5MG  TOTAL) BY MOUTH TWICE A DAY WITH MEALS 180 tablet 0   hydrALAZINE (APRESOLINE) 25 MG tablet Take 1 tablet (25 mg total) by mouth 3 (three) times daily. 270 tablet 0   insulin aspart (FIASP) 100 UNIT/ML FlexTouch Pen Inject 10 Units into the skin 3 (three) times daily with meals. 15 mL 1   insulin glargine, 1 Unit Dial, (TOUJEO SOLOSTAR) 300 UNIT/ML Solostar Pen Inject 40 Units into the skin daily. 45 mL 3   metFORMIN (GLUCOPHAGE) 1000 MG tablet Take 1 tablet (1,000 mg total) by mouth 2 (two) times daily with a meal. 180 tablet 0   rosuvastatin (CRESTOR) 40 MG tablet Take 1 tablet (40 mg total) by mouth daily. 90 tablet 1   Semaglutide (RYBELSUS) 7 MG TABS Take 7 mg by  mouth daily. 60 tablet 2   valsartan-hydrochlorothiazide (DIOVAN-HCT) 320-25 MG tablet Take 1 tablet by mouth daily. 90 tablet 3   No facility-administered medications prior to visit.    ROS Review of Systems  Constitutional:  Positive for unexpected weight change (wt gain). Negative for chills, diaphoresis and fatigue.  Eyes:  Negative for visual disturbance.  Respiratory:  Positive for apnea. Negative for chest tightness, shortness of breath and wheezing.   Cardiovascular:  Positive for palpitations. Negative for chest pain and leg  swelling.  Gastrointestinal:  Negative for abdominal pain, diarrhea, nausea and vomiting.  Genitourinary: Negative.  Negative for difficulty urinating.  Musculoskeletal: Negative.   Skin: Negative.   Neurological:  Negative for dizziness, weakness and light-headedness.  Hematological:  Negative for adenopathy. Does not bruise/bleed easily.  Psychiatric/Behavioral: Negative.      Objective:  BP (!) 146/96 (BP Location: Right Arm, Patient Position: Sitting, Cuff Size: Large)   Pulse 68   Temp 98.5 F (36.9 C)   Ht 6\' 1"  (1.854 m)   Wt (!) 311 lb (141.1 kg)   SpO2 95%   BMI 41.03 kg/m   BP Readings from Last 3 Encounters:  09/22/22 (!) 146/96  07/12/22 (!) 160/107  05/20/22 (!) 138/94    Wt Readings from Last 3 Encounters:  09/22/22 (!) 311 lb (141.1 kg)  07/12/22 294 lb (133.4 kg)  05/20/22 273 lb 5.9 oz (124 kg)    Physical Exam Vitals reviewed.  Constitutional:      Appearance: He is obese.  HENT:     Nose: Nose normal.     Mouth/Throat:     Mouth: Mucous membranes are moist.  Eyes:     General: No scleral icterus.    Conjunctiva/sclera: Conjunctivae normal.  Cardiovascular:     Rate and Rhythm: Normal rate and regular rhythm.     Heart sounds: Normal heart sounds, S1 normal and S2 normal. No murmur heard.    No gallop.     Comments: EKG- NSR, 67 bpm Minimal LVH +++artifact No Q waves Pulmonary:     Effort: Pulmonary effort is normal.     Breath sounds: No stridor. No wheezing, rhonchi or rales.  Abdominal:     General: Abdomen is flat.     Palpations: There is no mass.     Tenderness: There is no abdominal tenderness. There is no guarding.     Hernia: No hernia is present.  Musculoskeletal:     Cervical back: Neck supple.     Right lower leg: No edema.     Left lower leg: No edema.  Lymphadenopathy:     Cervical: No cervical adenopathy.  Skin:    General: Skin is warm and dry.  Neurological:     General: No focal deficit present.     Mental  Status: He is alert. Mental status is at baseline.  Psychiatric:        Mood and Affect: Mood normal.        Behavior: Behavior normal.     Lab Results  Component Value Date   WBC 4.8 09/22/2022   HGB 13.0 09/22/2022   HCT 40.4 09/22/2022   PLT 292.0 09/22/2022   GLUCOSE 102 (H) 09/22/2022   CHOL 163 09/22/2022   TRIG 217.0 (H) 09/22/2022   HDL 40.20 09/22/2022   LDLDIRECT 223.0 04/08/2022   LDLCALC 79 09/22/2022   ALT 37 05/20/2022   AST 30 05/20/2022   NA 136 09/22/2022   K 3.8 09/22/2022   CL  103 09/22/2022   CREATININE 0.96 09/22/2022   BUN 14 09/22/2022   CO2 26 09/22/2022   TSH 1.74 09/22/2022   PSA 1.57 10/18/2012   INR 1.0 05/20/2022   HGBA1C 6.9 (H) 09/22/2022   MICROALBUR 1.9 05/03/2022    MR BRAIN WO CONTRAST  Result Date: 05/20/2022 CLINICAL DATA:  Neuro deficit, stroke suspected EXAM: MRI HEAD WITHOUT CONTRAST TECHNIQUE: Multiplanar, multiecho pulse sequences of the brain and surrounding structures were obtained without intravenous contrast. COMPARISON:  04/22/2022 FINDINGS: Brain: No evidence of acute or subacute infarct.Increased signal on diffusion-weighted imaging in the left parietooccipital region and left splenium of the corpus callosum, which are without definite ADC correlates and likely reflect T2 shine through. No acute hemorrhage, mass, mass effect, or midline shift. No hydrocephalus or extra-axial collection. Normal pituitary and craniocervical junction. Curvilinear susceptibility is associated with the left parietooccipital infarct, likely petechial hemorrhage. Additional focus of hemosiderin deposition left lentiform nucleus (series 4, image 57), which correlates with a remote lacunar infarct. Additional cortical encephalomalacia and lacunar infarcts in the posterior left frontal lobe (series 3, image 35. Vascular: Normal arterial flow voids. Skull and upper cervical spine: Normal marrow signal. Sinuses/Orbits: Mucous retention cyst in the left  maxillary sinus. Mild mucosal thickening in the ethmoid air cells. No acute finding in the orbits. Other: The mastoid air cells are well aerated. IMPRESSION: No acute intracranial process. No evidence of acute or subacute infarct. Electronically Signed   By: Wiliam Ke M.D.   On: 05/20/2022 17:13   CT HEAD WO CONTRAST  Result Date: 05/20/2022 CLINICAL DATA:  Stroke suspected, numbness/tingling of right face and right EXAM: CT HEAD WITHOUT CONTRAST TECHNIQUE: Contiguous axial images were obtained from the base of the skull through the vertex without intravenous contrast. RADIATION DOSE REDUCTION: This exam was performed according to the departmental dose-optimization program which includes automated exposure control, adjustment of the mA and/or kV according to patient size and/or use of iterative reconstruction technique. COMPARISON:  04/22/2022 FINDINGS: Brain: Possible hypodensity in the right anterior temporal lobe (series 3, image 11), although this area is prone to artifact. Hypodensity in the left PCA territory, consistent with expected evolution of the infarcts noted on the 04/22/2022 exam. The previously noted left MCA territory infarcts are less conspicuous and only caused minimal encephalomalacia (series 3, images 21 and 22). No evidence of acute hemorrhage, mass, mass effect, or midline shift. No hydrocephalus or extra-axial fluid collection. Vascular: No hyperdense vessel. Atherosclerotic calcifications in the intracranial carotid and vertebral arteries. Skull: Negative for fracture or focal lesion. Sinuses/Orbits: Small mucous retention cyst in the left maxillary sinus. No acute finding in the orbits. Other: The mastoid air cells are well aerated. IMPRESSION: 1. Possible hypodensity in the right anterior temporal lobe. Although this area is prone to artifact, this could an acute infarct. MRI is recommended for further evaluation. 2. Evolution of previously noted left MCA and PCA territory  infarcts. 3. No acute intracranial hemorrhage. These results were called by telephone at the time of interpretation on 05/20/2022 at 1:52 pm to provider COOPER ROBBINS , who verbally acknowledged these results. Electronically Signed   By: Wiliam Ke M.D.   On: 05/20/2022 13:53    Assessment & Plan:   Insulin-requiring or dependent type II diabetes mellitus (HCC)- A1C is 6.9%. Will discontinue insulin. -     Hemoglobin A1c; Future -     Tirzepatide; Inject 2.5 mg into the skin once a week.  Dispense: 2 mL; Refill: 0 -  metFORMIN HCl ER; Take 1 tablet (750 mg total) by mouth daily with breakfast.  Dispense: 90 tablet; Refill: 1  Primary hypertension- Will try to get better control of his BP. -     Basic metabolic panel; Future -     CBC with Differential/Platelet; Future -     EKG 12-Lead -     TSH; Future -     hydrALAZINE HCl; Take 1 tablet (25 mg total) by mouth 3 (three) times daily.  Dispense: 270 tablet; Refill: 0 -     Carvedilol; Take 1 tablet (12.5 mg total) by mouth 2 (two) times daily with a meal.  Dispense: 180 tablet; Refill: 0  Hyperlipidemia associated with type 2 diabetes mellitus (HCC)- LDL goal achieved. Doing well on the statin  -     Lipid panel; Future -     TSH; Future -     Rosuvastatin Calcium; Take 1 tablet (40 mg total) by mouth daily.  Dispense: 90 tablet; Refill: 1  Intermittent palpitations - EKG is reassuring. -     TSH; Future  Need for hepatitis C screening test -     Hepatitis C antibody; Future  Deficiency anemia -     IBC + Ferritin -     Zinc -     Vitamin B1 -     Reticulocytes  Familial hypercholesteremia -     Rosuvastatin Calcium; Take 1 tablet (40 mg total) by mouth daily.  Dispense: 90 tablet; Refill: 1  Chronic combined systolic and diastolic heart failure (HCC) -     hydrALAZINE HCl; Take 1 tablet (25 mg total) by mouth 3 (three) times daily.  Dispense: 270 tablet; Refill: 0 -     Carvedilol; Take 1 tablet (12.5 mg total) by mouth  2 (two) times daily with a meal.  Dispense: 180 tablet; Refill: 0  Paroxysmal atrial fibrillation (HCC)- He has good R/R control. -     Carvedilol; Take 1 tablet (12.5 mg total) by mouth 2 (two) times daily with a meal.  Dispense: 180 tablet; Refill: 0     Follow-up: Return in about 4 months (around 01/22/2023).  Sanda Linger, MD

## 2022-09-22 NOTE — Patient Instructions (Signed)

## 2022-09-23 ENCOUNTER — Other Ambulatory Visit: Payer: Self-pay

## 2022-09-23 ENCOUNTER — Other Ambulatory Visit: Payer: Self-pay | Admitting: Internal Medicine

## 2022-09-23 DIAGNOSIS — I480 Paroxysmal atrial fibrillation: Secondary | ICD-10-CM

## 2022-09-23 DIAGNOSIS — I5042 Chronic combined systolic (congestive) and diastolic (congestive) heart failure: Secondary | ICD-10-CM

## 2022-09-23 DIAGNOSIS — I10 Essential (primary) hypertension: Secondary | ICD-10-CM

## 2022-09-23 MED ORDER — CARVEDILOL 12.5 MG PO TABS
12.5000 mg | ORAL_TABLET | Freq: Two times a day (BID) | ORAL | 0 refills | Status: DC
Start: 2022-09-23 — End: 2023-05-16

## 2022-09-23 NOTE — Progress Notes (Signed)
Care Coordination Note  09/23/2022 Name: Jesse Duncan MRN: [id number] DOB: [birth year]  Jesse Duncan is a 37 y.o. year old male who is a primary care patient of [name], MD and is actively engaged with the care management team. I reached out to [name] by phone today to assist with re-scheduling a follow up visit with the RN Case Manager  Follow up plan: We have been unable to make contact with the patient for follow up.   Burman Nieves, CCMA Care Coordination Care Guide Direct Dial: [phone]

## 2022-09-26 LAB — VITAMIN B1: Vitamin B1 (Thiamine): 14 nmol/L (ref 8–30)

## 2022-09-28 ENCOUNTER — Ambulatory Visit (HOSPITAL_BASED_OUTPATIENT_CLINIC_OR_DEPARTMENT_OTHER): Payer: BC Managed Care – PPO | Admitting: Sports Medicine

## 2022-09-29 LAB — ZINC: Zinc: 75 ug/dL (ref 60–130)

## 2022-09-29 LAB — RETICULOCYTES
ABS Retic: 150300 cells/uL — ABNORMAL HIGH (ref 25000–90000)
Retic Ct Pct: 3 %

## 2022-09-29 LAB — HEPATITIS C ANTIBODY: Hepatitis C Ab: NONREACTIVE

## 2022-09-30 ENCOUNTER — Ambulatory Visit: Payer: Self-pay

## 2022-09-30 NOTE — Patient Outreach (Signed)
Care Coordination   Case Closure  Visit Note   09/30/2022 Name: Jesse Duncan MRN: [id number] DOB: 1985/06/06  Jesse Duncan is a 37 y.o. year old male who sees [name], MD for primary care.   RNCM received message from care guide-unsuccessful attempts to make contact with the patient for follow up. Case closed.      Goals Addressed             This Visit's Progress    COMPLETED: health management       Interventions Today    Flowsheet Row Most Recent Value  General Interventions   General Interventions Discussed/Reviewed General Interventions Reviewed  [unsuccessful attempts to maintain contact with patient goals completed.]            SDOH assessments and interventions completed:  No  Care Coordination Interventions:  No, not indicated   Follow up plan: No further intervention required.   Encounter Outcome:  Patient Visit Completed   [name], RN, MSN, BSN, CCM Care Management Coordinator [phone]

## 2022-10-04 ENCOUNTER — Other Ambulatory Visit: Payer: Self-pay

## 2022-10-05 DIAGNOSIS — Z01818 Encounter for other preprocedural examination: Secondary | ICD-10-CM | POA: Diagnosis not present

## 2022-10-05 DIAGNOSIS — E119 Type 2 diabetes mellitus without complications: Secondary | ICD-10-CM | POA: Diagnosis not present

## 2022-10-05 DIAGNOSIS — I10 Essential (primary) hypertension: Secondary | ICD-10-CM | POA: Diagnosis not present

## 2022-10-05 DIAGNOSIS — Z713 Dietary counseling and surveillance: Secondary | ICD-10-CM | POA: Diagnosis not present

## 2022-10-05 DIAGNOSIS — D509 Iron deficiency anemia, unspecified: Secondary | ICD-10-CM | POA: Diagnosis not present

## 2022-10-11 DIAGNOSIS — Z713 Dietary counseling and surveillance: Secondary | ICD-10-CM | POA: Diagnosis not present

## 2022-10-13 ENCOUNTER — Other Ambulatory Visit: Payer: Self-pay | Admitting: Critical Care Medicine

## 2022-10-13 DIAGNOSIS — I110 Hypertensive heart disease with heart failure: Secondary | ICD-10-CM | POA: Diagnosis not present

## 2022-10-13 DIAGNOSIS — E785 Hyperlipidemia, unspecified: Secondary | ICD-10-CM | POA: Diagnosis not present

## 2022-10-13 DIAGNOSIS — I5020 Unspecified systolic (congestive) heart failure: Secondary | ICD-10-CM | POA: Diagnosis not present

## 2022-10-13 DIAGNOSIS — G4733 Obstructive sleep apnea (adult) (pediatric): Secondary | ICD-10-CM | POA: Diagnosis not present

## 2022-10-16 ENCOUNTER — Other Ambulatory Visit: Payer: Self-pay | Admitting: Critical Care Medicine

## 2022-10-18 NOTE — Telephone Encounter (Signed)
Requested Prescriptions  Refused Prescriptions Disp Refills   valsartan-hydrochlorothiazide (DIOVAN-HCT) 320-25 MG tablet [Pharmacy Med Name: VALSARTAN-HCTZ 320-25 MG TAB] 90 tablet 3    Sig: TAKE 1 TABLET BY MOUTH EVERY DAY     Cardiovascular: ARB + Diuretic Combos Failed - 10/16/2022  8:29 AM      Failed - Last BP in normal range    BP Readings from Last 1 Encounters:  09/22/22 (!) 146/96         Failed - Valid encounter within last 6 months    Recent Outpatient Visits           1 year ago Type 2 diabetes mellitus with hyperglycemia, with long-term current use of insulin (HCC)   [place] [facility] Storm Frisk, MD       Future Appointments             In 4 weeks Branch, Alben Spittle, MD [facility] - K in normal range and within 180 days    Potassium  Date Value Ref Range Status  09/22/2022 3.8 3.5 - 5.1 mEq/L Final         Passed - Na in normal range and within 180 days    Sodium  Date Value Ref Range Status  09/22/2022 136 135 - 145 mEq/L Final  09/28/2021 138 134 - 144 mmol/L Final         Passed - Cr in normal range and within 180 days    Creat  Date Value Ref Range Status  08/26/2016 0.94 0.60 - 1.35 mg/dL Final   Creatinine, Ser  Date Value Ref Range Status  09/22/2022 0.96 0.40 - 1.50 mg/dL Final   Creatinine, POC  Date Value Ref Range Status  01/17/2019 43.7 mg/dL Final   Creatinine,U  Date Value Ref Range Status  05/03/2022 96.3 mg/dL Final         Passed - eGFR is 10 or above and within 180 days    GFR calc Af Amer  Date Value Ref Range Status  12/07/2019 121 >59 mL/min/1.73 Final    Comment:    **In accordance with recommendations from the NKF-ASN Task force,**   [facility] is in the process of updating its eGFR calculation to the   2021 CKD-EPI creatinine equation that estimates kidney function   without a race variable.    GFR, Estimated  Date Value Ref Range  Status  05/20/2022 >60 >60 mL/min Final    Comment:    (NOTE) Calculated using the CKD-EPI Creatinine Equation (2021)    GFR  Date Value Ref Range Status  09/22/2022 101.83 >60.00 mL/min Final    Comment:    Calculated using the CKD-EPI Creatinine Equation (2021)   eGFR  Date Value Ref Range Status  09/28/2021 106 >59 mL/min/1.73 Final         Passed - Patient is not pregnant

## 2022-10-19 ENCOUNTER — Other Ambulatory Visit: Payer: Self-pay | Admitting: Critical Care Medicine

## 2022-10-19 NOTE — Telephone Encounter (Signed)
Requested Prescriptions  Pending Prescriptions Disp Refills   diltiazem (CARDIZEM CD) 300 MG 24 hr capsule [Pharmacy Med Name: DILTIAZEM 24H ER(CD) 300 MG CP] 90 capsule 1    Sig: TAKE 1 CAPSULE BY MOUTH EVERY DAY     Cardiovascular: Calcium Channel Blockers 3 Failed - 10/19/2022 11:04 AM      Failed - Last BP in normal range    BP Readings from Last 1 Encounters:  09/22/22 (!) 146/96         Failed - Valid encounter within last 6 months    Recent Outpatient Visits           1 year ago Type 2 diabetes mellitus with hyperglycemia, with long-term current use of insulin [facility])   [place] [facility] Storm Frisk, MD       Future Appointments             In 3 weeks Branch, Alben Spittle, MD [place] - ALT in normal range and within 360 days    ALT  Date Value Ref Range Status  05/20/2022 37 0 - 44 U/L Final         Passed - AST in normal range and within 360 days    AST  Date Value Ref Range Status  05/20/2022 30 15 - 41 U/L Final         Passed - Cr in normal range and within 360 days    Creat  Date Value Ref Range Status  08/26/2016 0.94 0.60 - 1.35 mg/dL Final   Creatinine, Ser  Date Value Ref Range Status  09/22/2022 0.96 0.40 - 1.50 mg/dL Final   Creatinine, POC  Date Value Ref Range Status  01/17/2019 43.7 mg/dL Final   Creatinine,U  Date Value Ref Range Status  05/03/2022 96.3 mg/dL Final         Passed - Last Heart Rate in normal range    Pulse Readings from Last 1 Encounters:  09/22/22 68

## 2022-10-21 ENCOUNTER — Other Ambulatory Visit: Payer: Self-pay | Admitting: Internal Medicine

## 2022-10-21 ENCOUNTER — Encounter (INDEPENDENT_AMBULATORY_CARE_PROVIDER_SITE_OTHER): Payer: Self-pay

## 2022-10-21 ENCOUNTER — Emergency Department (HOSPITAL_COMMUNITY): Payer: MEDICAID

## 2022-10-21 ENCOUNTER — Emergency Department (HOSPITAL_COMMUNITY): Payer: BC Managed Care – PPO

## 2022-10-21 ENCOUNTER — Encounter (HOSPITAL_COMMUNITY): Payer: Self-pay

## 2022-10-21 ENCOUNTER — Emergency Department
Admission: EM | Admit: 2022-10-21 | Discharge: 2022-10-21 | Disposition: A | Discharge status: Home / Self Care | Payer: MEDICAID | Attending: Emergency Medicine | Admitting: Emergency Medicine

## 2022-10-21 ENCOUNTER — Other Ambulatory Visit: Payer: Self-pay

## 2022-10-21 ENCOUNTER — Telehealth (INDEPENDENT_AMBULATORY_CARE_PROVIDER_SITE_OTHER): Payer: Self-pay

## 2022-10-21 DIAGNOSIS — E119 Type 2 diabetes mellitus without complications: Secondary | ICD-10-CM

## 2022-10-21 DIAGNOSIS — S0990XA Unspecified injury of head, initial encounter: Secondary | ICD-10-CM | POA: Insufficient documentation

## 2022-10-21 DIAGNOSIS — R0683 Snoring: Secondary | ICD-10-CM | POA: Insufficient documentation

## 2022-10-21 DIAGNOSIS — R0789 Other chest pain: Secondary | ICD-10-CM

## 2022-10-21 DIAGNOSIS — M542 Cervicalgia: Secondary | ICD-10-CM | POA: Insufficient documentation

## 2022-10-21 DIAGNOSIS — W19XXXA Unspecified fall, initial encounter: Secondary | ICD-10-CM

## 2022-10-21 DIAGNOSIS — S199XXA Unspecified injury of neck, initial encounter: Secondary | ICD-10-CM

## 2022-10-21 DIAGNOSIS — Z136 Encounter for screening for cardiovascular disorders: Secondary | ICD-10-CM

## 2022-10-21 DIAGNOSIS — R109 Unspecified abdominal pain: Secondary | ICD-10-CM

## 2022-10-21 DIAGNOSIS — W010XXA Fall on same level from slipping, tripping and stumbling without subsequent striking against object, initial encounter: Secondary | ICD-10-CM | POA: Insufficient documentation

## 2022-10-21 DIAGNOSIS — M549 Dorsalgia, unspecified: Secondary | ICD-10-CM | POA: Insufficient documentation

## 2022-10-21 LAB — ETHANOL, SERUM/PLASMA
ETHANOL: <10 mg/dL (ref <10)
ETHANOL: NOT DETECTED

## 2022-10-21 LAB — ECG 12-LEAD
Atrial Rate: 77 {beats}/min
Calculated P Axis: 16 degrees
Calculated R Axis: 35 degrees
Calculated T Axis: 47 degrees
PR Interval: 126 ms
QRS Duration: 84 ms
QT Interval: 394 ms
QTC Calculation: 445 ms
Ventricular rate: 77 {beats}/min

## 2022-10-21 LAB — BASIC METABOLIC PANEL
ANION GAP: 10 mmol/L (ref 4–13)
BUN/CREA RATIO: 15 (ref 6–22)
BUN: 13 mg/dL (ref 8–25)
CALCIUM: 9.5 mg/dL (ref 8.6–10.2)
CHLORIDE: 108 mmol/L (ref 96–111)
CO2 TOTAL: 21 mmol/L — ABNORMAL LOW (ref 22–30)
CREATININE: 0.89 mg/dL (ref 0.75–1.35)
ESTIMATED GFR - MALE: >90 mL/min/BSA (ref >=60)
GLUCOSE: 93 mg/dL (ref 65–125)
POTASSIUM: 3.5 mmol/L (ref 3.5–5.1)
SODIUM: 139 mmol/L (ref 136–145)

## 2022-10-21 LAB — CBC WITH DIFF
BASOPHIL #: <0.1 10*3/uL (ref <=0.20)
BASOPHIL %: 0.5 %
EOSINOPHIL #: 0.11 10*3/uL (ref <=0.50)
EOSINOPHIL %: 1.8 %
HCT: 42.2 % (ref 38.9–52.0)
HGB: 14.3 g/dL (ref 13.4–17.5)
IMMATURE GRANULOCYTE #: <0.1 10*3/uL (ref <0.10)
IMMATURE GRANULOCYTE %: 0.3 % (ref 0.0–1.0)
LYMPHOCYTE #: 2.42 10*3/uL (ref 1.00–4.80)
LYMPHOCYTE %: 40.1 %
MCH: 28.3 pg (ref 26.0–32.0)
MCHC: 33.9 g/dL (ref 31.0–35.5)
MCV: 83.6 fL (ref 78.0–100.0)
MONOCYTE #: 0.67 10*3/uL (ref 0.20–1.10)
MONOCYTE %: 11.1 %
MPV: 9.9 fL (ref 8.7–12.5)
NEUTROPHIL #: 2.78 10*3/uL (ref 1.50–7.70)
NEUTROPHIL %: 46.2 %
PLATELETS: 268 10*3/uL (ref 150–400)
RBC: 5.05 10*6/uL (ref 4.50–6.10)
RDW-CV: 12.2 % (ref 11.5–15.5)
WBC: 6 10*3/uL (ref 3.7–11.0)

## 2022-10-21 LAB — MAGNESIUM: MAGNESIUM: 2.1 mg/dL (ref 1.8–2.6)

## 2022-10-21 MED ORDER — TIRZEPATIDE 5 MG/0.5ML ~~LOC~~ SOAJ
5.0000 mg | SUBCUTANEOUS | 0 refills | Status: DC
Start: 2022-10-21 — End: 2022-11-19

## 2022-10-21 MED ORDER — FENTANYL (PF) 50 MCG/ML INJECTION SOLUTION
100.0000 ug | INTRAMUSCULAR | Status: AC
Start: 2022-10-21 — End: 2022-10-21
  Administered 2022-10-21: 100 ug via INTRAVENOUS
  Filled 2022-10-21: qty 2

## 2022-10-21 MED ORDER — IOPAMIDOL 370 MG IODINE/ML (76 %) INTRAVENOUS SOLUTION
100.0000 mL | INTRAVENOUS | Status: AC
Start: 2022-10-21 — End: 2022-10-21
  Administered 2022-10-21: 100 mL via INTRAVENOUS

## 2022-10-21 MED ORDER — ACETAMINOPHEN 500 MG TABLET
1000.0000 mg | ORAL_TABLET | Freq: Four times a day (QID) | ORAL | Status: AC | PRN
Start: 2022-10-21 — End: 2022-10-28

## 2022-10-21 MED ORDER — IBUPROFEN 600 MG TABLET
600.0000 mg | ORAL_TABLET | Freq: Four times a day (QID) | ORAL | Status: DC | PRN
Start: 2022-10-21 — End: 2022-11-12

## 2022-10-21 NOTE — Discharge Instructions (Addendum)
He was seen evaluated [facility] ED treated fall.  Imaging right now has resulted largely atraumatic.  Your pain is most likely musculoskeletal in origin.  It was improved here with some medications.  I do not suspect that you need the C-collar however you indicated that you wished to have for comfort.  Please take Tylenol/Motrin.  Follow up your PCP of choice 72 hours for eval.    We have placed an order to schedule you a close follow-up visit with our [facility] ([facility]). Someone should call you within the next 24 hours to schedule your appointment. We would like you to be seen in the Transitions of Care Clinic in 24-72 hours. Please check your [facility] MyChart for the most up-to-date time for your appointment schedule. If the time of the appointment is within 12 hours of your Emergency Department visit, please call the clinic number to have it rescheduled for 24-72 hours following your ED visit.     If your symptoms significantly worsen prior to your scheduled visit, please return to the Emergency Department for re-evaluation.     We have placed an order to schedule you a close follow-up visit with our [facility] ([facility]). Someone should call you within the next 24 hours to schedule your appointment. We would like you to be seen in the Transitions of Care Clinic in 24-72 hours. Please check your [facility] MyChart for the most up-to-date time for your appointment schedule. If the time of the appointment is within 12 hours of your Emergency Department visit, please call the clinic number to have it rescheduled for 24-72 hours following your ED visit.     If your symptoms significantly worsen prior to your scheduled visit, please return to the Emergency Department for re-evaluation.

## 2022-10-21 NOTE — Care Management Notes (Signed)
[facility]  Care Management Note    Patient Name: [name]  Date of Birth: 03/14/1985  Sex: male  Date/Time of Admission: 10/21/2022  2:08 AM  Room/Bed: ERTR6/ TR6  Payor: BLUE CROSS BLUE SHIELD / Plan: [place] [employer] [employer] PPO / Product Type: PPO /    LOS: 0 days   Primary Care Providers:  Pcp, No (General)    Admitting Diagnosis:  No admission diagnoses are documented for this encounter.    Assessment:      10/21/22 0218   CM Complete   Assessment Type ED   Chart Reviewed Yes     Patient presented to ED s/p fall. GCS 15. Workup in progress. CM will continue to follow.     Discharge Plan:  Undetermined at this time    The patient will continue to be evaluated for developing discharge needs.     Case Manager: Britt Boozer, SOCIAL WORKER  Phone: [phone]

## 2022-10-21 NOTE — ED Nurses Note (Signed)
Patient placed on 2L NC for O2 sats in the 60's-70's while asleep. Patient easy to arouse and O2 now 100%.

## 2022-10-21 NOTE — ED Nurses Note (Signed)
Discharge instructions reviewed with the patient. Patient understood. All questions answered. IV removed with catheter intact. Patient encouraged outpatient follow-up with PCP. Patient discharged from the ED in stable condition. Patient ambulated to waiting room to wait for his girlfriend that is on her way to pick him up.

## 2022-10-21 NOTE — ED Provider Notes (Signed)
This note is incomplete, and any clinical decision making utilizing this note should be made with that understanding, until this phrase is removed***  [facility] - Emergency Department   Resident Provider Note    Name: [name]  Age and Gender: 37 y.o. male  PCP: No Pcp  Attending: Chad Cordial, MD     Triage Note:   Fall (Pt fell hitting the Lt side of his head, -LOC.  Pt refused a back board and c-collar for ems.  Pt given 2.5 mg versed by EMS PTA.  )      Clinical Impression:     Clinical Impression   None    ***    Medical Decision Making     MDM  Course and MDM:  Patient seen and examined. Labs and imaging reviewed.  [name] is a 37 y.o. male presenting to the ED today for *** as described below. Patient arrived afebrile*** and hemodynamically stable***.     {Patient Condition:48049} 37 y.o. male presenting for ***        ***The patient was advised to return to the ED for any new or worsening symptoms. Discharge instructions and return precaution were provided and patient verbalized understanding.***    {EKG Reviewed (Optional):38561}  ECG ***: normal sinus rhythm***, rate of ***, normal axis***, PR ***, QRS ***, QTC ***, no evidence of significant ST elevation or depression***, no hyperacute T-waves noted, T-wave inversion noted in leads***.   *** ***       ED Course as of 10/21/22 0619   Thu Oct 21, 2022   0215 Temperature: 36.6 C (97.9 F)   0215 BP (Non-Invasive)(!): 152/95   0215 Heart Rate: 90   0215 Respiratory Rate: 20   0215 SpO2: 97 %   0231 It was our, nonischemic, elevated T-waves diffusely   0324 CBC/DIFF  wnl   0338 BASIC METABOLIC PANEL(!)  No major electrolyte abnormalities there AKI   0338 ETHANOL, SERUM: <10   0338 MAGNESIUM: 2.1   0338 ECG 12-LEAD  NSR, early agreeable, high T-waves   0339 ECG 12-LEAD   0554 CT CHEST ABDOMEN PELVIS W IV CONTRAST  Beam hardening artifact limits evaluation of the hollow viscus and solid organs. Within these  limitations, no definite acute traumatic injury.   2956 CT BRAIN WO IV CONTRAST  No acute intracranial abnormality.   2130 CT CERVICAL SPINE WO IV CONTRAST  No acute cervical spine fracture or traumatic malalignment.   8657 Re-evaluate the patient at bedside, sleeping comfortably..  Patient with improved pain here.  Still with some left-sided splenius capitis pain.  No pain midline cervical spine.  Patient wishes to keep C-collar on for comfort however did discuss that this was likely not need it.  Patient without any new numbness task tingling/weakness.  Discussed Tylenol/Motrin at home with follow up PCP.  Patient was states that he can follow up tomorrow.  Patient will be ambulated and p.o. challenged           ***  ***    ***Following the above history, physical exam, and studies, the patient was deemed stable and suitable for discharge. The patient was advised to return to the ED for any new or worsening symptoms. Discharge, medication and follow up instructions were discussed with the patient, who verbalized understanding. The patient is comfortable with the plan of care.     ***The patient will be admitted to *** service for further management.    ***  After discussing patient's HPI, ED course and plan, patient was signed out and care transferred to Dr. ***, at *** on 10/21/2022***. Please see their Resident Course Note for further patient care information.       Disposition: Data Unavailable    Follow up:   No follow-up provider specified.  --------------------------------------------------------------------------------------------------------------------------------  History of Present Illness   HPI:  [name] is a 37 y.o. male with PMH of *** who presents to the ED today for ***.      ***     History Limitations: {History Limitations-HF:30643::"None"}      Review of Systems   Review of Systems:  Pertinent ROS as noted in HPI. ***         Historical Data   Below information obtained from chart  review and reviewed with patient:  Past Medical History:   Diagnosis Date   . Allergic rhinitis    . Depression    . Essential hypertension    . Fracture of nasal bones    . Headache    . Nosebleed    . Sinusitis    . Viral hepatitis C          Current Outpatient Medications   Medication Sig   . buprenorphine HCL (SUBUTEX) 2 mg Sublingual Tablet, Sublingual Place 4 Tablets (8 mg total) under the tongue Twice daily     Allergies   Allergen Reactions   . Penicillins      Past Surgical History:   Procedure Laterality Date   . HX HAND SURGERY     . HX ORBITAL SURGERY Left 03/31/2020    Orbital fx repair os         Family History   Problem Relation Age of Onset   . Cancer Mother    . Hypertension (High Blood Pressure) Father    . Cancer Sister    . Anesthesia Complications Neg Hx       No significant family hx other than noted above and no family hx of bleeding disorders or anesthesia problems.  Social History     Occupational History   . Not on file   Tobacco Use   . Smoking status: Every Day     Current packs/day: 0.00     Types: Cigarettes     Last attempt to quit: 03/31/2020     Years since quitting: 2.5     Passive exposure: Current   . Smokeless tobacco: Never   Vaping Use   . Vaping status: Never Used   Substance and Sexual Activity   . Alcohol use: Yes     Comment: denies   . Drug use: Not Currently     Types: Cocaine   . Sexual activity: Not on file          Objective:  Nursing notes reviewed  ED Triage Vitals [10/21/22 0210]   BP (Non-Invasive) (!) 152/95   Heart Rate 90   Respiratory Rate 20   Temperature 36.6 C (97.9 F)   SpO2 97 %   Weight 81.7 kg (180 lb 1.9 oz)   Height 1.753 m (5\' 9" )     Filed Vitals:    10/21/22 0210   BP: (!) 152/95   Pulse: 90   Resp: 20   Temp: 36.6 C (97.9 F)   SpO2: 97%       Physical Exam   Physical Exam***  Physical Exam         Labs:   Labs Ordered/Reviewed - No  data to display    Imaging:  None indicated***  No orders to display        / Leilani Able, DO 10/21/2022, 02:15    PGY-2 Emergency Medicine  [facility]  Pager # [phone] - SPOK Mobile     *Parts of this patients chart were completed in a retrospective fashion due to simultaneous direct patient care activities in the Emergency Department.   *This note was partially generated using MModal Fluency Direct system, and there may be some incorrect words, spellings, and punctuation that were not noted in checking the note before saving.

## 2022-10-21 NOTE — Telephone Encounter (Signed)
Nurse Navigator Coordination order received.  Attempt  1 made to contact patient.  Unable to reach patient.   No Voicemail set up  [name], PATIENT NAVIGATOR, 10/21/2022, 15:21

## 2022-10-21 NOTE — ED Attending Note (Signed)
ED ATTENDING NOTE:    I was physically present and directly supervised this patients care. Patient seen and examined with the resident/MLP, Dr. Derrell Lolling, and history and exam reviewed. Key elements in addition to and/or correction of that documentation are as follows:    HPI:  [name] is a 37 y.o. male who presents after a fall.  Unclear history.      Unsure of where he fell or where EMS picked him up.    States he drank "half a twisted tea."    Complaining of head pain.    Further historical details can be found in the resident/MLP note.    PERTINENT PHYSICAL EXAM:  ED Triage Vitals [10/21/22 0210]   BP (Non-Invasive) (!) 152/95   Heart Rate 90   Respiratory Rate 20   Temperature 36.6 C (97.9 F)   SpO2 97 %   Weight 81.7 kg (180 lb 1.9 oz)   Height 1.753 m (5\' 9" )         I have seen and physically examined the patient.  I agree with the physical exam as documented in Dr. Clair Gulling note.    LABS: Reviewed    IMAGING: Reviewed    ECG: Reviewed      ED COURSE/MEDICAL DECISION MAKING:  Patient remained stable while in the ED.      Patient presented after a fall.  Unclear circumstances around his injury.  Labs and imaging obtained.    CT imaging negative for acute injury.    Patient ambulated.  Was able to eat.    He was then discharged to home.    DISPO: Discharged    CLINICAL IMPRESSION:   Clinical Impression   Fall (Primary)   Head injury         Chad Cordial, MD  10/21/2022, 02:22    Parts of this patients chart were completed in a retrospective fashion due to simultaneous direct patient care activities in the Emergency Department.   This note was partially generated using MModal Fluency Direct system, and there may be some incorrect words, spellings, and punctuation that were not noted in checking the note before saving.

## 2022-10-21 NOTE — Nursing Note (Signed)
Soft admission order received and reviewed by Patient Navigator on 10/21/2022 for [facility]. [facility] [place] for clinic sent? Yes. PCP Referral needed? No  [name], PATIENT NAVIGATOR 07:40   Patient Navigator- Population Health

## 2022-10-21 NOTE — ED Nurses Note (Signed)
Ambulation challenge passed. Patient was able to ambulate independently. Patient provided boxed lunch and sprite.

## 2022-10-21 NOTE — ED Nurses Note (Signed)
Patient now agreeable to c-collar while in CT. C-collar applied at this time.

## 2022-10-21 NOTE — ED Nurses Note (Signed)
Pt taken to ct scan on cardiac monitor with RN.  Pt refusing to lay down because of pain.  Pt requesting to wear a c-collar, c-collar applied.  Pt tolerated well.  Pt able to lay down for ct scan.  Will continue to monitor.

## 2022-10-22 ENCOUNTER — Telehealth (INDEPENDENT_AMBULATORY_CARE_PROVIDER_SITE_OTHER): Payer: Self-pay

## 2022-10-22 ENCOUNTER — Telehealth (HOSPITAL_COMMUNITY): Payer: Self-pay

## 2022-10-22 NOTE — Telephone Encounter (Signed)
Nurse Navigator Coordination order received.  Attempt  2 made to contact patient.  Unable to reach patient.   No Voicemail set up - Will send [employer].   [name], PATIENT NAVIGATOR, 10/22/2022, 09:42

## 2022-10-22 NOTE — Telephone Encounter (Signed)
Post Ed Follow-Up    Post ED Follow-Up:   Document completed and/or attempted interactive contact(s) after transition to home after emergency department stay.:   Transition Facility and relevant Date:   Discharge Date: 10/21/22  Discharge from [facility] Emergency Department?: Yes  Discharge Facility: [facility]  Contacted by: Lum Babe, RN  Contact method: Patient/Caregiver Telephone, MyChart Patient Portal  Contact first attempt: 10/22/2022  2:31 PM  MyChart message sent?: Yes

## 2022-10-26 DIAGNOSIS — R799 Abnormal finding of blood chemistry, unspecified: Secondary | ICD-10-CM | POA: Diagnosis not present

## 2022-10-26 DIAGNOSIS — G4730 Sleep apnea, unspecified: Secondary | ICD-10-CM | POA: Diagnosis not present

## 2022-10-26 DIAGNOSIS — Z6839 Body mass index (BMI) 39.0-39.9, adult: Secondary | ICD-10-CM | POA: Diagnosis not present

## 2022-10-26 DIAGNOSIS — I110 Hypertensive heart disease with heart failure: Secondary | ICD-10-CM | POA: Diagnosis not present

## 2022-10-26 DIAGNOSIS — Z6838 Body mass index (BMI) 38.0-38.9, adult: Secondary | ICD-10-CM | POA: Diagnosis not present

## 2022-10-26 DIAGNOSIS — K2950 Unspecified chronic gastritis without bleeding: Secondary | ICD-10-CM | POA: Diagnosis not present

## 2022-10-27 DIAGNOSIS — K2950 Unspecified chronic gastritis without bleeding: Secondary | ICD-10-CM | POA: Diagnosis not present

## 2022-10-27 DIAGNOSIS — Z6839 Body mass index (BMI) 39.0-39.9, adult: Secondary | ICD-10-CM | POA: Diagnosis not present

## 2022-10-27 DIAGNOSIS — R799 Abnormal finding of blood chemistry, unspecified: Secondary | ICD-10-CM | POA: Diagnosis not present

## 2022-10-28 ENCOUNTER — Telehealth: Payer: Self-pay | Admitting: Internal Medicine

## 2022-10-28 ENCOUNTER — Other Ambulatory Visit: Payer: Self-pay

## 2022-10-28 DIAGNOSIS — E119 Type 2 diabetes mellitus without complications: Secondary | ICD-10-CM

## 2022-10-28 MED ORDER — DEXCOM G7 SENSOR MISC: 1.0000 | Freq: Every day | 1 refills | Status: DC

## 2022-10-28 NOTE — Telephone Encounter (Signed)
RX Refill sent to Dr. [name]

## 2022-10-28 NOTE — Telephone Encounter (Signed)
Prescription Request  10/28/2022  LOV: 09/22/2022  What is the name of the medication or equipment? Dexcom patches  Have you contacted your pharmacy to request a refill? Yes   Which pharmacy would you like this sent to?  [id number] [place], [place] - [address] [place] [address] Phone: [phone] Fax: [phone]     Patient notified that their request is being sent to the clinical staff for review and that they should receive a response within 2 business days.   Please advise at Mobile [phone] (mobile)

## 2022-11-12 ENCOUNTER — Emergency Department
Admission: EM | Admit: 2022-11-12 | Discharge: 2022-11-12 | Disposition: A | Discharge status: Home / Self Care | Payer: BC Managed Care – PPO | Attending: Physician Assistant | Admitting: Physician Assistant

## 2022-11-12 ENCOUNTER — Encounter (HOSPITAL_COMMUNITY): Payer: Self-pay

## 2022-11-12 ENCOUNTER — Other Ambulatory Visit: Payer: Self-pay

## 2022-11-12 DIAGNOSIS — L02214 Cutaneous abscess of groin: Secondary | ICD-10-CM | POA: Insufficient documentation

## 2022-11-12 DIAGNOSIS — F17210 Nicotine dependence, cigarettes, uncomplicated: Secondary | ICD-10-CM | POA: Insufficient documentation

## 2022-11-12 DIAGNOSIS — L0291 Cutaneous abscess, unspecified: Secondary | ICD-10-CM

## 2022-11-12 MED ORDER — CLINDAMYCIN HCL 150 MG CAPSULE
450.0000 mg | ORAL_CAPSULE | Freq: Three times a day (TID) | ORAL | 0 refills | Status: AC
Start: 2022-11-12 — End: 2022-11-19

## 2022-11-12 MED ORDER — CEPHALEXIN 500 MG CAPSULE
500.0000 mg | ORAL_CAPSULE | Freq: Three times a day (TID) | ORAL | 0 refills | Status: DC
Start: 2022-11-12 — End: 2022-11-12

## 2022-11-12 MED ORDER — PREDNISONE 10 MG TABLET
ORAL_TABLET | ORAL | 0 refills | Status: AC
Start: 2022-11-12 — End: 2022-11-30

## 2022-11-12 MED ORDER — SULFAMETHOXAZOLE 800 MG-TRIMETHOPRIM 160 MG TABLET
1.0000 | ORAL_TABLET | Freq: Two times a day (BID) | ORAL | 0 refills | Status: DC
Start: 2022-11-12 — End: 2022-11-12

## 2022-11-12 NOTE — ED Triage Notes (Signed)
Abscess right groin area... pain left middle finger since yesterday  Iv drug use

## 2022-11-12 NOTE — ED Provider Notes (Signed)
[facility]           Name: [name]  Age and Gender: 37 y.o. male  PCP: No Pcp    Chief Complaint:  Patient presents with     Chief Complaint   Patient presents with    Abscess       HPI    HPI     [name], date of birth 28-Feb-1985, is a 37 y.o. male who presents to the Emergency Department with a chief complaint of abscess to right groin for 2 days. He also c/o left finger pain. He states he has noted mild drainage at home. Denies fever/chills. No n/v. He relapsed with IV meth. He denies injecting in his groin or finger. Denies h/o MRSA. He also c/o left 3rd finger pain. He states he has a h/o this from using IV drugs in the past.     Other than noted above, review of systems obtained and negative.      History reviewed This Encounter: Medical History  Surgical History  Family History  Social History    Past Medical History:  Diagnosis     Past Medical History:   Diagnosis Date    Allergic rhinitis     Depression     Essential hypertension     Fracture of nasal bones     Headache     Nosebleed     Sinusitis     Viral hepatitis C        Past Surgical History:  Past Surgical History:   Procedure Laterality Date    Hx hand surgery      Hx orbital surgery Left 03/31/2020       Family History:   Family History   Problem Relation Age of Onset    Cancer Mother     Hypertension (High Blood Pressure) Father     Cancer Sister     Anesthesia Complications Neg Hx        Social History     Social History     Tobacco Use    Smoking status: Every Day     Current packs/day: 0.00     Types: Cigarettes     Last attempt to quit: 03/31/2020     Years since quitting: 2.6     Passive exposure: Current    Smokeless tobacco: Never   Vaping Use    Vaping status: Never Used   Substance Use Topics    Alcohol use: Yes     Comment: social    Drug use: Not Currently     Types: Cocaine, IV     Comment: no current drung use       Social History     Substance and Sexual Activity   Drug Use Not Currently     Types: Cocaine, IV    Comment: no current drung use       No Pcp    Allergies   Allergen Reactions    Penicillins        Outpatient Medications Marked as Taking for the 11/12/22 encounter [facility] Encounter)   Medication Sig    clindamycin (CLEOCIN) 150 mg Oral Capsule Take 3 Capsules (450 mg total) by mouth Three times a day for 7 days    predniSONE (DELTASONE) 10 mg Oral Tablet Take 5 Tablets (50 mg total) by mouth Once a day for 3 days, THEN 4 Tablets (40 mg total) Once a day for 3 days, THEN 3 Tablets (30 mg total) Once  a day for 3 days, THEN 2 Tablets (20 mg total) Once a day for 3 days, THEN 1 Tablet (10 mg total) Once a day for 3 days, THEN 0.5 Tablets (5 mg total) Once a day for 3 days.       PE:   ED Triage Vitals   BP (Non-Invasive) 11/12/22 1458 (!) 152/104   Heart Rate 11/12/22 1458 (!) 101   Respiratory Rate 11/12/22 1458 14   Temperature 11/12/22 1458 36.8 C (98.2 F)   SpO2 11/12/22 1458 98 %   Weight 11/12/22 1456 80.3 kg (177 lb)   Height 11/12/22 1456 1.753 m (5\' 9" )     Physical Exam    Constitutional: Pt is well-developed and well-nourished caucasian male.  Nontoxic appearing.  Walked to the room.  Head: Normocephalic and atraumatic.   Eyes: Conjunctivae are normal. EOM are intact  Neck: Soft, supple, full range of motion.  No tenderness.  Musculoskeletal: Normal range of motion. No deformities.  Moderate ttp of the left distal 3rd digit. Normal radial pulse and capillary refill. Small cracks noted to skin of finger.    Neurological: CNs 2-12 grossly intact.  No focal deficits noted. ZOX09  Skin: Warm and dry.  Approximally 3 cm x 2 cm induration of the right groin noted.  No drainage noted.  Psychiatric: Patient has a normal mood and affect.     DDx:  Differential diagnosis includes, but is not limited to abscess, cellulitis, IV drug use, tendinitis, tenosynovitis    Initial workup:      Orders:  Orders Placed This Encounter    FOLLOW-UP: PRIMARY CARE - [facility] [place]     clindamycin (CLEOCIN) 150 mg Oral Capsule    predniSONE (DELTASONE) 10 mg Oral Tablet           Diagnostics:  I have personally reviewed all laboratory and imaging results for this patient.  Results are as listed below.  Labs:  No results found for this or any previous visit (from the past 12 hour(s)).  Labs reviewed and interpreted by me.    Radiology:    No orders to display       EKG:  No results found for this visit on 11/12/22 (from the past 720 hour(s)).    Medical Decision Making  This is a 37 year old male who presents with abscess to right groin and finger pain.   I did recommend I and D of the abscess but he refused.  He states that when he had a previous infection of his finger he was prescribed clindamycin.  Therefore I did prescribe this for both his finger and abscess of his groin.  I did instruct him to take a probiotic with this which she understands and agrees to.  He also understands and agrees to follow-up with PCP and will return if any new or worsening symptoms.    Risk  Prescription drug management.        ED Course:    During the patient's stay in the emergency department, the above listed imaging and/or labs were performed to assist with medical decision making and were reviewed by myself when available for review.   Relevant prior external notes and tests were reviewed by myself if available.  Patient rechecked and remained stable throughout remainder of emergency department course.   All questions/concerns addressed, and patient agrees with disposition plan.           Medications given during ED stay include:  Current Discharge Medication List        START taking these medications    Details   clindamycin (CLEOCIN) 150 mg Oral Capsule Take 3 Capsules (450 mg total) by mouth Three times a day for 7 days  Qty: 63 Capsule, Refills: 0      predniSONE (DELTASONE) 10 mg Oral Tablet Take 5 Tablets (50 mg total) by mouth Once a day for 3 days, THEN 4 Tablets (40 mg total) Once a day for 3  days, THEN 3 Tablets (30 mg total) Once a day for 3 days, THEN 2 Tablets (20 mg total) Once a day for 3 days, THEN 1 Tablet (10 mg total) Once a day for 3 days, THEN 0.5 Tablets (5 mg total) Once a day for 3 days.  Qty: 46.5 Tablet, Refills: 0             Clinical Impression:   Clinical Impression   Abscess (Primary)       Disposition: Discharged        // [name] PA-C 11/12/2022, 15:14   [facility], [facility] Emergency Department    Parts of this chart were completed in a retrospective fashion due to simultaneous patient care activities in the Emergency Department.

## 2022-11-13 ENCOUNTER — Emergency Department (HOSPITAL_COMMUNITY): Payer: MEDICAID

## 2022-11-13 ENCOUNTER — Encounter (HOSPITAL_COMMUNITY): Payer: Self-pay

## 2022-11-13 ENCOUNTER — Emergency Department (HOSPITAL_COMMUNITY): Payer: BC Managed Care – PPO

## 2022-11-13 ENCOUNTER — Emergency Department
Admission: EM | Admit: 2022-11-13 | Discharge: 2022-11-13 | Disposition: A | Discharge status: Other Acute Inpatient Hospital | Payer: BC Managed Care – PPO | Attending: EMERGENCY MEDICINE | Admitting: EMERGENCY MEDICINE

## 2022-11-13 ENCOUNTER — Emergency Department
Admission: EM | Admit: 2022-11-13 | Discharge: 2022-11-13 | Disposition: A | Discharge status: Home / Self Care | Payer: MEDICAID | Source: Other Acute Inpatient Hospital | Attending: Emergency Medicine | Admitting: Emergency Medicine

## 2022-11-13 ENCOUNTER — Other Ambulatory Visit: Payer: Self-pay

## 2022-11-13 DIAGNOSIS — L03012 Cellulitis of left finger: Secondary | ICD-10-CM

## 2022-11-13 DIAGNOSIS — M65942 Unspecified synovitis and tenosynovitis, left hand: Secondary | ICD-10-CM

## 2022-11-13 DIAGNOSIS — S61203A Unspecified open wound of left middle finger without damage to nail, initial encounter: Secondary | ICD-10-CM | POA: Insufficient documentation

## 2022-11-13 DIAGNOSIS — F1990 Other psychoactive substance use, unspecified, uncomplicated: Secondary | ICD-10-CM

## 2022-11-13 DIAGNOSIS — M79645 Pain in left finger(s): Secondary | ICD-10-CM

## 2022-11-13 DIAGNOSIS — R600 Localized edema: Secondary | ICD-10-CM | POA: Insufficient documentation

## 2022-11-13 DIAGNOSIS — Z7289 Other problems related to lifestyle: Secondary | ICD-10-CM | POA: Insufficient documentation

## 2022-11-13 DIAGNOSIS — M7989 Other specified soft tissue disorders: Secondary | ICD-10-CM

## 2022-11-13 DIAGNOSIS — L089 Local infection of the skin and subcutaneous tissue, unspecified: Secondary | ICD-10-CM | POA: Insufficient documentation

## 2022-11-13 DIAGNOSIS — L02214 Cutaneous abscess of groin: Secondary | ICD-10-CM | POA: Insufficient documentation

## 2022-11-13 DIAGNOSIS — R2232 Localized swelling, mass and lump, left upper limb: Secondary | ICD-10-CM

## 2022-11-13 DIAGNOSIS — F1510 Other stimulant abuse, uncomplicated: Secondary | ICD-10-CM | POA: Insufficient documentation

## 2022-11-13 DIAGNOSIS — F17210 Nicotine dependence, cigarettes, uncomplicated: Secondary | ICD-10-CM

## 2022-11-13 LAB — HIV1/HIV2 SCREEN, COMBINED ANTIGEN AND ANTIBODY: HIV SCREEN, COMBINED ANTIGEN & ANTIBODY: NEGATIVE

## 2022-11-13 LAB — CBC WITH DIFF
BASOPHIL #: <0.1 10*3/uL (ref <=0.20)
BASOPHIL %: 0.1 %
EOSINOPHIL #: <0.1 10*3/uL (ref <=0.50)
EOSINOPHIL %: 0 %
HCT: 44.1 % (ref 38.9–52.0)
HGB: 15.2 g/dL (ref 13.4–17.5)
IMMATURE GRANULOCYTE #: <0.1 10*3/uL (ref <0.10)
IMMATURE GRANULOCYTE %: 0.2 % (ref 0.0–1.0)
LYMPHOCYTE #: 1.04 10*3/uL (ref 1.00–4.80)
LYMPHOCYTE %: 10.9 %
MCH: 28.5 pg (ref 26.0–32.0)
MCHC: 34.5 g/dL (ref 31.0–35.5)
MCV: 82.7 fL (ref 78.0–100.0)
MONOCYTE #: 0.72 10*3/uL (ref 0.20–1.10)
MONOCYTE %: 7.5 %
MPV: 10.3 fL (ref 8.7–12.5)
NEUTROPHIL #: 7.79 10*3/uL — ABNORMAL HIGH (ref 1.50–7.70)
NEUTROPHIL %: 81.3 %
PLATELETS: 288 10*3/uL (ref 150–400)
RBC: 5.33 10*6/uL (ref 4.50–6.10)
RDW-CV: 11.9 % (ref 11.5–15.5)
WBC: 9.6 10*3/uL (ref 3.7–11.0)

## 2022-11-13 LAB — LACTIC ACID LEVEL W/ REFLEX FOR LEVEL >2.0: LACTIC ACID: 2.5 mmol/L — ABNORMAL HIGH (ref 0.5–2.2)

## 2022-11-13 LAB — C-REACTIVE PROTEIN(CRP),INFLAMMATION: CRP INFLAMMATION: 22 mg/L — ABNORMAL HIGH (ref <8.0)

## 2022-11-13 LAB — BLUE TOP TUBE

## 2022-11-13 LAB — LIGHT GREEN TOP TUBE

## 2022-11-13 LAB — BASIC METABOLIC PANEL
ANION GAP: 12 mmol/L (ref 4–13)
BUN/CREA RATIO: 10 (ref 6–22)
BUN: 10 mg/dL (ref 8–25)
CALCIUM: 9.6 mg/dL (ref 8.6–10.2)
CHLORIDE: 109 mmol/L (ref 96–111)
CO2 TOTAL: 19 mmol/L — ABNORMAL LOW (ref 22–30)
CREATININE: 1.02 mg/dL (ref 0.75–1.35)
ESTIMATED GFR - MALE: >90 mL/min/BSA (ref >=60)
GLUCOSE: 120 mg/dL (ref 65–125)
POTASSIUM: 4.1 mmol/L (ref 3.5–5.1)
SODIUM: 140 mmol/L (ref 136–145)

## 2022-11-13 LAB — LAVENDER TOP TUBE

## 2022-11-13 LAB — SEDIMENTATION RATE: ERYTHROCYTE SEDIMENTATION RATE (ESR): 10 mm/h (ref 0–10)

## 2022-11-13 LAB — GOLD TOP TUBE

## 2022-11-13 LAB — RED TOP TUBE

## 2022-11-13 MED ORDER — BACITRACIN 500 UNIT/G OINTMENT TUBE
TOPICAL_OINTMENT | Freq: Two times a day (BID) | CUTANEOUS | 0 refills | Status: DC | PRN
Start: 2022-11-13 — End: 2023-08-23
  Filled 2022-11-13: qty 28.4, 7d supply, fill #0

## 2022-11-13 MED ORDER — IOPAMIDOL 370 MG IODINE/ML (76 %) INTRAVENOUS SOLUTION
100.0000 mL | INTRAVENOUS | Status: AC
Start: 2022-11-13 — End: 2022-11-13
  Administered 2022-11-13: 100 mL via INTRAVENOUS

## 2022-11-13 MED ORDER — MORPHINE 4 MG/ML INTRAVENOUS SOLUTION
4.0000 mg | INTRAVENOUS | Status: AC
Start: 2022-11-13 — End: 2022-11-13
  Administered 2022-11-13: 4 mg via INTRAVENOUS
  Filled 2022-11-13: qty 1

## 2022-11-13 MED ORDER — ACETAMINOPHEN 325 MG TABLET
975.0000 mg | ORAL_TABLET | ORAL | Status: AC
Start: 2022-11-13 — End: 2022-11-13
  Administered 2022-11-13: 975 mg via ORAL
  Filled 2022-11-13: qty 3

## 2022-11-13 MED ORDER — VANCOMYCIN 1,000 MG INTRAVENOUS INJECTION
20.0000 mg/kg | INTRAVENOUS | Status: AC
Start: 2022-11-13 — End: 2022-11-13
  Administered 2022-11-13: 1500 mg via INTRAVENOUS
  Filled 2022-11-13: qty 15

## 2022-11-13 MED ORDER — KETOROLAC 30 MG/ML (1 ML) INJECTION SOLUTION
15.0000 mg | INTRAMUSCULAR | Status: AC
Start: 2022-11-13 — End: 2022-11-13
  Administered 2022-11-13: 15 mg via INTRAVENOUS
  Filled 2022-11-13: qty 1

## 2022-11-13 MED ORDER — SODIUM CHLORIDE 0.9 % INTRAVENOUS PIGGYBACK
2.0000 g | INTRAVENOUS | Status: AC
Start: 2022-11-13 — End: 2022-11-13
  Administered 2022-11-13: 2 g via INTRAVENOUS
  Administered 2022-11-13: 0 g via INTRAVENOUS
  Filled 2022-11-13: qty 20

## 2022-11-13 MED ORDER — MORPHINE 4 MG/ML INTRAVENOUS SOLUTION
8.0000 mg | INTRAVENOUS | Status: AC
Start: 2022-11-13 — End: 2022-11-13
  Administered 2022-11-13: 8 mg via INTRAVENOUS
  Filled 2022-11-13: qty 2

## 2022-11-13 MED ORDER — MORPHINE 4 MG/ML INJECTION SYRINGE
4.0000 mg | INJECTION | INTRAMUSCULAR | Status: AC
Start: 2022-11-13 — End: 2022-11-13
  Administered 2022-11-13: 4 mg via INTRAVENOUS
  Filled 2022-11-13: qty 1

## 2022-11-13 MED ORDER — LIDOCAINE HCL 10 MG/ML (1 %) INJECTION SOLUTION
10.0000 mL | INTRAMUSCULAR | Status: DC
Start: 2022-11-13 — End: 2022-11-13

## 2022-11-13 MED ORDER — MORPHINE 10 MG/ML INTRAVENOUS SOLUTION
6.0000 mg | INTRAVENOUS | Status: AC
Start: 2022-11-13 — End: 2022-11-13
  Administered 2022-11-13: 6 mg via INTRAVENOUS
  Filled 2022-11-13: qty 1

## 2022-11-13 MED ORDER — GADOPICLENOL 0.5 MMOL/ML INTRAVENOUS SOLUTION
8.0000 mL | INTRAVENOUS | Status: AC
Start: 2022-11-13 — End: 2022-11-13
  Administered 2022-11-13: 8 mL via INTRAVENOUS

## 2022-11-13 MED ORDER — ONDANSETRON HCL (PF) 4 MG/2 ML INJECTION SOLUTION
4.0000 mg | INTRAMUSCULAR | Status: AC
Start: 2022-11-13 — End: 2022-11-13
  Administered 2022-11-13: 4 mg via INTRAVENOUS
  Filled 2022-11-13: qty 2

## 2022-11-13 NOTE — ED Nurses Note (Signed)
Report called to Delilah at [facility] ER

## 2022-11-13 NOTE — ED Nurses Note (Signed)
Report received from previous RN. Transfer of care at this time.

## 2022-11-13 NOTE — ED Provider Notes (Addendum)
[facility] - Emergency Department    MDM:    37 year old male presents with left 3rd finger pain in the setting of recent IV drug use to that extremity.  Significant concern for flexor tenosynovitis.  Started on broad-spectrum antibiotics (vancomycin and Ceftin due to penicillin allergy), will obtain imaging.  May need transferred for hand evaluation.    ED Course as of 11/13/22 0252   Sat Nov 13, 2022   0150 WBC: 9.6   0154 SEDIMENTATION RATE: 10   0208 C-REACTIVE PROTEIN (CRP),INFLAMMATION(!): 22.0   0213 LACTIC ACID(!): 2.5   0230 XR HAND LEFT  IMPRESSION:  Soft tissue edema without fracture, dislocation, or acute osseous abnormality of the left hand. If clinical concern persists, recommend short-term reimaging clinical follow-up.     0231 CT HAND LEFT W/WO IV CONTRAST  IMPRESSION:     1. Soft tissue edema present within the second and third digits. No focal fluid collection identified. No soft tissue gas. No acute osseous abnormality.        0234 Due to concern for flexor tenosynovitis, will transfer for hand eval. Spoke with Dr. Elige Radon of ortho who agrees with transfer.    8295 Patient accepted to [facility], ED to ED, Dr. Cleone Slim.       Medical Decision Making  Problems Addressed:  Finger infection: acute illness or injury    Amount and/or Complexity of Data Reviewed  External Data Reviewed: notes.     Details: Previous ED visit  Labs: ordered. Decision-making details documented in ED Course.  Radiology: ordered and independent interpretation performed. Decision-making details documented in ED Course.  ECG/medicine tests: ordered and independent interpretation performed. Decision-making details documented in ED Course.    Risk  Prescription drug management.  Parenteral controlled substances.  Decision regarding hospitalization.      Impression/Dispo:    Clinical Impression:   Clinical Impression   Finger infection (Primary)       Disposition: Transfered to Another Facility        Medications given during  ED stay include:  Medications Administered in the ED   vancomycin (VANCOCIN) 1,500 mg in NS 500 mL IVPB (1,500 mg Intravenous New Bag/New Syringe 11/13/22 0211)   morphine 10 mg/mL injection (has no administration in time range)   morphine 4 mg/mL injection (4 mg Intravenous Given 11/13/22 0140)   ondansetron (ZOFRAN) 2 mg/mL injection (4 mg Intravenous Given 11/13/22 0141)   ketorolac (TORADOL) 30 mg/mL injection (15 mg Intravenous Given 11/13/22 0142)   cefOXitin (MEFOXIN) 2 g in NS 100 mL IVPB minibag (0 g Intravenous Stopped 11/13/22 0200)   iopamidol (ISOVUE-370) 76% infusion (100 mL Intravenous Given 11/13/22 0207)   morphine 10 mg/mL injection (6 mg Intravenous Given 11/13/22 0219)     HPI:  [name], date of birth 12-05-1985, is a 37 y.o. male who presents with a CC of Finger Pain (Left hand, 3rd finger).     The patient arrived by Car.    Pain to his left 3rd digit.  He is right-hand dominant.  It has been draining.  Was seen here earlier and given clindamycin for his finger as well as an abscess on his groin.  He has used IV drugs (meth) in his left upper extremity within the past couple weeks. No fever.    History Reviewed This Encounter: Medical History  Surgical History  Family History  Social History     EXAM:    ED Triage Vitals [11/13/22 0114]   BP (Non-Invasive) Marland Kitchen)  166/92   Heart Rate (!) 103   Respiratory Rate 20   Temperature 36.5 C (97.7 F)   SpO2 98 %   Weight    Height      Physical Exam  Constitutional:       Appearance: He is not ill-appearing.   HENT:      Head: Normocephalic.   Cardiovascular:      Rate and Rhythm: Normal rate.      Pulses: Normal pulses.      Comments: 1+ radial pulse on L  Pulmonary:      Effort: Pulmonary effort is normal. No respiratory distress.   Musculoskeletal:      Comments: LUE: 3rd digit is held in flexion with extreme pain with movement. Appears mildly swollen. Hard to tell if erythematous as hands are very dirty and pt in too much pain to clean them.  SILT.   Skin:     General: Skin is warm.      Capillary Refill: Capillary refill takes less than 2 seconds.   Neurological:      General: No focal deficit present.      Mental Status: He is alert.           DIAGNOSTICS:  Labs, radiology images, and EKG if obtained were reviewed by me.    Labs:    Labs Ordered/Reviewed   LACTIC ACID LEVEL W/ REFLEX FOR LEVEL >2.0 - Abnormal; Notable for the following components:       Result Value    LACTIC ACID 2.5 (*)     All other components within normal limits   BASIC METABOLIC PANEL - Abnormal; Notable for the following components:    CO2 TOTAL 19 (*)     All other components within normal limits   C-REACTIVE PROTEIN(CRP),INFLAMMATION - Abnormal; Notable for the following components:    CRP INFLAMMATION 22.0 (*)     All other components within normal limits   CBC WITH DIFF - Abnormal; Notable for the following components:    NEUTROPHIL # 7.79 (*)     All other components within normal limits   SEDIMENTATION RATE - Normal   ADULT ROUTINE BLOOD CULTURE, SET OF 2 BOTTLES (BACTERIA AND YEAST)   ADULT ROUTINE BLOOD CULTURE, SET OF 2 BOTTLES (BACTERIA AND YEAST)   CBC/DIFF    Narrative:     The following orders were created for panel order CBC/DIFF.  Procedure                               Abnormality         Status                     ---------                               -----------         ------                     CBC WITH ZOXW[[id number]]                Abnormal            Final result                 Please view results for these tests on the individual orders.   RAINBOW DRAW - [facility]  ONLY    Narrative:     The following orders were created for panel order RAINBOW DRAW - [facility] ONLY.  Procedure                               Abnormality         Status                     ---------                               -----------         ------                     BLUE TOP ZOXW[[id number]]                                    In process                 RED TOP JXBJ[[id number]]                                      In process                   Please view results for these tests on the individual orders.   BLUE TOP TUBE   RED TOP TUBE   LACTIC ACID - FIRST REFLEX       Radiology:   CT HAND LEFT W/WO IV CONTRAST   Final Result by Edi, Radresults In (11/09 0230)      1. Soft tissue edema present within the second and third digits. No focal fluid collection identified. No soft tissue gas. No acute osseous abnormality.         Radiologist location ID: [id number]         XR HAND LEFT   Final Result by Edi, Radresults In (11/09 0226)   Soft tissue edema without fracture, dislocation, or acute osseous abnormality of the left hand. If clinical concern persists, recommend short-term reimaging clinical follow-up.               Radiologist location ID: [id number]               // Darrick Huntsman, MD 11/13/2022 01:18  Department of Emergency Medicine  [facility]    Parts of this patients chart were completed in a retrospective fashion due to simultaneous direct patient care activities in the Emergency Department. This note was partially generated using MModal Fluency Direct System, and there may be some incorrect words, spellings, and punctuation that were not noted in proofreading the note prior to saving.

## 2022-11-13 NOTE — ED Nurses Note (Signed)
Consult MD at bedside.

## 2022-11-13 NOTE — Consults (Signed)
[facility]  Department of Surgery  Division of Plastic, Reconstructive and Hand Surgery  H&P / Consult Note  Date of Service: 11/13/2022    Patient: [name]  MRN: [id number]  DOB: 07/01/1985    Admission Date: 11/13/2022  Plastics Staff: Nils Flack, MD  Requesting Service/Staff: ED    Consult Reason/Chief Complaint:   Concern for flexor tenosynovitis    HPI:   KELDRIC SLAGHT is a right-hand dominant 37 y.o. male who presents with left long finger pain s/p no known injury to the finger. He states he first noticed pain in the tip of his finger on 11/11/22. States the tip of L long finger was very swollen and discolored to a black color. Late on 11/12/22 states the tip broke open and decompressed what appeared to be blood. Continiued pain and swelling.  No numbness or paresthesias of left long finger.  Denies pain or injury to other extremities. Transferred from [facility].  XR/CT imaging revealed soft tissue edema in the second and third digit but all pain is localized to 3rd digit.  Pt has received Vanc and cefoxitin for antibacterial coverage.  Tetanus given in ED.    Pt denies any recent fever, chills, CP, SOB, N/V/D, change in urinary or bowel habits, or any other symptoms or complaints.    Of note, pt has a h/o subutex use and recent use of IV meth. States this was first use in a long time and he does not regularly use recreational drugs.  No prior injury to left hand, and no prior hand surgery.  Pt works as a Corporate investment banker.    ROS:   Per HPI, otherwise negative    PMH:  Past Medical History:   Diagnosis Date    Allergic rhinitis     Depression     Essential hypertension     Fracture of nasal bones     Headache     Nosebleed     Sinusitis     Viral hepatitis C          There is no immunization history on file for this patient.    PSH:  Past Surgical History:   Procedure Laterality Date    HX HAND SURGERY      HX ORBITAL SURGERY Left 03/31/2020    Orbital fx repair os       - Orbital fx  repair        ALLERGIES:  Allergies   Allergen Reactions    Penicillins      - penicillin  - No h/o reactions with anaesthesia    HOME MEDICATIONS:  Prior to Admission Medications   Prescriptions Last Dose Informant Patient Reported? Taking?   acetaminophen (TYLENOL) 500 mg Oral Tablet   No No   Sig: Take 2 Tablets (1,000 mg total) by mouth Every 6 hours as needed for Pain for up to 7 days   buprenorphine HCL (SUBUTEX) 2 mg Sublingual Tablet, Sublingual   Yes No   Sig: Place 4 Tablets (8 mg total) under the tongue Twice daily   clindamycin (CLEOCIN) 150 mg Oral Capsule   No No   Sig: Take 3 Capsules (450 mg total) by mouth Three times a day for 7 days   predniSONE (DELTASONE) 10 mg Oral Tablet   No No   Sig: Take 5 Tablets (50 mg total) by mouth Once a day for 3 days, THEN 4 Tablets (40 mg total) Once a day for 3 days, THEN 3 Tablets (30  mg total) Once a day for 3 days, THEN 2 Tablets (20 mg total) Once a day for 3 days, THEN 1 Tablet (10 mg total) Once a day for 3 days, THEN 0.5 Tablets (5 mg total) Once a day for 3 days.      Facility-Administered Medications: None     - Anticoagulation = denies  - Antiplatelets = denies    SH:   Social History     Tobacco Use    Smoking status: Every Day     Current packs/day: 0.00     Types: Cigarettes     Last attempt to quit: 03/31/2020     Years since quitting: 2.6     Passive exposure: Current    Smokeless tobacco: Never   Substance Use Topics    Alcohol use: Yes     Comment: social     - Occupation Scientist, physiological      FH:  Family Medical History:       Problem Relation (Age of Onset)    Cancer Mother, Sister    Hypertension (High Blood Pressure) Father            - No family h/o anesthesia problems per patient's knowledge  - No family h/o DVTs or bleeding disorders per patient's knowledge     PHYSICAL EXAM:          Recent vitals:    BP (!) 155/91   Pulse (!) 106   Temp 36.8 C (98.3 F)   Resp 18   Ht 1.753 m (5\' 9" )   Wt 80.3 kg (177 lb)   SpO2 100%   BMI 26.14  kg/m         Gen: Well-appearing, some distress  Msk: Other than LUE, no deformities with normal strength, sensation and pulses in all extremities  EXTREMITY= LUE  - Deformity: No rotational deformity   - Skin / Wounds:  Swelling at the tip of the left long finger.  Area of skin opening at the tip of the left long finger appears consistent patient's report of a decompression of fluid.  No current pus drainage, no erythema.  Dirt globally over her hands   - Pain:  Exquisitely tender at the left long finger distal phalanx, no tenderness to palpation along the left long finger flexor tendon, some tenderness over the dorsal aspect of the left long finger middle phalanx, otherwise no tenderness to palpation in the hand, no pain with active range of motion of joints other than in the left long finger.  - Sensory: SILT throughout (U/M/R nerve distributions)  - Motor: Intact (+AIN/PIN/U incl. ok sign, thumbs up, finger cross, finger spread)  - Finger flexors/extensors: Intact FDP, FDS, extensors all digits, unable to flex the tip of the left long finger secondary to pain  - Vascular: Palpable pulses (2+ R)    Pictures:            (11/13/2022 - 04:24)    IMAGING:   CT demonstrated some soft tissue edema of the index and long finger (left).    Pertinent Labs:   I have reviewed all current labs    Lab Results   Component Value Date    WBC 9.6 11/13/2022    HGB 15.2 11/13/2022    HGB 14.3 10/21/2022    HGB 14.1 02/24/2022    HCT 44.1 11/13/2022    PLTCNT 288 11/13/2022    INR 0.99 01/03/2020    CREAPROINFLA 22.0 (H) 11/13/2022    CREAPROINFLA 2.4  02/24/2022    CREAPROINFLA 2.0 10/19/2018    TROPONINI <7 05/20/2017       Recent Labs     11/13/22  0135   WBC 9.6   HGB 15.2   SODIUM 140   POTASSIUM 4.1   CREATININE 1.02     Recent Labs     11/13/22  0135   BUN 10     Recent Labs     11/13/22  0135   CALCIUM 9.6       Microbiology:  No results found for any visits on 11/13/22 (from the past 96 hour(s)).    ASSESSMENT:  37 y.o. male  with left long finger pain concerning for felon vs soft tissue infection.Marland Kitchen      PLAN/RECOMMENDATIONS:   -Ordered MRI left hand to evaluate infection further, will follow up results and plan based upon imaging.  - Thank you for the consult  - Please place "Plastic Surgery Consult" in Epic  - Call or page PRS service if questions    Thedore Mins MD  Resident  Department of Orthopaedic Surgery  11/13/2022 04:24      MRI resulted, no apparent foreign bodies or fluid collections. Discussed results with patient, he states he has been able to express "most of the fluid" out of his finger. Offered I&D of his felon, patient declined at this time. Discussed possibility of recurrence of the infection, and advised to return to the ED if symptoms recur. Recommend wound care with bacitracin, Koban wrap twice a day. He has several existing antibiotic prescriptions. Advised to take Bactrim and Clindamycin with a probiotic. Will follow up in 1 week for wound check at [facility] hand clinic with Marcelle Smiling.     Kristen Cardinal, MD    Plastic Surgery Resident, PGY5  Pager [phone]     Attending Attestation  I reviewed the medical record, personally saw and examined the patient. I performed a history/physical exam and personally reviewed the left hand MRI, CT and x-rays which were correlated clinically with the physical examination of the affected area revealing changes consistent with a felon. The patient stated that just before the MRI was performed, the felon spontaneously drained and that since that event the finger tip pain and swelling had resolved. He refused to have a formal I&D of the felon even after having a long discussion during the I&D informed consent process. I agree with resident documentation. Bacitracin ointment and a Coban wrap were therefore applied to the involved finger while he was in the E.R. See the above note for additional details.     Azalia Bilis, MD

## 2022-11-13 NOTE — ED Attending Note (Signed)
Chief Complaint   Patient presents with    Finger Pain     Transfer from [facility] for concern of left 3rd digit finger flexor tenosynovitis seconardy to IV amphetamine use       Patient presents with finger pain. Patient reports pain to his left middle finger.  He denies any known injury to the pain, but has been working on a house recently.  He reports swelling to his finger and states he did have some blood expressed from the finger today.  He denies any fever.  He does report a history of IV drug use.  Per chart review, patient evaluated at outside facility, where he was given antibiotics and transferred to this facility due to concern for flexor tenosynovitis.      Patient does not appear to be in any acute distress on physical exam.  He does have some swelling noted to his left middle finger.  He is holding this finger in a flexed position and has difficulty extending the finger secondary to pain and swelling.    Patient given Tylenol morphine in ED.  Given patient's findings, plastic surgery consulted for further evaluation.  They recommend obtaining MRI imaging of patient's finger.  This was ordered.  Plan at this time is per Plastic surgery recommendations.  Patient care transferred to Dr. Rhona Raider with this plan.  Dr. Rhona Raider updated on plan.        Medical Decision Making  Problems Addressed:  Finger pain, left: acute illness or injury    Amount and/or Complexity of Data Reviewed  Radiology: ordered.    Risk  OTC drugs.  Parenteral controlled substances.          Jerl Mina, MD

## 2022-11-13 NOTE — ED Provider Notes (Signed)
[facility] - Emergency Department   Resident Provider Note  This note is incomplete, and any clinical decision making utilizing this note should be made with that understanding, until this phrase is removed***    Name: [name]  Age and Gender: 37 y.o. male  MRN: [id number]  PCP: Peter Minium, MD  Attending: No att. providers found  --------------------------------------------------------------------------------------------------------------------------------  Triage Note:   Finger Pain (Transfer from [facility] for concern of left 3rd digit finger flexor tenosynovitis seconardy to IV amphetamine use)    --------------------------------------------------------------------------------------------------------------------------------  History of Present Illness      HPI:    [name] is a 37 y.o. male  who presents to the ED today for ***  PMH ***  ***  Patient denies ***        --------------------------------------------------------------------------------------------------------------------------------  Physical Exam      Objective:  Nursing notes reviewed  ED Triage Vitals [11/13/22 0408]   BP (Non-Invasive) (!) 155/91   Heart Rate (!) 106   Respiratory Rate 18   Temperature 36.8 C (98.3 F)   SpO2 100 %   Weight 80.3 kg (177 lb)   Height 1.753 m (5\' 9" )     Physical Exam        --------------------------------------------------------------------------------------------------------------------------------  Patient Data      Labs:   Labs Ordered/Reviewed   HIV1/HIV2 SCREEN, COMBINED ANTIGEN AND ANTIBODY - Normal   BLUE TOP TUBE   EXTRA TUBES    Narrative:     The following orders were created for panel order EXTRA TUBES.  Procedure                               Abnormality         Status                     ---------                               -----------         ------                     [name] TOP OZHY[[id number]]                                    Final  result               GOLD TOP EXBM[[id number]]                                    In process                 LIGHT GREEN TOP UUVO[[id number]]                             In process                 LAVENDER TOP VQQV[[id number]]                                In process  LAVENDER TOP JXBJ[[id number]]                                In process                   Please view results for these tests on the individual orders.   GOLD TOP TUBE   LIGHT GREEN TOP TUBE   LAVENDER TOP TUBE   LAVENDER TOP TUBE       Imaging:  None ordered***  Any imaging performed following the conclusion of my care for the patient, or performed following the patient leaving the ED, was not necessarily reviewed by myself or used for MDM.  No orders to display           --------------------------------------------------------------------------------------------------------------------------------  Medical Decision Making      Orders:  Orders Placed This Encounter    MRI HAND LEFT W/WO CONTRAST    CANCELED: HIV1/HIV2 SCREEN, COMBINED ANTIGEN AND ANTIBODY    EXTRA TUBES    BLUE TOP TUBE    GOLD TOP TUBE    LIGHT GREEN TOP TUBE    LAVENDER TOP TUBE    LAVENDER TOP TUBE    HIV1/HIV2 SCREEN, COMBINED ANTIGEN AND ANTIBODY    acetaminophen (TYLENOL) tablet    morphine 4 mg/mL injection    morphine 4 mg/mL injection       Course and MDM:  Patient seen and examined. Labs and imaging performed while under my care in ED reviewed.  [name] is a 37 y.o. male who underwent the following ED course:    ED Course as of 11/13/22 3086   Sat Nov 13, 2022   5784 Hand call consulted       Course above is in addition to, and/or in conjunction with, course as noted below in this section of this note.    Following the above, ***  DDx includes: Based on the history, physical exam, and information available at this time, the working diagnosis is *** specifically because ***. *** were considered and deemed less likely due to ***. The risk of additional  workup would outweigh the benefits at this time. The risks and benefits of the workup and proposed treatment plan were discussed with patient in a shared decision making conversation.  Appropriate labs and imaging ordered as indicated by HPI and findings on physical exam.  Records review completed, thorough review of previous encounters, notes, labs, imaging, medications, allergies, treatments/therapies, and established plan of care completed today.  Labs reviewed by myself results discussed with patient in detail.  Imaging reviewed by myself report discussed with patient in detail.   Illness process discussed with patient.    ***  Patient was given the opportunity to ask any questions prior to ***  EKG: ***EKG showing regular rate and rhythm, no ST elevations or depressions, QTc within normal range on my personal review.         Medical Decision Making  Amount and/or Complexity of Data Reviewed  Radiology: ordered.    Risk  OTC drugs.  Prescription drug management.  Parenteral controlled substances.        Following history, physical exam, and studies, the patient was deemed stable and suitable for discharge. The patient was advised to return to the ED for any new or worsening symptoms. Discharge, medication and follow up instructions were discussed with the patient, who verbalized understanding. The patient is comfortable with the plan of  care. ***    The patient will be admitted to *** service for further management.  See their note for further patient care information and workup.    After discussing patient's HPI, ED course and plan, patient was signed out and care transferred to Dr. Antionette Poles at [facility] on 11/13/2022***. Please see their note for further patient care information.          Clinical Impression:  Clinical Impression   None       Disposition:  Data Unavailable    Follow up:  No follow-up provider specified.    Casey Burkitt, MD/  PGY-3 Emergency Medicine  Pager # [phone] - [employer] [employer]  [facility]  11/13/2022, 04:15    *Parts of this patients chart were completed in a retrospective fashion due to simultaneous direct patient care activities in the Emergency Department.   *This note was partially generated using MModal Fluency Direct system, and there may be some incorrect words, spellings, and punctuation that were not noted in checking the note before saving.

## 2022-11-13 NOTE — Nurses Notes (Signed)
Patient in MRI at this time. Pt c/o IV in left forearm "hurting." Unable to touch IV site without patient wincing and moving his arm in pain. Tissue surrounding IV seems to be semi-firm to the touch. IV d/c'd with tip intact.

## 2022-11-13 NOTE — ED Attending Handoff Note (Signed)
Care assumed from Dr. Cleone Slim:    37 y.o. male with L middle finger flexor tenosynovitis.  Hand on board.  Plastics on call and want MRI.  Does not want digital block.  Getting morphine.      ED Course:  Plastics offered operative management, patient would prefer abx and ouptt follow-up.  MRI completed.    Abx provided.    Patient vitally stable, safe for discharge home  All questions answered, return precautions discussed  Patient discharged home  Linna Darner, MD

## 2022-11-13 NOTE — ED Resident Handoff Note (Signed)
Emergency Department  Resident Course Note    Patient Name: [name]  Age and Gender: 37 y.o. male  Date of Birth: May 05, 1985  PCP: Peter Minium, MD      After a thorough discussion of the patient including presentation, ED course, and review of above information I have assumed care of [name] from Dr. Hubert Azure.    Triage Summary:   Finger Pain (Transfer from [facility] for concern of left 3rd digit finger flexor tenosynovitis seconardy to IV amphetamine use)      HPI / ED course:  In brief, patient is a 37 y.o. White male presenting with left middle finger pain and swelling. Turning black. Concern for abscess vs flexor teno. Plastics on hand call, requesting MRI.   Received clinda at outside facility   Morphine for pain     Pending Studies:  MRI finger     Plan:  F/u MRI   F/u plastics     ED course following signout:  After assuming care of [name], ED course included the following:    Following the above, MRI of the hand showed soft tissue inflammatory or infectious changes involving the distal aspect of the left 3rd finger which could be consistent with felon.  No obvious fluid accumulation seen along the flexor extensor tendon.  Patient was evaluated by Plastic surgery who offered operative management.  Patient declined, he would rather continue with antibiotics and follow up outpatient.  It was recommended that he continue taking his prescribed antibiotics.  Was also provided prescription for bacitracin.  Referral placed to plastic surgery for follow-up.  Strict return precautions provided.         Disposition:  Discharged    Clinical Impression:   Clinical Impression   Finger pain, left (Primary)        Follow up:   Hand Surgery, [facility]  [address]  [place] [address]  [phone]        [facility] - Emergency Department  [address] [facility]  [place]  [address]  [phone]    As needed, If symptoms worsen      Pertinent Imaging/Lab results:  Labs Ordered/Reviewed   HIV1/HIV2 SCREEN, COMBINED ANTIGEN AND ANTIBODY - Normal   EXTRA TUBES    Narrative:     The following orders were created for panel order EXTRA TUBES.  Procedure                               Abnormality         Status                     ---------                               -----------         ------                     [name] TOP ONGE[[id number]]                                    Final result               GOLD TOP MWNU[[id number]]  Final result               LIGHT GREEN TOP JWJX[[id number]]                             Final result               LAVENDER TOP OZHY[[id number]]                                Final result               LAVENDER TOP EXBM[[id number]]                                Final result                 Please view results for these tests on the individual orders.   BLUE TOP TUBE   GOLD TOP TUBE   LIGHT GREEN TOP TUBE   LAVENDER TOP TUBE   LAVENDER TOP TUBE     Results for orders placed or performed during the hospital encounter of 11/13/22   MRI HAND LEFT W/WO CONTRAST     Status: None    Narrative    [name]  Male, 37 years old.    MRI HAND LEFT W/WO CONTRAST performed on 11/13/2022 10:07 AM.    REASON FOR EXAM:  left finger flexor concern for flexor teno vs felon    INTRAVENOUS CONTRAST: 8 ml's of Vueway    TECHNIQUE: Multiple planar, multisequence MRI of the left hand with and without intravenous contrast.    COMPARISON: Left hand CT performed November 13, 2022    FINDINGS: Overall, the examination is limited due to extensive motion artifact throughout the study.    Within the limitations of the study, there does appear to be focal soft tissue swelling and edema or inflammatory changes involving the distal aspect of the left third finger. For example, on the fat-suppressed T2-weighted images there appears to be some edema at the palmar  aspect of the distal third finger as well as some focal fluid signal which may be subungual, series 1025 image 31. On the postcontrast imaging there is question of associated focal enhancement in these regions, series 1050 image 23. No clear organized, drainable abscess is visible.    Within the limitations of the study, there is no definite cortical destruction of the distal phalanx of the adjacent left third finger, although, the assessment for early changes of osteomyelitis is markedly limited. Nonspecific cystic changes are scattered in the carpal bones and at the third metatarsal head. No displaced fractures are visible. A T2 hyperintense focus near the second carpometacarpal joint, series 4 image 11, could represent a ganglion cyst.    Within the limitations of the study, there is no obvious large fluid collections along the flexor or extensor tendon sheaths in the left hand proper.      Impression    1. Limited examination due to extensive motion artifact throughout the study.    2. Within the limitations of the study, there appears to be soft tissue inflammatory or infectious changes involving the distal aspect of the left third finger which corresponds to the provided clinical history. This appearance could be seen with a finger felon with apparent inflammatory changes involving the palmar aspect of  the distal third finger and question of subungual inflammatory changes.    3. Within the limitations of the study, no obvious fluid accumulation is seen along the flexor or extensor tendon sheaths in the left hand proper.         [name], MD/  PGY-3 Emergency Medicine  [facility]  11/13/22 16:25    *Parts of this patients chart were completed in a retrospective fashion due to simultaneous direct patient care activities in the Emergency Department.   *This note was partially generated using MModal Fluency Direct system, and there may be some incorrect words, spellings, and punctuation that  were not noted in checking the note before saving.

## 2022-11-13 NOTE — ED Nurses Note (Signed)
Patient discharged home with family.  AVS reviewed with patient/care giver.  A written copy of the AVS and discharge instructions was given to the patient/care giver.  Questions sufficiently answered as needed.  Patient/care giver encouraged to follow up with PCP as indicated.  In the event of an emergency, patient/care giver instructed to call 911 or go to the nearest emergency room. Pt ambulated off unit with no difficulty.

## 2022-11-13 NOTE — ED Triage Notes (Signed)
Left hand 3rd finger pain, denies injury

## 2022-11-13 NOTE — Incoming ED Transfer Note (Addendum)
72M full code 80.3kg  Allergies: Penicillin   Transfer from [facility] for concern of left 3rd digit flexor tenosynovitis seconardy to IV amphetamine use.    Imaging  XR: Soft tissue edema without fracture, dislocation, or acute osseous abnormality of the left hand   CT: Soft tissue edema present within the second and third digits. No focal fluid collection identified. No soft tissue gas. No acute osseous abnormality.   Labs  Lactic acid: 2.5, CRP: 22.0  Lines  18G right basilic   20G left basilic   Medications  0140: 4mg  morphine  0141: 4mg  zofran  0142: 15mg  toradol   0145: 2g cefOXitin   0219: 6mg  morphine  0211: 1,500mg  vancomycin   0309: 6mg  morphine   Vitals  144/93 98% RA 99HR 18RR

## 2022-11-13 NOTE — Discharge Instructions (Signed)
Please continue antibiotics and follow up with Plastic surgery in clinic.  You can return to the emergency department with worsening symptoms or concerns.

## 2022-11-13 NOTE — Care Management Notes (Signed)
SW called [name] to set transfer to [facility] ER.  [name], SOCIAL WORKER

## 2022-11-13 NOTE — ED Nurses Note (Addendum)
Patient presents to ED via EMS as transfer from outside facility with concerns for flexor tenosynovitis. Pain to L middle finger. ROM limited. Hx IV amphetamine use. Denies recent trauma or injury but states he has been working with his hands a lot lately. A&O x4, ED MD at bedside.

## 2022-11-14 ENCOUNTER — Other Ambulatory Visit: Payer: Self-pay | Admitting: Critical Care Medicine

## 2022-11-15 ENCOUNTER — Encounter: Payer: Self-pay | Admitting: Internal Medicine

## 2022-11-15 ENCOUNTER — Ambulatory Visit: Payer: BC Managed Care – PPO | Attending: Internal Medicine | Admitting: Internal Medicine

## 2022-11-15 ENCOUNTER — Encounter (HOSPITAL_BASED_OUTPATIENT_CLINIC_OR_DEPARTMENT_OTHER): Payer: Self-pay

## 2022-11-15 VITALS — BP 130/90 | HR 69 | Ht 73.0 in | Wt 267.8 lb

## 2022-11-15 DIAGNOSIS — E785 Hyperlipidemia, unspecified: Secondary | ICD-10-CM | POA: Diagnosis not present

## 2022-11-15 DIAGNOSIS — E1169 Type 2 diabetes mellitus with other specified complication: Secondary | ICD-10-CM

## 2022-11-15 MED ORDER — EZETIMIBE 10 MG PO TABS: 10.0000 mg | ORAL_TABLET | Freq: Every day | ORAL | 3 refills | Status: DC

## 2022-11-15 NOTE — Patient Instructions (Signed)
Medication Instructions:  Start Zetia 10 mg daily  Continue taking other medications *If you need a refill on your cardiac medications before your next appointment, please call your pharmacy*   Lab Work: Lipid profile in 3 months   If you have labs (blood work) drawn today and your tests are completely normal, you will receive your results only by: MyChart Message (if you have MyChart) OR A paper copy in the mail If you have any lab test that is abnormal or we need to change your treatment, we will call you to review the results.   Testing/Procedures: None    Follow-Up: At [facility], you and your health needs are our priority.  As part of our continuing mission to provide you with exceptional heart care, we have created designated Provider Care Teams.  These Care Teams include your primary Cardiologist (physician) and Advanced Practice Providers (APPs -  Physician Assistants and Nurse Practitioners) who all work together to provide you with the care you need, when you need it.    Your next appointment:   6 month(s)  Provider:   Dr. Carolan Clines

## 2022-11-15 NOTE — Telephone Encounter (Signed)
Requested Prescriptions  Refused Prescriptions Disp Refills   metFORMIN (GLUCOPHAGE) 1000 MG tablet [Pharmacy Med Name: METFORMIN HCL 1,000 MG TABLET] 180 tablet 3    Sig: TAKE 1 TABLET (1,000 MG TOTAL) BY MOUTH TWICE A DAY WITH FOOD     Endocrinology:  Diabetes - Biguanides Failed - 11/14/2022  8:45 AM      Failed - B12 Level in normal range and within 720 days    No results found for: "VITAMINB12"       Failed - Valid encounter within last 6 months    Recent Outpatient Visits           1 year ago Type 2 diabetes mellitus with hyperglycemia, with long-term current use of insulin (HCC)   [facility] [place] Storm Frisk, MD       Future Appointments             In 1 month [name], MD [facility] - Cr in normal range and within 360 days    Creat  Date Value Ref Range Status  08/26/2016 0.94 0.60 - 1.35 mg/dL Final   Creatinine, Ser  Date Value Ref Range Status  09/22/2022 0.96 0.40 - 1.50 mg/dL Final   Creatinine, POC  Date Value Ref Range Status  01/17/2019 43.7 mg/dL Final   Creatinine,U  Date Value Ref Range Status  05/03/2022 96.3 mg/dL Final         Passed - HBA1C is between 0 and 7.9 and within 180 days    HbA1c, POC (controlled diabetic range)  Date Value Ref Range Status  09/28/2021 11.6 (A) 0.0 - 7.0 % Final   Hgb A1c MFr Bld  Date Value Ref Range Status  09/22/2022 6.9 (H) 4.6 - 6.5 % Final    Comment:    Glycemic Control Guidelines for People with Diabetes:Non Diabetic:  <6%Goal of Therapy: <7%Additional Action Suggested:  >8%          Passed - eGFR in normal range and within 360 days    GFR calc Af Amer  Date Value Ref Range Status  12/07/2019 121 >59 mL/min/1.73 Final    Comment:    **In accordance with recommendations from the NKF-ASN Task force,**   [facility] is in the process of updating its eGFR calculation to the    2021 CKD-EPI creatinine equation that estimates kidney function   without a race variable.    GFR, Estimated  Date Value Ref Range Status  05/20/2022 >60 >60 mL/min Final    Comment:    (NOTE) Calculated using the CKD-EPI Creatinine Equation (2021)    GFR  Date Value Ref Range Status  09/22/2022 101.83 >60.00 mL/min Final    Comment:    Calculated using the CKD-EPI Creatinine Equation (2021)   eGFR  Date Value Ref Range Status  09/28/2021 106 >59 mL/min/1.73 Final         Passed - CBC within normal limits and completed in the last 12 months    WBC  Date Value Ref Range Status  09/22/2022 4.8 4.0 - 10.5 K/uL Final   RBC  Date Value Ref Range Status  09/22/2022 5.02 4.22 - 5.81 Mil/uL Final   Hemoglobin  Date Value Ref Range Status  09/22/2022 13.0 13.0 - 17.0 g/dL Final  78/29/5621 30.8 13.0 - 17.7 g/dL Final  HCT  Date Value Ref Range Status  09/22/2022 40.4 39.0 - 52.0 % Final   Hematocrit  Date Value Ref Range Status  09/28/2021 41.4 37.5 - 51.0 % Final   MCHC  Date Value Ref Range Status  09/22/2022 32.1 30.0 - 36.0 g/dL Final   [facility]  Date Value Ref Range Status  05/20/2022 27.0 26.0 - 34.0 pg Final   MCV  Date Value Ref Range Status  09/22/2022 80.4 78.0 - 100.0 fl Final  09/28/2021 83 79 - 97 fL Final   No results found for: "PLTCOUNTKUC", "LABPLAT", "POCPLA" RDW  Date Value Ref Range Status  09/22/2022 13.6 11.5 - 15.5 % Final  09/28/2021 12.7 11.6 - 15.4 % Final

## 2022-11-15 NOTE — Progress Notes (Signed)
Cardiology Office Note:    Date:  11/15/2022   ID:  Jesse Duncan, DOB 04-08-1985, MRN [id number]  PCP:  Jesse Grandchild, MD   [facility] Providers Cardiologist:  Jesse Noe, MD (Inactive)     Referring MD: Jesse Grandchild, MD   No chief complaint on file. CVA  History of Present Illness:    Jesse Duncan is a 37 y.o. male with a hx of obstructive sleep apnea, obesity, former smoker, ???paroxysmal atrial fibrillation, poorly controlled DM2, HTN . Patient of Dr. Katrinka Duncan. Last saw in 2021. EKGs this year are in sinus rhythm. He was hospitalized 04/22/2022 2/2 acute CVA Acute infarct left MCA and left PCA territories with cytotoxic edema. CTA head and neck showed ccclusion left P2 PCA, left M2 MCA. Severe proximal left intradural vertebral artery stenosis. Moderate right M1 MCA stenosis. Planned for DAPT for 3 months. EF 40-45%. No afib. Has not had recurrence since DCCV in 2017  Today, he feels well. He is taking his medications each day. Blood glucose mainly less than 200. Taking his insulin, he was unsure of the dosing.  No LE edema. No orthopnea or PND.  Interim 11/15/2022 His A1c has improved. His blood pressure is ok 130/90 mmHg. Has not taken carvedilol 12.5 mg BID. He has been eating better. He just had weight loss surgery in 10/22.  Past Medical History:  Diagnosis Date   Allergic rhinitis due to pollen 07/06/2010   Atrial fibrillation (HCC) 08/15/2015   Diabetes mellitus without complication (HCC)    Dyslipidemia    Hyperlipidemia 05/25/2010   Hypertension    Impaired fasting blood sugar 08/15/2015   Obesity    OSA (obstructive sleep apnea) 10/20/2015   Prediabetes    Sleep apnea    Smoker    Tobacco use disorder 11/14/2013    Past Surgical History:  Procedure Laterality Date   BREAST SURGERY      Current Medications: Current Meds  Medication Sig   aspirin EC 325 MG tablet Take 1 tablet (325 mg total) by mouth daily. Swallow whole. Take  with Plavix for 90 days, after 90 stop plavix and continue on Aspirin   carvedilol (COREG) 12.5 MG tablet Take 1 tablet (12.5 mg total) by mouth 2 (two) times daily with a meal.   clopidogrel (PLAVIX) 75 MG tablet Take 1 tablet (75 mg total) by mouth daily. Take along with aspirin for 90 days and then stop Plavix, but continue on ASA   Continuous Glucose Receiver (DEXCOM G7 RECEIVER) DEVI 1 Act by Does not apply route daily.   Continuous Glucose Sensor (DEXCOM G7 SENSOR) MISC 1 Act by Does not apply route daily.   diltiazem (CARDIZEM CD) 300 MG 24 hr capsule TAKE 1 CAPSULE BY MOUTH EVERY DAY   glucose blood test strip Use as instructed   hydrALAZINE (APRESOLINE) 25 MG tablet Take 1 tablet (25 mg total) by mouth 3 (three) times daily.   pantoprazole (PROTONIX) 40 MG tablet Take 1 tablet (40 mg total) by mouth daily.   rosuvastatin (CRESTOR) 40 MG tablet Take 1 tablet (40 mg total) by mouth daily.   tirzepatide [facility]) 5 MG/0.5ML Pen Inject 5 mg into the skin once a week.     Allergies:   Patient has no known allergies.   Social History   Socioeconomic History   Marital status: Married    Spouse name: Not on file   Number of children: Not on file   Years of education: Not on  file   Highest education level: Not on file  Occupational History   Not on file  Tobacco Use   Smoking status: Every Day    Types: Cigars    Passive exposure: Past   Smokeless tobacco: Never  Vaping Use   Vaping status: Former  Substance and Sexual Activity   Alcohol use: Yes    Comment: occ   Drug use: Never   Sexual activity: Yes    Partners: Female  Other Topics Concern   Not on file  Social History Narrative   ** Merged History Encounter **       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      Social Determinants of Health   Financial Resource Strain: Not on file  Food Insecurity: No Food Insecurity (04/26/2022)   Hunger Vital Sign    Worried About Running Out of Food in the Last  Year: Never true    Ran Out of Food in the Last Year: Never true  Transportation Needs: No Transportation Needs (04/26/2022)   PRAPARE - Administrator, Civil Service (Medical): No    Lack of Transportation (Non-Medical): No  Physical Activity: Not on file  Stress: Not on file  Social Connections: Not on file     Family History: The patient's family history includes COPD in his father; Cancer in his maternal grandfather; Healthy in his father; Hypertension in his mother; Stroke in his maternal grandmother. There is no history of Heart disease.  ROS:   Please see the history of present illness.     All other systems reviewed and are negative.  EKGs/Labs/Other Studies Reviewed:    The following studies were reviewed today:   EKG:  EKG is  ordered today.  The ekg ordered today demonstrates   TTE 08/04/2015- EF 40%, nl RV, no sig valve dx. Noted increased trabeculations  TTE 04/23/2022- EF 40-45%, global hypokinesis, nl RV, no valve dx  EKG 05/11/2022: NSR, sinus arrhythmia  Recent Labs: 04/08/2022: Pro B Natriuretic peptide (BNP) 30.0 05/20/2022: ALT 37 09/22/2022: BUN 14; Creatinine, Ser 0.96; Hemoglobin 13.0; Platelets 292.0; Potassium 3.8; Sodium 136; TSH 1.74  Recent Lipid Panel    Component Value Date/Time   CHOL 163 09/22/2022 1438   CHOL 298 (H) 09/28/2021 0953   TRIG 217.0 (H) 09/22/2022 1438   HDL 40.20 09/22/2022 1438   HDL 44 09/28/2021 0953   CHOLHDL 4 09/22/2022 1438   VLDL 43.4 (H) 09/22/2022 1438   LDLCALC 79 09/22/2022 1438   LDLCALC 208 (H) 09/28/2021 0953   LDLDIRECT 223.0 04/08/2022 1533     Risk Assessment/Calculations:    CHA2DS2-VASc Score = 5   This indicates a 7.2% annual risk of stroke. The patient's score is based upon: CHF History: 1 HTN History: 1 Diabetes History: 1 Stroke History: 2 Vascular Disease History: 0 Age Score: 0 Gender Score: 0          Physical Exam:    VS:  Pulse 69   Ht 6\' 1"  (1.854 m)   Wt 267 lb 12.8  oz (121.5 kg)   SpO2 97%   BMI 35.33 kg/m     Wt Readings from Last 3 Encounters:  11/15/22 267 lb 12.8 oz (121.5 kg)  09/22/22 (!) 311 lb (141.1 kg)  07/12/22 294 lb (133.4 kg)     GEN:  Well nourished, well developed in no acute distress HEENT: Normal NECK: No JVD;  CARDIAC: RRR, no murmurs, rubs, gallops RESPIRATORY:  Clear  to auscultation without rales, wheezing or rhonchi  ABDOMEN: Soft, non-tender, non-distended MUSCULOSKELETAL:  No edema; No deformity  SKIN: Warm and dry NEUROLOGIC:  Alert and oriented x 3 PSYCHIATRIC:  Normal affect   ASSESSMENT:    ? Remote Paroxysmal Afib: s/p ? DCCV 2017.  he had no recurrence and was not on anticoagulation prior.  However he developed a CVA yet anticoagulation was not started for him.  He did not have atrial fibrillation during that hospitalization.  He had not had recurrence of 2017.  And his cardiac monitor did not show atrial fibrillation.  Therefore we will hold off on anticoagulation for now, however we will continue to monitor. - continue coreg 12.5 mg BID  Combined Chronic Systolic and diastolic HF: EF 40-98%.No prior ischemic eval - euvolemic - continue coreg 12.5 mg BID - continue hydralazine 25 mg TID -There has been confusion relating to jardiance.  Will hold off for now.  MCA stroke: s/p  DAPT, continue asa 81 mg daily  Continue crestor 40 mg daily  OSA: does not wear his CPAP, notes it can cause stomach upset with all of the air. Notes he lost weight and stopped wearing it, I encouraged him to continue  DMII: Was very poorly controlled A1c 14. goal A1c < 7.  Most recent A1c is down to 6.9%.  He is status post gastric sleeve.  HTN: Continue diltiazem 300 mg daily, hydralazine 25 mg TID and coreg 12.5 mg BID. BP goal < 130/80 mmHg, Recommend continued ambulatory monitoring  HLD: Direct LDL 223.  Now it is 79 mg/dL. continue crestor 40 mg daily. Will add zetia 10 mg daily for LDL goal less than 70 mg/dL  PLAN:    In  order of problems listed above:    Add zetia 10 mg daily Recheck LDL in 3 months Follow up in 6 months       Medication Adjustments/Labs and Tests Ordered: Current medicines are reviewed at length with the patient today.  Concerns regarding medicines are outlined above.  No orders of the defined types were placed in this encounter.  No orders of the defined types were placed in this encounter.   There are no Patient Instructions on file for this visit.   Signed, Maisie Fus, MD  11/15/2022 8:13 AM    [facility]

## 2022-11-16 DIAGNOSIS — F4320 Adjustment disorder, unspecified: Secondary | ICD-10-CM | POA: Diagnosis not present

## 2022-11-16 IMAGING — CT CT ANGIO CHEST-ABD-PELV FOR DISSECTION W/ AND WO/W CM
2 of 7 series · 14 of 46 positions shown, 16 images · IV contrast (APPLIED)
Comparison: None.

CLINICAL DATA: Chest pain.

EXAM:
CT ANGIOGRAPHY CHEST, ABDOMEN AND PELVIS
TECHNIQUE: Non-contrast CT of the chest was initially obtained.

[Series 6: arterial · axial · arterial · 0.98mm/px · z∈[-691,-75]mm · 11 of 348 slices shown, 13 images]
[im 20/348  soft-tissue]
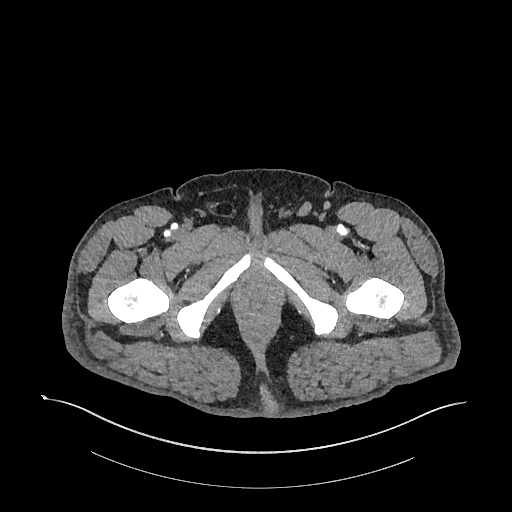
[im 20/348  bone]
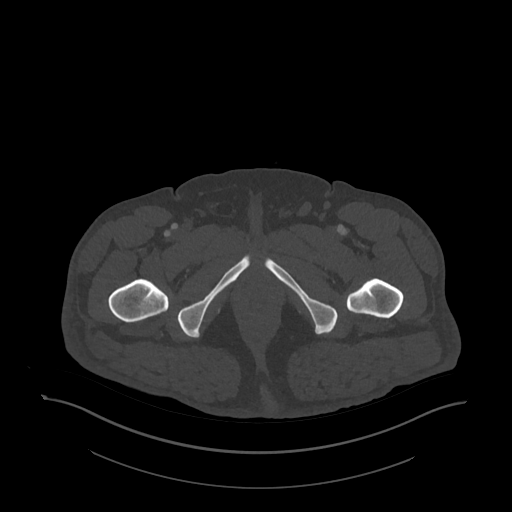
[im 58/348  soft-tissue]
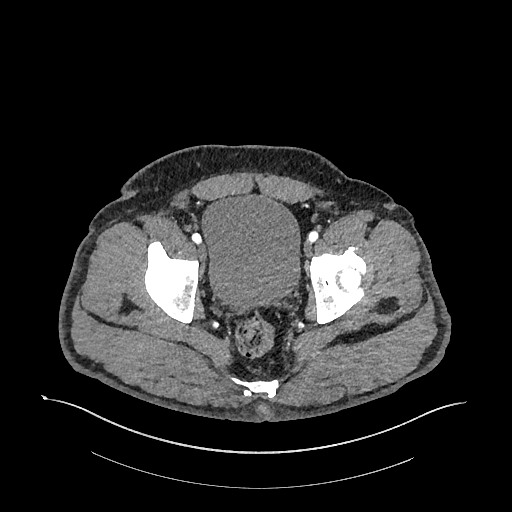
[im 78/348  soft-tissue]
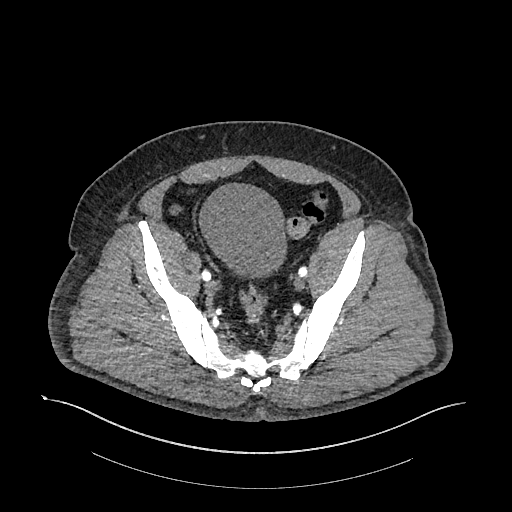
[im 116/348  soft-tissue]
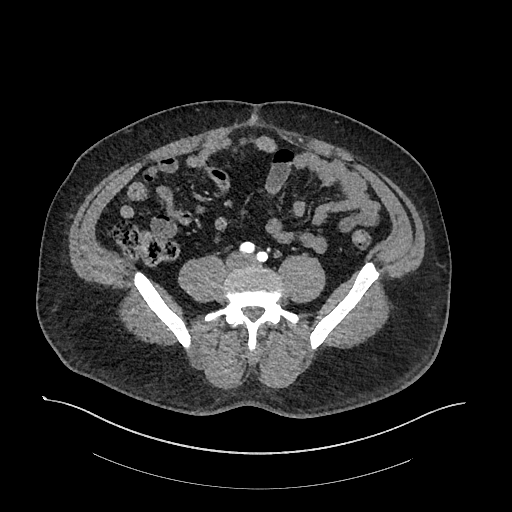
[im 135/348  soft-tissue]
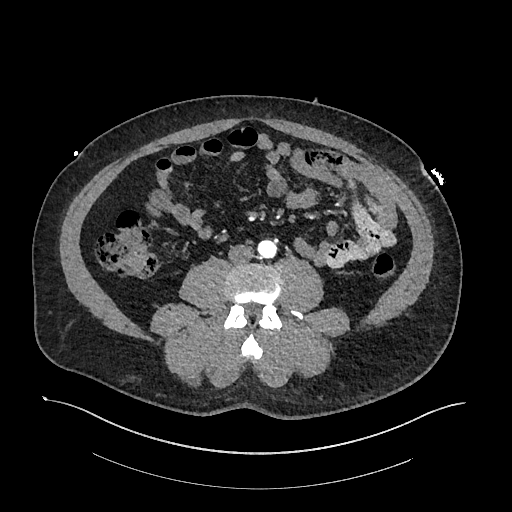
[im 174/348  soft-tissue]
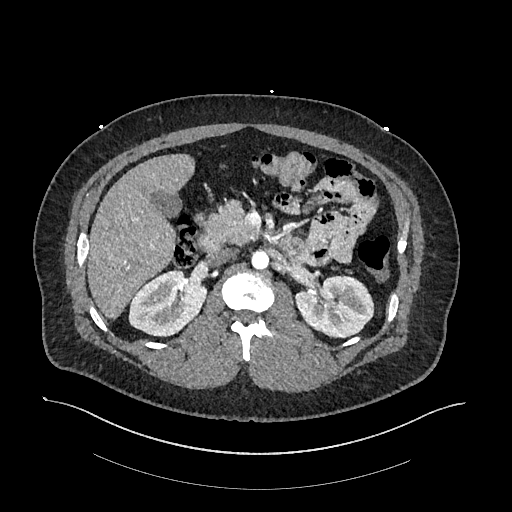
[im 213/348  soft-tissue]
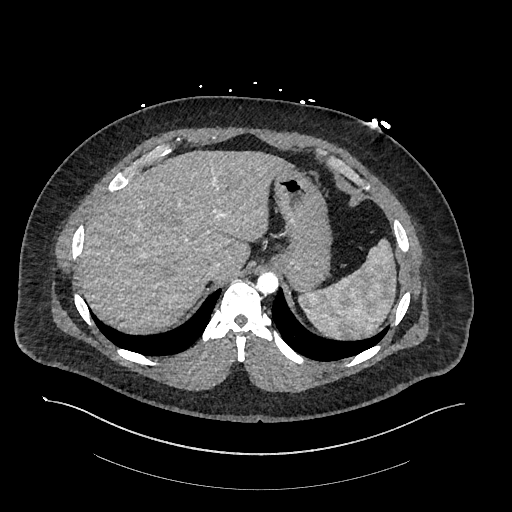
[im 232/348  soft-tissue]
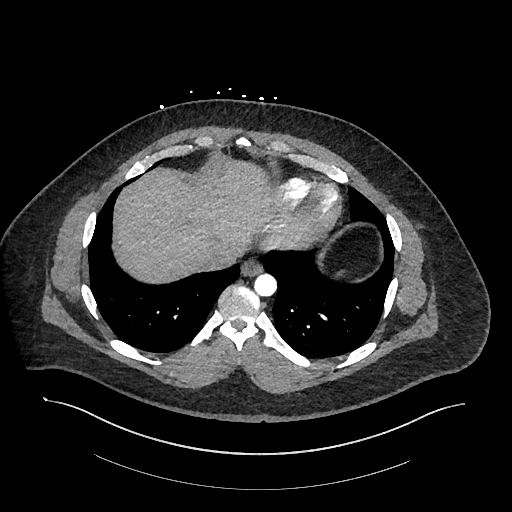
[im 270/348  soft-tissue]
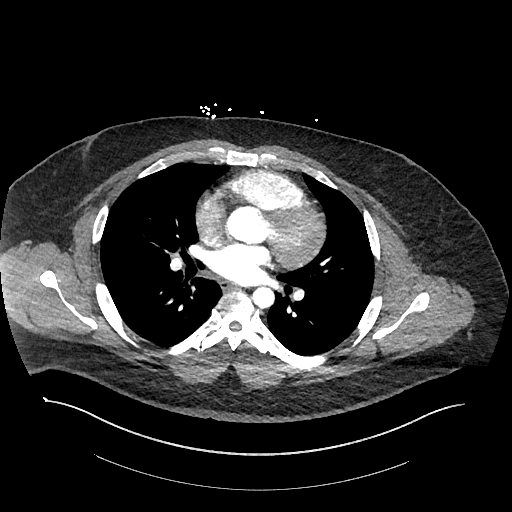
[im 270/348  bone]
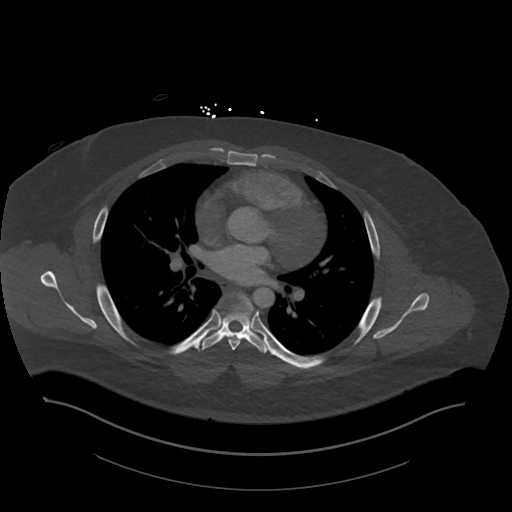
[im 290/348  soft-tissue]
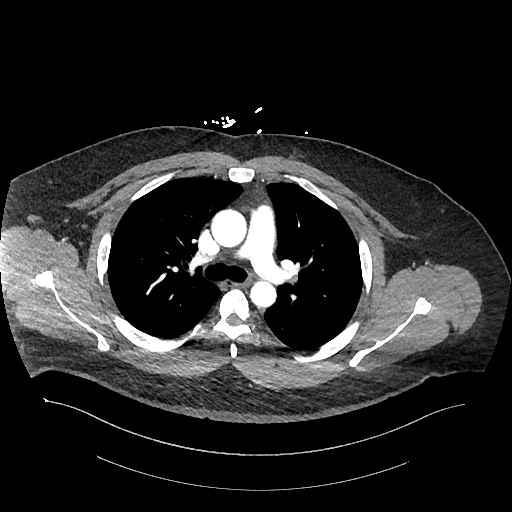
[im 328/348  soft-tissue]
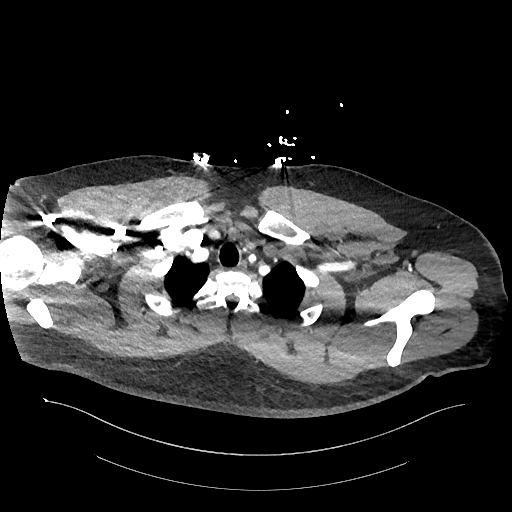

[Series 9: cor · coronal · 0.80mm/px · 3 of 151 slices shown]
[im 38/151  soft-tissue]
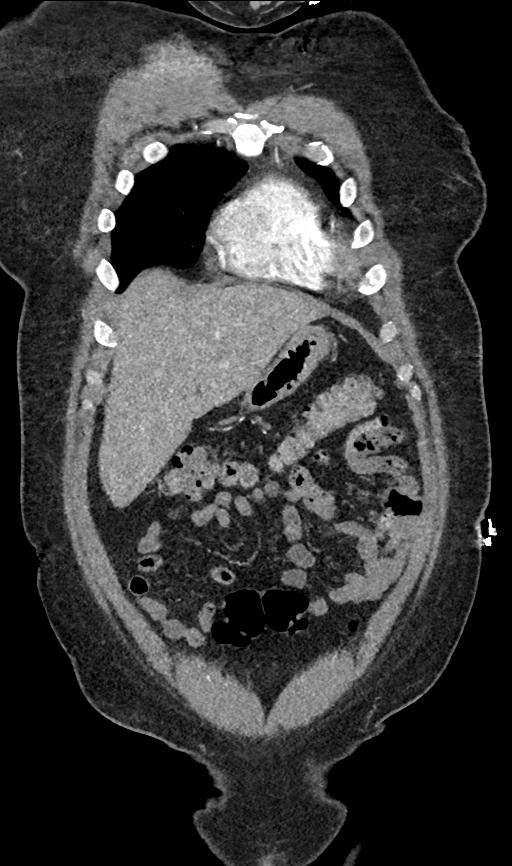
[im 76/151  soft-tissue]
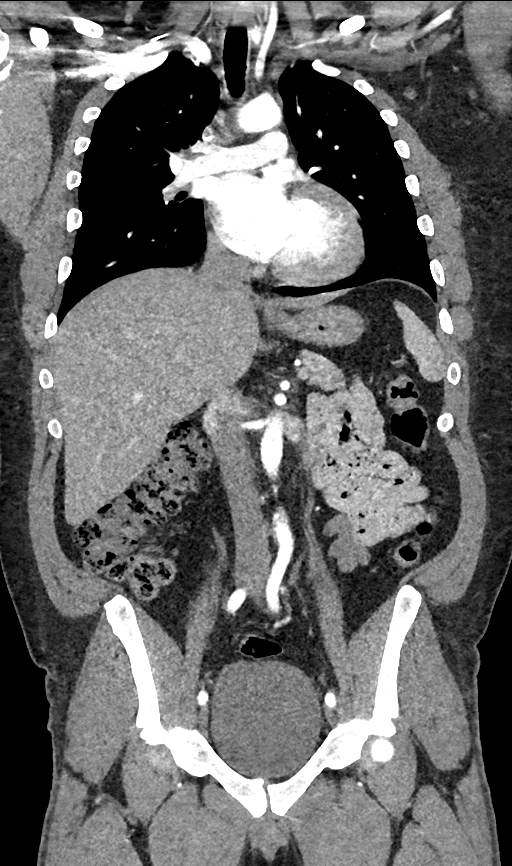
[im 113/151  soft-tissue]
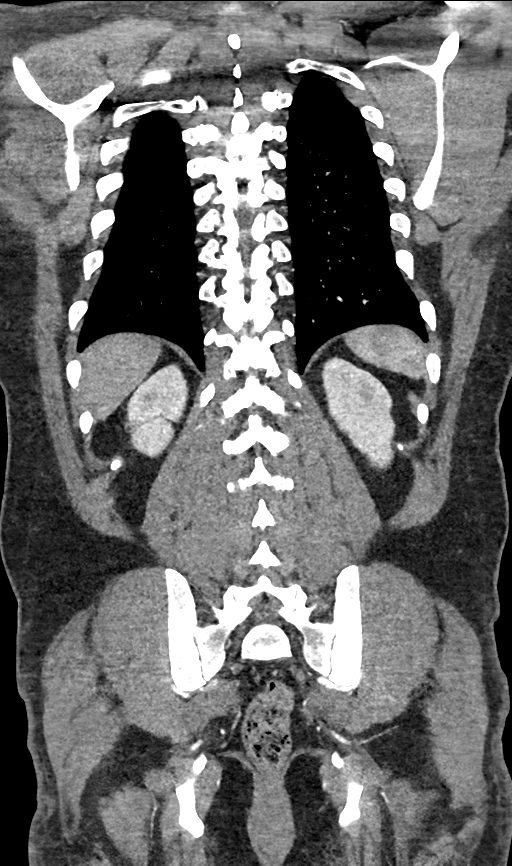

[14 of 46 positions shown; findings below may reference images not displayed]

Multidetector CT imaging through the chest, abdomen and pelvis was
performed using the standard protocol during bolus administration of
intravenous contrast. Multiplanar reconstructed images and MIPs were
obtained and reviewed to evaluate the vascular anatomy.

RADIATION DOSE REDUCTION: This exam was performed according to the
departmental dose-optimization program which includes automated
exposure control, adjustment of the mA and/or kV according to
patient size and/or use of iterative reconstruction technique.

CONTRAST:  100mL OMNIPAQUE IOHEXOL 350 MG/ML SOLN
FINDINGS: CTA CHEST FINDINGS

Cardiovascular: Preferential opacification of the thoracic aorta. No
evidence of thoracic aortic aneurysm or dissection.

Mild cardiac enlargement.  No pericardial effusion identified.

Mediastinum/Nodes: No enlarged mediastinal, hilar, or axillary lymph
nodes. Thyroid gland, trachea, and esophagus demonstrate no
significant findings.

Lungs/Pleura: Lungs are clear. No pleural effusion or pneumothorax.

Musculoskeletal: No chest wall abnormality. No acute or significant
osseous findings.

Review of the MIP images confirms the above findings.

CTA ABDOMEN AND PELVIS FINDINGS

VASCULAR

Aorta: Normal caliber aorta without aneurysm, dissection, vasculitis
or significant stenosis.

Celiac: Patent without evidence of aneurysm, dissection, vasculitis
or significant stenosis.

SMA: Patent without evidence of aneurysm, dissection, vasculitis or
significant stenosis.

Renals: Both renal arteries are patent without evidence of aneurysm,
dissection, vasculitis, fibromuscular dysplasia or significant
stenosis.

IMA: Patent without evidence of aneurysm, dissection, vasculitis or
significant stenosis.

Inflow: Patent without evidence of aneurysm, dissection, vasculitis
or significant stenosis.

Veins: No obvious venous abnormality within the limitations of this
arterial phase study.

Review of the MIP images confirms the above findings.

NON-VASCULAR

Hepatobiliary: No focal liver abnormality is seen. No gallstones,
gallbladder wall thickening, or biliary dilatation.

Pancreas: Unremarkable. No pancreatic ductal dilatation or
surrounding inflammatory changes.

Spleen: Normal in size without focal abnormality.

Adrenals/Urinary Tract: Normal adrenal glands. No kidney mass or
hydronephrosis identified. Bladder is unremarkable.

Stomach/Bowel: Stomach appears within normal limits. No bowel wall
thickening, inflammation, or distension. The appendix is visualized
and is normal.

Lymphatic: No abdominopelvic adenopathy.

Reproductive: Prostate is unremarkable.

Other: No free fluid or fluid collection within the abdomen or
pelvis. No signs of pneumoperitoneum.

Musculoskeletal: No acute or significant osseous findings.

Review of the MIP images confirms the above findings.
IMPRESSION: 1. No evidence of aortic aneurysm or dissection.
2. No acute findings within the chest, abdomen or pelvis to explain
patient's chest pain.
3. Mild cardiac enlargement.

## 2022-11-16 IMAGING — CR DG CHEST 2V
2 series · 2 of 2 positions shown · non-contrast
Comparison: None.

CLINICAL DATA: Chest pain and shortness of breath.

EXAM:
CHEST - 2 VIEW

[chest pa]
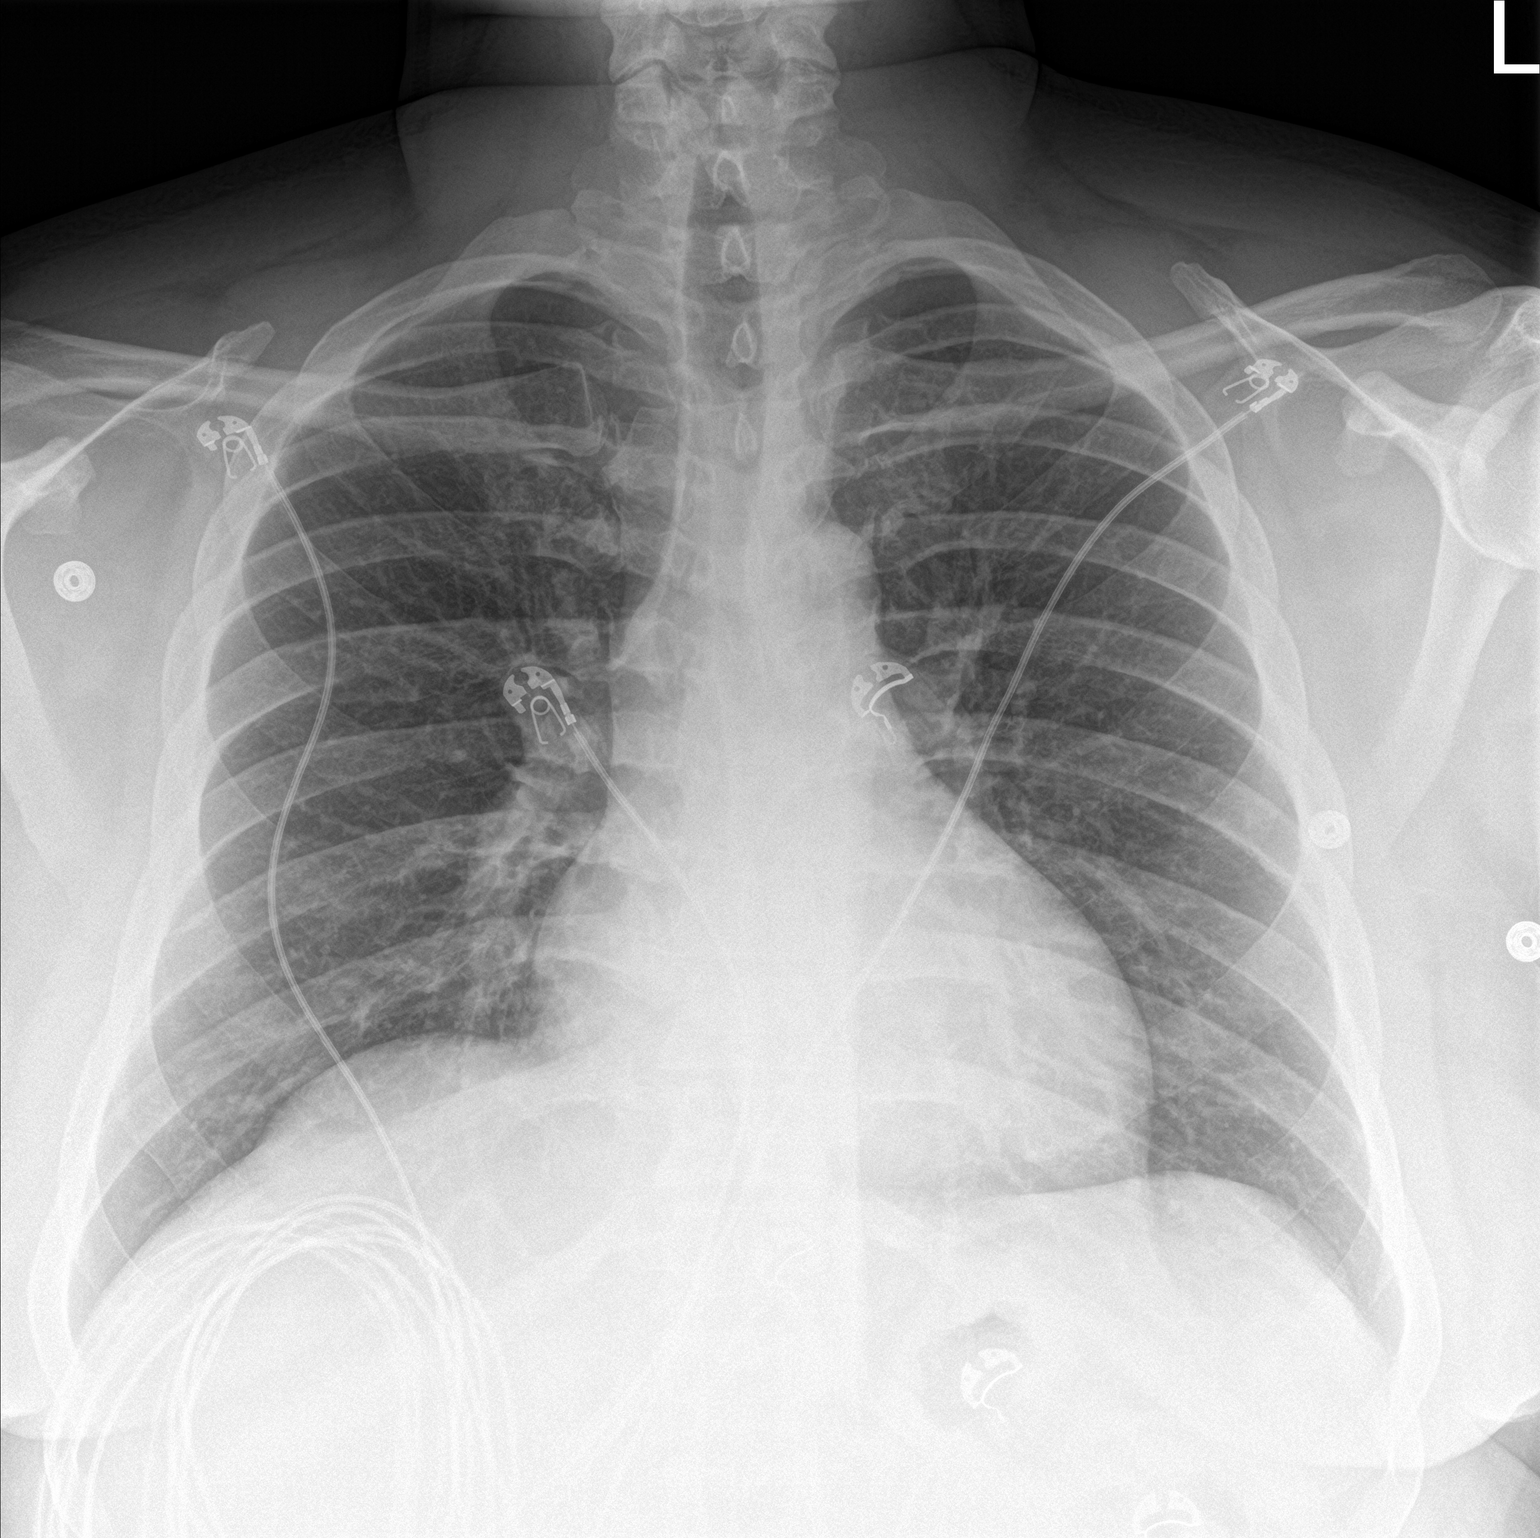

[chest lat]
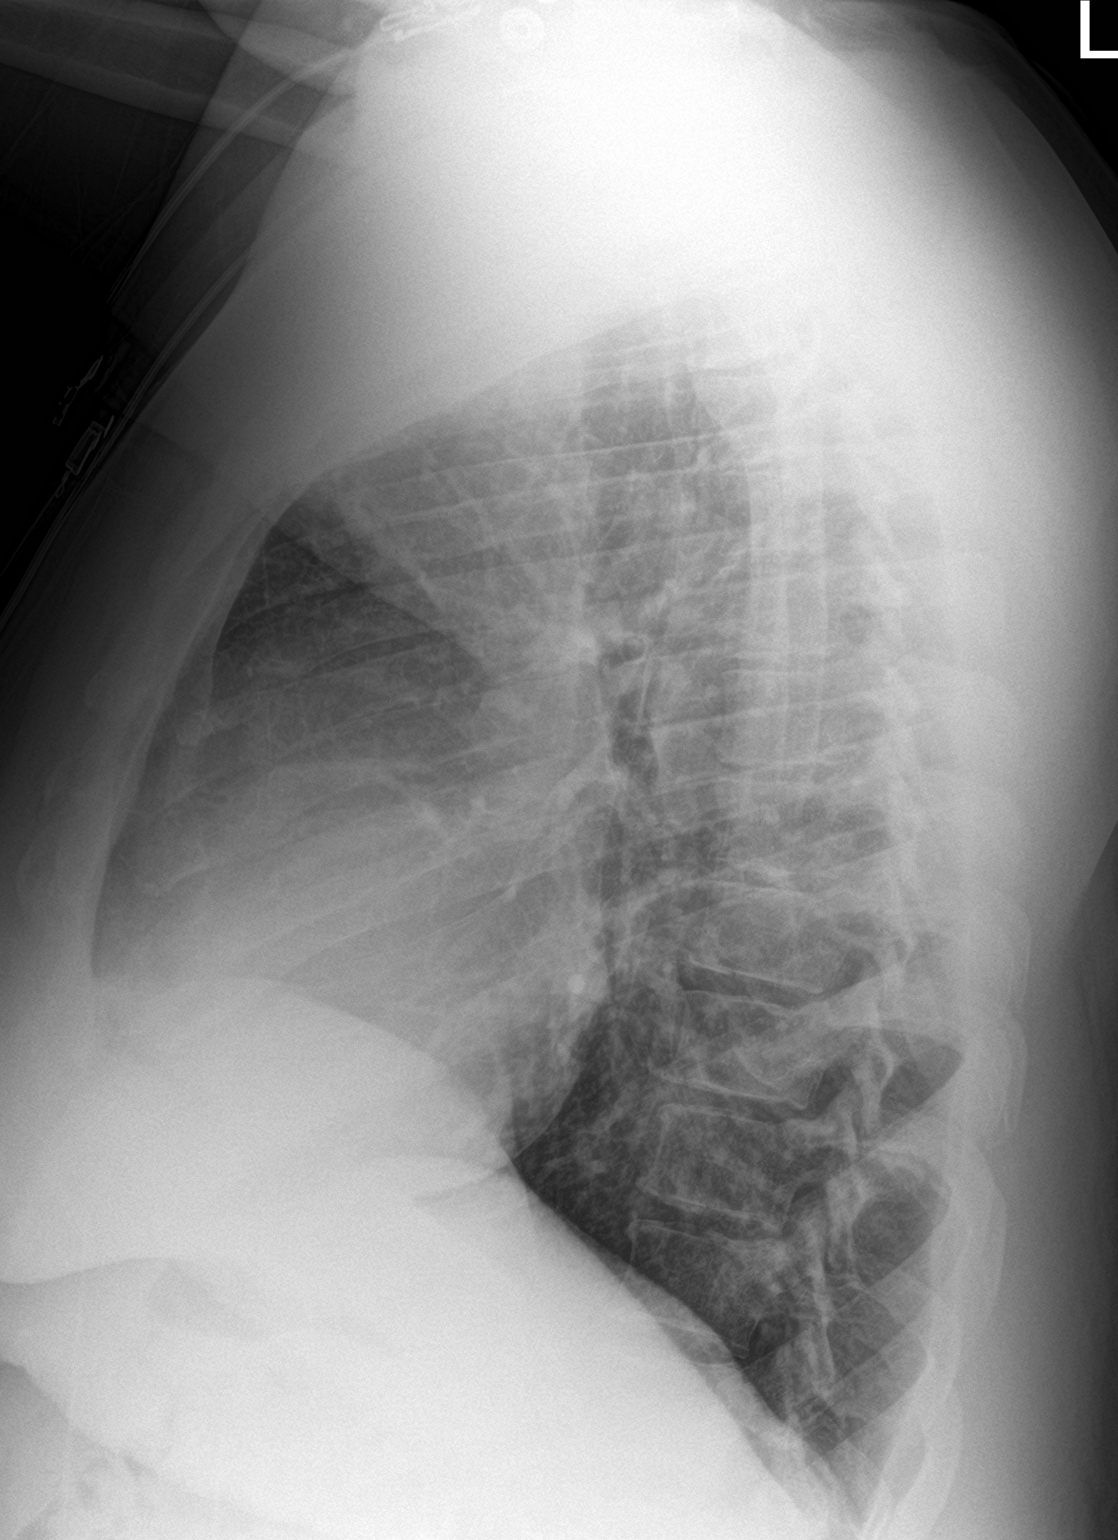

[2 of 2 positions shown; findings below may reference images not displayed]

FINDINGS: The heart size and mediastinal contours are within normal limits.
Both lungs are clear. The visualized skeletal structures are
unremarkable.
IMPRESSION: No active cardiopulmonary disease.

## 2022-11-18 LAB — ADULT ROUTINE BLOOD CULTURE, SET OF 2 BOTTLES (BACTERIA AND YEAST)
BLOOD CULTURE, ROUTINE: NO GROWTH
BLOOD CULTURE, ROUTINE: NO GROWTH

## 2022-11-19 ENCOUNTER — Other Ambulatory Visit: Payer: Self-pay | Admitting: Internal Medicine

## 2022-11-19 DIAGNOSIS — E119 Type 2 diabetes mellitus without complications: Secondary | ICD-10-CM

## 2022-11-25 ENCOUNTER — Ambulatory Visit: Payer: BC Managed Care – PPO | Admitting: Physician Assistant

## 2022-11-30 DIAGNOSIS — Z6835 Body mass index (BMI) 35.0-35.9, adult: Secondary | ICD-10-CM | POA: Diagnosis not present

## 2022-11-30 DIAGNOSIS — Z9884 Bariatric surgery status: Secondary | ICD-10-CM | POA: Diagnosis not present

## 2022-11-30 DIAGNOSIS — Z713 Dietary counseling and surveillance: Secondary | ICD-10-CM | POA: Diagnosis not present

## 2022-12-01 DIAGNOSIS — E569 Vitamin deficiency, unspecified: Secondary | ICD-10-CM | POA: Diagnosis not present

## 2022-12-01 DIAGNOSIS — Z713 Dietary counseling and surveillance: Secondary | ICD-10-CM | POA: Diagnosis not present

## 2022-12-01 DIAGNOSIS — Z903 Acquired absence of stomach [part of]: Secondary | ICD-10-CM | POA: Diagnosis not present

## 2022-12-16 ENCOUNTER — Encounter: Payer: Self-pay | Admitting: Internal Medicine

## 2022-12-16 ENCOUNTER — Ambulatory Visit (INDEPENDENT_AMBULATORY_CARE_PROVIDER_SITE_OTHER): Payer: BC Managed Care – PPO | Admitting: Internal Medicine

## 2022-12-16 VITALS — BP 166/108 | HR 63 | Temp 98.4°F | Resp 16 | Ht 73.0 in | Wt 266.0 lb

## 2022-12-16 DIAGNOSIS — I10 Essential (primary) hypertension: Secondary | ICD-10-CM

## 2022-12-16 DIAGNOSIS — D539 Nutritional anemia, unspecified: Secondary | ICD-10-CM

## 2022-12-16 DIAGNOSIS — Z794 Long term (current) use of insulin: Secondary | ICD-10-CM | POA: Diagnosis not present

## 2022-12-16 DIAGNOSIS — I5042 Chronic combined systolic (congestive) and diastolic (congestive) heart failure: Secondary | ICD-10-CM

## 2022-12-16 DIAGNOSIS — E119 Type 2 diabetes mellitus without complications: Secondary | ICD-10-CM

## 2022-12-16 NOTE — Patient Instructions (Signed)
Health Maintenance, Male Adopting a healthy lifestyle and getting preventive care are important in promoting health and wellness. Ask your health care provider about: The right schedule for you to have regular tests and exams. Things you can do on your own to prevent diseases and keep yourself healthy. What should I know about diet, weight, and exercise? Eat a healthy diet  Eat a diet that includes plenty of vegetables, fruits, low-fat dairy products, and lean protein. Do not eat a lot of foods that are high in solid fats, added sugars, or sodium. Maintain a healthy weight Body mass index (BMI) is a measurement that can be used to identify possible weight problems. It estimates body fat based on height and weight. Your health care provider can help determine your BMI and help you achieve or maintain a healthy weight. Get regular exercise Get regular exercise. This is one of the most important things you can do for your health. Most adults should: Exercise for at least 150 minutes each week. The exercise should increase your heart rate and make you sweat (moderate-intensity exercise). Do strengthening exercises at least twice a week. This is in addition to the moderate-intensity exercise. Spend less time sitting. Even light physical activity can be beneficial. Watch cholesterol and blood lipids Have your blood tested for lipids and cholesterol at 36 years of age, then have this test every 5 years. You may need to have your cholesterol levels checked more often if: Your lipid or cholesterol levels are high. You are older than 36 years of age. You are at high risk for heart disease. What should I know about cancer screening? Many types of cancers can be detected early and may often be prevented. Depending on your health history and family history, you may need to have cancer screening at various ages. This may include screening for: Colorectal cancer. Prostate cancer. Skin cancer. Lung  cancer. What should I know about heart disease, diabetes, and high blood pressure? Blood pressure and heart disease High blood pressure causes heart disease and increases the risk of stroke. This is more likely to develop in people who have high blood pressure readings or are overweight. Talk with your health care provider about your target blood pressure readings. Have your blood pressure checked: Every 3-5 years if you are 18-39 years of age. Every year if you are 40 years old or older. If you are between the ages of 65 and 75 and are a current or former smoker, ask your health care provider if you should have a one-time screening for abdominal aortic aneurysm (AAA). Diabetes Have regular diabetes screenings. This checks your fasting blood sugar level. Have the screening done: Once every three years after age 45 if you are at a normal weight and have a low risk for diabetes. More often and at a younger age if you are overweight or have a high risk for diabetes. What should I know about preventing infection? Hepatitis B If you have a higher risk for hepatitis B, you should be screened for this virus. Talk with your health care provider to find out if you are at risk for hepatitis B infection. Hepatitis C Blood testing is recommended for: Everyone born from 1945 through 1965. Anyone with known risk factors for hepatitis C. Sexually transmitted infections (STIs) You should be screened each year for STIs, including gonorrhea and chlamydia, if: You are sexually active and are younger than 36 years of age. You are older than 36 years of age and your   health care provider tells you that you are at risk for this type of infection. Your sexual activity has changed since you were last screened, and you are at increased risk for chlamydia or gonorrhea. Ask your health care provider if you are at risk. Ask your health care provider about whether you are at high risk for HIV. Your health care provider  may recommend a prescription medicine to help prevent HIV infection. If you choose to take medicine to prevent HIV, you should first get tested for HIV. You should then be tested every 3 months for as long as you are taking the medicine. Follow these instructions at home: Alcohol use Do not drink alcohol if your health care provider tells you not to drink. If you drink alcohol: Limit how much you have to 0-2 drinks a day. Know how much alcohol is in your drink. In the [place] one drink equals one 12 oz bottle of beer (355 mL), one 5 oz glass of wine (148 mL), or one 1 oz glass of hard liquor (44 mL). Lifestyle Do not use any products that contain nicotine or tobacco. These products include cigarettes, chewing tobacco, and vaping devices, such as e-cigarettes. If you need help quitting, ask your health care provider. Do not use street drugs. Do not share needles. Ask your health care provider for help if you need support or information about quitting drugs. General instructions Schedule regular health, dental, and eye exams. Stay current with your vaccines. Tell your health care provider if: You often feel depressed. You have ever been abused or do not feel safe at home. Summary Adopting a healthy lifestyle and getting preventive care are important in promoting health and wellness. Follow your health care provider's instructions about healthy diet, exercising, and getting tested or screened for diseases. Follow your health care provider's instructions on monitoring your cholesterol and blood pressure. This information is not intended to replace advice given to you by your health care provider. Make sure you discuss any questions you have with your health care provider. Document Revised: 05/12/2020 Document Reviewed: 05/12/2020 Elsevier Patient Education  2024 [employer]  

## 2022-12-16 NOTE — Progress Notes (Unsigned)
Subjective:  Patient ID: Jesse Duncan, male    DOB: 1985-12-22  Age: 37 y.o. MRN: [id number]  CC: Annual Exam, Hypertension, and Diabetes   HPI Jesse Duncan presents for a CPX and f/up  ---  He is 2 months s/p bariatric surgery. Post-op labs were significant for anemia and low magnesium.  Discussed the use of AI scribe software for clinical note transcription with the patient, who gave verbal consent to proceed.  History of Present Illness   The patient underwent bariatric surgery at [facility] on October 23rd. Postoperatively, he has experienced difficulty maintaining his blood sugar levels, leading to discontinuation of metformin and insulin. His blood sugar levels have been around 120 regularly, and his A1C is approximately 6.3, which he understands to be prediabetic. He has also discontinued Mounjaro.  The patient reports occasional nausea triggered by the smell of food, but no other significant postoperative complications. He has experienced episodes of blurred vision during workouts, which resolve spontaneously. He denies any chest pain or shortness of breath during these episodes.  The patient has been prescribed medication for his gallbladder to prevent gallstones, despite not having had his gallbladder removed. He is also on carvedilol and hydralazine for blood pressure control but admits to not taking the hydralazine three times a day as prescribed due to forgetfulness. His cardiologist has added another medication to his regimen to assist with blood pressure control.  The patient denies any swelling in his lower extremities.       Outpatient Medications Prior to Visit  Medication Sig Dispense Refill   aspirin EC 325 MG tablet Take 1 tablet (325 mg total) by mouth daily. Swallow whole. Take with Plavix for 90 days, after 90 stop plavix and continue on Aspirin 90 tablet 2   carvedilol (COREG) 12.5 MG tablet Take 1 tablet (12.5 mg total) by mouth 2 (two)  times daily with a meal. 180 tablet 0   Continuous Glucose Receiver (DEXCOM G7 RECEIVER) DEVI 1 Act by Does not apply route daily. 9 each 1   Continuous Glucose Sensor (DEXCOM G7 SENSOR) MISC 1 Act by Does not apply route daily. 9 each 1   diltiazem (CARDIZEM CD) 300 MG 24 hr capsule TAKE 1 CAPSULE BY MOUTH EVERY DAY 90 capsule 1   ezetimibe (ZETIA) 10 MG tablet Take 1 tablet (10 mg total) by mouth daily. 90 tablet 3   glucose blood test strip Use as instructed 100 each 12   hydrALAZINE (APRESOLINE) 25 MG tablet Take 1 tablet (25 mg total) by mouth 3 (three) times daily. 270 tablet 0   Insulin Pen Needle (BD PEN NEEDLE NANO U/F) 32G X 4 MM MISC 1 Act by Subdermal route in the morning, at noon, in the evening, and at bedtime. Use to inject insulin once daily 400 each 1   pantoprazole (PROTONIX) 40 MG tablet Take 1 tablet (40 mg total) by mouth daily. 30 tablet 1   rosuvastatin (CRESTOR) 40 MG tablet Take 1 tablet (40 mg total) by mouth daily. 90 tablet 1   tirzepatide (MOUNJARO) 5 MG/0.5ML Pen INJECT 5 MG SUBCUTANEOUSLY WEEKLY 6 mL 0   valsartan-hydrochlorothiazide (DIOVAN-HCT) 160-12.5 MG tablet Take 1 tablet by mouth daily.     FIASP FLEXTOUCH 100 UNIT/ML FlexTouch Pen  (Patient not taking: Reported on 12/16/2022)     metFORMIN (GLUCOPHAGE) 500 MG tablet Take by mouth. (Patient not taking: Reported on 12/16/2022)     metFORMIN (GLUCOPHAGE-XR) 750 MG 24 hr tablet Take 1 tablet (750 mg  total) by mouth daily with breakfast. (Patient not taking: Reported on 12/16/2022) 90 tablet 1   TOUJEO MAX SOLOSTAR 300 UNIT/ML Solostar Pen SMARTSIG:40 Unit(s) SUB-Q Daily (Patient not taking: Reported on 12/16/2022)     No facility-administered medications prior to visit.    ROS Review of Systems  Constitutional:  Negative for appetite change, chills, diaphoresis and fatigue.  HENT: Negative.    Eyes:  Positive for visual disturbance (BV). Negative for photophobia.  Respiratory: Negative.  Negative for  cough, chest tightness, shortness of breath and wheezing.   Cardiovascular:  Negative for chest pain, palpitations and leg swelling.  Gastrointestinal:  Positive for nausea. Negative for anal bleeding, constipation and vomiting.  Genitourinary:  Negative for decreased urine volume and difficulty urinating.  Musculoskeletal: Negative.   Skin: Negative.   Neurological: Negative.  Negative for dizziness and weakness.  Hematological:  Negative for adenopathy. Does not bruise/bleed easily.  Psychiatric/Behavioral: Negative.      Objective:  BP (!) 166/108 (BP Location: Left Arm, Patient Position: Sitting, Cuff Size: Large) Comment: BP (R) 168/106 (L) 166/108 Comment (Cuff Size): thigh cuff  Pulse 63   Temp 98.4 F (36.9 C) (Oral)   Resp 16   Ht 6\' 1"  (1.854 m)   Wt 266 lb (120.7 kg)   SpO2 98%   BMI 35.09 kg/m   BP Readings from Last 3 Encounters:  12/16/22 (!) 166/108  11/15/22 (!) 130/90  09/22/22 (!) 146/96    Wt Readings from Last 3 Encounters:  12/16/22 266 lb (120.7 kg)  11/15/22 267 lb 12.8 oz (121.5 kg)  09/22/22 (!) 311 lb (141.1 kg)    Physical Exam Vitals reviewed.  Constitutional:      Appearance: Normal appearance.  Eyes:     General: No scleral icterus.    Pupils: Pupils are equal, round, and reactive to light.  Cardiovascular:     Rate and Rhythm: Normal rate and regular rhythm.     Heart sounds: No murmur heard. Pulmonary:     Effort: Pulmonary effort is normal.     Breath sounds: No stridor. No wheezing, rhonchi or rales.  Abdominal:     General: Abdomen is flat.     Palpations: There is no mass.     Tenderness: There is no abdominal tenderness. There is no guarding.     Hernia: No hernia is present.  Musculoskeletal:        General: Normal range of motion.     Cervical back: Neck supple.     Right lower leg: No edema.     Left lower leg: No edema.  Lymphadenopathy:     Cervical: No cervical adenopathy.  Skin:    General: Skin is warm and dry.   Neurological:     General: No focal deficit present.     Mental Status: He is alert. Mental status is at baseline.  Psychiatric:        Mood and Affect: Mood normal.        Behavior: Behavior normal.     Lab Results  Component Value Date   WBC 4.9 12/16/2022   HGB 12.4 (L) 12/16/2022   HCT 38.4 (L) 12/16/2022   PLT 319.0 12/16/2022   GLUCOSE 93 12/16/2022   CHOL 163 09/22/2022   TRIG 217.0 (H) 09/22/2022   HDL 40.20 09/22/2022   LDLDIRECT 223.0 04/08/2022   LDLCALC 79 09/22/2022   ALT 37 05/20/2022   AST 30 05/20/2022   NA 140 12/16/2022   K 3.7 12/16/2022  CL 103 12/16/2022   CREATININE 0.88 12/16/2022   BUN 21 12/16/2022   CO2 27 12/16/2022   TSH 1.74 09/22/2022   PSA 1.57 10/18/2012   INR 1.0 05/20/2022   HGBA1C 5.7 12/16/2022   MICROALBUR 1.9 05/03/2022    MR BRAIN WO CONTRAST Result Date: 05/20/2022 CLINICAL DATA:  Neuro deficit, stroke suspected EXAM: MRI HEAD WITHOUT CONTRAST TECHNIQUE: Multiplanar, multiecho pulse sequences of the brain and surrounding structures were obtained without intravenous contrast. COMPARISON:  04/22/2022 FINDINGS: Brain: No evidence of acute or subacute infarct.Increased signal on diffusion-weighted imaging in the left parietooccipital region and left splenium of the corpus callosum, which are without definite ADC correlates and likely reflect T2 shine through. No acute hemorrhage, mass, mass effect, or midline shift. No hydrocephalus or extra-axial collection. Normal pituitary and craniocervical junction. Curvilinear susceptibility is associated with the left parietooccipital infarct, likely petechial hemorrhage. Additional focus of hemosiderin deposition left lentiform nucleus (series 4, image 57), which correlates with a remote lacunar infarct. Additional cortical encephalomalacia and lacunar infarcts in the posterior left frontal lobe (series 3, image 35. Vascular: Normal arterial flow voids. Skull and upper cervical spine: Normal marrow  signal. Sinuses/Orbits: Mucous retention cyst in the left maxillary sinus. Mild mucosal thickening in the ethmoid air cells. No acute finding in the orbits. Other: The mastoid air cells are well aerated. IMPRESSION: No acute intracranial process. No evidence of acute or subacute infarct. Electronically Signed   By: Wiliam Ke M.D.   On: 05/20/2022 17:13   CT HEAD WO CONTRAST Result Date: 05/20/2022 CLINICAL DATA:  Stroke suspected, numbness/tingling of right face and right EXAM: CT HEAD WITHOUT CONTRAST TECHNIQUE: Contiguous axial images were obtained from the base of the skull through the vertex without intravenous contrast. RADIATION DOSE REDUCTION: This exam was performed according to the departmental dose-optimization program which includes automated exposure control, adjustment of the mA and/or kV according to patient size and/or use of iterative reconstruction technique. COMPARISON:  04/22/2022 FINDINGS: Brain: Possible hypodensity in the right anterior temporal lobe (series 3, image 11), although this area is prone to artifact. Hypodensity in the left PCA territory, consistent with expected evolution of the infarcts noted on the 04/22/2022 exam. The previously noted left MCA territory infarcts are less conspicuous and only caused minimal encephalomalacia (series 3, images 21 and 22). No evidence of acute hemorrhage, mass, mass effect, or midline shift. No hydrocephalus or extra-axial fluid collection. Vascular: No hyperdense vessel. Atherosclerotic calcifications in the intracranial carotid and vertebral arteries. Skull: Negative for fracture or focal lesion. Sinuses/Orbits: Small mucous retention cyst in the left maxillary sinus. No acute finding in the orbits. Other: The mastoid air cells are well aerated. IMPRESSION: 1. Possible hypodensity in the right anterior temporal lobe. Although this area is prone to artifact, this could an acute infarct. MRI is recommended for further evaluation. 2. Evolution  of previously noted left MCA and PCA territory infarcts. 3. No acute intracranial hemorrhage. These results were called by telephone at the time of interpretation on 05/20/2022 at 1:52 pm to provider COOPER ROBBINS , who verbally acknowledged these results. Electronically Signed   By: Wiliam Ke M.D.   On: 05/20/2022 13:53    Assessment & Plan:   Primary hypertension- His BP is not well controlled and he is symptomatic. Will discontinue the ARB and upgrade to dyazide. -     Basic metabolic panel; Future -     Aldosterone + renin activity w/ ratio; Future -     Magnesium; Future -  AMB Referral VBCI Care Management -     Triamterene-HCTZ; Take 1 each (1 capsule total) by mouth daily.  Dispense: 90 capsule; Refill: 0  Chronic combined systolic and diastolic heart failure (HCC) -     Basic metabolic panel; Future -     Aldosterone + renin activity w/ ratio; Future -     Triamterene-HCTZ; Take 1 each (1 capsule total) by mouth daily.  Dispense: 90 capsule; Refill: 0  Insulin-requiring or dependent type II diabetes mellitus (HCC)- His blood sugar is well controlled. -     Hemoglobin A1c; Future  Hypomagnesemia -     Magnesium; Future  Deficiency anemia- Will evaluate for vitamin deficiencies. -     Vitamin B1; Future -     Zinc; Future -     Folate; Future -     CBC with Differential/Platelet; Future -     Vitamin B12; Future     Follow-up: Return in about 6 weeks (around 01/27/2023).  Sanda Linger, MD

## 2022-12-17 ENCOUNTER — Telehealth: Payer: Self-pay

## 2022-12-17 LAB — BASIC METABOLIC PANEL
BUN: 21 mg/dL (ref 6–23)
CO2: 27 meq/L (ref 19–32)
Calcium: 9.6 mg/dL (ref 8.4–10.5)
Chloride: 103 meq/L (ref 96–112)
Creatinine, Ser: 0.88 mg/dL (ref 0.40–1.50)
GFR: 110.6 mL/min (ref >60.00)
Glucose, Bld: 93 mg/dL (ref 70–99)
Potassium: 3.7 meq/L (ref 3.5–5.1)
Sodium: 140 meq/L (ref 135–145)

## 2022-12-17 LAB — CBC WITH DIFFERENTIAL/PLATELET
Basophils Absolute: 0 10*3/uL (ref 0.0–0.1)
Basophils Relative: 0.6 % (ref 0.0–3.0)
Eosinophils Absolute: 0.1 10*3/uL (ref 0.0–0.7)
Eosinophils Relative: 2 % (ref 0.0–5.0)
HCT: 38.4 % — ABNORMAL LOW (ref 39.0–52.0)
Hemoglobin: 12.4 g/dL — ABNORMAL LOW (ref 13.0–17.0)
Lymphocytes Relative: 50.2 % — ABNORMAL HIGH (ref 12.0–46.0)
Lymphs Abs: 2.5 10*3/uL (ref 0.7–4.0)
MCHC: 32.3 g/dL (ref 30.0–36.0)
MCV: 81.4 fL (ref 78.0–100.0)
Monocytes Absolute: 0.3 10*3/uL (ref 0.1–1.0)
Monocytes Relative: 6.3 % (ref 3.0–12.0)
Neutro Abs: 2 10*3/uL (ref 1.4–7.7)
Neutrophils Relative %: 40.9 % — ABNORMAL LOW (ref 43.0–77.0)
Platelets: 319 10*3/uL (ref 150.0–400.0)
RBC: 4.72 Mil/uL (ref 4.22–5.81)
RDW: 14.5 % (ref 11.5–15.5)
WBC: 4.9 10*3/uL (ref 4.0–10.5)

## 2022-12-17 LAB — HEMOGLOBIN A1C: Hgb A1c MFr Bld: 5.7 % (ref 4.6–6.5)

## 2022-12-17 LAB — FOLATE: Folate: 10.1 ng/mL (ref >5.9)

## 2022-12-17 LAB — MAGNESIUM: Magnesium: 1.9 mg/dL (ref 1.5–2.5)

## 2022-12-17 LAB — VITAMIN B12: Vitamin B-12: 585 pg/mL (ref 211–911)

## 2022-12-17 MED ORDER — TRIAMTERENE-HCTZ 37.5-25 MG PO CAPS: 1.0000 | ORAL_CAPSULE | Freq: Every day | ORAL | 0 refills | Status: DC

## 2022-12-17 NOTE — Progress Notes (Signed)
   Care Guide Note  12/17/2022 Name: Jesse Duncan MRN: [id number] DOB: 08-02-1985  Referred by: [name], MD Reason for referral : Care Coordination (Outreach to schedule with Pharm d )   Jesse Duncan is a 37 y.o. year old male who is a primary care patient of [name], MD. Emjay Baris was referred to the pharmacist for assistance related to HTN.    Successful contact was made with the patient to discuss pharmacy services including being ready for the pharmacist to call at least 5 minutes before the scheduled appointment time, to have medication bottles and any blood sugar or blood pressure readings ready for review. The patient agreed to meet with the pharmacist via with the pharmacist via telephone visit on (date/time).  01/10/2023  Penne Lash , RMA     [place]  [facility], [facility] Guide  Direct Dial: [phone]  Website: [email or web address]

## 2022-12-22 ENCOUNTER — Other Ambulatory Visit: Payer: Self-pay | Admitting: Internal Medicine

## 2022-12-22 DIAGNOSIS — I5042 Chronic combined systolic (congestive) and diastolic (congestive) heart failure: Secondary | ICD-10-CM

## 2022-12-22 DIAGNOSIS — I10 Essential (primary) hypertension: Secondary | ICD-10-CM

## 2022-12-24 ENCOUNTER — Other Ambulatory Visit: Payer: Self-pay | Admitting: Internal Medicine

## 2022-12-24 LAB — ALDOSTERONE + RENIN ACTIVITY W/ RATIO
ALDO / PRA Ratio: 13.3 {ratio} (ref 0.9–28.9)
Aldosterone: 2 ng/dL
Renin Activity: 0.15 ng/mL/h — ABNORMAL LOW (ref 0.25–5.82)

## 2022-12-24 LAB — VITAMIN B1: Vitamin B1 (Thiamine): 20 nmol/L (ref 8–30)

## 2022-12-24 LAB — ZINC: Zinc: 69 ug/dL (ref 60–130)

## 2022-12-25 ENCOUNTER — Other Ambulatory Visit: Payer: Self-pay | Admitting: Internal Medicine

## 2022-12-25 DIAGNOSIS — I10 Essential (primary) hypertension: Secondary | ICD-10-CM

## 2023-01-05 DIAGNOSIS — E119 Type 2 diabetes mellitus without complications: Secondary | ICD-10-CM | POA: Diagnosis not present

## 2023-01-05 DIAGNOSIS — H524 Presbyopia: Secondary | ICD-10-CM | POA: Diagnosis not present

## 2023-01-10 ENCOUNTER — Other Ambulatory Visit: Payer: BC Managed Care – PPO | Admitting: Pharmacist

## 2023-01-10 VITALS — BP 134/86

## 2023-01-10 DIAGNOSIS — I10 Essential (primary) hypertension: Secondary | ICD-10-CM

## 2023-01-10 DIAGNOSIS — E119 Type 2 diabetes mellitus without complications: Secondary | ICD-10-CM

## 2023-01-10 MED ORDER — MOUNJARO 5 MG/0.5ML ~~LOC~~ SOAJ: 5.0000 mg | SUBCUTANEOUS | 1 refills | Status: DC

## 2023-01-10 NOTE — Progress Notes (Signed)
 01/10/2023 Name: Jesse Duncan MRN: [id number] DOB: 03-Sep-1985  Chief Complaint  Patient presents with   Diabetes   Hypertension   Medication Management    Jesse Duncan is a 38 y.o. year old male who presented for a telephone visit.   They were referred to the pharmacist by their PCP for assistance in managing hypertension.     Subjective:  Care Team: Primary Care Provider: Joshua Debby CROME, MD    Medication Access/Adherence  Current Pharmacy:  [facility] [phone] [address], [place] - [address] [place] [address] Phone: [phone] Fax: [phone]  [facility] [address] [place] [address] Phone: [phone] Fax: [phone]   Patient reports affordability concerns with their medications: No  Patient reports access/transportation concerns to their pharmacy: No  Patient reports adherence concerns with their medications:  Yes    Pt is unclear what to take for diabetes - stopped insulin  due to lows, still taking metformin , wants to continue taking Mounjaro  to help with weight loss but has no refills   Diabetes:  Current medications: metformin   *Not taking Mounjaro  x1 month  Current glucose readings: not checking   Current meal patterns:  Gastric sleeve surgery in October 2024    Current medications: carvedilol  12.5 mg twice daily, hydralazine  25 mg TID, triamterene /hydrochlorothiazide  37.5/25 mg daily  *Notes he has gotten better about taking hydralazine  TID  Patient has a validated, automated, upper arm home BP cuff Current blood pressure readings readings: 134/86 on Friday   Objective:  BP Readings from Last 3 Encounters:  01/07/23 134/86  12/16/22 (!) 166/108  11/15/22 (!) 130/90     Lab Results  Component Value Date   HGBA1C 5.7 12/16/2022    Lab Results  Component Value Date   CREATININE 0.88 12/16/2022   BUN 21 12/16/2022   NA 140 12/16/2022   K  3.7 12/16/2022   CL 103 12/16/2022   CO2 27 12/16/2022    Lab Results  Component Value Date   CHOL 163 09/22/2022   HDL 40.20 09/22/2022   LDLCALC 79 09/22/2022   LDLDIRECT 223.0 04/08/2022   TRIG 217.0 (H) 09/22/2022   CHOLHDL 4 09/22/2022    Medications Reviewed Today     Reviewed by Merceda Lela SAUNDERS, RPH (Pharmacist) on 01/10/23 at 1553  Med List Status: <None>   Medication Order Taking? Sig Documenting Provider Last Dose Status Informant  aspirin  EC 325 MG tablet [id number] Yes Take 1 tablet (325 mg total) by mouth daily. Swallow whole. Take with Plavix  for 90 days, after 90 stop plavix  and continue on Aspirin   Patient taking differently: Take 81 mg by mouth daily.   Raenelle Donalda HERO, MD Taking Active   carvedilol  (COREG ) 12.5 MG tablet [id number] Yes Take 1 tablet (12.5 mg total) by mouth 2 (two) times daily with a meal. Joshua Debby CROME, MD Taking Active   Continuous Glucose Receiver [facility] G7 RECEIVER) DEVI [id number]  1 Act by Does not apply route daily. Joshua Debby CROME, MD  Active   Continuous Glucose Sensor (DEXCOM G7 SENSOR) [place] [id number]  1 Act by Does not apply route daily. Joshua Debby CROME, MD  Active   diltiazem  (CARDIZEM  CD) 300 MG 24 hr capsule [id number] No TAKE 1 CAPSULE BY MOUTH EVERY DAY  Patient not taking: Reported on 01/10/2023   Brien Belvie BRAVO, MD Not Taking Active Self, Pharmacy Records  ezetimibe  (ZETIA ) 10 MG tablet [id number] Yes Take 1 tablet (10 mg  total) by mouth daily. Alvan Ronal BRAVO, MD Taking Active   glucose blood test strip [id number]  Use as instructed Brien Belvie BRAVO, MD  Active Self, Pharmacy Records  hydrALAZINE  (APRESOLINE ) 25 MG tablet [id number] Yes TAKE 1 TABLET BY MOUTH THREE TIMES A DAY Joshua Debby CROME, MD Taking Active   pantoprazole  (PROTONIX ) 40 MG tablet [id number] No Take 1 tablet (40 mg total) by mouth daily.  Patient not taking: Reported on 01/10/2023   Raenelle Donalda HERO, MD Not Taking Active   rosuvastatin  (CRESTOR ) 40 MG  tablet [id number] Yes Take 1 tablet (40 mg total) by mouth daily. Joshua Debby CROME, MD Taking Active   tirzepatide  (MOUNJARO ) 5 MG/0.5ML Pen [id number] No INJECT 5 MG SUBCUTANEOUSLY WEEKLY  Patient not taking: Reported on 01/10/2023   Joshua Debby CROME, MD Not Taking Active   triamterene -hydrochlorothiazide  (DYAZIDE) 37.5-25 MG capsule [id number] Yes Take 1 each (1 capsule total) by mouth daily. Joshua Debby CROME, MD Taking Active               Assessment/Plan:   Diabetes: - Currently controlled, Goal A1c <7%. A1c drastically improved to gial s/p gastric sleeve/weight loss. - Recommend to stop metformin  and continue Mounjaro      Hypertension: - Currently borderline controlled, BP goal <130/80. BP much improved from OV and addition of triamterene /hydrochlorothiazide   - Recommend to continue current BP regimen.  Expect continued weight loss which should help with low BP as well.    Follow Up Plan: 02/21/23  Darrelyn Drum, PharmD, BCPS, CPP Clinical Pharmacist Practitioner [facility] [phone]

## 2023-01-10 NOTE — Patient Instructions (Signed)
 It was a pleasure speaking with you today!  I have sent a refill of Mounjaro  to your pharmacy. Stop taking metformin  and restart Mounjaro .  Continue your blood pressure medications and keep checking BP often. Let us  know if you are getting frequent readings above 140/90.  Feel free to call with any questions or concerns!  Darrelyn Drum, PharmD, BCPS [facility] Clinical Pharmacist [facility] [phone]

## 2023-01-18 LAB — HM DIABETES EYE EXAM

## 2023-01-20 NOTE — Patient Instructions (Signed)
Below is our plan:  Stroke:  acute/subacute infarcts in left MCA and left PCA, likely large vessel disease from left P2 occlusion and severe left M2 stenosis from uncontrolled risk factors  : Residual deficit: intermittent tingling and numbness of right side, right lower field vision loss. Continue aspirin 81mg  and rosuvastatin 40mg  daily for secondary stroke prevention.  Discussed secondary stroke prevention measures and importance of close PCP follow up for aggressive stroke risk factor management. I have gone over the pathophysiology of stroke, warning signs and symptoms, risk factors and their management in some detail with instructions to go to the closest emergency room for symptoms of concern. HTN: BP goal <130/90. Stable on Coreg 12.5mg  BID, hydralazine 25mg  TID and valsartan/hydrochlorothiazide 300-25mg  daily. Please monitor closely. Continue meds and follow up per PCP and cardiology.  HLD: LDL goal <70. Recent LDL 79. Continue rosuvastatin 40mg  daily per PCP.  DMII: A1c goal<7.0. Recent A1c 5.7. Uncontrolled. Continue  per Dr [name]. Please follow up closely with PCP.  Right lower field vision loss: seek ophthalmology evaluation to rule out other possible causes.  Past episode of afib: avoid caffeine. Continue follow up with cardiology.  Combined chronic systolic/diastolic HF: continue coreg and hydralizine per cardiology.  Tobacco use: continue cessation.  OSA: please call cardiology to discuss use of CPAP. Consider repeat testing following weight loss.  Obesity: recently underwent gastric sleeve. Lost 40lbs. Continue to work on healthy lifestyle habits with regular exercise and well balanced diet.   Goals:  1) Maintain strict control of hypertension with blood pressure goal below 130/90 2) Maintain good control of diabetes with hemoglobin A1c goal below 7%  3) Maintain good control of lipids with LDL cholesterol goal below 70 mg/dL.  4) Eat a healthy diet with plenty of whole grains,  cereals, fruits and vegetables, exercise regularly and maintain ideal body weight   Resources: https://[email or web address].biz/  Please make sure you are staying well hydrated. I recommend 50-60 ounces daily. Well balanced diet and regular exercise encouraged. Consistent sleep schedule with 6-8 hours recommended.   Please continue follow up with care team as directed.   Follow up with me in as needed  You may receive a survey regarding today's visit. I encourage you to leave honest feed back as I do use this information to improve patient care. Thank you for seeing me today!

## 2023-01-20 NOTE — Progress Notes (Signed)
[facility] [address] [place]. [place] [address] [phone]       HOSPITAL FOLLOW UP NOTE  [name] Date of Birth:  15-Feb-1985 Medical Record Number:  [id number]   Reason for Referral:  hospital stroke follow up  I connected with  [name] on 01/26/23 by a video enabled telemedicine application and verified that I am speaking with the correct person using two identifiers.   I discussed the limitations of evaluation and management by telemedicine. The patient expressed understanding and agreed to proceed.   Patient is located at his place of residence. Provider is working remotely from home due to inclement weather.    SUBJECTIVE:   CHIEF COMPLAINT:  No chief complaint on file.   HPI:   Jesse Duncan is a 38 y.o. who  has a past medical history of Allergic rhinitis due to pollen (07/06/2010), Atrial fibrillation (HCC) (08/15/2015), Diabetes mellitus without complication (HCC), Dyslipidemia, Hyperlipidemia (05/25/2010), Hypertension, Impaired fasting blood sugar (08/15/2015), Obesity, OSA (obstructive sleep apnea) (10/20/2015), Prediabetes, Sleep apnea, Smoker, and Tobacco use disorder (11/14/2013).  Patient presented on 04/22/2022 for sudden onset of slurred speech, right upper ext weakness and numbness as well as vision disturbance. MRI revealed acute/subacute infarcts in left MCA and left PCA with minimal petechial hemorrhage and cytotoxic edema in left occipital lobe. CTA with left P2 PCA occlusion, severe left M2 stenosis, severe left intradural vertebral artery stenosis, moderate right M1 stenosis. He was started on asa 325mg  and Plavix for 3 months then asa 81mg  daily. He was discharged home 04/23/2022. On 05/20/2022, he returned to ER with right sided facial and right calf numbness. He reports upon waking, he had right middle face numbness and right calf numbness. Symptoms improved over 2 hours and by ER eval, right calf numbness and  resolved. Facial numbness continued. CT and MRI unremarkable and he was discharged home. Personally reviewed hospitalization pertinent progress notes, lab work and imaging.  Evaluated by Roda Shutters.   Since discharge, he reports doing failry well.   He was seen by Dr [name] 05/03/22. OT and ST referrals placed. Repeat ECHO ordered. EF 50-55%. He continues to have intermittent numbness of right sided facial, arm and leg. Symptoms wax and wane.  Speech is mostly back to baseline. He reports stuttering at times when he talks fast. Speech therapy has released him. He continues to work with PT. NO weakness.   BP has been up and down. He reports usual readings are around 155/90. CBGs are running around 160-175. He has a continuous glucose monitor. He continues metformin, Rybelsus, Toujeo and Fiasp. He stopped smoking at discharge. He has had trouble controlling his appetite since smoking cessation. He is eating more pretzels which he knows are higher in sodium. BP is usually arund 150-160 at home. He continues aspirin 325mg  and Plavix. He continues rosuvastatin 40mg  daily and is tolerating well.   He has a remote history of afib in setting of excessive caffeine use. He was cardioverted and started on Xarelto but was discontinued. He reports 30 day monitor was unremarkable.    He has a history of OSA. He does not use CPAP regularly. Dr Wyline Mood, cardiology, encouraged him to resume when seen 05/11/2022.   He was seen by Dr Lowell Guitar with bariatrics on 06/28/2022. He is being considered for surgery.    UPDATE 01/26/2023 ALL ([facility]): [name] returns for follow up for CVA 04/2022. He reports doing well. No new stroke like symptoms. He does have periodic numbness and tingling of  right arm and face that comes and goes. He continues close follow up with his care team. He underwent gastric sleeve 10/2021. He has lost about 40lbs. A1C now 5.7. Metformin was stopped. BP has been around 130s/90s. He was started on hydralazine.  Diltiazem discontinued. No concerns of irregular heart rhythms. He continues rosuvastatin and asa 81mg  daily. He has not used CPAP recently. He feels pressures are too strong.     PERTINENT IMAGING/LABS  MRI  Positive for acute to early subacute large or medium-sized vessel type infarcts in both the Left MCA and Left PCA territories. Cytotoxic edema with minor petechial hemorrhage in the left occipital lobe. No malignant hemorrhagic transformation or intracranial mass effect. CTA head & neck Occlusion of the distal left P2 PCA. Severe left M2 MCA stenosis. Severe proximal left intradural vertebral artery stenosis. Moderate right M1 MCA stenosis. 2D Echo EF 40 to 45%   A1C Lab Results  Component Value Date   HGBA1C 5.7 12/16/2022    Lipid Panel     Component Value Date/Time   CHOL 163 09/22/2022 1438   CHOL 298 (H) 09/28/2021 0953   TRIG 217.0 (H) 09/22/2022 1438   HDL 40.20 09/22/2022 1438   HDL 44 09/28/2021 0953   CHOLHDL 4 09/22/2022 1438   VLDL 43.4 (H) 09/22/2022 1438   LDLCALC 79 09/22/2022 1438   LDLCALC 208 (H) 09/28/2021 0953   LDLDIRECT 223.0 04/08/2022 1533   LABVLDL 46 (H) 09/28/2021 0953      ROS:   14 system review of systems performed and negative with exception of those listed in HPI  PMH:  Past Medical History:  Diagnosis Date   Allergic rhinitis due to pollen 07/06/2010   Atrial fibrillation (HCC) 08/15/2015   Diabetes mellitus without complication (HCC)    Dyslipidemia    Hyperlipidemia 05/25/2010   Hypertension    Impaired fasting blood sugar 08/15/2015   Obesity    OSA (obstructive sleep apnea) 10/20/2015   Prediabetes    Sleep apnea    Smoker    Tobacco use disorder 11/14/2013    PSH:  Past Surgical History:  Procedure Laterality Date   BREAST SURGERY      Social History:  Social History   Socioeconomic History   Marital status: Married    Spouse name: Not on file   Number of children: Not on file   Years of education: Not on file    Highest education level: Not on file  Occupational History   Not on file  Tobacco Use   Smoking status: Every Day    Types: Cigars    Passive exposure: Past   Smokeless tobacco: Never  Vaping Use   Vaping status: Former  Substance and Sexual Activity   Alcohol use: Yes    Comment: occ   Drug use: Never   Sexual activity: Yes    Partners: Female  Other Topics Concern   Not on file  Social History Narrative   ** Merged History Encounter **       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      Social Drivers of Corporate investment banker Strain: Not on file  Food Insecurity: No Food Insecurity (04/26/2022)   Hunger Vital Sign    Worried About Running Out of Food in the Last Year: Never true    Ran Out of Food in the Last Year: Never true  Transportation Needs: No Transportation Needs (04/26/2022)   PRAPARE - Transportation  Lack of Transportation (Medical): No    Lack of Transportation (Non-Medical): No  Physical Activity: Not on file  Stress: Not on file  Social Connections: Not on file  Intimate Partner Violence: Not At Risk (04/22/2022)   Humiliation, Afraid, Rape, and Kick questionnaire    Fear of Current or Ex-Partner: No    Emotionally Abused: No    Physically Abused: No    Sexually Abused: No    Family History:  Family History  Problem Relation Age of Onset   Hypertension Mother    Healthy Father    COPD Father    Stroke Maternal Grandmother    Cancer Maternal Grandfather    Heart disease Neg Hx     Medications:   Current Outpatient Medications on File Prior to Visit  Medication Sig Dispense Refill   aspirin EC 325 MG tablet Take 1 tablet (325 mg total) by mouth daily. Swallow whole. Take with Plavix for 90 days, after 90 stop plavix and continue on Aspirin (Patient taking differently: Take 81 mg by mouth daily.) 90 tablet 2   carvedilol (COREG) 12.5 MG tablet Take 1 tablet (12.5 mg total) by mouth 2 (two) times daily with a meal. 180  tablet 0   Continuous Glucose Receiver (DEXCOM G7 RECEIVER) [place] 1 Act by Does not apply route daily. 9 each 1   Continuous Glucose Sensor (DEXCOM G7 SENSOR) [place] 1 Act by Does not apply route daily. 9 each 1   diltiazem (CARDIZEM CD) 300 MG 24 hr capsule TAKE 1 CAPSULE BY MOUTH EVERY DAY (Patient not taking: Reported on 01/10/2023) 90 capsule 1   ezetimibe (ZETIA) 10 MG tablet Take 1 tablet (10 mg total) by mouth daily. 90 tablet 3   glucose blood test strip Use as instructed 100 each 12   hydrALAZINE (APRESOLINE) 25 MG tablet TAKE 1 TABLET BY MOUTH THREE TIMES A DAY 270 tablet 0   pantoprazole (PROTONIX) 40 MG tablet Take 1 tablet (40 mg total) by mouth daily. (Patient not taking: Reported on 01/10/2023) 30 tablet 1   rosuvastatin (CRESTOR) 40 MG tablet Take 1 tablet (40 mg total) by mouth daily. 90 tablet 1   tirzepatide (MOUNJARO) 5 MG/0.5ML Pen Inject 5 mg into the skin once a week. 6 mL 1   triamterene-hydrochlorothiazide (DYAZIDE) 37.5-25 MG capsule Take 1 each (1 capsule total) by mouth daily. 90 capsule 0   No current facility-administered medications on file prior to visit.    Allergies:  No Known Allergies    OBJECTIVE:      12/16/2022    4:02 PM  Depression screen PHQ 2/9  Decreased Interest 0  Down, Depressed, Hopeless 0  PHQ - 2 Score 0     General: well developed, well nourished, seated, in no evident distress Head: head normocephalic and atraumatic.      ASSESSMENT: Colan Hyppolite is a 38 y.o. year old male presented on 04/22/2022 for sudden onset of slurred speech, right upper ext weakness and numbness as well as vision disturbance.. Vascular risk factors include HTN, HLD, DMT2, Tobacco use, hx afib, obesity, OSA.    PLAN:  Stroke:  acute/subacute infarcts in left MCA and left PCA, likely large vessel disease from left P2 occlusion and severe left M2 stenosis from uncontrolled risk factors  : Residual deficit: intermittent tingling and numbness of right side,  right lower field vision loss. Continue aspirin 81mg  and rosuvastatin 40mg  daily for secondary stroke prevention.  Discussed secondary stroke prevention measures and importance of close PCP  follow up for aggressive stroke risk factor management. I have gone over the pathophysiology of stroke, warning signs and symptoms, risk factors and their management in some detail with instructions to go to the closest emergency room for symptoms of concern. HTN: BP goal <130/90. Stable on Coreg 12.5mg  BID, hydralazine 25mg  TID and valsartan/hydrochlorothiazide 300-25mg  daily. Please monitor closely. Continue meds and follow up per PCP and cardiology.  HLD: LDL goal <70. Recent LDL 79. Continue rosuvastatin 40mg  daily per PCP.  DMII: A1c goal<7.0. Recent A1c 5.7. Uncontrolled. Continue  per Dr [name]. Please follow up closely with PCP.  Right lower field vision loss: seek ophthalmology evaluation to rule out other possible causes.  Past episode of afib: avoid caffeine. Continue follow up with cardiology.  Combined chronic systolic/diastolic HF: continue coreg and hydralizine per cardiology.  Tobacco use: continue cessation.  OSA: please call cardiology to discuss use of CPAP. Consider repeat testing following weight loss.  Obesity: recently underwent gastric sleeve. Lost 40lbs. Continue to work on healthy lifestyle habits with regular exercise and well balanced diet.    Follow up as needed   CC:  [facility] provider: Dr. Pearlean Brownie PCP: [name], MD    I spent 30 minutes of face-to-face and non-face-to-face time with patient.  This included previsit chart review including review of recent hospitalization, lab review, study review, order entry, electronic health record documentation, patient education regarding recent stroke including etiology, secondary stroke prevention measures and importance of managing stroke risk factors, residual deficits and typical recovery time and answered all other questions to patient  satisfaction     Shawnie Dapper, [facility]  [facility] [address] [place], [place] [address]  Phone [phone] Fax [phone] Note: This document was prepared with digital dictation and possible smart phrase technology. Any transcriptional errors that result from this process are unintentional.

## 2023-01-26 ENCOUNTER — Telehealth: Payer: BC Managed Care – PPO | Admitting: Family Medicine

## 2023-01-26 ENCOUNTER — Encounter: Payer: Self-pay | Admitting: Family Medicine

## 2023-01-26 DIAGNOSIS — I63313 Cerebral infarction due to thrombosis of bilateral middle cerebral arteries: Secondary | ICD-10-CM | POA: Diagnosis not present

## 2023-02-01 DIAGNOSIS — Z713 Dietary counseling and surveillance: Secondary | ICD-10-CM | POA: Diagnosis not present

## 2023-02-01 DIAGNOSIS — E669 Obesity, unspecified: Secondary | ICD-10-CM | POA: Diagnosis not present

## 2023-02-01 DIAGNOSIS — Z903 Acquired absence of stomach [part of]: Secondary | ICD-10-CM | POA: Diagnosis not present

## 2023-02-15 ENCOUNTER — Encounter (HOSPITAL_COMMUNITY): Payer: Self-pay

## 2023-02-15 ENCOUNTER — Other Ambulatory Visit: Payer: Self-pay

## 2023-02-15 ENCOUNTER — Ambulatory Visit (HOSPITAL_COMMUNITY): Payer: MEDICAID

## 2023-02-15 ENCOUNTER — Emergency Department
Admission: EM | Admit: 2023-02-15 | Discharge: 2023-02-15 | Disposition: A | Discharge status: Home / Self Care | Payer: MEDICAID | Attending: Medical | Admitting: Medical

## 2023-02-15 ENCOUNTER — Emergency Department (HOSPITAL_COMMUNITY): Payer: MEDICAID

## 2023-02-15 DIAGNOSIS — L02511 Cutaneous abscess of right hand: Secondary | ICD-10-CM | POA: Insufficient documentation

## 2023-02-15 DIAGNOSIS — Z978 Presence of other specified devices: Secondary | ICD-10-CM

## 2023-02-15 DIAGNOSIS — M79644 Pain in right finger(s): Secondary | ICD-10-CM

## 2023-02-15 DIAGNOSIS — M7989 Other specified soft tissue disorders: Secondary | ICD-10-CM

## 2023-02-15 LAB — CBC WITH DIFF
BASOPHIL #: <0.1 10*3/uL (ref <=0.20)
BASOPHIL %: 0.3 %
EOSINOPHIL #: <0.1 10*3/uL (ref <=0.50)
EOSINOPHIL %: 0.9 %
HCT: 45.9 % (ref 38.9–52.0)
HGB: 15.7 g/dL (ref 13.4–17.5)
IMMATURE GRANULOCYTE #: <0.1 10*3/uL (ref <0.10)
IMMATURE GRANULOCYTE %: 0.2 % (ref 0.0–1.0)
LYMPHOCYTE #: 1.95 10*3/uL (ref 1.00–4.80)
LYMPHOCYTE %: 22.6 %
MCH: 28.9 pg (ref 26.0–32.0)
MCHC: 34.2 g/dL (ref 31.0–35.5)
MCV: 84.4 fL (ref 78.0–100.0)
MONOCYTE #: 1.01 10*3/uL (ref 0.20–1.10)
MONOCYTE %: 11.7 %
MPV: 9.8 fL (ref 8.7–12.5)
NEUTROPHIL #: 5.52 10*3/uL (ref 1.50–7.70)
NEUTROPHIL %: 64.3 %
PLATELETS: 275 10*3/uL (ref 150–400)
RBC: 5.44 10*6/uL (ref 4.50–6.10)
RDW-CV: 12.4 % (ref 11.5–15.5)
WBC: 8.6 10*3/uL (ref 3.7–11.0)

## 2023-02-15 LAB — BASIC METABOLIC PANEL
ANION GAP: 9 mmol/L (ref 4–13)
BUN/CREA RATIO: 12 (ref 6–22)
BUN: 13 mg/dL (ref 8–25)
CALCIUM: 9.7 mg/dL (ref 8.6–10.2)
CHLORIDE: 111 mmol/L (ref 96–111)
CO2 TOTAL: 21 mmol/L — ABNORMAL LOW (ref 22–30)
CREATININE: 1.1 mg/dL (ref 0.75–1.35)
ESTIMATED GFR - MALE: 89 mL/min/BSA (ref >=60)
GLUCOSE: 109 mg/dL (ref 65–125)
POTASSIUM: 3.9 mmol/L (ref 3.5–5.1)
SODIUM: 141 mmol/L (ref 136–145)

## 2023-02-15 LAB — C-REACTIVE PROTEIN(CRP),INFLAMMATION: CRP INFLAMMATION: 12.9 mg/L — ABNORMAL HIGH (ref <8.0)

## 2023-02-15 LAB — SEDIMENTATION RATE: ERYTHROCYTE SEDIMENTATION RATE (ESR): 14 mm/h (ref 0–15)

## 2023-02-15 MED ORDER — DOXYCYCLINE HYCLATE 100 MG CAPSULE
100.0000 mg | ORAL_CAPSULE | Freq: Two times a day (BID) | ORAL | 0 refills | Status: AC
Start: 2023-02-15 — End: 2023-02-22

## 2023-02-15 MED ORDER — KETOROLAC 30 MG/ML (1 ML) INJECTION SOLUTION
30.0000 mg | INTRAMUSCULAR | Status: AC
Start: 2023-02-15 — End: 2023-02-15
  Administered 2023-02-15: 30 mg via INTRAVENOUS
  Filled 2023-02-15: qty 1

## 2023-02-15 MED ORDER — FENTANYL (PF) 50 MCG/ML INJECTION SOLUTION
50.0000 ug | INTRAMUSCULAR | Status: AC
Start: 2023-02-15 — End: 2023-02-15
  Administered 2023-02-15: 50 ug via INTRAVENOUS
  Filled 2023-02-15: qty 2

## 2023-02-15 NOTE — ED Nurses Note (Signed)
Reviewed AVS with patient and family. Reviewed and given prescriptions and medication handouts. Reviewed and given educational handouts regarding diagnosis. Patient and family verbalized understanding of all discharge teaching. Encouraged to ask questions throughout education. PIV discontinued, pressure dressing applied, no bleeding at the site. Patient ambulatory and discharged with belongings.

## 2023-02-15 NOTE — ED Nurses Note (Signed)
Patient taken to [place].

## 2023-02-15 NOTE — ED Nurses Note (Signed)
Report received. Care transferred at this time.

## 2023-02-15 NOTE — Procedures (Signed)
 I&D    Date/Time: 02/15/2023 7:55 AM    Performed by: Lennie Muckle, PA-C  Authorized by: Lennie Muckle, PA-C    Consent:     Consent obtained:  Verbal    Consent given by:  Patient    Risks, benefits, and alternatives were discussed: yes      Risks discussed:  Bleeding, incomplete drainage, pain and infection    Alternatives discussed:  No treatment  Location:     Type:  Abscess    Size:  2 cm    Location:  Upper extremity    Upper extremity location:  Finger    Finger location:  R long finger  Pre-procedure details:     Skin preparation:  Chlorhexidine with alcohol  Sedation:     Sedation type:  None  Anesthesia:     Anesthesia method:  None  Procedure type:     Complexity:  Simple  Procedure details:     Ultrasound guidance: no      Needle aspiration: no      Incision types:  Stab incision    Incision depth:  Dermal    Drainage:  Purulent and bloody    Drainage amount:  Scant    Wound treatment:  Wound left open    Packing materials:  None  Post-procedure details:     Procedure completion:  Tolerated      Lennie Muckle, PA-C   2/11/202507:55    I was present and available for consultation. This does not indicate that I personally evaluated the patient.   Lynelle Doctor, MD

## 2023-02-15 NOTE — ED Provider Notes (Signed)
 [name] - Emergency Department  ED Primary Provider Note  History of Present Illness   Chief Complaint   Patient presents with    Finger Pain     Onset 2 days ago, noticed swelling to medial R middle finger, tingling at times to digit, denies fall or injury     [name] is a 38 y.o. male who had concerns including Finger Pain.  Arrival: The patient arrived by Car    HPI    38 yr old patient, presenting due to pain and swelling of his R middle finger.  Pt states he noticed the swelling 3 days ago. Has been soaking in hot water, but area getting worse.  Pt had similar abscess like area (felon) on his L hand in Nov of 24, but area spontaneously drained prior to going to the OR.   He is unsure of any specific injury. 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    Pt currently on subutex. Denies any injections into the hand.  Denies fevers or chills.   NKDA    History Reviewed This Encounter:     Physical Exam   ED Triage Vitals   BP (Non-Invasive) 02/15/23 0541 (!) 168/95   Heart Rate 02/15/23 0541 (!) 107   Respiratory Rate 02/15/23 0541 18   Temperature 02/15/23 0541 36.4 C (97.5 F)   SpO2 02/15/23 0541 100 %   Weight 02/15/23 0542 85.7 kg (188 lb 15 oz)   Height 02/15/23 0542 1.753 m (5\' 9" )     Physical Exam  Constitutional:       Appearance: Normal appearance. He is not ill-appearing.   Cardiovascular:      Rate and Rhythm: Normal rate and regular rhythm.   Pulmonary:      Effort: Pulmonary effort is normal.      Breath sounds: Normal breath sounds.   Skin:     General: Skin is warm.      Findings: Lesion present.      Comments: R 3ed finger: large fluctuant area noted on palmar side, overlying the middle phalange. Extremely tender to touch. Decreased ROM -both extension and flexion due to pain and swelling.     Neurological:      General: No focal deficit present.      Mental Status: He is alert and oriented to person, place, and time.   Psychiatric:          Mood and Affect: Mood normal.         Behavior: Behavior normal.         Thought Content: Thought content normal.         Judgment: Judgment normal.       Patient Data   Labs Ordered/Reviewed - No data to display  XR HAND RIGHT   Preliminary Result by Edi, Radresults In (02/11 0556)   Soft tissue swelling without evidence of acute osseous abnormality.           Medical Decision Making       Medical Decision Making  Amount and/or Complexity of Data Reviewed  Labs: ordered.    Risk  Prescription drug management.  Parenteral controlled substances.               Clinical Impression   Abscess of finger, right (Primary)       Pt was evaluated in [facility]  Xray done, labs drawn  Pt sent for [place] of finger to evaluate for  tenosynovitis.  Results currently pending at the end of my shift.   Pt was signed out to L Didio PAC in stable condition at the end of my shift.       The co-signing faculty was physically present and available for consultation and did not physically see this patient.  Delsa Sale, PA-C  02/16/2023, 03:57

## 2023-02-15 NOTE — Progress Notes (Signed)
[facility] Emergency Department        Clinical Impression   Abscess of finger, right (Primary)     Chief Complaint   Patient presents with    Finger Pain     Onset 2 days ago, noticed swelling to medial R middle finger, tingling at times to digit, denies fall or injury     Progress Note:     Patient taken over at shift change. Patient reports needing discharge for a work requirement or risk getting terminated. Patient was encouraged to stay but was adamant that he must leave and would return. Discussed risks of leaving and benefits of staying, patient demonstrated understanding. I&D performed, see procedure note. Once again emphasized importance of patient returning to ED, patient demonstrated understanding and endorsed plan to return. Vital signs stable on discharge.     Disposition: Discharged      Parts of this patient's chart may be completed in a retrospective fashion due to simultaneous direct patient care activities in the Emergency Department.   This note was partially generated using MModal Fluency Direct system, and there may be some incorrect words, spellings, and punctuation that were not noted in checking the note before saving.        Lennie Muckle, PA-C  The co-signing faculty was physically present in ED/Urgent Care/Student Health and available for consultation and did not particpate in the care of this patient.@  Lennie Muckle, PA-C  02/15/2023, 10:37

## 2023-02-15 NOTE — ED Nurses Note (Addendum)
Patient IV removed

## 2023-02-15 NOTE — ED Nurses Note (Signed)
Pt presents to the ED with c/o right middle finger swelling for 2 days. Pt denies injury/fall, endorses intermittent tingling in the affected finger. Upon arrival pt is alert and oriented, pt now resting in bed, call light in reach

## 2023-02-21 ENCOUNTER — Other Ambulatory Visit: Payer: BC Managed Care – PPO | Admitting: Pharmacist

## 2023-02-21 DIAGNOSIS — Z794 Long term (current) use of insulin: Secondary | ICD-10-CM

## 2023-02-21 MED ORDER — TIRZEPATIDE 7.5 MG/0.5ML ~~LOC~~ SOAJ
7.5000 mg | SUBCUTANEOUS | 2 refills | Status: DC
Start: 2023-02-21 — End: 2023-04-04

## 2023-02-21 NOTE — Progress Notes (Unsigned)
02/21/2023 Name: Manolo Bosket MRN: [id number] DOB: 1985-03-10  Chief Complaint  Patient presents with   Diabetes   Hypertension   Medication Management    Whitaker Holderman is a 38 y.o. year old male who presented for a telephone visit.   They were referred to the pharmacist by their PCP for assistance in managing hypertension.   BG none over 110, between 80-110. Stable weight. Mounjaro increase to 7.5 mg  137/91   Subjective:  Care Team: Primary Care Provider: [name], MD    Medication Access/Adherence  Current Pharmacy[facility]  [phone], [place] - [address] [place] [address] Phone: [phone] Fax: [phone]  [facility] [address] [place] [address] Phone: [phone] Fax: [phone]   Patient reports affordability concerns with their medications: No  Patient reports access/transportation concerns to their pharmacy: No  Patient reports adherence concerns with their medications:  Yes    Pt is unclear what to take for diabetes - stopped insulin due to lows, still taking metformin, wants to continue taking Mounjaro to help with weight loss but has no refills   Diabetes:  Current medications: metformin  *Not taking Mounjaro x1 month  Current glucose readings: not checking   Current meal patterns:  Gastric sleeve surgery in October 2024    Current medications: carvedilol 12.5 mg twice daily, hydralazine 25 mg TID, triamterene/hydrochlorothiazide 37.5/25 mg daily  *Notes he has gotten better about taking hydralazine TID  Patient has a validated, automated, upper arm home BP cuff Current blood pressure readings readings: 134/86 on Friday   Objective:  BP Readings from Last 3 Encounters:  01/07/23 134/86  12/16/22 (!) 166/108  11/15/22 (!) 130/90     Lab Results  Component Value Date   HGBA1C 5.7 12/16/2022    Lab Results  Component  Value Date   CREATININE 0.88 12/16/2022   BUN 21 12/16/2022   NA 140 12/16/2022   K 3.7 12/16/2022   CL 103 12/16/2022   CO2 27 12/16/2022    Lab Results  Component Value Date   CHOL 163 09/22/2022   HDL 40.20 09/22/2022   LDLCALC 79 09/22/2022   LDLDIRECT 223.0 04/08/2022   TRIG 217.0 (H) 09/22/2022   CHOLHDL 4 09/22/2022    Medications Reviewed Today     Reviewed by Bonita Quin, RPH (Pharmacist) on 02/21/23 at 1633  Med List Status: <None>   Medication Order Taking? Sig Documenting Provider Last Dose Status Informant  aspirin EC 325 MG tablet [id number]  Take 1 tablet (325 mg total) by mouth daily. Swallow whole. Take with Plavix for 90 days, after 90 stop plavix and continue on Aspirin  Patient taking differently: Take 81 mg by mouth daily.   Maretta Bees, MD  Active   carvedilol (COREG) 12.5 MG tablet [id number]  Take 1 tablet (12.5 mg total) by mouth 2 (two) times daily with a meal. [name], MD  Active   Continuous Glucose Receiver [facility] G7 RECEIVER) DEVI [id number]  1 Act by Does not apply route daily. [name], MD  Active   Continuous Glucose Sensor (DEXCOM G7 SENSOR) [place] [id number]  1 Act by Does not apply route daily. [name], MD  Active   ezetimibe (ZETIA) 10 MG tablet [id number]  Take 1 tablet (10 mg total) by mouth daily. Maisie Fus, MD  Expired 02/13/23 2359   glucose blood test strip [id number]  Use as  instructed Storm Frisk, MD  Active Self, Pharmacy Records  hydrALAZINE (APRESOLINE) 25 MG tablet [id number]  TAKE 1 TABLET BY MOUTH THREE TIMES A DAY [name], MD  Active   rosuvastatin (CRESTOR) 40 MG tablet [id number]  Take 1 tablet (40 mg total) by mouth daily. [name], MD  Active   tirzepatide [facility]) 5 MG/0.5ML Pen [id number] Yes Inject 5 mg into the skin once a week. [name], MD Taking Active   triamterene-hydrochlorothiazide (DYAZIDE) 37.5-25 MG capsule [id number]  Take 1 each (1 capsule total) by  mouth daily. [name], MD  Active               Assessment/Plan:   Diabetes: - Currently controlled, Goal A1c <7%. A1c drastically improved to gial s/p gastric sleeve/weight loss. - Recommend to stop metformin and continue Mounjaro     Hypertension: - Currently borderline controlled, BP goal <130/80. BP much improved from OV and addition of triamterene/hydrochlorothiazide  - Recommend to continue current BP regimen.  Expect continued weight loss which should help with low BP as well.    Follow Up Plan: 02/21/23  Arbutus Leas, PharmD, BCPS, CPP Clinical Pharmacist Practitioner [place] [facility] [phone]

## 2023-02-23 NOTE — Patient Instructions (Signed)
It was a pleasure speaking with you today!  Increase Mounjaro to 7.5 mg weekly.  Feel free to call with any questions or concerns!  Arbutus Leas, PharmD, BCPS, CPP Clinical Pharmacist Practitioner [place] [facility] at [facility] [phone]

## 2023-03-19 ENCOUNTER — Other Ambulatory Visit: Payer: Self-pay | Admitting: Internal Medicine

## 2023-03-19 DIAGNOSIS — I10 Essential (primary) hypertension: Secondary | ICD-10-CM

## 2023-03-19 DIAGNOSIS — I5042 Chronic combined systolic (congestive) and diastolic (congestive) heart failure: Secondary | ICD-10-CM

## 2023-03-22 ENCOUNTER — Other Ambulatory Visit: Payer: Self-pay | Admitting: Internal Medicine

## 2023-03-22 DIAGNOSIS — E119 Type 2 diabetes mellitus without complications: Secondary | ICD-10-CM

## 2023-03-28 DIAGNOSIS — F4320 Adjustment disorder, unspecified: Secondary | ICD-10-CM | POA: Diagnosis not present

## 2023-03-31 ENCOUNTER — Other Ambulatory Visit: Payer: Self-pay | Admitting: Internal Medicine

## 2023-03-31 DIAGNOSIS — E1169 Type 2 diabetes mellitus with other specified complication: Secondary | ICD-10-CM

## 2023-04-01 MED ORDER — EZETIMIBE 10 MG PO TABS: 10.0000 mg | ORAL_TABLET | Freq: Every day | ORAL | 2 refills | Status: DC

## 2023-04-04 ENCOUNTER — Other Ambulatory Visit: Payer: BC Managed Care – PPO | Admitting: Pharmacist

## 2023-04-04 DIAGNOSIS — Z794 Long term (current) use of insulin: Secondary | ICD-10-CM

## 2023-04-04 MED ORDER — TIRZEPATIDE 10 MG/0.5ML ~~LOC~~ SOAJ: 10.0000 mg | SUBCUTANEOUS | 0 refills | Status: DC

## 2023-04-04 NOTE — Patient Instructions (Signed)
 It was a pleasure speaking with you today!  Increase Mounjaro to 10 mg weekly. Continue monitoring blood sugars using the Dexcom and let us know if you begin have readings under 70 occurring.  Feel free to call with any questions or concerns!  Arbutus Leas, PharmD, BCPS, CPP Clinical Pharmacist Practitioner [place] [facility] at [facility] [phone]

## 2023-04-04 NOTE — Progress Notes (Signed)
   04/04/2023 Name: Jesse Duncan MRN: [id number] DOB: 28-May-1985  Chief Complaint  Patient presents with   Medication Management    Jesse Duncan is a 38 y.o. year old male who presented for a telephone visit.   They were referred to the pharmacist by their PCP for assistance in managing hypertension.   Subjective:  Care Team: Primary Care Provider: [name], MD    Medication Access/Adherence  Current Pharmacy[facility]  [phone], [place] - [address] [place] [address] Phone: [phone] Fax: [phone]  [facility] [address] [place] [address] Phone: [phone] Fax: [phone]   Patient reports affordability concerns with their medications: No  Patient reports access/transportation concerns to their pharmacy: No  Patient reports adherence concerns with their medications:  Yes    Pt is unclear what to take for diabetes - stopped insulin due to lows, still taking metformin, wants to continue taking Mounjaro to help with weight loss but has no refills   Diabetes:  Current medications: Mounjaro 7.5 mg SQ weekly Denies nausea or other side effects with Mounjaro  Current glucose readings: BG none over 110, typically between 80-110. Reports GMI staying 5.6-5.7% on Dexcom. No lows below 89 and typically only occur the day he changes his sensor.    Current meal patterns:  Gastric sleeve surgery in October 2024 Weight has been stable    Current medications: carvedilol 12.5 mg twice daily, hydralazine 25 mg TID, triamterene/hydrochlorothiazide 37.5/25 mg daily  *Notes he has gotten better about taking hydralazine TID  Patient has a validated, automated, upper arm home BP cuff Current blood pressure readings readings: 137/91   Objective:  BP Readings from Last 3 Encounters:  01/07/23 134/86  12/16/22 (!) 166/108  11/15/22 (!) 130/90     Lab Results   Component Value Date   HGBA1C 5.7 12/16/2022    Lab Results  Component Value Date   CREATININE 0.88 12/16/2022   BUN 21 12/16/2022   NA 140 12/16/2022   K 3.7 12/16/2022   CL 103 12/16/2022   CO2 27 12/16/2022    Lab Results  Component Value Date   CHOL 163 09/22/2022   HDL 40.20 09/22/2022   LDLCALC 79 09/22/2022   LDLDIRECT 223.0 04/08/2022   TRIG 217.0 (H) 09/22/2022   CHOLHDL 4 09/22/2022    Medications Reviewed Today   Medications were not reviewed in this encounter       Assessment/Plan:   Diabetes: - Currently controlled, Goal A1c <7%. A1c drastically improved to gial s/p gastric sleeve/weight loss. - Pt would like to trial increase Mounjaro to 10 mg SQ weekly    Hypertension: - Currently borderline controlled, BP goal <130/80. BP much improved from OV and addition of triamterene/hydrochlorothiazide  - Recommend to continue current BP regimen.  Expect continued weight loss which should help with low BP as well. -Has PCP f/u 4/15    Follow Up Plan: PRN  Arbutus Leas, PharmD, BCPS, CPP Clinical Pharmacist Practitioner [place] [facility] [phone]

## 2023-04-16 ENCOUNTER — Other Ambulatory Visit: Payer: Self-pay | Admitting: Internal Medicine

## 2023-04-16 DIAGNOSIS — E119 Type 2 diabetes mellitus without complications: Secondary | ICD-10-CM

## 2023-04-24 ENCOUNTER — Other Ambulatory Visit: Payer: Self-pay | Admitting: Internal Medicine

## 2023-04-24 DIAGNOSIS — I480 Paroxysmal atrial fibrillation: Secondary | ICD-10-CM

## 2023-04-24 DIAGNOSIS — I5042 Chronic combined systolic (congestive) and diastolic (congestive) heart failure: Secondary | ICD-10-CM

## 2023-04-24 DIAGNOSIS — I10 Essential (primary) hypertension: Secondary | ICD-10-CM

## 2023-05-02 ENCOUNTER — Ambulatory Visit (INDEPENDENT_AMBULATORY_CARE_PROVIDER_SITE_OTHER): Admitting: Internal Medicine

## 2023-05-02 ENCOUNTER — Encounter: Payer: Self-pay | Admitting: Internal Medicine

## 2023-05-02 VITALS — BP 136/94 | HR 89 | Temp 98.5°F | Resp 16 | Ht 73.0 in | Wt 238.6 lb

## 2023-05-02 DIAGNOSIS — E7801 Familial hypercholesterolemia: Secondary | ICD-10-CM

## 2023-05-02 DIAGNOSIS — I5042 Chronic combined systolic (congestive) and diastolic (congestive) heart failure: Secondary | ICD-10-CM | POA: Diagnosis not present

## 2023-05-02 DIAGNOSIS — E119 Type 2 diabetes mellitus without complications: Secondary | ICD-10-CM

## 2023-05-02 DIAGNOSIS — Z0001 Encounter for general adult medical examination with abnormal findings: Secondary | ICD-10-CM

## 2023-05-02 DIAGNOSIS — E1169 Type 2 diabetes mellitus with other specified complication: Secondary | ICD-10-CM | POA: Diagnosis not present

## 2023-05-02 DIAGNOSIS — I10 Essential (primary) hypertension: Secondary | ICD-10-CM

## 2023-05-02 DIAGNOSIS — Z0000 Encounter for general adult medical examination without abnormal findings: Secondary | ICD-10-CM

## 2023-05-02 DIAGNOSIS — E785 Hyperlipidemia, unspecified: Secondary | ICD-10-CM

## 2023-05-02 DIAGNOSIS — Z794 Long term (current) use of insulin: Secondary | ICD-10-CM | POA: Diagnosis not present

## 2023-05-02 DIAGNOSIS — I480 Paroxysmal atrial fibrillation: Secondary | ICD-10-CM

## 2023-05-02 LAB — BASIC METABOLIC PANEL WITH GFR
BUN: 20 mg/dL (ref 6–23)
CO2: 33 meq/L — ABNORMAL HIGH (ref 19–32)
Calcium: 9.9 mg/dL (ref 8.4–10.5)
Chloride: 101 meq/L (ref 96–112)
Creatinine, Ser: 1.01 mg/dL (ref 0.40–1.50)
GFR: 95.4 mL/min (ref >60.00)
Glucose, Bld: 91 mg/dL (ref 70–99)
Potassium: 4.1 meq/L (ref 3.5–5.1)
Sodium: 140 meq/L (ref 135–145)

## 2023-05-02 LAB — CBC WITH DIFFERENTIAL/PLATELET
Basophils Absolute: 0 10*3/uL (ref 0.0–0.1)
Basophils Relative: 0.4 % (ref 0.0–3.0)
Eosinophils Absolute: 0.3 10*3/uL (ref 0.0–0.7)
Eosinophils Relative: 6.2 % — ABNORMAL HIGH (ref 0.0–5.0)
HCT: 43.7 % (ref 39.0–52.0)
Hemoglobin: 14.1 g/dL (ref 13.0–17.0)
Lymphocytes Relative: 51.6 % — ABNORMAL HIGH (ref 12.0–46.0)
Lymphs Abs: 2.6 10*3/uL (ref 0.7–4.0)
MCHC: 32.3 g/dL (ref 30.0–36.0)
MCV: 81.8 fl (ref 78.0–100.0)
Monocytes Absolute: 0.4 10*3/uL (ref 0.1–1.0)
Monocytes Relative: 7.6 % (ref 3.0–12.0)
Neutro Abs: 1.8 10*3/uL (ref 1.4–7.7)
Neutrophils Relative %: 34.2 % — ABNORMAL LOW (ref 43.0–77.0)
Platelets: 324 10*3/uL (ref 150.0–400.0)
RBC: 5.34 Mil/uL (ref 4.22–5.81)
RDW: 13.6 % (ref 11.5–15.5)
WBC: 5.1 10*3/uL (ref 4.0–10.5)

## 2023-05-02 LAB — HEPATIC FUNCTION PANEL
ALT: 17 U/L (ref 0–53)
AST: 18 U/L (ref 0–37)
Albumin: 4.6 g/dL (ref 3.5–5.2)
Alkaline Phosphatase: 56 U/L (ref 39–117)
Bilirubin, Direct: 0.1 mg/dL (ref 0.0–0.3)
Total Bilirubin: 0.7 mg/dL (ref 0.2–1.2)
Total Protein: 8 g/dL (ref 6.0–8.3)

## 2023-05-02 LAB — MICROALBUMIN / CREATININE URINE RATIO
Creatinine,U: 211.5 mg/dL
Microalb Creat Ratio: 21.1 mg/g (ref 0.0–30.0)
Microalb, Ur: 4.5 mg/dL — ABNORMAL HIGH (ref 0.0–1.9)

## 2023-05-02 LAB — HEMOGLOBIN A1C: Hgb A1c MFr Bld: 4.7 % (ref 4.6–6.5)

## 2023-05-02 MED ORDER — DEXCOM G7 RECEIVER DEVI: 1.0000 | Freq: Every day | 1 refills | Status: AC

## 2023-05-02 MED ORDER — ROSUVASTATIN CALCIUM 40 MG PO TABS: 40.0000 mg | ORAL_TABLET | Freq: Every day | ORAL | 1 refills | Status: DC

## 2023-05-02 MED ORDER — DEXCOM G7 SENSOR MISC
1.0000 | Freq: Every day | 1 refills | Status: AC
Start: 2023-05-02 — End: ?

## 2023-05-02 MED ORDER — HYDRALAZINE HCL 25 MG PO TABS
25.0000 mg | ORAL_TABLET | Freq: Three times a day (TID) | ORAL | 1 refills | Status: DC
Start: 2023-05-02 — End: 2023-08-23

## 2023-05-02 NOTE — Progress Notes (Unsigned)
 Subjective:  Patient ID: Jesse Duncan, male    DOB: [birth year]  Age: 38 y.o. MRN: [id number]  CC: Annual Exam, Hypertension, and Diabetes   HPI Jesse Duncan presents for a CPX and f/up ----    Outpatient Medications Prior to Visit  Medication Sig Dispense Refill   ASPIRIN  PO Take 81 mg by mouth daily.     carvedilol  (COREG ) 12.5 MG tablet Take 1 tablet (12.5 mg total) by mouth 2 (two) times daily with a meal. 180 tablet 0   ezetimibe  (ZETIA ) 10 MG tablet Take 1 tablet (10 mg total) by mouth daily. 90 tablet 2   omeprazole (PRILOSEC) 20 MG capsule Take 20 mg by mouth every morning.     tirzepatide  (MOUNJARO ) 10 MG/0.5ML Pen Inject 10 mg into the skin once a week. 6 mL 0   triamterene -hydrochlorothiazide  (DYAZIDE) 37.5-25 MG capsule TAKE 1 EACH (1 CAPSULE TOTAL) BY MOUTH DAILY. 90 capsule 0   URSODIOL PO Take 250 mg by mouth daily in the afternoon.     Continuous Glucose Receiver (DEXCOM G7 RECEIVER) [place] 1 Act by Does not apply route daily. 9 each 1   Continuous Glucose Sensor (DEXCOM G7 SENSOR) MISC 1 Act by Does not apply route daily. 9 each 1   hydrALAZINE  (APRESOLINE ) 25 MG tablet TAKE 1 TABLET BY MOUTH THREE TIMES A DAY 270 tablet 0   rosuvastatin  (CRESTOR ) 40 MG tablet Take 1 tablet (40 mg total) by mouth daily. 90 tablet 1   glucose blood test strip Use as instructed 100 each 12   No facility-administered medications prior to visit.    ROS Review of Systems  Objective:  BP (!) 136/94 (BP Location: Left Arm, Patient Position: Sitting, Cuff Size: Normal)   Pulse 89   Temp 98.5 F (36.9 C) (Temporal)   Resp 16   Ht 6\' 1"  (1.854 m)   Wt 238 lb 9.6 oz (108.2 kg)   SpO2 98%   BMI 31.48 kg/m   BP Readings from Last 3 Encounters:  05/02/23 (!) 136/94  01/07/23 134/86  12/16/22 (!) 166/108    Wt Readings from Last 3 Encounters:  05/02/23 238 lb 9.6 oz (108.2 kg)  12/16/22 266 lb (120.7 kg)  11/15/22 267 lb 12.8 oz (121.5 kg)    Physical Exam Vitals  reviewed.  Constitutional:      Appearance: Normal appearance.  HENT:     Mouth/Throat:     Mouth: Mucous membranes are moist.  Eyes:     General: No scleral icterus.    Conjunctiva/sclera: Conjunctivae normal.  Cardiovascular:     Rate and Rhythm: Normal rate and regular rhythm.     Heart sounds: No murmur heard.    No friction rub. No gallop.     Comments: EKG- NSR, 72 bpm NS T wave changes No LVH or Q waves. Pulmonary:     Effort: Pulmonary effort is normal.     Breath sounds: No stridor. No wheezing, rhonchi or rales.  Abdominal:     General: Abdomen is flat.     Palpations: There is no mass.     Tenderness: There is no abdominal tenderness. There is no guarding.     Hernia: No hernia is present.  Musculoskeletal:        General: Normal range of motion.     Cervical back: Neck supple.     Right lower leg: No edema.     Left lower leg: No edema.  Lymphadenopathy:     Cervical: No  cervical adenopathy.  Skin:    General: Skin is warm and dry.  Neurological:     General: No focal deficit present.     Mental Status: He is alert. Mental status is at baseline.  Psychiatric:        Mood and Affect: Mood normal.        Behavior: Behavior normal.        Thought Content: Thought content normal.     Lab Results  Component Value Date   WBC 4.9 12/16/2022   HGB 12.4 (L) 12/16/2022   HCT 38.4 (L) 12/16/2022   PLT 319.0 12/16/2022   GLUCOSE 93 12/16/2022   CHOL 163 09/22/2022   TRIG 217.0 (H) 09/22/2022   HDL 40.20 09/22/2022   LDLDIRECT 223.0 04/08/2022   LDLCALC 79 09/22/2022   ALT 37 05/20/2022   AST 30 05/20/2022   NA 140 12/16/2022   K 3.7 12/16/2022   CL 103 12/16/2022   CREATININE 0.88 12/16/2022   BUN 21 12/16/2022   CO2 27 12/16/2022   TSH 1.74 09/22/2022   PSA 1.57 10/18/2012   INR 1.0 05/20/2022   HGBA1C 5.7 12/16/2022   MICROALBUR 1.9 05/03/2022    MR BRAIN WO CONTRAST Result Date: 05/20/2022 CLINICAL DATA:  Neuro deficit, stroke suspected EXAM:  MRI HEAD WITHOUT CONTRAST TECHNIQUE: Multiplanar, multiecho pulse sequences of the brain and surrounding structures were obtained without intravenous contrast. COMPARISON:  04/22/2022 FINDINGS: Brain: No evidence of acute or subacute infarct.Increased signal on diffusion-weighted imaging in the left parietooccipital region and left splenium of the corpus callosum, which are without definite ADC correlates and likely reflect T2 shine through. No acute hemorrhage, mass, mass effect, or midline shift. No hydrocephalus or extra-axial collection. Normal pituitary and craniocervical junction. Curvilinear susceptibility is associated with the left parietooccipital infarct, likely petechial hemorrhage. Additional focus of hemosiderin deposition left lentiform nucleus (series 4, image 57), which correlates with a remote lacunar infarct. Additional cortical encephalomalacia and lacunar infarcts in the posterior left frontal lobe (series 3, image 35. Vascular: Normal arterial flow voids. Skull and upper cervical spine: Normal marrow signal. Sinuses/Orbits: Mucous retention cyst in the left maxillary sinus. Mild mucosal thickening in the ethmoid air cells. No acute finding in the orbits. Other: The mastoid air cells are well aerated. IMPRESSION: No acute intracranial process. No evidence of acute or subacute infarct. Electronically Signed   By: Zoila Hines M.D.   On: 05/20/2022 17:13   CT HEAD WO CONTRAST Result Date: 05/20/2022 CLINICAL DATA:  Stroke suspected, numbness/tingling of right face and right EXAM: CT HEAD WITHOUT CONTRAST TECHNIQUE: Contiguous axial images were obtained from the base of the skull through the vertex without intravenous contrast. RADIATION DOSE REDUCTION: This exam was performed according to the departmental dose-optimization program which includes automated exposure control, adjustment of the mA and/or kV according to patient size and/or use of iterative reconstruction technique. COMPARISON:   04/22/2022 FINDINGS: Brain: Possible hypodensity in the right anterior temporal lobe (series 3, image 11), although this area is prone to artifact. Hypodensity in the left PCA territory, consistent with expected evolution of the infarcts noted on the 04/22/2022 exam. The previously noted left MCA territory infarcts are less conspicuous and only caused minimal encephalomalacia (series 3, images 21 and 22). No evidence of acute hemorrhage, mass, mass effect, or midline shift. No hydrocephalus or extra-axial fluid collection. Vascular: No hyperdense vessel. Atherosclerotic calcifications in the intracranial carotid and vertebral arteries. Skull: Negative for fracture or focal lesion. Sinuses/Orbits: Small mucous retention cyst  in the left maxillary sinus. No acute finding in the orbits. Other: The mastoid air cells are well aerated. IMPRESSION: 1. Possible hypodensity in the right anterior temporal lobe. Although this area is prone to artifact, this could an acute infarct. MRI is recommended for further evaluation. 2. Evolution of previously noted left MCA and PCA territory infarcts. 3. No acute intracranial hemorrhage. These results were called by telephone at the time of interpretation on 05/20/2022 at 1:52 pm to provider COOPER ROBBINS , who verbally acknowledged these results. Electronically Signed   By: Zoila Hines M.D.   On: 05/20/2022 13:53    Assessment & Plan:  Primary hypertension -     hydrALAZINE  HCl; Take 1 tablet (25 mg total) by mouth 3 (three) times daily.  Dispense: 270 tablet; Refill: 1 -     Basic metabolic panel with GFR; Future -     CBC with Differential/Platelet; Future -     EKG 12-Lead  Insulin -requiring or dependent type II diabetes mellitus (HCC) -     Dexcom G7 Sensor; 1 Act by Does not apply route daily.  Dispense: 9 each; Refill: 1 -     HM Diabetes Foot Exam -     Dexcom G7 Receiver; 1 Act by Does not apply route daily.  Dispense: 9 each; Refill: 1 -     Basic metabolic  panel with GFR; Future -     Hemoglobin A1c; Future -     Urinalysis, Routine w reflex microscopic; Future -     Microalbumin / creatinine urine ratio; Future  Chronic combined systolic and diastolic heart failure (HCC) -     hydrALAZINE  HCl; Take 1 tablet (25 mg total) by mouth 3 (three) times daily.  Dispense: 270 tablet; Refill: 1 -     Basic metabolic panel with GFR; Future  Paroxysmal atrial fibrillation (HCC)  Hyperlipidemia associated with type 2 diabetes mellitus (HCC) -     Rosuvastatin  Calcium ; Take 1 tablet (40 mg total) by mouth daily.  Dispense: 90 tablet; Refill: 1 -     Hepatic function panel; Future  Familial hypercholesteremia -     Rosuvastatin  Calcium ; Take 1 tablet (40 mg total) by mouth daily.  Dispense: 90 tablet; Refill: 1 -     Hepatic function panel; Future     Follow-up: Return in about 6 months (around 11/01/2023).  Sandra Crouch, MD

## 2023-05-02 NOTE — Patient Instructions (Signed)
 Health Maintenance, Male  Adopting a healthy lifestyle and getting preventive care are important in promoting health and wellness. Ask your health care provider about:  The right schedule for you to have regular tests and exams.  Things you can do on your own to prevent diseases and keep yourself healthy.  What should I know about diet, weight, and exercise?  Eat a healthy diet    Eat a diet that includes plenty of vegetables, fruits, low-fat dairy products, and lean protein.  Do not eat a lot of foods that are high in solid fats, added sugars, or sodium.  Maintain a healthy weight  Body mass index (BMI) is a measurement that can be used to identify possible weight problems. It estimates body fat based on height and weight. Your health care provider can help determine your BMI and help you achieve or maintain a healthy weight.  Get regular exercise  Get regular exercise. This is one of the most important things you can do for your health. Most adults should:  Exercise for at least 150 minutes each week. The exercise should increase your heart rate and make you sweat (moderate-intensity exercise).  Do strengthening exercises at least twice a week. This is in addition to the moderate-intensity exercise.  Spend less time sitting. Even light physical activity can be beneficial.  Watch cholesterol and blood lipids  Have your blood tested for lipids and cholesterol at 37 years of age, then have this test every 5 years.  You may need to have your cholesterol levels checked more often if:  Your lipid or cholesterol levels are high.  You are older than 37 years of age.  You are at high risk for heart disease.  What should I know about cancer screening?  Many types of cancers can be detected early and may often be prevented. Depending on your health history and family history, you may need to have cancer screening at various ages. This may include screening for:  Colorectal cancer.  Prostate cancer.  Skin cancer.  Lung  cancer.  What should I know about heart disease, diabetes, and high blood pressure?  Blood pressure and heart disease  High blood pressure causes heart disease and increases the risk of stroke. This is more likely to develop in people who have high blood pressure readings or are overweight.  Talk with your health care provider about your target blood pressure readings.  Have your blood pressure checked:  Every 3-5 years if you are 9-95 years of age.  Every year if you are 85 years old or older.  If you are between the ages of 29 and 29 and are a current or former smoker, ask your health care provider if you should have a one-time screening for abdominal aortic aneurysm (AAA).  Diabetes  Have regular diabetes screenings. This checks your fasting blood sugar level. Have the screening done:  Once every three years after age 23 if you are at a normal weight and have a low risk for diabetes.  More often and at a younger age if you are overweight or have a high risk for diabetes.  What should I know about preventing infection?  Hepatitis B  If you have a higher risk for hepatitis B, you should be screened for this virus. Talk with your health care provider to find out if you are at risk for hepatitis B infection.  Hepatitis C  Blood testing is recommended for:  Everyone born from [birth year] through 1964.  Anyone  with known risk factors for hepatitis C.  Sexually transmitted infections (STIs)  You should be screened each year for STIs, including gonorrhea and chlamydia, if:  You are sexually active and are younger than 37 years of age.  You are older than 37 years of age and your health care provider tells you that you are at risk for this type of infection.  Your sexual activity has changed since you were last screened, and you are at increased risk for chlamydia or gonorrhea. Ask your health care provider if you are at risk.  Ask your health care provider about whether you are at high risk for HIV. Your health care provider  may recommend a prescription medicine to help prevent HIV infection. If you choose to take medicine to prevent HIV, you should first get tested for HIV. You should then be tested every 3 months for as long as you are taking the medicine.  Follow these instructions at home:  Alcohol use  Do not drink alcohol if your health care provider tells you not to drink.  If you drink alcohol:  Limit how much you have to 0-2 drinks a day.  Know how much alcohol is in your drink. In the [place] one drink equals one 12 oz bottle of beer (355 mL), one 5 oz glass of wine (148 mL), or one 1 oz glass of hard liquor (44 mL).  Lifestyle  Do not use any products that contain nicotine or tobacco. These products include cigarettes, chewing tobacco, and vaping devices, such as e-cigarettes. If you need help quitting, ask your health care provider.  Do not use street drugs.  Do not share needles.  Ask your health care provider for help if you need support or information about quitting drugs.  General instructions  Schedule regular health, dental, and eye exams.  Stay current with your vaccines.  Tell your health care provider if:  You often feel depressed.  You have ever been abused or do not feel safe at home.  Summary  Adopting a healthy lifestyle and getting preventive care are important in promoting health and wellness.  Follow your health care provider's instructions about healthy diet, exercising, and getting tested or screened for diseases.  Follow your health care provider's instructions on monitoring your cholesterol and blood pressure.  This information is not intended to replace advice given to you by your health care provider. Make sure you discuss any questions you have with your health care provider.  Document Revised: 05/12/2020 Document Reviewed: 05/12/2020  Elsevier Patient Education  2024 [employer].

## 2023-05-03 ENCOUNTER — Encounter: Payer: Self-pay | Admitting: Internal Medicine

## 2023-05-03 DIAGNOSIS — Z0001 Encounter for general adult medical examination with abnormal findings: Secondary | ICD-10-CM | POA: Insufficient documentation

## 2023-05-03 DIAGNOSIS — E7801 Familial hypercholesterolemia: Secondary | ICD-10-CM | POA: Insufficient documentation

## 2023-05-03 LAB — URINALYSIS, ROUTINE W REFLEX MICROSCOPIC
Bilirubin Urine: NEGATIVE
Hgb urine dipstick: NEGATIVE
Ketones, ur: NEGATIVE
Leukocytes,Ua: NEGATIVE
Nitrite: NEGATIVE
RBC / HPF: NONE SEEN (ref 0–?)
Specific Gravity, Urine: 1.025 (ref 1.000–1.030)
Total Protein, Urine: NEGATIVE
Urine Glucose: NEGATIVE
Urobilinogen, UA: 0.2 (ref 0.0–1.0)
WBC, UA: NONE SEEN (ref 0–?)
pH: 6 (ref 5.0–8.0)

## 2023-05-03 NOTE — Assessment & Plan Note (Signed)
 Exam completed, labs reviewed, vaccines reviewed, no cancer screenings indicated, pt ed material was given.

## 2023-07-05 ENCOUNTER — Other Ambulatory Visit: Payer: Self-pay | Admitting: Internal Medicine

## 2023-07-05 DIAGNOSIS — Z794 Long term (current) use of insulin: Secondary | ICD-10-CM

## 2023-07-25 ENCOUNTER — Other Ambulatory Visit: Payer: Self-pay | Admitting: Internal Medicine

## 2023-07-25 DIAGNOSIS — I10 Essential (primary) hypertension: Secondary | ICD-10-CM

## 2023-07-25 DIAGNOSIS — I5042 Chronic combined systolic (congestive) and diastolic (congestive) heart failure: Secondary | ICD-10-CM

## 2023-07-26 DIAGNOSIS — E669 Obesity, unspecified: Secondary | ICD-10-CM | POA: Diagnosis not present

## 2023-07-26 DIAGNOSIS — Z713 Dietary counseling and surveillance: Secondary | ICD-10-CM | POA: Diagnosis not present

## 2023-07-26 DIAGNOSIS — Z903 Acquired absence of stomach [part of]: Secondary | ICD-10-CM | POA: Diagnosis not present

## 2023-08-23 ENCOUNTER — Encounter: Payer: Self-pay | Admitting: Internal Medicine

## 2023-08-23 ENCOUNTER — Ambulatory Visit: Admitting: Internal Medicine

## 2023-08-23 ENCOUNTER — Ambulatory Visit: Payer: Self-pay | Admitting: Internal Medicine

## 2023-08-23 ENCOUNTER — Other Ambulatory Visit: Payer: Self-pay

## 2023-08-23 ENCOUNTER — Encounter (HOSPITAL_COMMUNITY): Payer: Self-pay

## 2023-08-23 ENCOUNTER — Emergency Department
Admission: EM | Admit: 2023-08-23 | Discharge: 2023-08-23 | Disposition: A | Discharge status: Home / Self Care | Payer: MEDICAID | Attending: NURSE PRACTITIONER | Admitting: NURSE PRACTITIONER

## 2023-08-23 ENCOUNTER — Emergency Department
Admission: EM | Admit: 2023-08-23 | Discharge: 2023-08-23 | Discharge status: Absent without leave | Payer: MEDICAID | Source: Home / Self Care

## 2023-08-23 VITALS — BP 152/102 | HR 67 | Temp 98.3°F | Resp 16 | Ht 73.0 in | Wt 247.6 lb

## 2023-08-23 DIAGNOSIS — L987 Excessive and redundant skin and subcutaneous tissue: Secondary | ICD-10-CM

## 2023-08-23 DIAGNOSIS — I5042 Chronic combined systolic (congestive) and diastolic (congestive) heart failure: Secondary | ICD-10-CM

## 2023-08-23 DIAGNOSIS — I10 Essential (primary) hypertension: Secondary | ICD-10-CM | POA: Diagnosis not present

## 2023-08-23 DIAGNOSIS — I480 Paroxysmal atrial fibrillation: Secondary | ICD-10-CM

## 2023-08-23 DIAGNOSIS — Z794 Long term (current) use of insulin: Secondary | ICD-10-CM

## 2023-08-23 DIAGNOSIS — E1165 Type 2 diabetes mellitus with hyperglycemia: Secondary | ICD-10-CM | POA: Diagnosis not present

## 2023-08-23 DIAGNOSIS — L02416 Cutaneous abscess of left lower limb: Secondary | ICD-10-CM

## 2023-08-23 DIAGNOSIS — Z5321 Procedure and treatment not carried out due to patient leaving prior to being seen by health care provider: Secondary | ICD-10-CM | POA: Insufficient documentation

## 2023-08-23 DIAGNOSIS — L0291 Cutaneous abscess, unspecified: Secondary | ICD-10-CM | POA: Insufficient documentation

## 2023-08-23 LAB — BASIC METABOLIC PANEL WITH GFR
BUN: 11 mg/dL (ref 6–23)
CO2: 29 meq/L (ref 19–32)
Calcium: 8.8 mg/dL (ref 8.4–10.5)
Chloride: 105 meq/L (ref 96–112)
Creatinine, Ser: 0.88 mg/dL (ref 0.40–1.50)
GFR: 110.07 mL/min (ref >60.00)
Glucose, Bld: 85 mg/dL (ref 70–99)
Potassium: 3.7 meq/L (ref 3.5–5.1)
Sodium: 140 meq/L (ref 135–145)

## 2023-08-23 LAB — CBC WITH DIFFERENTIAL/PLATELET
Basophils Absolute: 0 K/uL (ref 0.0–0.1)
Basophils Relative: 0.5 % (ref 0.0–3.0)
Eosinophils Absolute: 0.1 K/uL (ref 0.0–0.7)
Eosinophils Relative: 3 % (ref 0.0–5.0)
HCT: 38.2 % — ABNORMAL LOW (ref 39.0–52.0)
Hemoglobin: 12.4 g/dL — ABNORMAL LOW (ref 13.0–17.0)
Lymphocytes Relative: 57.9 % — ABNORMAL HIGH (ref 12.0–46.0)
Lymphs Abs: 2.6 K/uL (ref 0.7–4.0)
MCHC: 32.6 g/dL (ref 30.0–36.0)
MCV: 81.4 fl (ref 78.0–100.0)
Monocytes Absolute: 0.3 K/uL (ref 0.1–1.0)
Monocytes Relative: 7.1 % (ref 3.0–12.0)
Neutro Abs: 1.4 K/uL (ref 1.4–7.7)
Neutrophils Relative %: 31.5 % — ABNORMAL LOW (ref 43.0–77.0)
Platelets: 251 K/uL (ref 150.0–400.0)
RBC: 4.69 Mil/uL (ref 4.22–5.81)
RDW: 13 % (ref 11.5–15.5)
WBC: 4.5 K/uL (ref 4.0–10.5)

## 2023-08-23 MED ORDER — HYDRALAZINE HCL 25 MG PO TABS: 25.0000 mg | ORAL_TABLET | Freq: Three times a day (TID) | ORAL | 1 refills | Status: AC

## 2023-08-23 MED ORDER — TIRZEPATIDE 12.5 MG/0.5ML ~~LOC~~ SOAJ: 12.5000 mg | SUBCUTANEOUS | 0 refills | Status: DC

## 2023-08-23 MED ORDER — CARVEDILOL 12.5 MG PO TABS: 12.5000 mg | ORAL_TABLET | Freq: Two times a day (BID) | ORAL | 0 refills | Status: DC

## 2023-08-23 MED ORDER — SULFAMETHOXAZOLE 800 MG-TRIMETHOPRIM 160 MG TABLET
1.0000 | ORAL_TABLET | ORAL | Status: AC
Start: 2023-08-23 — End: 2023-08-23
  Administered 2023-08-23: 160 mg via ORAL
  Filled 2023-08-23: qty 1

## 2023-08-23 MED ORDER — SULFAMETHOXAZOLE 800 MG-TRIMETHOPRIM 160 MG TABLET
1.0000 | ORAL_TABLET | Freq: Two times a day (BID) | ORAL | 0 refills | Status: DC
Start: 2023-08-23 — End: 2023-08-27

## 2023-08-23 NOTE — Progress Notes (Signed)
 Subjective:  Patient ID: Jesse Duncan, male    DOB: Apr 23, 1985  Age: 38 y.o. MRN: [id number]  CC: Blood Pressure (Patient states that his blood pressure varies. ), Back Pain (Patient states that he's been losing weight but his back is hurting and he feels like that's because he's top heavy. He wants to know if he can get any surgery done for this for health reasoning's. ), and Anemia   HPI Jesse Duncan presents for f/up ---  Discussed the use of AI scribe software for clinical note transcription with the patient, who gave verbal consent to proceed.  History of Present Illness Jesse Duncan is a 38 year old male who presents with back discomfort due to the size and weight of his chest.  He experiences back discomfort attributed to the size and weight of his chest, describing it as 'starting to droop more and it's heavy.' The discomfort affects his neck and middle back. He wears a compression and sauna shirt to manage the movement and heaviness of his chest.  He has a history of hypertension and forgot to take his blood pressure medication today. At home, his blood pressure typically ranges between 130-150/80-90 mmHg. No high blood pressure symptoms such as headaches. His current medications include Tyzine, Bendeka at a dose of 10 mg, carvedilol , and hydralazine .  He is currently taking Mounjaro , which he tolerates well without any gastrointestinal side effects such as constipation, diarrhea, abdominal pain, or trouble swallowing. He recently switched to a 30-day supply due to cost and requires a refill.  He remains physically active and reports feeling good during activities. He denies swelling in his legs and feet.    Outpatient Medications Prior to Visit  Medication Sig Dispense Refill   ASPIRIN  PO Take 81 mg by mouth daily.     Continuous Glucose Receiver (DEXCOM G7 RECEIVER) DEVI 1 Act by Does not apply route daily. 9 each 1   Continuous Glucose Sensor (DEXCOM G7  SENSOR) MISC 1 Act by Does not apply route daily. 9 each 1   ezetimibe  (ZETIA ) 10 MG tablet Take 1 tablet (10 mg total) by mouth daily. 90 tablet 2   omeprazole (PRILOSEC) 20 MG capsule Take 20 mg by mouth every morning.     rosuvastatin  (CRESTOR ) 40 MG tablet Take 1 tablet (40 mg total) by mouth daily. 90 tablet 1   URSODIOL PO Take 250 mg by mouth daily in the afternoon.     carvedilol  (COREG ) 12.5 MG tablet TAKE 1 TABLET (12.5MG  TOTAL) BY MOUTH TWICE A DAY WITH MEALS 180 tablet 0   hydrALAZINE  (APRESOLINE ) 25 MG tablet Take 1 tablet (25 mg total) by mouth 3 (three) times daily. 270 tablet 1   tirzepatide  (MOUNJARO ) 10 MG/0.5ML Pen INJECT 10 MG INTO THE SKIN ONE TIME PER WEEK 6 mL 0   triamterene -hydrochlorothiazide  (DYAZIDE) 37.5-25 MG capsule TAKE 1 EACH (1 CAPSULE TOTAL) BY MOUTH DAILY. 90 capsule 0   No facility-administered medications prior to visit.    ROS Review of Systems  Constitutional:  Negative for appetite change, chills, diaphoresis, fatigue and fever.  HENT: Negative.    Eyes: Negative.   Respiratory: Negative.  Negative for cough, chest tightness, shortness of breath and wheezing.   Cardiovascular:  Negative for chest pain, palpitations and leg swelling.  Gastrointestinal:  Negative for abdominal pain, constipation, diarrhea, nausea and vomiting.  Endocrine: Negative.   Genitourinary: Negative.  Negative for difficulty urinating.  Musculoskeletal:  Positive for back pain. Negative for joint swelling and  myalgias.  Skin: Negative.   Neurological:  Negative for dizziness, weakness, light-headedness and headaches.  Hematological:  Negative for adenopathy. Does not bruise/bleed easily.  Psychiatric/Behavioral: Negative.      Objective:  BP (!) 152/102 (BP Location: Left Arm, Patient Position: Sitting, Cuff Size: Normal)   Pulse 67   Temp 98.3 F (36.8 C) (Oral)   Resp 16   Ht 6' 1 (1.854 m)   Wt 247 lb 9.6 oz (112.3 kg)   SpO2 96%   BMI 32.67 kg/m   BP  Readings from Last 3 Encounters:  08/23/23 (!) 152/102  05/02/23 (!) 136/94  01/07/23 134/86    Wt Readings from Last 3 Encounters:  08/23/23 247 lb 9.6 oz (112.3 kg)  05/02/23 238 lb 9.6 oz (108.2 kg)  12/16/22 266 lb (120.7 kg)    Physical Exam Vitals reviewed.  Constitutional:      Appearance: Normal appearance.  HENT:     Nose: Nose normal.     Mouth/Throat:     Mouth: Mucous membranes are moist.  Eyes:     General: No scleral icterus.    Conjunctiva/sclera: Conjunctivae normal.  Cardiovascular:     Rate and Rhythm: Normal rate and regular rhythm.     Heart sounds: No murmur heard.    No friction rub. No gallop.  Pulmonary:     Effort: Pulmonary effort is normal.     Breath sounds: No stridor. No wheezing, rhonchi or rales.  Abdominal:     General: Abdomen is flat.     Palpations: There is no mass.     Tenderness: There is no abdominal tenderness. There is no guarding.     Hernia: No hernia is present.  Musculoskeletal:        General: Normal range of motion.     Cervical back: Neck supple.     Right lower leg: No edema.     Left lower leg: No edema.  Lymphadenopathy:     Cervical: No cervical adenopathy.  Skin:    General: Skin is warm and dry.  Neurological:     General: No focal deficit present.     Mental Status: He is alert.  Psychiatric:        Mood and Affect: Mood normal.        Behavior: Behavior normal.     Lab Results  Component Value Date   WBC 4.5 08/23/2023   HGB 12.4 (L) 08/23/2023   HCT 38.2 (L) 08/23/2023   PLT 251.0 08/23/2023   GLUCOSE 85 08/23/2023   CHOL 163 09/22/2022   TRIG 217.0 (H) 09/22/2022   HDL 40.20 09/22/2022   LDLDIRECT 223.0 04/08/2022   LDLCALC 79 09/22/2022   ALT 17 05/02/2023   AST 18 05/02/2023   NA 140 08/23/2023   K 3.7 08/23/2023   CL 105 08/23/2023   CREATININE 0.88 08/23/2023   BUN 11 08/23/2023   CO2 29 08/23/2023   TSH 1.74 09/22/2022   PSA 1.57 10/18/2012   INR 1.0 05/20/2022   HGBA1C 4.7  05/02/2023   MICROALBUR 4.5 (H) 05/02/2023    MR BRAIN WO CONTRAST Result Date: 05/20/2022 CLINICAL DATA:  Neuro deficit, stroke suspected EXAM: MRI HEAD WITHOUT CONTRAST TECHNIQUE: Multiplanar, multiecho pulse sequences of the brain and surrounding structures were obtained without intravenous contrast. COMPARISON:  04/22/2022 FINDINGS: Brain: No evidence of acute or subacute infarct.Increased signal on diffusion-weighted imaging in the left parietooccipital region and left splenium of the corpus callosum, which are without definite ADC correlates  and likely reflect T2 shine through. No acute hemorrhage, mass, mass effect, or midline shift. No hydrocephalus or extra-axial collection. Normal pituitary and craniocervical junction. Curvilinear susceptibility is associated with the left parietooccipital infarct, likely petechial hemorrhage. Additional focus of hemosiderin deposition left lentiform nucleus (series 4, image 57), which correlates with a remote lacunar infarct. Additional cortical encephalomalacia and lacunar infarcts in the posterior left frontal lobe (series 3, image 35. Vascular: Normal arterial flow voids. Skull and upper cervical spine: Normal marrow signal. Sinuses/Orbits: Mucous retention cyst in the left maxillary sinus. Mild mucosal thickening in the ethmoid air cells. No acute finding in the orbits. Other: The mastoid air cells are well aerated. IMPRESSION: No acute intracranial process. No evidence of acute or subacute infarct. Electronically Signed   By: Donald Campion M.D.   On: 05/20/2022 17:13   CT HEAD WO CONTRAST Result Date: 05/20/2022 CLINICAL DATA:  Stroke suspected, numbness/tingling of right face and right EXAM: CT HEAD WITHOUT CONTRAST TECHNIQUE: Contiguous axial images were obtained from the base of the skull through the vertex without intravenous contrast. RADIATION DOSE REDUCTION: This exam was performed according to the departmental dose-optimization program which includes  automated exposure control, adjustment of the mA and/or kV according to patient size and/or use of iterative reconstruction technique. COMPARISON:  04/22/2022 FINDINGS: Brain: Possible hypodensity in the right anterior temporal lobe (series 3, image 11), although this area is prone to artifact. Hypodensity in the left PCA territory, consistent with expected evolution of the infarcts noted on the 04/22/2022 exam. The previously noted left MCA territory infarcts are less conspicuous and only caused minimal encephalomalacia (series 3, images 21 and 22). No evidence of acute hemorrhage, mass, mass effect, or midline shift. No hydrocephalus or extra-axial fluid collection. Vascular: No hyperdense vessel. Atherosclerotic calcifications in the intracranial carotid and vertebral arteries. Skull: Negative for fracture or focal lesion. Sinuses/Orbits: Small mucous retention cyst in the left maxillary sinus. No acute finding in the orbits. Other: The mastoid air cells are well aerated. IMPRESSION: 1. Possible hypodensity in the right anterior temporal lobe. Although this area is prone to artifact, this could an acute infarct. MRI is recommended for further evaluation. 2. Evolution of previously noted left MCA and PCA territory infarcts. 3. No acute intracranial hemorrhage. These results were called by telephone at the time of interpretation on 05/20/2022 at 1:52 pm to provider COOPER ROBBINS , who verbally acknowledged these results. Electronically Signed   By: Donald Campion M.D.   On: 05/20/2022 13:53    Assessment & Plan:   Primary hypertension- BP is not at goal. He will improve compliance with BP medications. -     Basic metabolic panel with GFR; Future -     CBC with Differential/Platelet; Future -     hydrALAZINE  HCl; Take 1 tablet (25 mg total) by mouth 3 (three) times daily.  Dispense: 270 tablet; Refill: 1 -     Carvedilol ; Take 1 tablet (12.5 mg total) by mouth 2 (two) times daily with a meal.  Dispense: 180  tablet; Refill: 0  Type 2 diabetes mellitus with hyperglycemia, with long-term current use of insulin  (HCC) -     Basic metabolic panel with GFR; Future -     Tirzepatide ; Inject 12.5 mg into the skin once a week.  Dispense: 6 mL; Refill: 0  Chronic combined systolic and diastolic heart failure (HCC) -     hydrALAZINE  HCl; Take 1 tablet (25 mg total) by mouth 3 (three) times daily.  Dispense: 270  tablet; Refill: 1 -     Carvedilol ; Take 1 tablet (12.5 mg total) by mouth 2 (two) times daily with a meal.  Dispense: 180 tablet; Refill: 0  Paroxysmal atrial fibrillation (HCC)- He has good R/R control. -     Carvedilol ; Take 1 tablet (12.5 mg total) by mouth 2 (two) times daily with a meal.  Dispense: 180 tablet; Refill: 0  Localized redundant skin -     Ambulatory referral to Plastic Surgery     Follow-up: Return in about 3 months (around 11/23/2023).  Debby Molt, MD

## 2023-08-23 NOTE — Patient Instructions (Signed)
 Hypertension, Adult High blood pressure (hypertension) is when the force of blood pumping through the arteries is too strong. The arteries are the blood vessels that carry blood from the heart throughout the body. Hypertension forces the heart to work harder to pump blood and may cause arteries to become narrow or stiff. Untreated or uncontrolled hypertension can lead to a heart attack, heart failure, a stroke, kidney disease, and other problems. A blood pressure reading consists of a higher number over a lower number. Ideally, your blood pressure should be below 120/80. The first ("top") number is called the systolic pressure. It is a measure of the pressure in your arteries as your heart beats. The second ("bottom") number is called the diastolic pressure. It is a measure of the pressure in your arteries as the heart relaxes. What are the causes? The exact cause of this condition is not known. There are some conditions that result in high blood pressure. What increases the risk? Certain factors may make you more likely to develop high blood pressure. Some of these risk factors are under your control, including: Smoking. Not getting enough exercise or physical activity. Being overweight. Having too much fat, sugar, calories, or salt (sodium) in your diet. Drinking too much alcohol. Other risk factors include: Having a personal history of heart disease, diabetes, high cholesterol, or kidney disease. Stress. Having a family history of high blood pressure and high cholesterol. Having obstructive sleep apnea. Age. The risk increases with age. What are the signs or symptoms? High blood pressure may not cause symptoms. Very high blood pressure (hypertensive crisis) may cause: Headache. Fast or irregular heartbeats (palpitations). Shortness of breath. Nosebleed. Nausea and vomiting. Vision changes. Severe chest pain, dizziness, and seizures. How is this diagnosed? This condition is diagnosed by  measuring your blood pressure while you are seated, with your arm resting on a flat surface, your legs uncrossed, and your feet flat on the floor. The cuff of the blood pressure monitor will be placed directly against the skin of your upper arm at the level of your heart. Blood pressure should be measured at least twice using the same arm. Certain conditions can cause a difference in blood pressure between your right and left arms. If you have a high blood pressure reading during one visit or you have normal blood pressure with other risk factors, you may be asked to: Return on a different day to have your blood pressure checked again. Monitor your blood pressure at home for 1 week or longer. If you are diagnosed with hypertension, you may have other blood or imaging tests to help your health care provider understand your overall risk for other conditions. How is this treated? This condition is treated by making healthy lifestyle changes, such as eating healthy foods, exercising more, and reducing your alcohol intake. You may be referred for counseling on a healthy diet and physical activity. Your health care provider may prescribe medicine if lifestyle changes are not enough to get your blood pressure under control and if: Your systolic blood pressure is above 130. Your diastolic blood pressure is above 80. Your personal target blood pressure may vary depending on your medical conditions, your age, and other factors. Follow these instructions at home: Eating and drinking  Eat a diet that is high in fiber and potassium, and low in sodium, added sugar, and fat. An example of this eating plan is called the DASH diet. DASH stands for Dietary Approaches to Stop Hypertension. To eat this way: Eat  plenty of fresh fruits and vegetables. Try to fill one half of your plate at each meal with fruits and vegetables. Eat whole grains, such as whole-wheat pasta, brown rice, or whole-grain bread. Fill about one  fourth of your plate with whole grains. Eat or drink low-fat dairy products, such as skim milk or low-fat yogurt. Avoid fatty cuts of meat, processed or cured meats, and poultry with skin. Fill about one fourth of your plate with lean proteins, such as fish, chicken without skin, beans, eggs, or tofu. Avoid pre-made and processed foods. These tend to be higher in sodium, added sugar, and fat. Reduce your daily sodium intake. Many people with hypertension should eat less than 1,500 mg of sodium a day. Do not drink alcohol if: Your health care provider tells you not to drink. You are pregnant, may be pregnant, or are planning to become pregnant. If you drink alcohol: Limit how much you have to: 0-1 drink a day for women. 0-2 drinks a day for men. Know how much alcohol is in your drink. In the [place] one drink equals one 12 oz bottle of beer (355 mL), one 5 oz glass of wine (148 mL), or one 1 oz glass of hard liquor (44 mL). Lifestyle  Work with your health care provider to maintain a healthy body weight or to lose weight. Ask what an ideal weight is for you. Get at least 30 minutes of exercise that causes your heart to beat faster (aerobic exercise) most days of the week. Activities may include walking, swimming, or biking. Include exercise to strengthen your muscles (resistance exercise), such as Pilates or lifting weights, as part of your weekly exercise routine. Try to do these types of exercises for 30 minutes at least 3 days a week. Do not use any products that contain nicotine or tobacco. These products include cigarettes, chewing tobacco, and vaping devices, such as e-cigarettes. If you need help quitting, ask your health care provider. Monitor your blood pressure at home as told by your health care provider. Keep all follow-up visits. This is important. Medicines Take over-the-counter and prescription medicines only as told by your health care provider. Follow directions carefully. Blood  pressure medicines must be taken as prescribed. Do not skip doses of blood pressure medicine. Doing this puts you at risk for problems and can make the medicine less effective. Ask your health care provider about side effects or reactions to medicines that you should watch for. Contact a health care provider if you: Think you are having a reaction to a medicine you are taking. Have headaches that keep coming back (recurring). Feel dizzy. Have swelling in your ankles. Have trouble with your vision. Get help right away if you: Develop a severe headache or confusion. Have unusual weakness or numbness. Feel faint. Have severe pain in your chest or abdomen. Vomit repeatedly. Have trouble breathing. These symptoms may be an emergency. Get help right away. Call 911. Do not wait to see if the symptoms will go away. Do not drive yourself to the hospital. Summary Hypertension is when the force of blood pumping through your arteries is too strong. If this condition is not controlled, it may put you at risk for serious complications. Your personal target blood pressure may vary depending on your medical conditions, your age, and other factors. For most people, a normal blood pressure is less than 120/80. Hypertension is treated with lifestyle changes, medicines, or a combination of both. Lifestyle changes include losing weight, eating a healthy,  low-sodium diet, exercising more, and limiting alcohol. This information is not intended to replace advice given to you by your health care provider. Make sure you discuss any questions you have with your health care provider. Document Revised: 10/28/2020 Document Reviewed: 10/28/2020 Elsevier Patient Education  2024 [employer].

## 2023-08-23 NOTE — ED Provider Notes (Signed)
 Department of Emergency Medicine  [facility] - Emergency Department  08/23/2023        Chief Complaint   Patient presents with    Wound Check     For few days, has a wound on left upper leg, pt thinks is infected, area is draining, is red/painful       Patient is a 38 y.o.  male presenting to the ED with chief complaint of wound check.  Friend who had staph infection thinks he contracted it.  Patient reports wound to left upper leg.  He denies any fever or chills.  Denies any known history of MRSA. Hx iv cocaine use     PMH: Depression, hep c, HTN  PSH: Hand, orbits    Review of Systems:    Constitutional: No fever, chills  Skin: rash left leg   Cardio: No chest pain, palpitations   Respiratory: No cough, wheezing or SOB  GU:  No dysuria, hematuria, polyuria  MSK: No joint pain.  No neck or back pain  Psych: No SI or HI. Normal mood    All other symptoms reviewed and are negative, unless commented on in the HPI.     Past Medical History:  Past Medical History:   Diagnosis Date    Allergic rhinitis     Depression     Essential hypertension     Fracture of nasal bones     Headache     Nosebleed     Sinusitis     Viral hepatitis C      Past Surgical History:   Procedure Laterality Date    Hx hand surgery      Hx orbital surgery Left 03/31/2020     Above history reviewed with patient.  Allergies, medication list, and old records also reviewed.     Filed Vitals:    08/23/23 0737   BP: (!) 151/90   Pulse: 100   Resp: 18   Temp: 36.7 C (98.1 F)   SpO2: 100%       Physical Exam:     ED Triage Vitals [08/23/23 0737]   BP (Non-Invasive) (!) 151/90   Heart Rate 100   Respiratory Rate 18   Temperature 36.7 C (98.1 F)   SpO2 100 %   Weight 85.3 kg (188 lb)   Height 1.753 m (5' 9)     Physical Exam    Nursing note and vitals reviewed.  Vital signs reviewed as above. No acute distress.   Constitutional: Pt is well-developed and well-nourished.   Head: Normocephalic and atraumatic.   Eyes: Conjunctivae are normal.  Pupils are equal, round, and reactive to light. EOM are intact  Neck: Soft, supple, full range of motion.  Cardiovascular: RRR.  No Murmurs/rubs/gallops. Distal pulses present and equal bilaterally.  Pulmonary/Chest: Normal BS BL with no distress. No audible wheezes or crackles are noted.  Abdominal: Soft, nontender, nondistended.  No rebound, guarding, or masses.  Musculoskeletal: Normal range of motion. No deformities.  Exhibits no edema and no tenderness.   Neurological: CNs 2-12 grossly intact.  No focal deficits noted. Patient is alert and responsive moving all of her extremities equally and fully.  Skin: Warm and dry. No rash or lesions, notable fluctuance abscess to the left medial upper thigh approximately 5 x 5 cm.  Psychiatric: Patient has a normal mood and affect.     Workup:     Labs:  No results found for this or any previous visit (from the past 24  hours).    Imaging:         Orders Placed This Encounter    trimethoprim -sulfamethoxazole  (BACTRIM  DS) 160-800mg  per tablet    trimethoprim -sulfamethoxazole  (BACTRIM  DS) 160-800mg  per tablet       Abnormal Lab results:  Labs Ordered/Reviewed - No data to display      Plan: Appropriate labs and imaging ordered. Medical Records reviewed.    MDM:   During the patient's stay in the emergency department, the above listed imaging and/or labs were performed to assist with medical decision making and were reviewed by myself when available for review.     Patient presents to ER for wound check.  Discussed with pt that he has an abscess and standard treatment is I&D. Patient refusing I&D at this time.  Discussed with patient best relief of symptoms and wound management would be I&D to remove all the drainage and pus.  Patient refusing.  Risk of refusing discussed.  Patient aware to use warm compresses and will be covered with antibiotics of Bactrim .  Extensive return ER precautions were discussed and all questions were answered    Pt remained stable throughout the  emergency department course.      Impression:   Clinical Impression   Abscess (Primary)         Disposition: Discharged       Kerman Opal CRNP  08/23/2023 7:46 AM   [facility] Emergency Department

## 2023-08-23 NOTE — ED Nurses Note (Signed)

## 2023-08-26 MED ORDER — SPIRONOLACTONE 25 MG PO TABS: 25.0000 mg | ORAL_TABLET | Freq: Every day | ORAL | 0 refills | Status: DC

## 2023-08-27 ENCOUNTER — Other Ambulatory Visit: Payer: Self-pay

## 2023-08-27 ENCOUNTER — Emergency Department (HOSPITAL_COMMUNITY): Payer: MEDICAID

## 2023-08-27 ENCOUNTER — Emergency Department (HOSPITAL_COMMUNITY): Payer: MEDICAID | Admitting: Ultrasound

## 2023-08-27 ENCOUNTER — Encounter (HOSPITAL_COMMUNITY): Payer: Self-pay

## 2023-08-27 ENCOUNTER — Emergency Department
Admission: EM | Admit: 2023-08-27 | Discharge: 2023-08-27 | Disposition: A | Discharge status: Home / Self Care | Payer: MEDICAID | Attending: NURSE PRACTITIONER | Admitting: NURSE PRACTITIONER

## 2023-08-27 DIAGNOSIS — N5089 Other specified disorders of the male genital organs: Secondary | ICD-10-CM

## 2023-08-27 DIAGNOSIS — L0291 Cutaneous abscess, unspecified: Secondary | ICD-10-CM

## 2023-08-27 DIAGNOSIS — L02416 Cutaneous abscess of left lower limb: Secondary | ICD-10-CM | POA: Insufficient documentation

## 2023-08-27 DIAGNOSIS — R059 Cough, unspecified: Secondary | ICD-10-CM

## 2023-08-27 LAB — CBC WITH DIFF
BASOPHIL #: <0.1 x10ˆ3/uL (ref <=0.20)
BASOPHIL %: 0.5 %
EOSINOPHIL #: 0.14 x10ˆ3/uL (ref <=0.50)
EOSINOPHIL %: 2.2 %
HCT: 41.7 % (ref 38.9–52.0)
HGB: 14.4 g/dL (ref 13.4–17.5)
IMMATURE GRANULOCYTE #: <0.1 x10ˆ3/uL (ref <0.10)
IMMATURE GRANULOCYTE %: 0.5 % (ref 0.0–1.0)
LYMPHOCYTE #: 2.75 x10ˆ3/uL (ref 1.00–4.80)
LYMPHOCYTE %: 43.2 %
MCH: 28.7 pg (ref 26.0–32.0)
MCHC: 34.5 g/dL (ref 31.0–35.5)
MCV: 83.1 fL (ref 78.0–100.0)
MONOCYTE #: 0.7 x10ˆ3/uL (ref 0.20–1.10)
MONOCYTE %: 11 %
MPV: 9.5 fL (ref 8.7–12.5)
NEUTROPHIL #: 2.72 x10ˆ3/uL (ref 1.50–7.70)
NEUTROPHIL %: 42.6 %
PLATELETS: 283 x10ˆ3/uL (ref 150–400)
RBC: 5.02 x10ˆ6/uL (ref 4.50–6.10)
RDW-CV: 11.9 % (ref 11.5–15.5)
WBC: 6.4 x10ˆ3/uL (ref 3.7–11.0)

## 2023-08-27 LAB — COMPREHENSIVE METABOLIC PANEL, NON-FASTING
ALBUMIN: 3.9 g/dL (ref 3.5–5.0)
ALKALINE PHOSPHATASE: 119 U/L — ABNORMAL HIGH (ref 45–115)
ALT (SGPT): 32 U/L (ref <43)
ANION GAP: 8 mmol/L (ref 4–13)
AST (SGOT): 38 U/L — ABNORMAL HIGH (ref 11–34)
BILIRUBIN TOTAL: 0.5 mg/dL (ref 0.3–1.3)
BUN/CREA RATIO: 20 (ref 6–22)
BUN: 20 mg/dL (ref 8–25)
CALCIUM: 9 mg/dL (ref 8.6–10.2)
CHLORIDE: 108 mmol/L (ref 96–111)
CO2 TOTAL: 24 mmol/L (ref 22–30)
CREATININE: 0.99 mg/dL (ref 0.75–1.35)
ESTIMATED GFR - MALE: >90 mL/min/BSA (ref >=60)
GLUCOSE: 80 mg/dL (ref 65–125)
POTASSIUM: 3.9 mmol/L (ref 3.5–5.1)
PROTEIN TOTAL: 7 g/dL (ref 6.4–8.3)
SODIUM: 140 mmol/L (ref 136–145)

## 2023-08-27 MED ORDER — CLINDAMYCIN HCL 150 MG CAPSULE
450.0000 mg | ORAL_CAPSULE | Freq: Three times a day (TID) | ORAL | 0 refills | Status: AC
Start: 2023-08-27 — End: 2023-09-03

## 2023-08-27 NOTE — ED Provider Notes (Signed)
 Department of Emergency Medicine  [facility] - Emergency Department  08/27/2023        Chief Complaint   Patient presents with    Wound Check     Abscess on left thigh.  Has been seen recently but states it is getting worse       Patient is a 38 y.o.  male presenting to the ED with chief complaint of wound check.  Patient presents to the ER with complaint of abscess on left inner thigh.  He has been taking antibiotics as prescribed.  Patient was recently seen refused I&D.  Denies any fever or chills.  He reports concern for parasitic infection after his dog was recently diagnosed with parasites.  Patient also incidentally reports he noted a lump to his groin area.  SMOKER    PMH:  Depression, IV drug use, viral hepatitis, headache, allergies, hypertension  PSH:  HAND, ORBITS    Review of Systems:    Constitutional: No fever, chills  Skin: No rashes or lesions, ABSCESS TO IN HER LEFT THIGH  HENT: No sore throat, ear pain, or difficulty swallowing  Eyes: No vision changes, redness, discharge  Cardio: No chest pain, palpitations   Respiratory: No cough, wheezing or SOB  GI:  No nausea or vomiting. No diarrhea or constipation. No abdominal pain  GU:  No dysuria, hematuria, polyuria  MSK: No joint pain.  No neck or back pain  Neuro: No numbness, tingling, or weakness.  No headache  Psych: No SI or HI. Normal mood    All other symptoms reviewed and are negative, unless commented on in the HPI.     Past Medical History:  Past Medical History:   Diagnosis Date    Allergic rhinitis     Depression     Essential hypertension     Fracture of nasal bones     Headache     Nosebleed     Sinusitis     Viral hepatitis C      Past Surgical History:   Procedure Laterality Date    Hx hand surgery      Hx orbital surgery Left 03/31/2020     Above history reviewed with patient.  Allergies, medication list, and old records also reviewed.     Filed Vitals:    08/27/23 1243 08/27/23 1300 08/27/23 1315 08/27/23 1326   BP: (!) 168/102       Pulse: (!) 102      Resp: 18   16   Temp: 36.7 C (98.1 F)      SpO2: 99% 98% 96%        Physical Exam:     ED Triage Vitals [08/27/23 1243]   BP (Non-Invasive) (!) 168/102   Heart Rate (!) 102   Respiratory Rate 18   Temperature 36.7 C (98.1 F)   SpO2 99 %   Weight 82.7 kg (182 lb 5.1 oz)   Height 1.753 m (5' 9)     Physical Exam    Nursing note and vitals reviewed.  Vital signs reviewed as above. No acute distress.   Constitutional: Pt is well-developed and well-nourished.   Head: Normocephalic and atraumatic.   Eyes: Conjunctivae are normal. Pupils are equal, round, and reactive to light. EOM are intact  Neck: Soft, supple, full range of motion.  Cardiovascular: RRR.  No Murmurs/rubs/gallops. Distal pulses present and equal bilaterally.  Pulmonary/Chest: Normal BS BL with no distress. No audible wheezes or crackles are noted.  Abdominal: Soft, nontender,  nondistended.  No rebound, guarding, or masses.  Musculoskeletal: Normal range of motion. No deformities.  Exhibits no edema and no tenderness.   Neurological: CNs 2-12 grossly intact.  No focal deficits noted. Patient is alert and responsive moving all of her extremities equally and fully.  Skin: Warm and dry. No rash or lesions, 2x2 cm abscess noted to inner thigh with a surrounding erythema approximately 4 x 4 cm in total.  Scant fluctuance noted  Psychiatric: Patient has a normal mood and affect.     Workup:     Labs:  Results for orders placed or performed during the hospital encounter of 08/27/23 (from the past 24 hours)   CBC/DIFF    Collection Time: 08/27/23  1:23 PM    Narrative    The following orders were created for panel order CBC/DIFF.  Procedure                               Abnormality         Status                     ---------                               -----------         ------                     CBC WITH IPQQ[[id number]]                                    Final result                 Please view results for these tests on the  individual orders.   COMPREHENSIVE METABOLIC PANEL, NON-FASTING    Collection Time: 08/27/23  1:23 PM   Result Value Ref Range    SODIUM 140 136 - 145 mmol/L    POTASSIUM 3.9 3.5 - 5.1 mmol/L    CHLORIDE 108 96 - 111 mmol/L    CO2 TOTAL 24 22 - 30 mmol/L    ANION GAP 8 4 - 13 mmol/L    BUN 20 8 - 25 mg/dL    CREATININE 9.00 9.24 - 1.35 mg/dL    BUN/CREA RATIO 20 6 - 22    ESTIMATED GFR - MALE >90 >=60 mL/min/BSA    ALBUMIN 3.9 3.5 - 5.0 g/dL    CALCIUM 9.0 8.6 - 89.7 mg/dL    GLUCOSE 80 65 - 874 mg/dL    ALKALINE PHOSPHATASE 119 (H) 45 - 115 U/L    ALT (SGPT) 32 <43 U/L    AST (SGOT)  38 (H) 11 - 34 U/L    BILIRUBIN TOTAL 0.5 0.3 - 1.3 mg/dL    PROTEIN TOTAL 7.0 6.4 - 8.3 g/dL   CBC WITH DIFF    Collection Time: 08/27/23  1:23 PM   Result Value Ref Range    WBC 6.4 3.7 - 11.0 x10^3/uL    RBC 5.02 4.50 - 6.10 x10^6/uL    HGB 14.4 13.4 - 17.5 g/dL    HCT 58.2 61.0 - 47.9 %    MCV 83.1 78.0 - 100.0 fL    MCH 28.7 26.0 - 32.0 pg    MCHC 34.5 31.0 - 35.5 g/dL  RDW-CV 11.9 11.5 - 15.5 %    PLATELETS 283 150 - 400 x10^3/uL    MPV 9.5 8.7 - 12.5 fL    NEUTROPHIL % 42.6 %    LYMPHOCYTE % 43.2 %    MONOCYTE % 11.0 %    EOSINOPHIL % 2.2 %    BASOPHIL % 0.5 %    NEUTROPHIL # 2.72 1.50 - 7.70 x10^3/uL    LYMPHOCYTE # 2.75 1.00 - 4.80 x10^3/uL    MONOCYTE # 0.70 0.20 - 1.10 x10^3/uL    EOSINOPHIL # 0.14 <=0.50 x10^3/uL    BASOPHIL # <0.10 <=0.20 x10^3/uL    IMMATURE GRANULOCYTE % 0.5 0.0 - 1.0 %    IMMATURE GRANULOCYTE # <0.10 <0.10 x10^3/uL   EXTRA TUBES    Collection Time: 08/27/23  1:30 PM    Narrative    The following orders were created for panel order EXTRA TUBES.  Procedure                               Abnormality         Status                     ---------                               -----------         ------                     BLUE TOP ULAZ[[id number]]                                    In process                 RED TOP ULAZ[[id number]]                                     In process                   Please view  results for these tests on the individual orders.       Imaging:    Results for orders placed or performed during the hospital encounter of 08/27/23 (from the past 72 hours)   XR AP MOBILE CHEST     Status: None    Narrative    Markie N Ressel  Male, 38 years old.    XR AP MOBILE CHEST performed on 08/27/2023 2:00 PM.    REASON FOR EXAM:  COUGH    TECHNIQUE: 1 view/1 image(s) submitted for interpretation.    COMPARISON: 02/16/2022    FINDINGS: Heart size and pulmonary vasculature within normal limits. No focal consolidation, pleural effusions or pneumothoraces. No acute bony abnormality.      Impression    No acute cardiopulmonary abnormality.          Radiologist location ID: [id number]     US  SCROTUM     Status: None    Narrative    US  SCROTUM W COMP DOPPLER performed on 08/27/2023 2:38 PM    INDICATION: 38 years old Male; PALPABLE LUMP per history palpable area has been present for multiple years    TECHNIQUE: Ultrasound examination of the scrotum  COMPARISON: None    FINDINGS: The right testis measures 4.3 x 2.1 x 3.1 cm and the left testis measures 4.6 x 2.1 x 3 cm, in the long, transverse, and AP dimensions.  Both testes are uniform in echogenicity and unremarkable in appearance, with no lesions.  Doppler imaging symmetric intratesticular blood flow.      The left and right epididymides are unremarkable. No appreciable hydroceles are seen. There is an hypoechoic nodule within the superficial soft tissues in the area of concern measuring 1.3 x 0.5 x 1.7 cm without internal vascular flow seen within this area. This does not appear to have the typical appearance of a lipoma        Impression    1. No intratesticular mass or evidence of testicular torsion.  2. Hypoechoic nodule in the superficial soft tissues in the area of concern without internal vascularity. This does not appear to have the typical appearance of a lipoma however as the area of concern has been present for multiple years is favored to be  benign. Interval follow-up in 3-6 months may be warranted to confirm stability.        Radiologist location ID: [id number]         Orders Placed This Encounter    WOUND, SUPERFICIAL/NON-STERILE SITE, AEROBIC CULTURE AND GRAM STAIN    XR AP MOBILE CHEST    US  SCROTUM    CBC/DIFF    COMPREHENSIVE METABOLIC PANEL, NON-FASTING    CBC WITH DIFF    EXTRA TUBES    BLUE TOP TUBE    RED TOP TUBE    FOLLOW-UP: UROLOGY - [place] [facility] [place]    clindamycin  (CLEOCIN ) 150 mg Oral Capsule       Abnormal Lab results:  Labs Ordered/Reviewed   COMPREHENSIVE METABOLIC PANEL, NON-FASTING - Abnormal; Notable for the following components:       Result Value    ALKALINE PHOSPHATASE 119 (*)     AST (SGOT)  38 (*)     All other components within normal limits   WOUND, SUPERFICIAL/NON-STERILE SITE, AEROBIC CULTURE AND GRAM STAIN   CBC/DIFF    Narrative:     The following orders were created for panel order CBC/DIFF.  Procedure                               Abnormality         Status                     ---------                               -----------         ------                     CBC WITH IPQQ[[id number]]                                    Final result                 Please view results for these tests on the individual orders.   CBC WITH DIFF   EXTRA TUBES    Narrative:     The following orders were created for panel order EXTRA TUBES.  Procedure                               Abnormality         Status                     ---------                               -----------         ------                     BLUE TOP ULAZ[[id number]]                                    In process                 RED TOP ULAZ[[id number]]                                     In process                   Please view results for these tests on the individual orders.   BLUE TOP TUBE   RED TOP TUBE       .     Plan: Appropriate labs and imaging ordered. Medical Records reviewed.    MDM:   During the patient's stay in the emergency  department, the above listed imaging and/or labs were performed to assist with medical decision making and were reviewed by myself when available for review.     ED Course as of 08/27/23 1544   Sat Aug 27, 2023   1334 WBC: 6.4   1334 HGB: 14.4   1334 HCT: 41.7   1334 PLATELET COUNT: 283   1404 WBC: 6.4   1404 HGB: 14.4   1404 HCT: 41.7   1404 PLATELET COUNT: 283   1430 XR AP MOBILE CHEST  IMPRESSION:  No acute cardiopulmonary abnormality.     1452 US  SCROTUM  IMPRESSION:  1.No intratesticular mass or evidence of testicular torsion.  2.Hypoechoic nodule in the superficial soft tissues in the area of concern without internal vascularity. This does not appear to have the typical appearance of a lipoma however as the area of concern has been present for multiple years is favored to be benign. Interval follow-up in 3-6 months may be warranted to confirm stability.     1452 XR AP MOBILE CHEST  IMPRESSION:  No acute cardiopulmonary abnormality.        Patient presents to the ER with complaint of abscess that wants drained in his now returned.  Patient's CBC and CMP unremarkable.  Chest x-ray shows no acute finding.  Ultrasound as above.  Patient reports he has been very itchy with antibiotic he was on so we changed antibiotics to clindamycin .  Offered patient I&D again and he declines.  He was able to squeeze a little bit of the wound and provided small sample will attempt to send for wound culture.  Ultrasound will refer patient to Urology.  Discussed finding with patient he is agreeable and advised extensive return ER precautions.  Advised follow-up with PCP for high blood pressure and extensive return  precautions discussed.  All questions answered    Pt remained stable throughout the emergency department course.      Impression:   Clinical Impression   Abscess (Primary)         Disposition: Discharged       Kerman Opal CRNP  08/27/2023 3:44 PM   [facility] Emergency Department

## 2023-08-27 NOTE — ED Nurses Note (Signed)

## 2023-08-29 ENCOUNTER — Telehealth: Payer: Self-pay | Admitting: *Deleted

## 2023-08-29 ENCOUNTER — Other Ambulatory Visit: Payer: Self-pay | Admitting: Internal Medicine

## 2023-08-29 ENCOUNTER — Telehealth: Payer: Self-pay

## 2023-08-29 ENCOUNTER — Ambulatory Visit (HOSPITAL_COMMUNITY): Payer: Self-pay | Admitting: Nurse Practitioner

## 2023-08-29 DIAGNOSIS — I10 Essential (primary) hypertension: Secondary | ICD-10-CM | POA: Insufficient documentation

## 2023-08-29 LAB — WOUND, SUPERFICIAL/NON-STERILE SITE, AEROBIC CULTURE AND GRAM STAIN
GRAM STAIN: NONE SEEN
WOUND CULTURE: NONE SEEN — AB

## 2023-08-29 NOTE — Progress Notes (Signed)
 Care Guide Pharmacy Note  08/29/2023 Name: Jesse Duncan MRN: [id number] DOB: 05-10-1985  Referred By: Joshua Debby CROME, MD Reason for referral: Complex Care Management (Outreach to schedule referral with pharmacist )   Jesse Duncan is a 38 y.o. year old male who is a primary care patient of Joshua Debby CROME, MD.  Jesse Duncan was referred to the pharmacist for assistance related to: DMII  Successful contact was made with the patient to discuss pharmacy services including being ready for the pharmacist to call at least 5 minutes before the scheduled appointment time and to have medication bottles and any blood pressure readings ready for review. The patient agreed to meet with the pharmacist via telephone visit on 09/09/2023  Jesse Duncan, CMA [place]  [facility], [facility] Guide Direct Dial : [phone]  Fax: [phone] Website: [email or web address]

## 2023-08-29 NOTE — Telephone Encounter (Signed)
 Copied from CRM #[id number]. Topic: General - Other >> Aug 29, 2023 10:55 AM Rosina BIRCH wrote: Reason for CRM: patient called stating he was given a pill and the name started with an S and he thinks it was a water pill. Patient want to know why was that prescribed CB [phone]

## 2023-08-29 NOTE — Result Encounter Note (Signed)
 Discharged on Clindamycin which is appropriate antibiotic

## 2023-08-30 ENCOUNTER — Telehealth: Payer: Self-pay

## 2023-08-30 ENCOUNTER — Encounter: Payer: Self-pay | Admitting: Plastic Surgery

## 2023-08-30 ENCOUNTER — Ambulatory Visit (INDEPENDENT_AMBULATORY_CARE_PROVIDER_SITE_OTHER): Admitting: Plastic Surgery

## 2023-08-30 VITALS — BP 154/98 | HR 86 | Ht 73.0 in | Wt 242.0 lb

## 2023-08-30 DIAGNOSIS — N6481 Ptosis of breast: Secondary | ICD-10-CM

## 2023-08-30 DIAGNOSIS — N62 Hypertrophy of breast: Secondary | ICD-10-CM

## 2023-08-30 DIAGNOSIS — E65 Localized adiposity: Secondary | ICD-10-CM | POA: Diagnosis not present

## 2023-08-30 DIAGNOSIS — L987 Excessive and redundant skin and subcutaneous tissue: Secondary | ICD-10-CM

## 2023-08-30 DIAGNOSIS — Z9884 Bariatric surgery status: Secondary | ICD-10-CM

## 2023-08-30 NOTE — Telephone Encounter (Signed)
 Faxed the Request for Surgical Clearance form to patient's PCP, neurologist, and cardiologist with confirmed receipt.  PCP: Dr. Debby Molt Phone: [phone] Fax: [phone]  [facility]: Phone: [phone]  Fax: [phone]  [facility]: Phone: [phone] Fax: [phone]

## 2023-08-30 NOTE — Progress Notes (Signed)
 Referring Provider Jesse Debby CROME, MD [address] [place],  [place] [address]   CC:  Chief Complaint  Patient presents with   Advice Only   Skin Problem      Jesse Duncan is an 38 y.o. male.  HPI: Jesse Duncan is a 38 year old male who presents today with complaints of excess upper skin on his chest after weight loss.  He underwent bariatric surgery approximately 1 year ago and has had a 65 pound weight loss.  The primary area of concern involves the breasts which are now pendulous and frequently have rashes underneath the posterior aspect of the breasts.  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  He is interested in reduction in the size of his breast.  He does have a significant past medical history which includes atrial fibrillation and heart failure.  He reports that he had a stroke a little over a year ago which was ultimately felt to be due to his hypertension.  He is not on any blood thinners and his blood pressure is reasonably well-controlled  No Known Allergies  Outpatient Encounter Medications as of 08/30/2023  Medication Sig   ASPIRIN  PO Take 81 mg by mouth daily.   carvedilol  (COREG ) 12.5 MG tablet Take 1 tablet (12.5 mg total) by mouth 2 (two) times daily with a meal.   Continuous Glucose Receiver (DEXCOM G7 RECEIVER) DEVI 1 Act by Does not apply route daily.   Continuous Glucose Sensor (DEXCOM G7 SENSOR) MISC 1 Act by Does not apply route daily.   ezetimibe  (ZETIA ) 10 MG tablet Take 1 tablet (10 mg total) by mouth daily.   hydrALAZINE  (APRESOLINE ) 25 MG tablet Take 1 tablet (25 mg total) by mouth 3 (three) times daily.   omeprazole (PRILOSEC) 20 MG capsule Take 20 mg by mouth every morning.   rosuvastatin  (CRESTOR ) 40 MG tablet Take 1 tablet (40 mg total) by mouth daily.   tirzepatide  (MOUNJARO ) 12.5 MG/0.5ML Pen Inject 12.5 mg into the skin once a week.   URSODIOL  PO Take 250 mg by mouth daily in the afternoon.   spironolactone  (ALDACTONE ) 25 MG tablet Take 25 mg by mouth daily. (Patient not taking: Reported on 08/30/2023)   No facility-administered encounter medications on file as of 08/30/2023.     Past Medical History:  Diagnosis Date   Allergic rhinitis due to pollen 07/06/2010   Atrial fibrillation (HCC) 08/15/2015   Diabetes mellitus without complication (HCC)    Dyslipidemia    Hyperlipidemia 05/25/2010   Hypertension    Impaired fasting blood sugar 08/15/2015   Obesity    OSA (obstructive sleep apnea) 10/20/2015   Prediabetes    Sleep apnea    Smoker    Tobacco use disorder 11/14/2013    Past Surgical History:  Procedure Laterality Date   BREAST SURGERY      Family History  Problem Relation Age of Onset   Hypertension Mother    Healthy Father    COPD Father    Stroke Maternal Grandmother    Cancer Maternal Grandfather    Heart disease Neg Hx     Social History   Social History Narrative   ** Merged History Encounter **       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        Review of Systems General: Denies fevers, chills, weight loss CV: Denies  chest pain, shortness of breath, palpitations Breast: Large pendulous breasts which interfere with daily activities and frequently have rashes on the posterior aspect  Physical Exam    08/30/2023    8:58 AM 08/23/2023    2:41 PM 05/02/2023    2:11 PM  Vitals with BMI  Height 6' 1 6' 1 6' 1  Weight 242 lbs 247 lbs 10 oz 238 lbs 10 oz  BMI 31.93 32.67 31.49  Systolic 154 152 863  Diastolic 98 102 94  Pulse 86 67 89    General:  No acute distress,  Alert and oriented, Non-Toxic, Normal speech and affect Breast:Patient has Grade 4 gynecomastia with excess tissue extending across the chest and significant ptosis/excess skin. His gynecomastia is visible in clothes with a chest binder Mammogram: N/A  Assessment/Plan Gynecomastia: Patient has significant  gynecomastia as noted above.  He has intermittent rashes and feels that the excess weight causes back pain while he is working.  I believe that he would benefit from excision of the tissue.  Given the amount of excess skin I believe that he will require direct excision of the tissue with free nipple grafting.  I showed him where the incisions would be and we discussed free nipple grafts.  With free nipple grafts there is no guarantee that the nipple will live and if it does there is often hypopigmentation within the areola.  We also discussed the risks of bleeding, infection, and seroma formation.  We discussed the use of drains and compression postoperatively.  Postoperative limitations include no heavy lifting greater than 20 pounds, no vigorous activity, no submerging the incisions in water for 6 weeks.  He may return to light activity as tolerated and you will be encouraged to begin ambulation immediately after surgery.  All questions were answered to his satisfaction.  Photographs were obtained with his consent.  Will submit him for reduction of the breast bilaterally at his request.  He will need cardiology, neurology, and primary care clearance prior to any surgery  Jesse Duncan 08/30/2023, 9:31 AM

## 2023-08-31 NOTE — Telephone Encounter (Signed)
 Patient wants to know why he has to take Spirolactone and years ago it caused him really bad cramping. He hasn't started back taking this medication. I advised him that you weren't the provider who prescribed him this medication he gave a verbal understanding and wants to know what he should do about this medication.

## 2023-08-31 NOTE — Telephone Encounter (Signed)
Who prescribed it

## 2023-09-01 ENCOUNTER — Telehealth: Payer: Self-pay | Admitting: *Deleted

## 2023-09-01 NOTE — Telephone Encounter (Signed)
  Order faxed to [phone],[facility] Ronal Gift, CMA confirmation received.

## 2023-09-02 NOTE — Telephone Encounter (Signed)
 We will have to find something to achieve better BP control

## 2023-09-02 NOTE — Telephone Encounter (Signed)
 A Historical provider. It wasn't you.

## 2023-09-07 ENCOUNTER — Ambulatory Visit
Admission: RE | Admit: 2023-09-07 | Discharge: 2023-09-07 | Disposition: A | Discharge status: Home / Self Care | Source: Ambulatory Visit | Attending: Internal Medicine | Admitting: Internal Medicine

## 2023-09-07 DIAGNOSIS — I10 Essential (primary) hypertension: Secondary | ICD-10-CM

## 2023-09-08 NOTE — Telephone Encounter (Signed)
 Advised patient of Dr. Joshua comments and he gave a verbal understanding

## 2023-09-09 ENCOUNTER — Other Ambulatory Visit (INDEPENDENT_AMBULATORY_CARE_PROVIDER_SITE_OTHER): Admitting: Pharmacist

## 2023-09-09 DIAGNOSIS — I10 Essential (primary) hypertension: Secondary | ICD-10-CM

## 2023-09-09 DIAGNOSIS — E1169 Type 2 diabetes mellitus with other specified complication: Secondary | ICD-10-CM

## 2023-09-09 DIAGNOSIS — E7801 Familial hypercholesterolemia: Secondary | ICD-10-CM

## 2023-09-09 MED ORDER — ROSUVASTATIN CALCIUM 40 MG PO TABS: 40.0000 mg | ORAL_TABLET | Freq: Every day | ORAL | 1 refills | Status: AC

## 2023-09-09 MED ORDER — VALSARTAN-HYDROCHLOROTHIAZIDE 160-25 MG PO TABS: 1.0000 | ORAL_TABLET | Freq: Every day | ORAL | 1 refills | Status: DC

## 2023-09-09 MED ORDER — EZETIMIBE 10 MG PO TABS: 10.0000 mg | ORAL_TABLET | Freq: Every day | ORAL | 1 refills | Status: AC

## 2023-09-09 NOTE — Patient Instructions (Signed)
 It was a pleasure speaking with you today!  Start valsartan /hydrochlorothiazide , which will replace triamterene /hydrochlorothiazide  for blood pressure. Continue monitoring blood pressure at home.  I have sent refills for rosuvastatin  and ezetimibe .  I will call back 9/19 at 3 PM for follow up.  Feel free to call with any questions or concerns!  Darrelyn Drum, PharmD, BCPS, CPP Clinical Pharmacist Practitioner [facility] [phone]

## 2023-09-09 NOTE — Progress Notes (Signed)
 09/09/2023 Name: Jesse Duncan MRN: [id number] DOB: 1985-10-21  Chief Complaint  Patient presents with   Hypertension   Medication Management    Jesse Duncan is a 38 y.o. year old male who presented for a telephone visit.   They were referred to the pharmacist by their PCP for assistance in managing hypertension.   Subjective:  Care Team: Primary Care Provider: Joshua Debby CROME, MD    Medication Access/Adherence  [facility]  [phone] [address], [place] - [address] [place] [address] Phone: [phone] Fax: [phone]  [facility] [address] [place] [address] Phone: [phone] Fax: [phone]   Patient reports affordability concerns with their medications: No  Patient reports access/transportation concerns to their pharmacy: No  Patient reports adherence concerns with their medications:  Yes    Pt is unclear what to take for diabetes - stopped insulin  due to lows, still taking metformin , wants to continue taking Mounjaro  to help with weight loss but has no refills   HTN:  Current medications: carvedilol  12.5 mg twice daily, hydralazine  25 mg TID, triamterene /hydrochlorothiazide  37.5/25 mg daily  *Pt currently has spironolactone  on med list however he has not taken that, telephone note shows pt did not want to take due to hx of cramps when taking in the past. PCP sent Rx 08/26/23 and stopped triamterene /hydrochlorothiazide  however pt has still been taking. Unclear why it was d/c.  *Notes he has gotten better about taking hydralazine  TID  Patient has a validated, automated, upper arm home BP cuff Current blood pressure readings readings: 130-150/90-100  Diet: No caffeine Watching sodium  Objective:  BP Readings from Last 3 Encounters:  08/30/23 (!) 154/98  08/23/23 (!) 152/102  05/02/23 (!) 136/94     Lab Results  Component Value Date   HGBA1C 4.7  05/02/2023    Lab Results  Component Value Date   CREATININE 0.88 08/23/2023   BUN 11 08/23/2023   NA 140 08/23/2023   K 3.7 08/23/2023   CL 105 08/23/2023   CO2 29 08/23/2023    Lab Results  Component Value Date   CHOL 163 09/22/2022   HDL 40.20 09/22/2022   LDLCALC 79 09/22/2022   LDLDIRECT 223.0 04/08/2022   TRIG 217.0 (H) 09/22/2022   CHOLHDL 4 09/22/2022    Medications Reviewed Today     Reviewed by Jesse Duncan, RPH (Pharmacist) on 09/09/23 at 1425  Med List Status: <None>   Medication Order Taking? Sig Documenting Provider Last Dose Status Informant  ASPIRIN  PO [id number]  Take 81 mg by mouth daily. [provider]  Active   carvedilol  (COREG ) 12.5 MG tablet [id number] Yes Take 1 tablet (12.5 mg total) by mouth 2 (two) times daily with a meal. Jesse Debby CROME, MD  Active   Continuous Glucose Receiver [facility] G7 RECEIVER) DEVI [id number]  1 Act by Does not apply route daily. Jesse Debby CROME, MD  Active   Continuous Glucose Sensor (DEXCOM G7 SENSOR) [place] [id number]  1 Act by Does not apply route daily. Jesse Debby CROME, MD  Active   ezetimibe  (ZETIA ) 10 MG tablet [id number]  Take 1 tablet (10 mg total) by mouth daily. Jesse Ronal BRAVO, MD  Active   hydrALAZINE  (APRESOLINE ) 25 MG tablet [id number] Yes Take 1 tablet (25 mg total) by mouth 3 (three) times daily. Jesse Debby CROME, MD  Active   omeprazole (PRILOSEC) 20 MG capsule [id number]  Take 20 mg by mouth every  morning. [provider]  Active   rosuvastatin  (CRESTOR ) 40 MG tablet [id number]  Take 1 tablet (40 mg total) by mouth daily. Jesse Debby CROME, MD  Active    Patient not taking:   Discontinued 09/09/23 1425   tirzepatide  (MOUNJARO ) 12.5 MG/0.5ML Pen [id number] Yes Inject 12.5 mg into the skin once a week. Jesse Debby CROME, MD  Active   URSODIOL PO [id number]  Take 250 mg by mouth daily in the afternoon. [provider]  Active               Assessment/Plan:    Hypertension: -  Currently uncontrolled, BP goal <130/80.  - Recommend to change triamterene /hydrochlorothiazide  to valsartan  160/25 mg daily. Check BMP in 2-4 weeks and if Scr/K stable, can increase to 320 mg/25 mg daily. Treatment with an ARB is preferred given hx of CHF  Refills of rosuvastatin  and ezetimibe  sent  Follow Up Plan: 9/19  Jesse Duncan, PharmD, BCPS, CPP Clinical Pharmacist Practitioner [place] [facility] at [facility] [phone]

## 2023-09-23 ENCOUNTER — Other Ambulatory Visit (INDEPENDENT_AMBULATORY_CARE_PROVIDER_SITE_OTHER): Admitting: Pharmacist

## 2023-09-23 DIAGNOSIS — I10 Essential (primary) hypertension: Secondary | ICD-10-CM

## 2023-09-23 NOTE — Patient Instructions (Signed)
 It was a pleasure speaking with you today!  Continue current regimen. Come next week for walk-in labwork.  Feel free to call with any questions or concerns!  Darrelyn Drum, PharmD, BCPS, CPP Clinical Pharmacist Practitioner [place] [facility] at [facility] [phone]

## 2023-09-23 NOTE — Progress Notes (Signed)
 09/23/2023 Name: Jesse Duncan MRN: [id number] DOB: 1985/09/27  Chief Complaint  Patient presents with   Hypertension   Medication Management    Jesse Duncan is a 38 y.o. year old male who presented for a telephone visit.   They were referred to the pharmacist by their PCP for assistance in managing hypertension.   Subjective:  Care Team: Primary Care Provider: Joshua Debby CROME, MD    Medication Access/Adherence  [facility]  [phone] [address], [place] - [address] [place] [address] Phone: [phone] Fax: [phone]  [facility] [address] [place] [address] Phone: [phone] Fax: [phone]   Patient reports affordability concerns with their medications: No  Patient reports access/transportation concerns to their pharmacy: No  Patient reports adherence concerns with their medications:  Yes    HTN:  Current medications: carvedilol  12.5 mg twice daily, hydralazine  25 mg TID, valsartan /hydrochlorothiazide  160/25 mg daily  *Pt currently has spironolactone  on med list however he has not taken that, telephone note shows pt did not want to take due to hx of cramps when taking in the past. PCP sent Rx 08/26/23 and stopped triamterene /hydrochlorothiazide  however pt has still been taking. Unclear why it was d/c.  *Notes he has gotten better about taking hydralazine  TID  Patient has a validated, automated, upper arm home BP cuff Current blood pressure readings readings: 138/80 recently (improved from 140-150s/90s)  Diet: No caffeine Watching sodium  Objective:  BP Readings from Last 3 Encounters:  08/30/23 (!) 154/98  08/23/23 (!) 152/102  05/02/23 (!) 136/94     Lab Results  Component Value Date   HGBA1C 4.7 05/02/2023    Lab Results  Component Value Date   CREATININE 0.88 08/23/2023   BUN 11 08/23/2023   NA 140 08/23/2023   K 3.7 08/23/2023   CL  105 08/23/2023   CO2 29 08/23/2023    Lab Results  Component Value Date   CHOL 163 09/22/2022   HDL 40.20 09/22/2022   LDLCALC 79 09/22/2022   LDLDIRECT 223.0 04/08/2022   TRIG 217.0 (H) 09/22/2022   CHOLHDL 4 09/22/2022    Medications Reviewed Today     Reviewed by Jesse Duncan, RPH (Pharmacist) on 09/23/23 at 1533  Med List Status: <None>   Medication Order Taking? Sig Documenting Provider Last Dose Status Informant  ASPIRIN  PO [id number]  Take 81 mg by mouth daily. [provider]  Active   carvedilol  (COREG ) 12.5 MG tablet [id number] Yes Take 1 tablet (12.5 mg total) by mouth 2 (two) times daily with a meal. Jesse Debby CROME, MD  Active   Continuous Glucose Receiver [facility] G7 RECEIVER) DEVI [id number]  1 Act by Does not apply route daily. Jesse Debby CROME, MD  Active   Continuous Glucose Sensor (DEXCOM G7 SENSOR) [place] [id number]  1 Act by Does not apply route daily. Jesse Debby CROME, MD  Active   ezetimibe  (ZETIA ) 10 MG tablet [id number]  Take 1 tablet (10 mg total) by mouth daily. Jesse Debby CROME, MD  Active   hydrALAZINE  (APRESOLINE ) 25 MG tablet [id number] Yes Take 1 tablet (25 mg total) by mouth 3 (three) times daily. Jesse Debby CROME, MD  Active   omeprazole (PRILOSEC) 20 MG capsule [id number]  Take 20 mg by mouth every morning. [provider]  Active   rosuvastatin  (CRESTOR ) 40 MG tablet [id number]  Take 1 tablet (40 mg total) by mouth daily. Jesse Debby CROME, MD  Active   tirzepatide  (MOUNJARO ) 12.5 MG/0.5ML Pen [id number]  Inject 12.5 mg into the skin once a week. Jesse Debby CROME, MD  Active   URSODIOL PO [id number]  Take 250 mg by mouth daily in the afternoon. [provider]  Active   valsartan -hydrochlorothiazide  (DIOVAN -HCT) 160-25 MG tablet [id number] Yes Take 1 tablet by mouth daily. Jesse Debby CROME, MD  Active               Assessment/Plan:    Hypertension: - Currently uncontrolled, BP goal <130/80. Improved. - Recommend to continue  current regimen - Check BMP to check Scr/K stable, can increase to 320 mg/25 mg daily if stable and if needed. Treatment with an ARB is preferred given hx of CHF  Follow Up Plan: Walk-in labs next week  Jesse Duncan, PharmD, BCPS, CPP Clinical Pharmacist Practitioner [facility] [facility] at [facility] [phone]

## 2023-10-11 ENCOUNTER — Ambulatory Visit: Admitting: Surgical

## 2023-10-11 ENCOUNTER — Encounter: Payer: Self-pay | Admitting: Surgical

## 2023-10-11 ENCOUNTER — Other Ambulatory Visit: Payer: Self-pay

## 2023-10-11 ENCOUNTER — Emergency Department
Admission: EM | Admit: 2023-10-11 | Discharge: 2023-10-11 | Discharge status: Against Medical Advice | Payer: MEDICAID | Attending: Emergency Medicine | Admitting: Emergency Medicine

## 2023-10-11 ENCOUNTER — Encounter (HOSPITAL_COMMUNITY): Payer: Self-pay

## 2023-10-11 VITALS — BP 150/104 | HR 73 | Ht 73.0 in | Wt 233.6 lb

## 2023-10-11 DIAGNOSIS — N6481 Ptosis of breast: Secondary | ICD-10-CM | POA: Diagnosis not present

## 2023-10-11 DIAGNOSIS — Z6830 Body mass index (BMI) 30.0-30.9, adult: Secondary | ICD-10-CM

## 2023-10-11 DIAGNOSIS — E65 Localized adiposity: Secondary | ICD-10-CM | POA: Diagnosis not present

## 2023-10-11 DIAGNOSIS — L987 Excessive and redundant skin and subcutaneous tissue: Secondary | ICD-10-CM

## 2023-10-11 DIAGNOSIS — E569 Vitamin deficiency, unspecified: Secondary | ICD-10-CM | POA: Diagnosis not present

## 2023-10-11 DIAGNOSIS — N62 Hypertrophy of breast: Secondary | ICD-10-CM | POA: Diagnosis not present

## 2023-10-11 DIAGNOSIS — R4589 Other symptoms and signs involving emotional state: Secondary | ICD-10-CM

## 2023-10-11 DIAGNOSIS — F419 Anxiety disorder, unspecified: Secondary | ICD-10-CM | POA: Insufficient documentation

## 2023-10-11 NOTE — Progress Notes (Signed)
   Referring Provider Joshua Debby CROME, MD [address] [place],  [place] [address]   CC:  Chief Complaint  Patient presents with   Follow-up      Jesse Duncan is an 38 y.o. male.  HPI: Patient is a 38 year old male here for follow-up to discuss excess skin, gynecomastia after weight loss.  He has had significant weight loss and has continued to lose weight.  He did have surgical clearances sent after his last appointment/consultation with Dr. Waddell.  PCP was not able to provide clearance due to patient's blood pressure being elevated.  His blood pressure today in office is 150/104.  He does report that he had poor sleep last night.  He does report that when he checks his blood pressure at home it is better and usually around 140/90.  He also reports that he is continuing to lose weight, he was 242 pounds on 08/30/2023, today he has 233 pounds.  Review of Systems General: No fevers or chills  Physical Exam    10/11/2023   11:49 AM 08/30/2023    8:58 AM 08/23/2023    2:41 PM  Vitals with BMI  Height 6' 1 6' 1 6' 1  Weight 233 lbs 10 oz 242 lbs 247 lbs 10 oz  BMI 30.83 31.93 32.67  Systolic 150 154 847  Diastolic 104 98 102  Pulse 73 86 67    General:  No acute distress,  Alert and oriented, Non-Toxic, Normal speech and affect    Assessment/Plan Patient is a 38 year old male here for reevaluation after consultation with Dr. Waddell for excess skin, gynecomastia.  He was here today for weight check as he was continuing to lose weight.  He has lost an additional 9 pounds.  His BMI today is 30.83, down from 31.93.  We did discuss that he was not cleared to proceed with surgery due to his blood pressures being elevated.  We discussed following up with PCP to discuss improving blood pressure control and following up with our office again in 2 months for reevaluation.  We also discussed that if he continues to lose weight, or is working on continuing to lose more weight he  should wait to proceed with surgery as he will continue to notice changes due to loss of potential volume in his chest and skin laxity due to weight loss.  Jesse Duncan 10/11/2023, 12:47 PM

## 2023-10-11 NOTE — ED Nurses Note (Signed)
 Pt arrived with c/o small parasites in his body that showed up while he was shaving today. After DO informed pt that there are no blood tests or scans to be done to identify parasites in his body he stated well i am going to leave and go find a doctor who will at least try. Pt stood and began to leave AMA, pt refused to sign AMA papers and proceeded to leave the ER.

## 2023-10-11 NOTE — ED Provider Notes (Incomplete)
 [facility] - Emergency Department  ED Primary Provider Note  History of Present Illness   Chief Complaint   Patient presents with   . Medical Screening     Pt states I was shaving my neck tonight and saw a worm coming out of my neck. Think I have parasites all over in my body     Arrival: The patient arrived by Car    [name] is a 38 y.o. male who      Review of Systems   All systems reviewed and are negative except as noted in HPI      Historical Data   History Reviewed This Encounter: Medical History  Surgical History  Family History  Social History    Physical Exam   ED Triage Vitals [10/11/23 2242]   BP (Non-Invasive) (!) 147/96   Heart Rate 97   Respiratory Rate 20   Temperature 36.9 C (98.4 F)   SpO2 97 %   Weight 83 kg (183 lb 1.5 oz)   Height 1.753 m (5' 9)         Constitutional:  38 y.o. male who appears in no distress. Awake and alert.  HEENT: Normocephalic and atraumatic. Mouth/Throat: Oropharynx is clear and moist.   Eyes: EOMI, normal conjunctiva  Neck: Trachea midline. Neck supple.  Cardiovascular: Regular rate, regular rhythm. Normal peripheral perfusion.  Pulmonary/Chest: Chest rise equal bilaterally. No respiratory distress.   Abdominal: Abdomen soft, no tenderness, no rebound and no guarding.  Musculoskeletal: No edema, tenderness or deformity.  Skin: Warm and dry. No rash.  Psychiatric: normal mood and affect. Behavior is normal.   Neurological: A&Ox4. No focal deficits.  Patient Data   {Click here to open the ED Workup Activity for clinical data review *This link will automatically disappear upon signing your note*:123}Labs Ordered/Reviewed - No data to display  No orders to display     EKG:  No results found for this visit on 10/11/23 (from the past 720 hours).  Medical Decision Making      {Be sure to fill out the MDM SmartBlock in Notewriter to the left. Do not modify this italicized text, it will disappear upon signing your note:123}  Medical Decision  Making              Clinical Impression   Anxiety about health (Primary)       Disposition: Discharged  {Critical Care Time (Optional):37527}     {Remember to refresh your note prior to signing. Use Control + F11 or click the refresh button at the bottom of the note. This reminder text will automatically disappear when you sign your note.:123}

## 2023-10-11 NOTE — ED Nurses Note (Signed)
 Pt was agreeable to IV, RN placed 20G IV in right AC.

## 2023-10-11 NOTE — ED Provider Notes (Signed)
 [facility] - Emergency Department  ED Primary Provider Note  History of Present Illness   Chief Complaint   Patient presents with    Medical Screening     Pt states I was shaving my neck tonight and saw a worm coming out of my neck. Think I have parasites all over in my body     Arrival: The patient arrived by Car    [name] is a 38 y.o. male with history of HTN, hepatitis C, depression who presents stating he thinks he has a parasite.  He says he saw worms coming out of his skin after shaving the hair on his neck tonight.  Has not shaved his neck hair in a while.  He saw white discoloration of the skin and upon closer evaluation this looked to be small worms.  He is concerned this could be related to previous skin infections he had on his legs.  He is wondering if he can have a blood test or some kind of imaging to find worms.  Denies any itchiness of his skin.    Review of Systems   All systems reviewed and are negative except as noted in HPI      Historical Data   History Reviewed This Encounter: Medical History  Surgical History  Family History  Social History    Physical Exam   ED Triage Vitals [10/11/23 2242]   BP (Non-Invasive) (!) 147/96   Heart Rate 97   Respiratory Rate 20   Temperature 36.9 C (98.4 F)   SpO2 97 %   Weight 83 kg (183 lb 1.5 oz)   Height 1.753 m (5' 9)         Constitutional:  38 y.o. male who appears in no distress. Awake and alert.  HEENT: Normocephalic and atraumatic. Mouth/Throat: Oropharynx is clear and moist.   Eyes: EOMI, normal conjunctiva  Cardiovascular: Regular rate, normal peripheral perfusion.  Pulmonary/Chest: Chest rise equal bilaterally. No respiratory distress.   Skin: Warm and dry. No rash.   Psychiatric: Pressured speech. Slight paranoia. Otherwise normal behavior.  Neurological: A&Ox4. No focal deficits.  Patient Data   Labs Ordered/Reviewed - No data to display  No orders to display     EKG:  No results found for this visit on 10/11/23  (from the past 720 hours).  Medical Decision Making        Medical Decision Making  38 year old male here with the above presentation.  There were no visible worms on the patient's external neck.  This is not a likely location where one would find worms.  Patient denies having any moving bugs that he saw or lice.  He does have children and says the children do not currently have head lice.  He says he had not seen anything in his facial hair prior to that.    Patient advised that I do not have any direct testing that I could perform in the ER to tell him whether or not he had worms.  Of course stool cultures can be obtained, though I have very low suspicion this patient has any actual type of parasitic infection.  Patient stable for discharge home and advised to follow up with primary doctor.  However, patient stated that he was not happy with this because he wanted tests done and left the emergency department without receiving discharge papers.                Clinical Impression   Anxiety about health (  Primary)       Disposition: Discharged

## 2023-10-14 ENCOUNTER — Other Ambulatory Visit: Payer: Self-pay | Admitting: Pharmacist

## 2023-10-14 DIAGNOSIS — I10 Essential (primary) hypertension: Secondary | ICD-10-CM

## 2023-10-14 MED ORDER — VALSARTAN-HYDROCHLOROTHIAZIDE 320-25 MG PO TABS: 1.0000 | ORAL_TABLET | Freq: Every day | ORAL | 0 refills | Status: DC

## 2023-10-14 NOTE — Progress Notes (Signed)
   10/14/2023 Name: Jesse Duncan MRN: [id number] DOB: [birth year]  Chief Complaint  Patient presents with   Hypertension   Medication Management    Sylvan Sookdeo is a 38 y.o. year old male who presented for a telephone visit.   They were referred to the pharmacist by their PCP for assistance in managing hypertension.    Subjective:  Care Team: Primary Care Provider: Joshua Debby CROME, MD    Medication Access/Adherence  [facility]  [phone] [address], [place] - [address] [place] [address] Phone: [phone] Fax: [phone]  [facility] [address] [place] [address] Phone: [phone] Fax: [phone]   Patient reports affordability concerns with their medications: No  Patient reports access/transportation concerns to their pharmacy: No  Patient reports adherence concerns with their medications:  Yes    HTN:  Current medications: carvedilol  12.5 mg twice daily, hydralazine  25 mg TID, valsartan /hydrochlorothiazide  160/25 mg daily   Needing BP better controlled to be cleared for surgery  *Notes he has gotten better about taking hydralazine  TID  Patient has a validated, automated, upper arm home BP cuff Current blood pressure readings readings: 138/85, another day was in the 160s systolic 150/104 at OV on 10/7  Diet: No caffeine Watching sodium  Objective:  BP Readings from Last 3 Encounters:  10/11/23 (!) 150/104  08/30/23 (!) 154/98  08/23/23 (!) 152/102     Lab Results  Component Value Date   HGBA1C 4.7 05/02/2023    Lab Results  Component Value Date   CREATININE 0.88 08/23/2023   BUN 11 08/23/2023   NA 140 08/23/2023   K 3.7 08/23/2023   CL 105 08/23/2023   CO2 29 08/23/2023    Lab Results  Component Value Date   CHOL 163 09/22/2022   HDL 40.20 09/22/2022   LDLCALC 79 09/22/2022   LDLDIRECT 223.0 04/08/2022   TRIG 217.0 (H)  09/22/2022   CHOLHDL 4 09/22/2022    Medications Reviewed Today   Medications were not reviewed in this encounter       Assessment/Plan:    Hypertension: - Currently uncontrolled, BP goal <130/80.  - Recommend to continue current regimen - Labs were checked 10/7 through [facility], CMP checked and show K was WNL and Scr WNL and stable. - Recommend increase valsartan /hydrochlorothiazide  to 320/25 mg daily - F/u in 3 weeks for OV BP check  Follow Up Plan: 11/3  Darrelyn Drum, PharmD, BCPS, CPP Clinical Pharmacist Practitioner [facility] at [facility] [phone]

## 2023-10-20 ENCOUNTER — Emergency Department
Admission: EM | Admit: 2023-10-20 | Discharge: 2023-10-20 | Disposition: A | Discharge status: Home / Self Care | Attending: Emergency Medicine | Admitting: Emergency Medicine

## 2023-10-20 ENCOUNTER — Emergency Department (HOSPITAL_COMMUNITY)

## 2023-10-20 ENCOUNTER — Other Ambulatory Visit: Payer: Self-pay

## 2023-10-20 DIAGNOSIS — L089 Local infection of the skin and subcutaneous tissue, unspecified: Secondary | ICD-10-CM

## 2023-10-20 DIAGNOSIS — L03113 Cellulitis of right upper limb: Secondary | ICD-10-CM | POA: Insufficient documentation

## 2023-10-20 DIAGNOSIS — T148XXA Other injury of unspecified body region, initial encounter: Secondary | ICD-10-CM

## 2023-10-20 DIAGNOSIS — S60511A Abrasion of right hand, initial encounter: Secondary | ICD-10-CM | POA: Insufficient documentation

## 2023-10-20 DIAGNOSIS — M7989 Other specified soft tissue disorders: Secondary | ICD-10-CM

## 2023-10-20 DIAGNOSIS — X58XXXA Exposure to other specified factors, initial encounter: Secondary | ICD-10-CM | POA: Insufficient documentation

## 2023-10-20 DIAGNOSIS — L0390 Cellulitis, unspecified: Secondary | ICD-10-CM

## 2023-10-20 DIAGNOSIS — S60512A Abrasion of left hand, initial encounter: Secondary | ICD-10-CM | POA: Insufficient documentation

## 2023-10-20 DIAGNOSIS — L03114 Cellulitis of left upper limb: Secondary | ICD-10-CM | POA: Insufficient documentation

## 2023-10-20 LAB — BASIC METABOLIC PANEL
ANION GAP: 10 mmol/L (ref 4–13)
BUN/CREA RATIO: 15 (ref 6–22)
BUN: 14 mg/dL (ref 8–25)
CALCIUM: 9.1 mg/dL (ref 8.6–10.2)
CHLORIDE: 106 mmol/L (ref 96–111)
CO2 TOTAL: 24 mmol/L (ref 22–30)
CREATININE: 0.92 mg/dL (ref 0.75–1.35)
GLUCOSE: 88 mg/dL (ref 65–125)
POTASSIUM: 3.2 mmol/L — ABNORMAL LOW (ref 3.5–5.1)
SODIUM: 140 mmol/L (ref 136–145)
eGFRcr - MALE: >90 mL/min/1.73mˆ2 (ref >=60)

## 2023-10-20 LAB — CBC WITH DIFF
BASOPHIL #: <0.1 x10ˆ3/uL (ref <=0.20)
BASOPHIL %: 0.3 %
EOSINOPHIL #: 0.13 x10ˆ3/uL (ref <=0.50)
EOSINOPHIL %: 1.8 %
HCT: 39.5 % (ref 38.9–52.0)
HGB: 13.9 g/dL (ref 13.4–17.5)
IMMATURE GRANULOCYTE #: <0.1 x10ˆ3/uL (ref <0.10)
IMMATURE GRANULOCYTE %: 0.1 % (ref 0.0–1.0)
LYMPHOCYTE #: 2.88 x10ˆ3/uL (ref 1.00–4.80)
LYMPHOCYTE %: 39.7 %
MCH: 28.6 pg (ref 26.0–32.0)
MCHC: 35.2 g/dL (ref 31.0–35.5)
MCV: 81.3 fL (ref 78.0–100.0)
MONOCYTE #: 0.71 x10ˆ3/uL (ref 0.20–1.10)
MONOCYTE %: 9.8 %
MPV: 10.1 fL (ref 8.7–12.5)
NEUTROPHIL #: 3.5 x10ˆ3/uL (ref 1.50–7.70)
NEUTROPHIL %: 48.3 %
PLATELETS: 254 x10ˆ3/uL (ref 150–400)
RBC: 4.86 x10ˆ6/uL (ref 4.50–6.10)
RDW-CV: 11.9 % (ref 11.5–15.5)
WBC: 7.3 x10ˆ3/uL (ref 3.7–11.0)

## 2023-10-20 LAB — HEPATIC FUNCTION PANEL
ALBUMIN: 4.3 g/dL (ref 3.5–5.0)
ALKALINE PHOSPHATASE: 96 U/L (ref 45–115)
ALT (SGPT): 36 U/L (ref <43)
AST (SGOT): 47 U/L — ABNORMAL HIGH (ref 11–34)
BILIRUBIN DIRECT: 0.2 mg/dL (ref 0.1–0.4)
BILIRUBIN TOTAL: 0.5 mg/dL (ref 0.3–1.3)
PROTEIN TOTAL: 7.2 g/dL (ref 6.4–8.3)

## 2023-10-20 LAB — C-REACTIVE PROTEIN(CRP),INFLAMMATION: CRP INFLAMMATION: 1.9 mg/L (ref <8.0)

## 2023-10-20 LAB — MRSA SCREEN, PCR, RAPID: MRSA COLONIZATION SCREEN: POSITIVE — AB

## 2023-10-20 MED ORDER — SULFAMETHOXAZOLE 800 MG-TRIMETHOPRIM 160 MG TABLET
1.0000 | ORAL_TABLET | Freq: Two times a day (BID) | ORAL | 0 refills | Status: AC
Start: 2023-10-20 — End: 2023-10-27

## 2023-10-20 MED ORDER — DOXYCYCLINE HYCLATE 100 MG CAPSULE
100.0000 mg | ORAL_CAPSULE | Freq: Two times a day (BID) | ORAL | 0 refills | Status: AC
Start: 2023-10-20 — End: 2023-10-30

## 2023-10-20 NOTE — ED Nurses Note (Signed)
 Discharge patient to home.  Discharge instruction given.  Patient verbalized understanding.  Patient left ambulatory to home by self.

## 2023-10-20 NOTE — Discharge Instructions (Signed)
 Return as needed for fever, worsening swelling, or other concerns. You can take antibiotics as prescribed.

## 2023-10-20 NOTE — ED Triage Notes (Addendum)
 [facility] - Emergency Department   Physician/APP in Triage Note  Medical Screening Exam     Date and Time of Assessment: 10/20/2023 18:36     Chief Complaint   Patient presents with    Sore     Sores on bilat hands. Reports fly larva coming out of sores on arm for 4-5 days. Endorses. Denies fevers, chills, N/V/D. Denies drug use or injection, drinks socially. Alert and oriented.        Brief HPI: Pt concerned he has parasites. Sores on hands, started about 4-5 days ago, areas have drainage, thinks they look like fly larvae.   No fevers. Not really feeling sick otherwise.   [place]. No specific trauma.  Snorts methamphetamines occasionally. No IVDU. 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   Seen at [facility] for same recently.         Focused Physical Exam:   ED Triage Vitals   BP (Non-Invasive) 10/20/23 1611 (!) 154/105   Heart Rate 10/20/23 1611 96   Respiratory Rate 10/20/23 1611 18   Temperature 10/20/23 1611 36.7 C (98.1 F)   SpO2 10/20/23 1611 97 %   Weight 10/20/23 1610 84.1 kg (185 lb 6.5 oz)   Height 10/20/23 1610 1.753 m (5' 9)   RRR  Lungs CTAB  Distal pulses normal  Sites without fluctance  Moving fingers well                    Preliminary Plan:  Labs ordered  Imaging ordered  Patient will return to waiting room  POCUS without abscess  He is convinced he has parasites. Discussed that methamphetamines could be contributing. May have some local skin infection.   Reports no IVDU. Not systemically ill.   Likely give PO abx for local skin infection around scabs.

## 2023-10-20 NOTE — ED Provider Notes (Signed)
 Patient seen by me as provider in triage. Please refer to provider in triage note for history and physical exam, as well as additional MDM.          MDM:    Additional history provided by chart review    I independently reviewed ED imaging and my findings are congruent with the radiology interpretation    Hemodynamic and pulse ox monitoring    Independently reviewed all ED labs resulted during the visit, and contextual historic labs      Reviewed prior records, imaging and lab results and they are congruent with the patient's stated history          Patient requesting discharge. I feel this is reasonable, not systemically ill, no leukocytosis. No abscess seen on POCUS, no superficial thrombophlebitis at site of redness either. Will prescribe abx for local skin infection. Discharged from triage.

## 2023-10-24 DIAGNOSIS — F4320 Adjustment disorder, unspecified: Secondary | ICD-10-CM | POA: Diagnosis not present

## 2023-10-25 DIAGNOSIS — Z6831 Body mass index (BMI) 31.0-31.9, adult: Secondary | ICD-10-CM | POA: Diagnosis not present

## 2023-10-25 DIAGNOSIS — E669 Obesity, unspecified: Secondary | ICD-10-CM | POA: Diagnosis not present

## 2023-10-25 DIAGNOSIS — Z903 Acquired absence of stomach [part of]: Secondary | ICD-10-CM | POA: Diagnosis not present

## 2023-11-02 ENCOUNTER — Ambulatory Visit (INDEPENDENT_AMBULATORY_CARE_PROVIDER_SITE_OTHER): Payer: Self-pay | Admitting: Family

## 2023-11-07 ENCOUNTER — Ambulatory Visit (INDEPENDENT_AMBULATORY_CARE_PROVIDER_SITE_OTHER): Admitting: Pharmacist

## 2023-11-07 ENCOUNTER — Other Ambulatory Visit

## 2023-11-07 VITALS — BP 122/76

## 2023-11-07 DIAGNOSIS — E785 Hyperlipidemia, unspecified: Secondary | ICD-10-CM | POA: Diagnosis not present

## 2023-11-07 DIAGNOSIS — I10 Essential (primary) hypertension: Secondary | ICD-10-CM | POA: Diagnosis not present

## 2023-11-07 DIAGNOSIS — E1169 Type 2 diabetes mellitus with other specified complication: Secondary | ICD-10-CM

## 2023-11-07 LAB — LIPID PANEL
Cholesterol: 137 mg/dL (ref 0–200)
HDL: 51 mg/dL (ref >39.00)
LDL Cholesterol: 68 mg/dL (ref 0–99)
NonHDL: 85.61
Total CHOL/HDL Ratio: 3
Triglycerides: 87 mg/dL (ref 0.0–149.0)
VLDL: 17.4 mg/dL (ref 0.0–40.0)

## 2023-11-07 LAB — BASIC METABOLIC PANEL WITH GFR
BUN: 25 mg/dL — ABNORMAL HIGH (ref 6–23)
CO2: 29 meq/L (ref 19–32)
Calcium: 9.8 mg/dL (ref 8.4–10.5)
Chloride: 102 meq/L (ref 96–112)
Creatinine, Ser: 1.02 mg/dL (ref 0.40–1.50)
GFR: 93.94 mL/min (ref >60.00)
Glucose, Bld: 84 mg/dL (ref 70–99)
Potassium: 3.6 meq/L (ref 3.5–5.1)
Sodium: 139 meq/L (ref 135–145)

## 2023-11-07 NOTE — Patient Instructions (Signed)
 It was a pleasure speaking with you today!  Continue current regimen. Check labs today.  Feel free to call with any questions or concerns!  Darrelyn Drum, PharmD, BCPS, CPP Clinical Pharmacist Practitioner [facility] [phone]

## 2023-11-07 NOTE — Progress Notes (Signed)
 11/07/2023 Name: Jesse Duncan MRN: [id number] DOB: 05-11-1985  Chief Complaint  Patient presents with   Hypertension   Medication Management    Mikle Sternberg is a 38 y.o. year old male who presented for a telephone visit.   They were referred to the pharmacist by their PCP for assistance in managing hypertension.    Subjective:  Care Team: Primary Care Provider: Joshua Debby CROME, MD    Medication Access/Adherence  Current Pharmacy:  [phone] [address], [place] - [address] [place] [address] Phone: [phone] Fax: [phone]  [facility] [address] [place] [address] Phone: [phone] Fax: [phone]   Patient reports affordability concerns with their medications: No  Patient reports access/transportation concerns to their pharmacy: No  Patient reports adherence concerns with their medications:  Yes    HTN:  Current medications: carvedilol  12.5 mg twice daily, hydralazine  25 mg TID, valsartan /hydrochlorothiazide  320/25 mg daily   Needing BP better controlled to be cleared for surgery  Had appt on 10/20 through [facility]. BP was very elevated at that appointment but he had not taken his medications yet.  *Notes he has gotten better about taking hydralazine  TID  Patient has a validated, automated, upper arm home BP cuff Current blood pressure readings readings:  139/76 this morning 120/80   Diet: No caffeine Watching sodium  Objective:  BP Readings from Last 3 Encounters:  11/07/23 122/76  10/11/23 (!) 150/104  08/30/23 (!) 154/98     Lab Results  Component Value Date   HGBA1C 4.7 05/02/2023    Lab Results  Component Value Date   CREATININE 0.88 08/23/2023   BUN 11 08/23/2023   NA 140 08/23/2023   K 3.7 08/23/2023   CL 105 08/23/2023   CO2 29 08/23/2023    Lab Results  Component Value Date   CHOL 163 09/22/2022   HDL 40.20 09/22/2022    LDLCALC 79 09/22/2022   LDLDIRECT 223.0 04/08/2022   TRIG 217.0 (H) 09/22/2022   CHOLHDL 4 09/22/2022    Medications Reviewed Today     Reviewed by Merceda Lela SAUNDERS, RPH (Pharmacist) on 11/07/23 at 1556  Med List Status: <None>   Medication Order Taking? Sig Documenting Provider Last Dose Status Informant  ASPIRIN  PO [id number]  Take 81 mg by mouth daily. [provider]  Active   carvedilol  (COREG ) 12.5 MG tablet [id number] Yes Take 1 tablet (12.5 mg total) by mouth 2 (two) times daily with a meal. Joshua Debby CROME, MD  Active   Continuous Glucose Receiver [facility] G7 RECEIVER) [place] [id number]  1 Act by Does not apply route daily. Joshua Debby CROME, MD  Active   Continuous Glucose Sensor (DEXCOM G7 SENSOR) [place] [id number]  1 Act by Does not apply route daily. Joshua Debby CROME, MD  Active   ezetimibe  (ZETIA ) 10 MG tablet [id number]  Take 1 tablet (10 mg total) by mouth daily. Joshua Debby CROME, MD  Active   hydrALAZINE  (APRESOLINE ) 25 MG tablet [id number] Yes Take 1 tablet (25 mg total) by mouth 3 (three) times daily. Joshua Debby CROME, MD  Active   omeprazole (PRILOSEC) 20 MG capsule [id number]  Take 20 mg by mouth every morning. [provider]  Active   rosuvastatin  (CRESTOR ) 40 MG tablet [id number]  Take 1 tablet (40 mg total) by mouth daily. Joshua Debby CROME, MD  Active   tirzepatide  (MOUNJARO ) 12.5 MG/0.5ML Pen [id number]  Inject 12.5 mg into the skin once  a week. Joshua Debby CROME, MD  Active   URSODIOL PO [id number]  Take 250 mg by mouth daily in the afternoon. [provider]  Active   valsartan -hydrochlorothiazide  (DIOVAN -HCT) 320-25 MG tablet [id number] Yes Take 1 tablet by mouth daily. Joshua Debby CROME, MD  Active               Assessment/Plan:    Hypertension: - Currently controlled in office today, BP goal <130/80.  - Recommend to continue current regimen - BMP today  Follow Up Plan: Await lab results  Darrelyn Drum, PharmD, BCPS, CPP Clinical Pharmacist  Practitioner [facility] [phone]

## 2023-11-08 ENCOUNTER — Encounter: Payer: Self-pay | Admitting: Internal Medicine

## 2023-11-08 ENCOUNTER — Ambulatory Visit: Payer: Self-pay | Admitting: Pharmacist

## 2023-11-09 ENCOUNTER — Telehealth: Payer: Self-pay

## 2023-11-09 ENCOUNTER — Encounter: Payer: Self-pay | Admitting: Internal Medicine

## 2023-11-09 NOTE — Telephone Encounter (Signed)
 Faxed surgical clearance form to PCP. Office states his BP is now under better control and requested it be refaxed.

## 2023-11-11 ENCOUNTER — Other Ambulatory Visit (HOSPITAL_COMMUNITY): Payer: Self-pay

## 2023-11-11 ENCOUNTER — Telehealth: Payer: Self-pay

## 2023-11-11 NOTE — Telephone Encounter (Signed)
 Pharmacy Patient Advocate Encounter   Received notification from CoverMyMeds that prior authorization for Dexcom G7 sensor is required/requested.   Insurance verification completed.   The patient is insured through [facility].   Per test claim: PA required; PA submitted to above mentioned insurance via Latent Key/confirmation #/EOC [id number] Status is pending

## 2023-11-11 NOTE — Telephone Encounter (Signed)
 Pharmacy Patient Advocate Encounter  Received notification from [facility] that Prior Authorization for Dexcom G7 Sensors has been DENIED.  Full denial letter will be uploaded to the media tab. See denial reason below.   PA #/Case ID/Reference #: [id number]

## 2023-11-14 NOTE — Telephone Encounter (Signed)
 Received fax clearing patient for surgery

## 2023-11-14 NOTE — Telephone Encounter (Signed)
 Is there anything else we can do for this patient ?

## 2023-11-14 NOTE — Telephone Encounter (Signed)
 Patient has been made aware of the denial via VM.

## 2023-11-14 NOTE — Telephone Encounter (Signed)
 This has been completed and other office has received the fax.

## 2023-11-23 ENCOUNTER — Other Ambulatory Visit: Payer: Self-pay | Admitting: Internal Medicine

## 2023-11-23 DIAGNOSIS — I480 Paroxysmal atrial fibrillation: Secondary | ICD-10-CM

## 2023-11-23 DIAGNOSIS — I5042 Chronic combined systolic (congestive) and diastolic (congestive) heart failure: Secondary | ICD-10-CM

## 2023-11-23 DIAGNOSIS — I10 Essential (primary) hypertension: Secondary | ICD-10-CM

## 2023-11-28 ENCOUNTER — Other Ambulatory Visit: Payer: Self-pay | Admitting: Internal Medicine

## 2023-11-28 DIAGNOSIS — Z794 Long term (current) use of insulin: Secondary | ICD-10-CM

## 2023-11-28 NOTE — Telephone Encounter (Unsigned)
 Copied from CRM #[id number]. Topic: Clinical - Medication Refill >> Nov 28, 2023  2:53 PM Alfonso ORN wrote: Medication: tirzepatide  (MOUNJARO ) 12.5 MG/0.5ML Pen  Has the patient contacted their pharmacy? Yes   This is the patient's preferred pharmacy:  [phone] [address], [place] - [address] [place] [address] Phone: [phone] Fax: [phone]  Is this the correct pharmacy for this prescription? Yes If no, delete pharmacy and type the correct one.   Has the prescription been filled recently? No  Is the patient out of the medication? Yes  Has the patient been seen for an appointment in the last year OR does the patient have an upcoming appointment? Yes  Can we respond through MyChart? Yes

## 2023-11-30 MED ORDER — TIRZEPATIDE 12.5 MG/0.5ML ~~LOC~~ SOAJ: 12.5000 mg | SUBCUTANEOUS | 0 refills | Status: AC

## 2024-01-11 ENCOUNTER — Ambulatory Visit: Admitting: Plastic Surgery

## 2024-01-11 ENCOUNTER — Encounter: Admitting: Internal Medicine

## 2024-01-12 ENCOUNTER — Ambulatory Visit: Admitting: Plastic Surgery

## 2024-01-22 ENCOUNTER — Other Ambulatory Visit: Payer: Self-pay | Admitting: Internal Medicine

## 2024-01-22 DIAGNOSIS — I10 Essential (primary) hypertension: Secondary | ICD-10-CM

## 2024-01-26 ENCOUNTER — Ambulatory Visit: Admitting: Plastic Surgery

## 2024-01-26 VITALS — BP 128/83 | HR 70 | Ht 73.0 in | Wt 235.0 lb

## 2024-01-26 DIAGNOSIS — N62 Hypertrophy of breast: Secondary | ICD-10-CM

## 2024-01-26 NOTE — Progress Notes (Signed)
 Jesse Duncan follows up today regarding significant gynecomastia.  He was evaluated initially back in August but required several clearances including neurology, cardiology, and primary care.  All of these clearances have returned and he has been cleared for surgery.  Will schedule him for bilateral simple mastectomies with free nipple graft.  He has been instructed to stop his aspirin  5 days prior to surgery.  All questions were answered to his satisfaction.  Photographs were obtained today with his consent.  Will schedule him at his request.  30 minutes spent reviewing chart, discussing care with patient, coordinating care, and documenting

## 2024-02-03 ENCOUNTER — Encounter: Payer: Self-pay | Admitting: *Deleted

## 2024-02-03 NOTE — Progress Notes (Signed)
 Jesse Duncan                                          MRN: [id number]   02/03/2024   The [facility] Quality Team Specialist reviewed this patient medical record for the purposes of chart review for care gap closure. The following were reviewed: abstraction for care gap closure-kidney health evaluation for diabetes:eGFR  and uACR.    [facility] Quality Team

## 2024-02-03 NOTE — Progress Notes (Signed)
 Jesse Duncan                                          MRN: [id number]   02/03/2024   The [facility] Quality Team Specialist reviewed this patient medical record for the purposes of chart review for care gap closure. The following were reviewed: chart review for care gap closure-controlling blood pressure.     [facility] Quality Team

## 2024-02-03 NOTE — Progress Notes (Signed)
 Jesse Duncan                                          MRN: [id number]   02/03/2024   The [facility] Quality Team Specialist reviewed this patient medical record for the purposes of chart review for care gap closure. The following were reviewed: abstraction for care gap closure-glycemic status assessment.    [facility] Quality Team

## 2024-02-16 ENCOUNTER — Encounter: Admitting: Plastic Surgery

## 2024-02-27 ENCOUNTER — Ambulatory Visit (HOSPITAL_BASED_OUTPATIENT_CLINIC_OR_DEPARTMENT_OTHER): Admit: 2024-02-27 | Admitting: Plastic Surgery

## 2024-02-27 ENCOUNTER — Encounter (HOSPITAL_BASED_OUTPATIENT_CLINIC_OR_DEPARTMENT_OTHER): Payer: Self-pay

## 2024-03-05 ENCOUNTER — Encounter: Admitting: Plastic Surgery

## 2024-03-19 ENCOUNTER — Encounter: Admitting: Plastic Surgery

## 2024-05-03 ENCOUNTER — Encounter: Admitting: Internal Medicine
